# Patient Record
Sex: Female | Born: 1950 | Race: White | Hispanic: No | Smoking: Former smoker
Health system: Southern US, Community
[De-identification: ages and names within clinical notes are randomized; demographics above are authoritative.]

## PROBLEM LIST (undated history)

## (undated) DIAGNOSIS — I2510 Atherosclerotic heart disease of native coronary artery without angina pectoris: Secondary | ICD-10-CM

## (undated) DIAGNOSIS — I219 Acute myocardial infarction, unspecified: Secondary | ICD-10-CM

## (undated) DIAGNOSIS — C801 Malignant (primary) neoplasm, unspecified: Secondary | ICD-10-CM

## (undated) DIAGNOSIS — D689 Coagulation defect, unspecified: Secondary | ICD-10-CM

## (undated) DIAGNOSIS — I255 Ischemic cardiomyopathy: Secondary | ICD-10-CM

## (undated) DIAGNOSIS — Z800 Family history of malignant neoplasm of digestive organs: Secondary | ICD-10-CM

## (undated) DIAGNOSIS — D649 Anemia, unspecified: Secondary | ICD-10-CM

## (undated) DIAGNOSIS — Z965 Presence of tooth-root and mandibular implants: Secondary | ICD-10-CM

## (undated) DIAGNOSIS — K8020 Calculus of gallbladder without cholecystitis without obstruction: Secondary | ICD-10-CM

## (undated) DIAGNOSIS — Z720 Tobacco use: Secondary | ICD-10-CM

## (undated) DIAGNOSIS — M1990 Unspecified osteoarthritis, unspecified site: Secondary | ICD-10-CM

## (undated) DIAGNOSIS — F419 Anxiety disorder, unspecified: Secondary | ICD-10-CM

## (undated) DIAGNOSIS — F32A Depression, unspecified: Secondary | ICD-10-CM

## (undated) DIAGNOSIS — K5790 Diverticulosis of intestine, part unspecified, without perforation or abscess without bleeding: Secondary | ICD-10-CM

## (undated) DIAGNOSIS — Z808 Family history of malignant neoplasm of other organs or systems: Secondary | ICD-10-CM

## (undated) DIAGNOSIS — F329 Major depressive disorder, single episode, unspecified: Secondary | ICD-10-CM

## (undated) DIAGNOSIS — J189 Pneumonia, unspecified organism: Secondary | ICD-10-CM

## (undated) DIAGNOSIS — Z973 Presence of spectacles and contact lenses: Secondary | ICD-10-CM

## (undated) HISTORY — DX: Acute myocardial infarction, unspecified: I21.9

## (undated) HISTORY — DX: Family history of malignant neoplasm of digestive organs: Z80.0

## (undated) HISTORY — DX: Major depressive disorder, single episode, unspecified: F32.9

## (undated) HISTORY — DX: Depression, unspecified: F32.A

## (undated) HISTORY — PX: CHOLECYSTECTOMY: SHX55

## (undated) HISTORY — DX: Anemia, unspecified: D64.9

## (undated) HISTORY — DX: Pneumonia, unspecified organism: J18.9

## (undated) HISTORY — DX: Calculus of gallbladder without cholecystitis without obstruction: K80.20

## (undated) HISTORY — DX: Coagulation defect, unspecified: D68.9

## (undated) HISTORY — DX: Ischemic cardiomyopathy: I25.5

## (undated) HISTORY — DX: Malignant (primary) neoplasm, unspecified: C80.1

## (undated) HISTORY — DX: Diverticulosis of intestine, part unspecified, without perforation or abscess without bleeding: K57.90

## (undated) HISTORY — DX: Atherosclerotic heart disease of native coronary artery without angina pectoris: I25.10

## (undated) HISTORY — DX: Family history of malignant neoplasm of other organs or systems: Z80.8

---

## 1997-07-28 ENCOUNTER — Ambulatory Visit (HOSPITAL_COMMUNITY): Admission: RE | Admit: 1997-07-28 | Discharge: 1997-07-29 | Payer: Self-pay | Admitting: *Deleted

## 2003-03-02 ENCOUNTER — Other Ambulatory Visit: Admission: RE | Admit: 2003-03-02 | Discharge: 2003-03-02 | Payer: Self-pay | Admitting: Family Medicine

## 2003-06-08 ENCOUNTER — Observation Stay (HOSPITAL_COMMUNITY): Admission: EM | Admit: 2003-06-08 | Discharge: 2003-06-09 | Payer: Self-pay | Admitting: *Deleted

## 2006-05-14 ENCOUNTER — Other Ambulatory Visit: Admission: RE | Admit: 2006-05-14 | Discharge: 2006-05-14 | Payer: Self-pay | Admitting: Family Medicine

## 2006-08-12 ENCOUNTER — Emergency Department (HOSPITAL_COMMUNITY): Admission: EM | Admit: 2006-08-12 | Discharge: 2006-08-12 | Payer: Self-pay | Admitting: Emergency Medicine

## 2010-08-06 ENCOUNTER — Emergency Department (HOSPITAL_COMMUNITY): Payer: 59

## 2010-08-06 ENCOUNTER — Emergency Department (HOSPITAL_COMMUNITY)
Admission: EM | Admit: 2010-08-06 | Discharge: 2010-08-06 | Disposition: A | Discharge status: Home / Self Care | Payer: 59 | Attending: Emergency Medicine | Admitting: Emergency Medicine

## 2010-08-06 DIAGNOSIS — R000 Tachycardia, unspecified: Secondary | ICD-10-CM | POA: Insufficient documentation

## 2010-08-06 DIAGNOSIS — R110 Nausea: Secondary | ICD-10-CM | POA: Insufficient documentation

## 2010-08-06 DIAGNOSIS — F17200 Nicotine dependence, unspecified, uncomplicated: Secondary | ICD-10-CM | POA: Insufficient documentation

## 2010-08-06 DIAGNOSIS — M549 Dorsalgia, unspecified: Secondary | ICD-10-CM | POA: Insufficient documentation

## 2010-08-06 LAB — URINE MICROSCOPIC-ADD ON

## 2010-08-06 LAB — URINALYSIS, ROUTINE W REFLEX MICROSCOPIC
Ketones, ur: NEGATIVE mg/dL
Leukocytes, UA: NEGATIVE
Nitrite: NEGATIVE
Specific Gravity, Urine: 1.013 (ref 1.005–1.030)
pH: 5 (ref 5.0–8.0)

## 2010-10-02 ENCOUNTER — Other Ambulatory Visit: Payer: Self-pay | Admitting: Family Medicine

## 2010-10-02 ENCOUNTER — Other Ambulatory Visit (HOSPITAL_COMMUNITY)
Admission: RE | Admit: 2010-10-02 | Discharge: 2010-10-02 | Disposition: A | Discharge status: Home / Self Care | Payer: 59 | Source: Ambulatory Visit | Attending: Family Medicine | Admitting: Family Medicine

## 2010-10-02 DIAGNOSIS — Z124 Encounter for screening for malignant neoplasm of cervix: Secondary | ICD-10-CM | POA: Insufficient documentation

## 2010-10-15 ENCOUNTER — Other Ambulatory Visit: Payer: Self-pay | Admitting: Family Medicine

## 2010-10-15 DIAGNOSIS — Z1231 Encounter for screening mammogram for malignant neoplasm of breast: Secondary | ICD-10-CM

## 2010-10-23 ENCOUNTER — Ambulatory Visit
Admission: RE | Admit: 2010-10-23 | Discharge: 2010-10-23 | Disposition: A | Discharge status: Home / Self Care | Payer: 59 | Source: Ambulatory Visit | Attending: Family Medicine | Admitting: Family Medicine

## 2010-10-23 DIAGNOSIS — Z1231 Encounter for screening mammogram for malignant neoplasm of breast: Secondary | ICD-10-CM

## 2013-08-11 ENCOUNTER — Other Ambulatory Visit: Payer: Self-pay | Admitting: Physician Assistant

## 2014-06-10 ENCOUNTER — Encounter (HOSPITAL_COMMUNITY): Payer: Self-pay | Admitting: Emergency Medicine

## 2014-06-10 ENCOUNTER — Emergency Department (HOSPITAL_COMMUNITY)
Admission: EM | Admit: 2014-06-10 | Discharge: 2014-06-10 | Disposition: A | Discharge status: Home / Self Care | Payer: BLUE CROSS/BLUE SHIELD | Attending: Emergency Medicine | Admitting: Emergency Medicine

## 2014-06-10 DIAGNOSIS — R404 Transient alteration of awareness: Secondary | ICD-10-CM | POA: Diagnosis not present

## 2014-06-10 DIAGNOSIS — Z720 Tobacco use: Secondary | ICD-10-CM | POA: Insufficient documentation

## 2014-06-10 DIAGNOSIS — R55 Syncope and collapse: Secondary | ICD-10-CM | POA: Diagnosis not present

## 2014-06-10 DIAGNOSIS — F419 Anxiety disorder, unspecified: Secondary | ICD-10-CM | POA: Insufficient documentation

## 2014-06-10 DIAGNOSIS — R42 Dizziness and giddiness: Secondary | ICD-10-CM | POA: Diagnosis not present

## 2014-06-10 DIAGNOSIS — Z79899 Other long term (current) drug therapy: Secondary | ICD-10-CM | POA: Insufficient documentation

## 2014-06-10 DIAGNOSIS — R110 Nausea: Secondary | ICD-10-CM | POA: Insufficient documentation

## 2014-06-10 HISTORY — DX: Anxiety disorder, unspecified: F41.9

## 2014-06-10 LAB — BASIC METABOLIC PANEL
Anion gap: 6 (ref 5–15)
BUN: 18 mg/dL (ref 6–23)
CO2: 25 mmol/L (ref 19–32)
Calcium: 9.1 mg/dL (ref 8.4–10.5)
Chloride: 107 mmol/L (ref 96–112)
Creatinine, Ser: 0.8 mg/dL (ref 0.50–1.10)
GFR calc Af Amer: 88 mL/min — ABNORMAL LOW (ref >90)
GFR calc non Af Amer: 76 mL/min — ABNORMAL LOW (ref >90)
Glucose, Bld: 136 mg/dL — ABNORMAL HIGH (ref 70–99)
Potassium: 3.6 mmol/L (ref 3.5–5.1)
Sodium: 138 mmol/L (ref 135–145)

## 2014-06-10 LAB — CBC WITH DIFFERENTIAL/PLATELET
Basophils Absolute: 0.1 10*3/uL (ref 0.0–0.1)
Basophils Relative: 1 % (ref 0–1)
Eosinophils Absolute: 0 10*3/uL (ref 0.0–0.7)
Eosinophils Relative: 0 % (ref 0–5)
HCT: 44.3 % (ref 36.0–46.0)
Hemoglobin: 15.3 g/dL — ABNORMAL HIGH (ref 12.0–15.0)
Lymphocytes Relative: 20 % (ref 12–46)
Lymphs Abs: 1.3 10*3/uL (ref 0.7–4.0)
MCH: 33.1 pg (ref 26.0–34.0)
MCHC: 34.5 g/dL (ref 30.0–36.0)
MCV: 95.9 fL (ref 78.0–100.0)
Monocytes Absolute: 0.7 10*3/uL (ref 0.1–1.0)
Monocytes Relative: 10 % (ref 3–12)
Neutro Abs: 4.8 10*3/uL (ref 1.7–7.7)
Neutrophils Relative %: 69 % (ref 43–77)
Platelets: 233 10*3/uL (ref 150–400)
RBC: 4.62 MIL/uL (ref 3.87–5.11)
RDW: 13 % (ref 11.5–15.5)
WBC: 6.8 10*3/uL (ref 4.0–10.5)

## 2014-06-10 LAB — TROPONIN I: Troponin I: <0.03 ng/mL (ref <0.031)

## 2014-06-10 MED ORDER — SODIUM CHLORIDE 0.9 % IV BOLUS (SEPSIS)
1000.0000 mL | Freq: Once | INTRAVENOUS | Status: AC
  Administered 2014-06-10: 1000 mL via INTRAVENOUS

## 2014-06-10 NOTE — ED Notes (Signed)
Dr. Kohut at bedside 

## 2014-06-10 NOTE — ED Provider Notes (Signed)
CSN: [id number]     Arrival date & time 06/10/14  1044 History   First MD Initiated Contact with Patient 06/10/14 1046     Chief Complaint  Patient presents with  . Near Syncope     (Consider location/radiation/quality/duration/timing/severity/associated sxs/prior Treatment) HPI   63yF with near syncope. Onset just before arrival. Sitting down at desk at work when began to feel nauseated and that she might faint. Laid her head down and then felt flushed and then cold/clammy. Reports that co-worker reports she looked "ashen." Denies any pain during this. Symptoms slowly subsided over a few minutes. Now no complaints. Felt fine earlier in the day. Hx of anxiety but says this episode different. No symptoms with exertion over last several days.   Past Medical History  Diagnosis Date  . Anxiety    Past Surgical History  Procedure Laterality Date  . Cholecystectomy     No family history on file. History  Substance Use Topics  . Smoking status: Current Every Day Smoker -- 1.00 packs/day    Types: Cigarettes  . Smokeless tobacco: Not on file  . Alcohol Use: Yes     Comment: occasional   OB History    No data available     Review of Systems  All systems reviewed and negative, other than as noted in HPI.   Allergies  Review of patient's allergies indicates no known allergies.  Home Medications   Prior to Admission medications   Medication Sig Start Date End Date Taking? Authorizing Provider  FLUoxetine (PROZAC) 10 MG capsule Take 10 mg by mouth daily.   Yes Historical Provider, MD   BP 113/51 mmHg  Pulse 77  Temp(Src) 98.6 F (37 C) (Oral)  Resp 18  SpO2 96% Physical Exam  Constitutional: She is oriented to person, place, and time. She appears well-developed and well-nourished. No distress.  HENT:  Head: Normocephalic and atraumatic.  Eyes: Conjunctivae are normal. Right eye exhibits no discharge. Left eye exhibits no discharge.  Neck: Neck supple.  Cardiovascular:  Normal rate, regular rhythm and normal heart sounds.  Exam reveals no gallop and no friction rub.   No murmur heard. Pulmonary/Chest: Effort normal and breath sounds normal. No respiratory distress.  Abdominal: Soft. She exhibits no distension. There is no tenderness.  Musculoskeletal: She exhibits no edema or tenderness.  Lower extremities symmetric as compared to each other. No calf tenderness. Negative Homan's. No palpable cords.   Neurological: She is alert and oriented to person, place, and time. No cranial nerve deficit. She exhibits normal muscle tone. Coordination normal.  Good finger to nose b/l. Gait steady.   Skin: Skin is warm and dry.  Psychiatric: She has a normal mood and affect. Her behavior is normal. Thought content normal.  Nursing note and vitals reviewed.   ED Course  Procedures (including critical care time) Labs Review Labs Reviewed - No data to display  Imaging Review No results found.   EKG Interpretation   Date/Time:  Friday June 10 2014 10:45:08 EST Ventricular Rate:  75 PR Interval:  123 QRS Duration: 85 QT Interval:  379 QTC Calculation: 423 R Axis:   52 Text Interpretation:  Sinus arrhythmia No significant change since last  tracing Confirmed by Wilson Singer  MD, Keller ([id number]) on 06/10/2014 12:09:59 PM      MDM   Final diagnoses:  Near syncope    63yF with near syncopal symptoms. Now symptom free. Consistent with vasovagal. W/u fairly unrevealing. Low suspicion for emergent process.  Virgel Manifold, MD 06/16/14 [phone]

## 2014-06-10 NOTE — ED Notes (Signed)
Pt is stable upon d/c and ambulates from ED escorted by ED staff.

## 2014-06-10 NOTE — ED Notes (Signed)
Pt states she has been having waves of lightheadedness. Pt states she is currently having some lightheadedness.

## 2014-06-10 NOTE — ED Notes (Signed)
Pt arrived from work by [employer] with c/o near syncopal episode. Pt stated that she started feeling faint while sitting at desk and had some numbness to toes and left shoulder pain. Pt was also nauseated, clammy, and hot feeling. Pt stated that she has been having some stress at work and takes Prozac for anxiety. EMS initial BP 174/99 HR100 Last BP 130/78 HR-84 Currently symptoms have resolved.

## 2014-06-10 NOTE — Discharge Instructions (Signed)
Near-Syncope Near-syncope (commonly known as near fainting) is sudden weakness, dizziness, or feeling like you might pass out. During an episode of near-syncope, you may also develop pale skin, have tunnel vision, or feel sick to your stomach (nauseous). Near-syncope may occur when getting up after sitting or while standing for a long time. It is caused by a sudden decrease in blood flow to the brain. This decrease can result from various causes or triggers, most of which are not serious. However, because near-syncope can sometimes be a sign of something serious, a medical evaluation is required. The specific cause is often not determined. HOME CARE INSTRUCTIONS  Monitor your condition for any changes. The following actions may help to alleviate any discomfort you are experiencing:  Have someone stay with you until you feel stable.  Lie down right away and prop your feet up if you start feeling like you might faint. Breathe deeply and steadily. Wait until all the symptoms have passed. Most of these episodes last only a few minutes. You may feel tired for several hours.   Drink enough fluids to keep your urine clear or pale yellow.   If you are taking blood pressure or heart medicine, get up slowly when seated or lying down. Take several minutes to sit and then stand. This can reduce dizziness.  Follow up with your health care provider as directed. SEEK IMMEDIATE MEDICAL CARE IF:   You have a severe headache.   You have unusual pain in the chest, abdomen, or back.   You are bleeding from the mouth or rectum, or you have black or tarry stool.   You have an irregular or very fast heartbeat.   You have repeated fainting or have seizure-like jerking during an episode.   You faint when sitting or lying down.   You have confusion.   You have difficulty walking.   You have severe weakness.   You have vision problems.  MAKE SURE YOU:   Understand these instructions.  Will  watch your condition.  Will get help right away if you are not doing well or get worse. Document Released: 04/08/2005 Document Revised: 04/13/2013 Document Reviewed: 09/11/2012 ExitCare Patient Information 2015 [employer]. This information is not intended to replace advice given to you by your health care provider. Make sure you discuss any questions you have with your health care provider.  

## 2015-04-28 DIAGNOSIS — J029 Acute pharyngitis, unspecified: Secondary | ICD-10-CM | POA: Diagnosis not present

## 2015-04-28 DIAGNOSIS — H6591 Unspecified nonsuppurative otitis media, right ear: Secondary | ICD-10-CM | POA: Diagnosis not present

## 2015-08-31 DIAGNOSIS — M25551 Pain in right hip: Secondary | ICD-10-CM | POA: Diagnosis not present

## 2015-08-31 DIAGNOSIS — M461 Sacroiliitis, not elsewhere classified: Secondary | ICD-10-CM | POA: Diagnosis not present

## 2015-08-31 DIAGNOSIS — E559 Vitamin D deficiency, unspecified: Secondary | ICD-10-CM | POA: Diagnosis not present

## 2015-08-31 DIAGNOSIS — M5431 Sciatica, right side: Secondary | ICD-10-CM | POA: Diagnosis not present

## 2015-08-31 DIAGNOSIS — E538 Deficiency of other specified B group vitamins: Secondary | ICD-10-CM | POA: Diagnosis not present

## 2015-09-01 DIAGNOSIS — H3540 Unspecified peripheral retinal degeneration: Secondary | ICD-10-CM | POA: Diagnosis not present

## 2015-09-01 DIAGNOSIS — H2513 Age-related nuclear cataract, bilateral: Secondary | ICD-10-CM | POA: Diagnosis not present

## 2016-06-11 ENCOUNTER — Ambulatory Visit
Admission: RE | Admit: 2016-06-11 | Discharge: 2016-06-11 | Disposition: A | Discharge status: Home / Self Care | Payer: Medicare Other | Source: Ambulatory Visit | Attending: Sports Medicine | Admitting: Sports Medicine

## 2016-06-11 ENCOUNTER — Ambulatory Visit (INDEPENDENT_AMBULATORY_CARE_PROVIDER_SITE_OTHER): Payer: Medicare Other | Admitting: Sports Medicine

## 2016-06-11 VITALS — BP 119/64 | Ht 64.5 in | Wt 165.0 lb

## 2016-06-11 DIAGNOSIS — M5442 Lumbago with sciatica, left side: Secondary | ICD-10-CM

## 2016-06-11 DIAGNOSIS — M5136 Other intervertebral disc degeneration, lumbar region: Secondary | ICD-10-CM | POA: Diagnosis not present

## 2016-06-11 DIAGNOSIS — M47816 Spondylosis without myelopathy or radiculopathy, lumbar region: Secondary | ICD-10-CM | POA: Diagnosis not present

## 2016-06-11 MED ORDER — AMITRIPTYLINE HCL 25 MG PO TABS: 25.0000 mg | ORAL_TABLET | Freq: Every day | ORAL | 1 refills | Status: DC

## 2016-06-11 NOTE — Progress Notes (Signed)
  MARDEAN MAYERSON - 66 y.o. female MRN [id number]  Date of birth: 1951-03-24  SUBJECTIVE:  Including CC & ROS.  No chief complaint on file.  Ms. Felipe is a 66 yo F w/ questionable PMH of L. Sided sciatica in for evaluation of R. Hip pain on referral from Dr. Yaakov Guthrie. Describes intermittent 8/10 "dull ache" of lateral hip radiating into groin of 3 months duration. Denies inciting event or trauma. Reports worsening of symptoms when rising from chair or with exercise. Pain improved w/ NSAIDs. Reports associated weakness to hip flexion w/ periodic "tingling/numbness" over R. Knee.   Pt denies h/o arthritis in other joints.    HISTORY: Past Medical, Surgical, Social, and Family History Reviewed & Updated per EMR.   Pertinent Historical Findings include: R. Sided sciatica Cervical Spine DDD Osteopenia  DATA REVIEWED: XR R. Hip (5/17): mild degenerative changes w/o acute pathology  PHYSICAL EXAM:  VS: BP:119/64  HR: bpm  TEMP: ( )  RESP:   HT:5' 4.5" (163.8 cm)   WT:165 lb (74.8 kg)  BMI:27.9 PHYSICAL EXAM:  General: well-appearing F in NAD, A&Ox3  MSK: - Full/non-painful range of motion to hip flexion/extension/internal rotation/external rotation, leg flexion/extension - no pain when loading and internally rotating L. hip - 5/5 strength to hip flexion/extension, leg flexion/extension b/l - 4/5 strength to hip abduction b/l - 2+ patellar reflexes b/l - Negative SLR - nl pelvic tilt, negative FABER  ASSESSMENT & PLAN: See problem based charting & AVS for pt instructions. 66 yo w/ PMH of r. Sided sciatica in with R. Hip/groin pain w/ radicular symptoms and signs of abductor weakness on exam concerning for L. Sided sciatica. Previous h/o sciatica concerning for DDD w/o recent imaging. Will manage radicular symptoms now and get imaging today.  - AP/Lateral XR lumbar spine - Amitriptyline 25mg  once nightly - Home exercises including hip abduction, knee-to-chest, and bridges - F/U  in 1 month

## 2016-06-14 DIAGNOSIS — M5136 Other intervertebral disc degeneration, lumbar region: Secondary | ICD-10-CM | POA: Insufficient documentation

## 2016-07-25 ENCOUNTER — Encounter: Payer: Self-pay | Admitting: Sports Medicine

## 2016-07-25 ENCOUNTER — Ambulatory Visit (INDEPENDENT_AMBULATORY_CARE_PROVIDER_SITE_OTHER): Payer: Medicare Other | Admitting: Sports Medicine

## 2016-07-25 DIAGNOSIS — M5136 Other intervertebral disc degeneration, lumbar region: Secondary | ICD-10-CM | POA: Diagnosis present

## 2016-07-25 NOTE — Progress Notes (Signed)
[name] Progress Note  Subjective:  Carol Ferguson is a 66 y.o. female who presents for follow-up of back pain. She was seen 06/11/16 at [facility] and thought to have R hip and groin neuropathic pain with bilateral hip abductor weakness, concerning for DDD with sciatica. Lumbar spine x-ray showed degenerative changes with disc and facet disease, especially L1-L3, exaggerated lordosis, and spurring. Since that visit, pain has improved a lot. She took amitriptyline 25 mg QHS for 2 weeks which helped "electric shock" feeling into groin dramatically--now only has sensation every once in a while rather than multiple times daily. She stopped taking this, however, because it made her feel a little "off balance" in the morning. She has been doing home hip abduction exercises about 3x per week.  She is taking naproxen only about 2x a week, compared to daily previously. She does complain that back feels like it "catches" when trying to straighten up after exerting herself more, e.g., spring cleaning.   ROS: No bowel or bladder incontinence, no falls  No Known Allergies  Objective: BP (!) 112/44   Ht 5\' 5"  (1.651 m)   Wt 164 lb (74.4 kg)   BMI 27.29 kg/m   Constitutional: Older female in NAD Pulmonary/Chest: No respiratory distress.  Musculoskeletal: FROM with hip inversion and eversion bilaterally. No midline spinal TTP. Negative FABER.  Neurological: Sensation intact. Hip abduction strength 5/5 bilaterally (improved from last visit). 5/5 strength with leg extension.  Psychiatric: Normal mood and affect.  Vitals reviewed  Assessment/Plan: Lumbar degenerative disc disease - Improved pain since last visit, suspect 2/2 HEP. Anticipate further improvement if continues and increases frequency of exercises, at least 3x a week for hip and back exercises.  - Recommended adding pelvic tip, knee-to-chest, knee-to-opposite-shoulder, and sit ups to her regimen - Recommended incorporating daily  exercise like walking. - Consider taichi, yoga, or pilates for low back flexibility - Take amitriptyline and naproxen prn  Follow-up in about 6 weeks to assess for improvement. Could discuss tracking activity with fitbit at that time.   Olene Floss, MD [facility], PGY-2  I observed and examined the patient with the resident and agree with assessment and plan.  Note reviewed and modified by me. Stefanie Libel, MD

## 2016-07-25 NOTE — Assessment & Plan Note (Addendum)
-   Improved pain since last visit, suspect 2/2 HEP. Anticipate further improvement if continues and increases frequency of exercises, at least 3x a week for hip and back exercises.  - Recommended adding pelvic tip, knee-to-chest, knee-to-opposite-shoulder, and sit ups to her regimen - Recommended incorporating daily exercise like walking. - Consider taichi, yoga, or pilates for low back flexibility - Take amitriptyline and naproxen prn

## 2016-08-14 ENCOUNTER — Other Ambulatory Visit: Payer: Self-pay | Admitting: *Deleted

## 2016-08-14 DIAGNOSIS — M5442 Lumbago with sciatica, left side: Secondary | ICD-10-CM

## 2016-08-14 MED ORDER — AMITRIPTYLINE HCL 25 MG PO TABS: 25.0000 mg | ORAL_TABLET | Freq: Every day | ORAL | 1 refills | Status: DC

## 2016-09-26 ENCOUNTER — Inpatient Hospital Stay (HOSPITAL_COMMUNITY)
Admission: EM | Admit: 2016-09-26 | Discharge: 2016-09-28 | DRG: 246 | Disposition: A | Discharge status: Home / Self Care | Payer: Medicare Other | Attending: Interventional Cardiology | Admitting: Interventional Cardiology

## 2016-09-26 ENCOUNTER — Encounter (HOSPITAL_COMMUNITY): Payer: Self-pay | Admitting: Emergency Medicine

## 2016-09-26 ENCOUNTER — Encounter (HOSPITAL_COMMUNITY)
Admission: EM | Disposition: A | Discharge status: Home / Self Care | Payer: Self-pay | Source: Home / Self Care | Attending: Interventional Cardiology

## 2016-09-26 ENCOUNTER — Ambulatory Visit (HOSPITAL_COMMUNITY): Admit: 2016-09-26 | Payer: Medicare Other | Admitting: Interventional Cardiology

## 2016-09-26 DIAGNOSIS — I2510 Atherosclerotic heart disease of native coronary artery without angina pectoris: Secondary | ICD-10-CM | POA: Diagnosis not present

## 2016-09-26 DIAGNOSIS — I2129 ST elevation (STEMI) myocardial infarction involving other sites: Secondary | ICD-10-CM | POA: Diagnosis present

## 2016-09-26 DIAGNOSIS — Z720 Tobacco use: Secondary | ICD-10-CM

## 2016-09-26 DIAGNOSIS — I5031 Acute diastolic (congestive) heart failure: Secondary | ICD-10-CM | POA: Diagnosis present

## 2016-09-26 DIAGNOSIS — I2119 ST elevation (STEMI) myocardial infarction involving other coronary artery of inferior wall: Secondary | ICD-10-CM | POA: Diagnosis not present

## 2016-09-26 DIAGNOSIS — M25519 Pain in unspecified shoulder: Secondary | ICD-10-CM | POA: Diagnosis not present

## 2016-09-26 DIAGNOSIS — F17210 Nicotine dependence, cigarettes, uncomplicated: Secondary | ICD-10-CM | POA: Diagnosis not present

## 2016-09-26 DIAGNOSIS — R079 Chest pain, unspecified: Secondary | ICD-10-CM | POA: Diagnosis not present

## 2016-09-26 DIAGNOSIS — F419 Anxiety disorder, unspecified: Secondary | ICD-10-CM | POA: Diagnosis present

## 2016-09-26 DIAGNOSIS — E785 Hyperlipidemia, unspecified: Secondary | ICD-10-CM

## 2016-09-26 DIAGNOSIS — Z23 Encounter for immunization: Secondary | ICD-10-CM | POA: Diagnosis not present

## 2016-09-26 DIAGNOSIS — Z8249 Family history of ischemic heart disease and other diseases of the circulatory system: Secondary | ICD-10-CM | POA: Diagnosis not present

## 2016-09-26 DIAGNOSIS — Z9049 Acquired absence of other specified parts of digestive tract: Secondary | ICD-10-CM

## 2016-09-26 DIAGNOSIS — I2121 ST elevation (STEMI) myocardial infarction involving left circumflex coronary artery: Secondary | ICD-10-CM | POA: Diagnosis not present

## 2016-09-26 DIAGNOSIS — I5032 Chronic diastolic (congestive) heart failure: Secondary | ICD-10-CM

## 2016-09-26 DIAGNOSIS — I252 Old myocardial infarction: Secondary | ICD-10-CM

## 2016-09-26 DIAGNOSIS — M5136 Other intervertebral disc degeneration, lumbar region: Secondary | ICD-10-CM | POA: Diagnosis present

## 2016-09-26 DIAGNOSIS — I219 Acute myocardial infarction, unspecified: Secondary | ICD-10-CM

## 2016-09-26 DIAGNOSIS — Z955 Presence of coronary angioplasty implant and graft: Secondary | ICD-10-CM

## 2016-09-26 DIAGNOSIS — R52 Pain, unspecified: Secondary | ICD-10-CM | POA: Diagnosis not present

## 2016-09-26 DIAGNOSIS — I213 ST elevation (STEMI) myocardial infarction of unspecified site: Secondary | ICD-10-CM | POA: Diagnosis not present

## 2016-09-26 HISTORY — PX: LEFT HEART CATH AND CORONARY ANGIOGRAPHY: CATH118249

## 2016-09-26 HISTORY — DX: Acute myocardial infarction, unspecified: I21.9

## 2016-09-26 HISTORY — PX: CORONARY STENT INTERVENTION: CATH118234

## 2016-09-26 HISTORY — DX: Tobacco use: Z72.0

## 2016-09-26 LAB — CBC
HCT: 43.9 % (ref 36.0–46.0)
HEMOGLOBIN: 15 g/dL (ref 12.0–15.0)
MCH: 33.4 pg (ref 26.0–34.0)
MCHC: 34.2 g/dL (ref 30.0–36.0)
MCV: 97.8 fL (ref 78.0–100.0)
Platelets: 193 10*3/uL (ref 150–400)
RBC: 4.49 MIL/uL (ref 3.87–5.11)
RDW: 12.8 % (ref 11.5–15.5)
WBC: 10 10*3/uL (ref 4.0–10.5)

## 2016-09-26 LAB — CREATININE, SERUM
CREATININE: 0.83 mg/dL (ref 0.44–1.00)
GFR calc non Af Amer: >60 mL/min (ref >60)

## 2016-09-26 LAB — POCT ACTIVATED CLOTTING TIME: ACTIVATED CLOTTING TIME: 362 s

## 2016-09-26 LAB — TSH: TSH: 0.999 u[IU]/mL (ref 0.350–4.500)

## 2016-09-26 LAB — MRSA PCR SCREENING: MRSA by PCR: NEGATIVE

## 2016-09-26 LAB — TROPONIN I
TROPONIN I: 34.78 ng/mL — AB (ref <0.03)
Troponin I: 2.35 ng/mL (ref <0.03)

## 2016-09-26 SURGERY — LEFT HEART CATH AND CORONARY ANGIOGRAPHY
Anesthesia: LOCAL

## 2016-09-26 MED ORDER — ACETAMINOPHEN 325 MG PO TABS: 650.0000 mg | ORAL_TABLET | ORAL | Status: DC | PRN

## 2016-09-26 MED ORDER — NITROGLYCERIN 1 MG/10 ML FOR IR/CATH LAB
INTRA_ARTERIAL | Status: AC
  Filled 2016-09-26: qty 10

## 2016-09-26 MED ORDER — FENTANYL CITRATE (PF) 100 MCG/2ML IJ SOLN
INTRAMUSCULAR | Status: AC
  Filled 2016-09-26: qty 2

## 2016-09-26 MED ORDER — HEPARIN SODIUM (PORCINE) 1000 UNIT/ML IJ SOLN
INTRAMUSCULAR | Status: AC
  Filled 2016-09-26: qty 1

## 2016-09-26 MED ORDER — ASPIRIN 81 MG PO CHEW
81.0000 mg | CHEWABLE_TABLET | Freq: Every day | ORAL | Status: DC
  Administered 2016-09-26 – 2016-09-28 (×3): 81 mg via ORAL
  Filled 2016-09-26 (×3): qty 1

## 2016-09-26 MED ORDER — ONDANSETRON HCL 4 MG/2ML IJ SOLN: 4.0000 mg | Freq: Four times a day (QID) | INTRAMUSCULAR | Status: DC | PRN

## 2016-09-26 MED ORDER — IOPAMIDOL (ISOVUE-370) INJECTION 76%
INTRAVENOUS | Status: AC
  Filled 2016-09-26: qty 100

## 2016-09-26 MED ORDER — TIROFIBAN HCL IN NACL 5-0.9 MG/100ML-% IV SOLN
INTRAVENOUS | Status: AC
  Filled 2016-09-26: qty 100

## 2016-09-26 MED ORDER — NITROGLYCERIN 0.4 MG SL SUBL: 0.4000 mg | SUBLINGUAL_TABLET | SUBLINGUAL | Status: DC | PRN

## 2016-09-26 MED ORDER — TICAGRELOR 90 MG PO TABS
ORAL_TABLET | ORAL | Status: AC
  Filled 2016-09-26: qty 2

## 2016-09-26 MED ORDER — SODIUM CHLORIDE 0.9 % IV SOLN: 250.0000 mL | INTRAVENOUS | Status: DC | PRN

## 2016-09-26 MED ORDER — PNEUMOCOCCAL VAC POLYVALENT 25 MCG/0.5ML IJ INJ
0.5000 mL | INJECTION | INTRAMUSCULAR | Status: AC
  Administered 2016-09-28: 0.5 mL via INTRAMUSCULAR
  Filled 2016-09-26: qty 0.5

## 2016-09-26 MED ORDER — ATORVASTATIN CALCIUM 80 MG PO TABS
80.0000 mg | ORAL_TABLET | Freq: Every day | ORAL | Status: DC
  Administered 2016-09-26: 80 mg via ORAL
  Filled 2016-09-26: qty 1

## 2016-09-26 MED ORDER — ALPRAZOLAM 0.5 MG PO TABS: 1.0000 mg | ORAL_TABLET | Freq: Two times a day (BID) | ORAL | Status: DC | PRN

## 2016-09-26 MED ORDER — SODIUM CHLORIDE 0.9 % IV SOLN
INTRAVENOUS | Status: AC | PRN
  Administered 2016-09-26: 200 mL/h via INTRAVENOUS
  Administered 2016-09-26: 250 mL

## 2016-09-26 MED ORDER — IOPAMIDOL (ISOVUE-370) INJECTION 76%
INTRAVENOUS | Status: AC
  Filled 2016-09-26: qty 50

## 2016-09-26 MED ORDER — SODIUM CHLORIDE 0.9% FLUSH: 3.0000 mL | INTRAVENOUS | Status: DC | PRN

## 2016-09-26 MED ORDER — HEPARIN SODIUM (PORCINE) 5000 UNIT/ML IJ SOLN
5000.0000 [IU] | Freq: Three times a day (TID) | INTRAMUSCULAR | Status: DC
  Administered 2016-09-26 – 2016-09-28 (×5): 5000 [IU] via SUBCUTANEOUS
  Filled 2016-09-26 (×4): qty 1

## 2016-09-26 MED ORDER — HEPARIN SODIUM (PORCINE) 5000 UNIT/ML IJ SOLN
4000.0000 [IU] | Freq: Once | INTRAMUSCULAR | Status: AC
  Administered 2016-09-26: 4000 [IU] via INTRAVENOUS

## 2016-09-26 MED ORDER — TIROFIBAN (AGGRASTAT) BOLUS VIA INFUSION
INTRAVENOUS | Status: DC | PRN
  Administered 2016-09-26: 1850 ug via INTRAVENOUS

## 2016-09-26 MED ORDER — TICAGRELOR 90 MG PO TABS
90.0000 mg | ORAL_TABLET | Freq: Two times a day (BID) | ORAL | Status: DC
  Administered 2016-09-26 – 2016-09-28 (×4): 90 mg via ORAL
  Filled 2016-09-26 (×4): qty 1

## 2016-09-26 MED ORDER — MIDAZOLAM HCL 2 MG/2ML IJ SOLN
INTRAMUSCULAR | Status: DC | PRN
  Administered 2016-09-26: 1 mg via INTRAVENOUS

## 2016-09-26 MED ORDER — ASPIRIN EC 81 MG PO TBEC: 81.0000 mg | DELAYED_RELEASE_TABLET | Freq: Every day | ORAL | Status: DC

## 2016-09-26 MED ORDER — LIDOCAINE HCL 1 % IJ SOLN
INTRAMUSCULAR | Status: AC
  Filled 2016-09-26: qty 20

## 2016-09-26 MED ORDER — CARVEDILOL 3.125 MG PO TABS
3.1250 mg | ORAL_TABLET | Freq: Two times a day (BID) | ORAL | Status: DC
  Administered 2016-09-26 – 2016-09-27 (×2): 3.125 mg via ORAL
  Filled 2016-09-26 (×3): qty 1

## 2016-09-26 MED ORDER — LIDOCAINE HCL (PF) 1 % IJ SOLN
INTRAMUSCULAR | Status: DC | PRN
  Administered 2016-09-26: 2 mL via INTRADERMAL

## 2016-09-26 MED ORDER — HEPARIN (PORCINE) IN NACL 2-0.9 UNIT/ML-% IJ SOLN
INTRAMUSCULAR | Status: AC
  Filled 2016-09-26: qty 500

## 2016-09-26 MED ORDER — SODIUM CHLORIDE 0.9% FLUSH
3.0000 mL | Freq: Two times a day (BID) | INTRAVENOUS | Status: DC
  Administered 2016-09-26 – 2016-09-27 (×2): 3 mL via INTRAVENOUS

## 2016-09-26 MED ORDER — VERAPAMIL HCL 2.5 MG/ML IV SOLN
INTRAVENOUS | Status: AC
  Filled 2016-09-26: qty 2

## 2016-09-26 MED ORDER — IOPAMIDOL (ISOVUE-370) INJECTION 76%
INTRAVENOUS | Status: DC | PRN
  Administered 2016-09-26: 110 mL via INTRA_ARTERIAL

## 2016-09-26 MED ORDER — SODIUM CHLORIDE 0.9 % WEIGHT BASED INFUSION: 1.0000 mL/kg/h | INTRAVENOUS | Status: AC

## 2016-09-26 MED ORDER — MIDAZOLAM HCL 2 MG/2ML IJ SOLN
INTRAMUSCULAR | Status: AC
  Filled 2016-09-26: qty 2

## 2016-09-26 MED ORDER — HEPARIN (PORCINE) IN NACL 2-0.9 UNIT/ML-% IJ SOLN
INTRAMUSCULAR | Status: AC | PRN
  Administered 2016-09-26: 1000 mL

## 2016-09-26 MED ORDER — HYDRALAZINE HCL 20 MG/ML IJ SOLN: 5.0000 mg | INTRAMUSCULAR | Status: AC | PRN

## 2016-09-26 MED ORDER — NITROGLYCERIN 1 MG/10 ML FOR IR/CATH LAB
INTRA_ARTERIAL | Status: DC | PRN
  Administered 2016-09-26: 200 ug via INTRACORONARY

## 2016-09-26 MED ORDER — VERAPAMIL HCL 2.5 MG/ML IV SOLN
INTRAVENOUS | Status: DC | PRN
  Administered 2016-09-26: 10 mL via INTRA_ARTERIAL

## 2016-09-26 MED ORDER — LABETALOL HCL 5 MG/ML IV SOLN: 10.0000 mg | INTRAVENOUS | Status: AC | PRN

## 2016-09-26 MED ORDER — TIROFIBAN HCL IN NACL 5-0.9 MG/100ML-% IV SOLN
INTRAVENOUS | Status: DC | PRN
  Administered 2016-09-26: 0.15 ug/kg/min via INTRAVENOUS

## 2016-09-26 MED ORDER — HEPARIN SODIUM (PORCINE) 1000 UNIT/ML IJ SOLN
INTRAMUSCULAR | Status: DC | PRN
Start: 2016-09-26 — End: 2016-09-26
  Administered 2016-09-26: 2000 [IU] via INTRAVENOUS

## 2016-09-26 MED ORDER — TICAGRELOR 90 MG PO TABS
ORAL_TABLET | ORAL | Status: DC | PRN
  Administered 2016-09-26: 180 mg via ORAL

## 2016-09-26 MED ORDER — FENTANYL CITRATE (PF) 100 MCG/2ML IJ SOLN
INTRAMUSCULAR | Status: DC | PRN
  Administered 2016-09-26 (×2): 50 ug via INTRAVENOUS

## 2016-09-26 MED ORDER — TIROFIBAN HCL IN NACL 5-0.9 MG/100ML-% IV SOLN: 0.0750 ug/kg/min | INTRAVENOUS | Status: DC

## 2016-09-26 MED ORDER — SODIUM CHLORIDE 0.9 % IV SOLN
INTRAVENOUS | Status: DC | PRN
  Administered 2016-09-26: 20 mL/h via INTRAVENOUS

## 2016-09-26 MED ORDER — OXYCODONE-ACETAMINOPHEN 5-325 MG PO TABS: 1.0000 | ORAL_TABLET | ORAL | Status: DC | PRN

## 2016-09-26 SURGICAL SUPPLY — 19 items
BALLN EMERGE MR 2.25X12 (BALLOONS) ×2
BALLOON EMERGE MR 2.25X12 (BALLOONS) ×1 IMPLANT
CATH INFINITI 5 FR JL3.5 (CATHETERS) ×2 IMPLANT
CATH INFINITI JR4 5F (CATHETERS) ×2 IMPLANT
CATH VISTA GUIDE 6FR JR4 (CATHETERS) IMPLANT
CATH VISTA GUIDE 6FR XB3 (CATHETERS) ×2 IMPLANT
COVER PRB 48X5XTLSCP FOLD TPE (BAG) ×1 IMPLANT
COVER PROBE 5X48 (BAG) ×1
DEVICE RAD COMP TR BAND LRG (VASCULAR PRODUCTS) ×2 IMPLANT
GLIDESHEATH SLEND A-KIT 6F 22G (SHEATH) ×2 IMPLANT
GUIDEWIRE INQWIRE 1.5J.035X260 (WIRE) ×1 IMPLANT
INQWIRE 1.5J .035X260CM (WIRE) ×2
KIT ENCORE 26 ADVANTAGE (KITS) ×2 IMPLANT
KIT HEART LEFT (KITS) ×2 IMPLANT
PACK CARDIAC CATHETERIZATION (CUSTOM PROCEDURE TRAY) ×2 IMPLANT
STENT RESOLUTE ONYX 2.5X15 (Permanent Stent) ×2 IMPLANT
TRANSDUCER W/STOPCOCK (MISCELLANEOUS) ×2 IMPLANT
TUBING CIL FLEX 10 FLL-RA (TUBING) ×2 IMPLANT
WIRE ASAHI PROWATER 180CM (WIRE) ×2 IMPLANT

## 2016-09-26 NOTE — Progress Notes (Signed)
CRITICAL VALUE ALERT  Critical Value:  Trop 34.78   Date & Time Notied: 6/7 2000  Provider Notified:yes  Orders Received/Actions taken: D/C serial Trop

## 2016-09-26 NOTE — ED Notes (Signed)
Pt from home via [facility] with c/o STEMI.  EMS reports a tight burning between shoulder blades starting last night around 2100 which resolved around 0100.  Pt woke this morning with the pain returned 10/10.  Given 324 mg Aspirin, 600 mL NS, and 2 nitro with decrease in pain to 5/10.  Lungs clear.  Pt was in ED room long enough to give heparin.  No EKG, vitals, or blood drawn while in room.

## 2016-09-26 NOTE — H&P (Signed)
The patient has been seen in conjunction with Carol Bhagat, PAC. All aspects of care have been considered and discussed. The patient has been personally interviewed, examined, and all clinical data has been reviewed.   Met patient in the Emergency ambulance bay. Onset of CP 9 PM , waxing and waning for 2 hours before resolving. Recurred this morning at 6:30 AM upon getting out of bed. In ER c/o severe interscapular pain of burning quality. ECG c/w inferolateral injury current.  No major medical problems but does smoke 1 PPD. On no medications, but has chronic recurrent low back pain.  Moderate bradycardia at 50 bpm, BP 118/75 mmHg. Overall appearance is anxiety and severe ongoing discomfort. Normal exam with equal pulses in all positions bilaterally.. Labs drawn and pending.  Impression is of stuttering inferolateral STEMI with bradycardia.  Plan is emergency cath and mechanical reperfusion if indicated. Cath lab not immediately available. Heparin, aspirin, and IV fluid bolus given in ER   Critical care time 45 minutes  Patient ID: Carol Ferguson MRN: [id number], DOB/AGE: 01-20-1951   Admit date: 09/26/2016   Primary Physician: Patient, No Pcp Per Primary Cardiologist: New to Dr. Tamala Julian  Pt. Profile:  Carol Ferguson is a 66 y.o. female with no PMH except ongoing tobacco abuse for >30 years (smokes 1/2 pack/da)   who presented by EMS with code STEMI.   HPI: As above. Not takes any medications. Family hx of CAD with MI to mother and brother. She started to having indigestion like pain last night around 9 pm radiating to her back/shoulder blade. Resolved. Then intermittently lasted all night. She work up this morning with similar chest discomfort but radiation was worse. Found to have inferior STEMI by EMS-> given ASA 358m and SL nitro x 2. In ER she received heparin bolus. She continued to have burning pain with radiating to her shoulder blade in cath lab.   EKG showed sinus rhythm with  inferior ST elevation with anterior depression.   Problem List  Past Medical History:  Diagnosis Date  . Anxiety   . Tobacco abuse     Past Surgical History:  Procedure Laterality Date  . CHOLECYSTECTOMY    . CORONARY STENT INTERVENTION N/A 09/26/2016   Procedure: Coronary Stent Intervention;  Surgeon: SBelva Crome MD;  [facility];  Service: Cardiovascular;  Laterality: N/A;  . LEFT HEART CATH AND CORONARY ANGIOGRAPHY N/A 09/26/2016   Procedure: Left Heart Cath and Coronary Angiography;  Surgeon: SBelva Crome MD;  [facility];  Service: Cardiovascular;  Laterality: N/A;     Allergies  No Known Allergies   Home Medications  Prior to Admission medications   Medication Sig Start Date End Date Taking? Authorizing Provider  amitriptyline (ELAVIL) 25 MG tablet Take 1 tablet (25 mg total) by mouth at bedtime. 08/14/16   FStefanie Libel MD  FLUoxetine (PROZAC) 10 MG capsule Take 10 mg by mouth daily.    [provider]    Family History  Family History  Problem Relation Age of Onset  . Heart attack Mother   . Heart attack Brother    Family Status  Relation Status  . Mother (Not Specified)  . Brother (Not Specified)    Social History  Social History   Social History  . Marital status: Divorced    Spouse name: N/A  . Number of children: N/A  . Years of education: N/A   Occupational History  . Not on file.  Social History Main Topics  . Smoking status: Current Every Day Smoker    Packs/day: 1.00    Types: Cigarettes  . Smokeless tobacco: Never Used  . Alcohol use Yes     Comment: occasional  . Drug use: No  . Sexual activity: Not on file   Other Topics Concern  . Not on file   Social History Narrative  . No narrative on file     All other systems reviewed and are otherwise negative except as noted above.  Physical Exam  Blood pressure (!) 108/53, pulse 67, temperature 98.5 F (36.9 C), temperature source  Oral, resp. rate 20, weight 160 lb 11.5 oz (72.9 kg), SpO2 97 %.  General: Pleasant, NAD Psych: Normal affect. Neuro: Alert and oriented X 3. Moves all extremities spontaneously. HEENT: Normal  Neck: Supple without bruits or JVD. Lungs:  Resp regular and unlabored, CTA. Heart: RRR no s3, s4, or murmurs. Abdomen: Soft, non-tender, non-distended, BS + x 4.  Extremities: No clubbing, cyanosis or edema. DP/PT/Radials 2+ and equal bilaterally.  Labs   Recent Labs  09/26/16 1253  TROPONINI 2.35*   Lab Results  Component Value Date   WBC 10.0 09/26/2016   HGB 15.0 09/26/2016   HCT 43.9 09/26/2016   MCV 97.8 09/26/2016   PLT 193 09/26/2016  *  ASSESSMENT AND PLAN  1. Inferior ST elevation with anterior depression.   - pending emergent cath.   2. Tobacco smoking   Signed, Belva Crome III, PA-C 09/26/2016, 3:38 PM Pager [phone]

## 2016-09-26 NOTE — ED Provider Notes (Signed)
[facility] DEPT Provider Note   CSN: [id number] Arrival date & time: 09/26/16  1041     History   Chief Complaint Chief Complaint  Patient presents with  . Code STEMI    HPI Carol Ferguson is a 66 y.o. female.  HPI  Patient had chest pain onset a proximally 9 AM. As a burning and squeezing feeling. Radiation to the central back. Patient reports some waxing and waning in severity but still has ongoing pain. Patient is brought by EMS with EKG positive for STEMI. Patient had aspirin prior to arrival. Patient denies history of any significant GI bleed or CVA. Past Medical History:  Diagnosis Date  . Anxiety     Patient Active Problem List   Diagnosis Date Noted  . Lumbar degenerative disc disease 06/14/2016    Past Surgical History:  Procedure Laterality Date  . CHOLECYSTECTOMY      OB History    No data available       Home Medications    Prior to Admission medications   Medication Sig Start Date End Date Taking? Authorizing Provider  amitriptyline (ELAVIL) 25 MG tablet Take 1 tablet (25 mg total) by mouth at bedtime. 08/14/16   Stefanie Libel, MD  FLUoxetine (PROZAC) 10 MG capsule Take 10 mg by mouth daily.    [provider]    Family History History reviewed. No pertinent family history.  Social History Social History  Substance Use Topics  . Smoking status: Current Every Day Smoker    Packs/day: 1.00    Types: Cigarettes  . Smokeless tobacco: Never Used  . Alcohol use Yes     Comment: occasional     Allergies   Patient has no known allergies.   Review of Systems Review of Systems  Level V caveat cannot obtain due to patient condition. Physical Exam Updated Vital Signs SpO2 97% Comment: 15L 91% RA  Physical Exam  Constitutional: She is oriented to person, place, and time.  Patient is alert and oriented 3. She does not have respiratory distress at rest. She is anxious and slightly pale. Patient appears to be in pain.  HENT:    Head: Normocephalic and atraumatic.  Eyes: EOM are normal.  Cardiovascular: Normal rate, regular rhythm, normal heart sounds and intact distal pulses.   Pulmonary/Chest: Effort normal and breath sounds normal.  Abdominal: Soft. She exhibits no distension. There is no tenderness.  Musculoskeletal: She exhibits no edema.  Neurological: She is alert and oriented to person, place, and time. No cranial nerve deficit. She exhibits normal muscle tone. Coordination normal.  Skin: Skin is warm and dry. There is pallor.     ED Treatments / Results  Labs (all labs ordered are listed, but only abnormal results are displayed) Labs Reviewed - No data to display  EKG  EKG Interpretation EKG is obtained from EMS. EKG is not from department. Sinus rhythm 70. Inferior ST elevation with reciprocal significant anterior ST depression and inversion. Abnormal EKG       Radiology No results found.  Procedures Procedures (including critical care time) CRITICAL CARE Performed by: Charlesetta Shanks   Total critical care time: 10 minutes  Critical care time was exclusive of separately billable procedures and treating other patients.  Critical care was necessary to treat or prevent imminent or life-threatening deterioration.  Critical care was time spent personally by me on the following activities: development of treatment plan with patient and/or surrogate as well as nursing, discussions with consultants, evaluation of patient's response  to treatment, examination of patient, obtaining history from patient or surrogate, ordering and performing treatments and interventions, ordering and review of laboratory studies, ordering and review of radiographic studies, pulse oximetry and re-evaluation of patient's condition.  Medications Ordered in ED Medications  heparin injection 4,000 Units (4,000 Units Intravenous Given 09/26/16 1046)     Initial Impression / Assessment and Plan / ED Course  I have  reviewed the triage vital signs and the nursing notes.  Pertinent labs & imaging results that were available during my care of the patient were reviewed by me and considered in my medical decision making (see chart for details).     Consult: Patient is immediately seen emergency department by cardiac STEMI team  Final Clinical Impressions(s) / ED Diagnoses   Final diagnoses:  ST elevation myocardial infarction (STEMI), unspecified artery [facility])   Patient was evaluated in the emergency department upon EMS arrival. Cardiology at bedside. Heparin bolus initiated. Aspirin given prior to arrival. Once patient's stability assessed, she was taken directly to catheterization lab. New Prescriptions Current Discharge Medication List       Charlesetta Shanks, MD 09/26/16 1058

## 2016-09-26 NOTE — Progress Notes (Signed)
CRITICAL VALUE ALERT  Critical Value:  Trop  Date & Time Notied:  09/26/2016 1255   Provider Notified: Tamala Julian  Orders Received/Actions taken: none

## 2016-09-26 NOTE — Progress Notes (Signed)
At 1300 I took over care of this patient. Lab had just drawn lab work from left hand and pts niece alerted me the hand was swelling. Hematoma had formed at the site of lab stick. Applied pressure,warm pack and elevated hand. Hematoma had greatly decreased in size 15 minutes later. 30 minutes later, hematoma had gotten bigger, repeated steps above and held pressure for 15 minutes. Hematoma then decreased in size. Alerted Dr. Tamala Julian and was decided to stop aggrastat as the TR band had been oozing blood as well. At 1430 I attempted to take 3cc of air out of TR band and TR band immediately started bleeding. Reinflated with 3 cc of air and will reassess at 1500. Etta Quill

## 2016-09-27 ENCOUNTER — Inpatient Hospital Stay (HOSPITAL_COMMUNITY): Payer: Medicare Other

## 2016-09-27 DIAGNOSIS — I2121 ST elevation (STEMI) myocardial infarction involving left circumflex coronary artery: Secondary | ICD-10-CM

## 2016-09-27 DIAGNOSIS — I2510 Atherosclerotic heart disease of native coronary artery without angina pectoris: Secondary | ICD-10-CM

## 2016-09-27 DIAGNOSIS — I5032 Chronic diastolic (congestive) heart failure: Secondary | ICD-10-CM

## 2016-09-27 DIAGNOSIS — I5031 Acute diastolic (congestive) heart failure: Secondary | ICD-10-CM

## 2016-09-27 DIAGNOSIS — E785 Hyperlipidemia, unspecified: Secondary | ICD-10-CM

## 2016-09-27 DIAGNOSIS — Z720 Tobacco use: Secondary | ICD-10-CM

## 2016-09-27 LAB — LIPID PANEL
CHOLESTEROL: 144 mg/dL (ref 0–200)
HDL: 56 mg/dL (ref >40)
LDL Cholesterol: 68 mg/dL (ref 0–99)
TRIGLYCERIDES: 102 mg/dL (ref <150)
Total CHOL/HDL Ratio: 2.6 RATIO
VLDL: 20 mg/dL (ref 0–40)

## 2016-09-27 LAB — BASIC METABOLIC PANEL
Anion gap: 7 (ref 5–15)
BUN: 13 mg/dL (ref 6–20)
CALCIUM: 8.5 mg/dL — AB (ref 8.9–10.3)
CO2: 25 mmol/L (ref 22–32)
Chloride: 107 mmol/L (ref 101–111)
Creatinine, Ser: 0.8 mg/dL (ref 0.44–1.00)
GFR calc Af Amer: >60 mL/min (ref >60)
GLUCOSE: 112 mg/dL — AB (ref 65–99)
Potassium: 3.6 mmol/L (ref 3.5–5.1)
Sodium: 139 mmol/L (ref 135–145)

## 2016-09-27 LAB — CBC
HCT: 41.1 % (ref 36.0–46.0)
Hemoglobin: 13.8 g/dL (ref 12.0–15.0)
MCH: 32.9 pg (ref 26.0–34.0)
MCHC: 33.6 g/dL (ref 30.0–36.0)
MCV: 98.1 fL (ref 78.0–100.0)
PLATELETS: 181 10*3/uL (ref 150–400)
RBC: 4.19 MIL/uL (ref 3.87–5.11)
RDW: 12.9 % (ref 11.5–15.5)
WBC: 8.1 10*3/uL (ref 4.0–10.5)

## 2016-09-27 LAB — HEMOGLOBIN A1C
HEMOGLOBIN A1C: 5.6 % (ref 4.8–5.6)
MEAN PLASMA GLUCOSE: 114 mg/dL

## 2016-09-27 LAB — ECHOCARDIOGRAM COMPLETE
HEIGHTINCHES: 65 in
WEIGHTICAEL: 2634.94 [oz_av]

## 2016-09-27 MED ORDER — ATORVASTATIN CALCIUM 40 MG PO TABS: 40.0000 mg | ORAL_TABLET | Freq: Every day | ORAL | Status: DC

## 2016-09-27 NOTE — Care Management Note (Addendum)
Case Management Note Marvetta Gibbons RN, BSN Unit 2W-Case Manager-- [place] coverage [phone]  Patient Details  Name: Carol Ferguson MRN: [id number] Date of Birth: March 26, 1951  Subjective/Objective:   Pt admitted with STEM- s/p cath with DES                 Action/Plan: PTA pt lived at home- referral received for PCP needs- also noted pt started on Brilinta- insurance check submitted for copay cost- no info returned- per conversation with pt - she does not have any drug coverage and plans to get this in October in open enrollment- will provide pt with 30 day free card for Brilinta and pt will need to apply for pt assistance- form has been placed on shadow chart for MD to fill out- this will need to be returned to pt prior to discharge- also discussed PCP needs with pt- she plans to call Dr. Pennie Banter office to see if he is taking new pt's- if not have provided pt with [facility] # if any further assistance is needed to find a PCP. - CM will follow for any further d/c needs. May need MATCH for any other medications at discharge. Pt uses [facility] on [address]  Expected Discharge Date:  09/27/16               Expected Discharge Plan:  Home/Self Care  In-House Referral:     Discharge planning Services  CM Consult, Medication Assistance, [facility]  Post Acute Care Choice:  NA Choice offered to:  NA  DME Arranged:    DME Agency:     HH Arranged:    HH Agency:     Status of Service:  In process, will continue to follow  If discussed at Long Length of Stay Meetings, dates discussed:    Additional Comments:  Dawayne Patricia, RN 09/27/2016, 10:24 AM

## 2016-09-27 NOTE — Plan of Care (Signed)
Problem: Activity: Goal: Ability to tolerate increased activity will improve Outcome: Progressing Pt out of bed with bathroom privileges as tolerated.  Problem: Cardiac: Goal: Ability to achieve and maintain adequate cardiopulmonary perfusion will improve Outcome: Not Met (add Reason) Pt denies pain and has adequate urinary out put

## 2016-09-27 NOTE — Progress Notes (Signed)
Report called and patient transferred to [address]. VSS.  Lucius Conn, RN

## 2016-09-27 NOTE — Progress Notes (Signed)
CARDIAC REHAB PHASE I   PRE:  Rate/Rhythm: 70 SR    BP: sitting 112/58    SaO2:   MODE:  Ambulation: 500 ft   POST:  Rate/Rhythm: 90 SR irregular    BP: sitting 105/73     SaO2:   Tolerated well, sts she feels better walking. She had walked about 300 ft earlier. Good, in depth education completed. Pt very receptive, asking many pertinent questions. She is motivated to quit smoking but is nervous. She was very excited about the fake cigarette. Gave her resources. She understands importance of Brilinta/ASA. Will refer to [facility], very interested. She is in irregular SR. She sts she has heard this from doctors before. Encouraged more walking. [place], ACSM 09/27/2016 2:30 PM

## 2016-09-27 NOTE — Progress Notes (Signed)
Progress Note  Patient Name: Carol Ferguson Date of Encounter: 09/27/2016  Primary Cardiologist: [facility]   Subjective   No complications overnight. No chest discomfort.    Inpatient Medications    Scheduled Meds: . aspirin  81 mg Oral Daily  . atorvastatin  80 mg Oral q1800  . carvedilol  3.125 mg Oral BID WC  . heparin  5,000 Units Subcutaneous Q8H  . pneumococcal 23 valent vaccine  0.5 mL Intramuscular Tomorrow-1000  . sodium chloride flush  3 mL Intravenous Q12H  . ticagrelor  90 mg Oral BID   Continuous Infusions: . sodium chloride     PRN Meds: sodium chloride, acetaminophen, ALPRAZolam, nitroGLYCERIN, ondansetron (ZOFRAN) IV, oxyCODONE-acetaminophen, sodium chloride flush   Vital Signs    Vitals:   09/27/16 0400 09/27/16 0500 09/27/16 0607 09/27/16 0608  BP: 111/62  (!) 104/58   Pulse:      Resp: 17   14  Temp: 99.4 F (37.4 C)     TempSrc: Oral     SpO2:      Weight:  164 lb 10.9 oz (74.7 kg)    Height:        Intake/Output Summary (Last 24 hours) at 09/27/16 0743 Last data filed at 09/27/16 0600  Gross per 24 hour  Intake             1758 ml  Output             1300 ml  Net              458 ml   Filed Weights   09/26/16 1207 09/26/16 1900 09/27/16 0500  Weight: 160 lb 11.5 oz (72.9 kg) 160 lb 11.5 oz (72.9 kg) 164 lb 10.9 oz (74.7 kg)    Telemetry    Normal sinus rhythm with occasional PVC - Personally Reviewed  ECG    Normal sinus rhythm with inferior nonspecific T-wave abnormality. No Q waves. - Personally Reviewed  Physical Exam   GEN: No acute distress.   Neck: No JVD Cardiac: RRR, no murmurs, rubs, or gallops. Arterial access site is unremarkable.  Respiratory: Clear to auscultation bilaterally. GI: Soft, nontender, non-distended  MS: No edema; No deformity. Ecchymosis on the dorsum of right arm/hand from IV stick while on antiplatelet fusion.. Neuro:  Nonfocal  Psych: Normal affect   Labs    Chemistry Recent Labs Lab  09/26/16 1253 09/27/16 0407  NA  --  139  K  --  3.6  CL  --  107  CO2  --  25  GLUCOSE  --  112*  BUN  --  13  CREATININE 0.83 0.80  CALCIUM  --  8.5*  GFRNONAA >60 >60  GFRAA >60 >60  ANIONGAP  --  7     Hematology Recent Labs Lab 09/26/16 1253 09/27/16 0407  WBC 10.0 8.1  RBC 4.49 4.19  HGB 15.0 13.8  HCT 43.9 41.1  MCV 97.8 98.1  MCH 33.4 32.9  MCHC 34.2 33.6  RDW 12.8 12.9  PLT 193 181   Lipid Profile: Results for CHERLY, ERNO (MRN [id number]) as of 09/27/2016 07:42  Ref. Range 09/27/2016 04:07  Total CHOL/HDL Ratio Latest Units: RATIO 2.6  Cholesterol Latest Ref Range: 0 - 200 mg/dL 144  HDL Cholesterol Latest Ref Range: >40 mg/dL 56  LDL (calc) Latest Ref Range: 0 - 99 mg/dL 68  Triglycerides Latest Ref Range: <150 mg/dL 102  VLDL Latest Ref Range: 0 - 40 mg/dL 20  Cardiac Enzymes Recent Labs Lab 09/26/16 1253 09/26/16 1822  TROPONINI 2.35* 34.78*   No results for input(s): TROPIPOC in the last 168 hours.   BNPNo results for input(s): BNP, PROBNP in the last 168 hours.   DDimer No results for input(s): DDIMER in the last 168 hours.   Radiology    No results found.  Cardiac Studies   Cardiac cath/PCI report from 09/26/16:  Coronary Diagrams   Diagnostic Diagram       Post-Intervention Diagram         ECHOCARDIOGRAPHY 09/27/16: Being performed at the time of this note. Eyeball assessment of LVEF is that it is normal.  Patient Profile     66 y.o. female smoker with history of depression and positive family history of CAD who presented with an inferolateral ST elevation myocardial infarction stuttering over 12-18 hours prior to admission. Treated with emergent mechanical revascularization and stenting of the distal circumflex.  Assessment & Plan    1. Acute inferolateral ST elevation myocardial infarction treated with mechanical revascularization and stenting of the distal circumflex coronary artery with dramatic improvement in  symptoms, normalization of EKG, and benign course over the past 24 hours. Plan his ambulation, risk factor modification, education by cardiac rehabilitation, and discharge in a.m. 09/28/16.  2. Tobacco abuse, discussed cessation.  3. Hyperlipidemia with current values of LDL below target. Blood work was done after receiving atorvastatin high intensity dose. She likely does not have a significant lipid problem but will continue statin therapy for pleomorphic benefits.  4. Acute diastolic dysfunction/heart failure, awaiting echo EF today.  Plan is to ambulate today and consider discharge tomorrow.  Signed, Sinclair Grooms, MD  09/27/2016, 7:43 AM

## 2016-09-28 DIAGNOSIS — I2119 ST elevation (STEMI) myocardial infarction involving other coronary artery of inferior wall: Principal | ICD-10-CM

## 2016-09-28 MED ORDER — TICAGRELOR 90 MG PO TABS: 90.0000 mg | ORAL_TABLET | Freq: Two times a day (BID) | ORAL | 11 refills | Status: DC

## 2016-09-28 MED ORDER — NITROGLYCERIN 0.4 MG SL SUBL: 0.4000 mg | SUBLINGUAL_TABLET | SUBLINGUAL | 11 refills | Status: DC | PRN

## 2016-09-28 MED ORDER — TICAGRELOR 90 MG PO TABS: 90.0000 mg | ORAL_TABLET | Freq: Two times a day (BID) | ORAL | 0 refills | Status: DC

## 2016-09-28 MED ORDER — METOPROLOL SUCCINATE ER 25 MG PO TB24: 12.5000 mg | ORAL_TABLET | Freq: Every day | ORAL | 11 refills | Status: DC

## 2016-09-28 MED ORDER — ASPIRIN 81 MG PO CHEW: 81.0000 mg | CHEWABLE_TABLET | Freq: Every day | ORAL | 11 refills | Status: DC

## 2016-09-28 MED ORDER — ATORVASTATIN CALCIUM 40 MG PO TABS: 40.0000 mg | ORAL_TABLET | Freq: Every day | ORAL | 11 refills | Status: DC

## 2016-09-28 NOTE — Discharge Summary (Signed)
Discharge Summary    Patient ID: Carol Ferguson  MRN: [id number], DOB/AGE: 28-Jun-1950 66 y.o.  Admit Date: 09/26/2016 Discharge Date: 09/28/2016  Primary Care Provider: Patient, No Pcp Per Primary Cardiologist: Dr. Tamala Julian, MD (new this admission)  Discharge Diagnoses    Principal Problem:   Acute ST elevation myocardial infarction (STEMI) of inferior wall [facility]) Active Problems:   Acute congestive heart failure with left ventricular diastolic dysfunction ([place])   Tobacco abuse   Hyperlipidemia with target LDL less than 70   Allergies No Known Allergies   History of Present Illness     66 year old female who prior to this admission did not have any previously known cardiac history but with history of ongoing tobacco abuse for > 30 years at 1/2-1 pack daily and chronic low back pain who presented to [facility] on 09/26/16 with code STEMI with acute inferior ST elevation MI with stuttering pain since 9 PM the night prior to presentation.   As above, she did not have any previously known cardiac history prior to this admission and was not on any daily medical therapy. She does have family history of CAD with MI in her mother and brother.  Patient started to have indigestion-like pain on the evening of 6/6 around 9 PM that radiated to her back/shoulder blade. This pain resolved without intervention, then returned intermittently overnight. She woke up on the morning of 6/7 with similar chest discomfort with worsening radiation. Pain had been present for approximately 10-12 hours prior to calling EMS. Upon EMS arrival she was noted to have code STEMI with inferior ST elevation. She was given ASA 324 mg and SL NTG x 2 by EMS.    Hospital Course     Consultants: Care management   Upon her arrival to the ED she noted severe burning pain with radiation to her shoulder blade. EKG showed NSR with inferior ST elevation with anterior st/t depression. Vitals showed bradycardia with heart rate in the 50s bpm  and stable vital signs with systolic BP of 834 mmHg. Cath lab was not immediately available; she was given heparin bolus, ASA, and IV fluids/ She was taken emergently to the cardiac catheterization lab that showed an occluded mid LCx s/p PCI/DEs with a 2.5 x 15 mm Resolute Onyx drug-eluting stent with post intervention TIMI 3 flow without residual stenosis. Remaining cath details included a proximal to mid LAD lesion of 20% stenosis, and proximal to mid RCA 25% stenosis. LVEDP was mildly elevated. She was treated with IV Aggrastat x 4 hours post-intervention. DAPT with ASA and Brilinta was advised for 9-12 months along with low-dose beta blocker, high-intensity statin, and cardiac rehab. Post-intervention echo showed an EF of 55-60% with probable severe hypokinesis of the entire inferior myocardium. LV diastolic function parameters were normal. Aortic valve was poorly visualized, and increased thickness of the septum, consistent with lipomatous hypertrophy. Troponin peaked at 34.   Post-procedure she did well and has been without recurrent symptoms concerning for angina. Post-procedure EKG normalized. She has been tolerating all medications. BP has been on the soft side in the mid 90s to low 196 mmHg systolic, thus precluding titration of beta blocker. On the morning of 6/9 her Coreg was changed to Toprol XL 12.5 mg daily given soft BP and she was advised to take this at nighttime prior to going to bed. She has been continued on DAPT with ASA 81 mg daily and Brilinta 90 mg bid (case manager has been consulted for drug  card). The importance of not missing any doses of DAPT has been discussed in detail with the patient. She will also be discharged on Lipitor 40 mg daily. She will need follow up lipid and liver function testing in 6-8 weeks. She has ambulated with cardiac rehab without issues and will be continuing n with phase II cardiac rehab as an outpatient. The importance of smoking cessation has been discussed  with her in detail, though she remains nervous about cessation. Post procedure labs remained stable.   The patient's right radial cath site has been examined is healing well without issues at this time. The patient has been seen by Dr. Johnsie Cancel, MD and felt to be stable for discharge today. All follow up appointments have been made. Discharge medications are listed below. Prescriptions have been reviewed with the patient and sent in to their pharmacy.  _____________  Discharge Vitals Blood pressure 94/70, pulse 89, temperature 98 F (36.7 C), temperature source Oral, resp. rate 14, height 5\' 5"  (1.651 m), weight 161 lb 11.2 oz (73.3 kg), SpO2 95 %.  Filed Weights   09/26/16 1900 09/27/16 0500 09/28/16 0629  Weight: 160 lb 11.5 oz (72.9 kg) 164 lb 10.9 oz (74.7 kg) 161 lb 11.2 oz (73.3 kg)    Labs & Radiologic Studies    CBC  Recent Labs  09/26/16 1253 09/27/16 0407  WBC 10.0 8.1  HGB 15.0 13.8  HCT 43.9 41.1  MCV 97.8 98.1  PLT 193 250   Basic Metabolic Panel  Recent Labs  09/26/16 1253 09/27/16 0407  NA  --  139  K  --  3.6  CL  --  107  CO2  --  25  GLUCOSE  --  112*  BUN  --  13  CREATININE 0.83 0.80  CALCIUM  --  8.5*   Liver Function Tests No results for input(s): AST, ALT, ALKPHOS, BILITOT, PROT, ALBUMIN in the last 72 hours. No results for input(s): LIPASE, AMYLASE in the last 72 hours. Cardiac Enzymes  Recent Labs  09/26/16 1253 09/26/16 1822  TROPONINI 2.35* 34.78*   BNP Invalid input(s): POCBNP D-Dimer No results for input(s): DDIMER in the last 72 hours. Hemoglobin A1C  Recent Labs  09/26/16 1253  HGBA1C 5.6   Fasting Lipid Panel  Recent Labs  09/27/16 0407  CHOL 144  HDL 56  LDLCALC 68  TRIG 102  CHOLHDL 2.6   Thyroid Function Tests  Recent Labs  09/26/16 1253  TSH 0.999   _____________  No results found.  Diagnostic Studies/Procedures   LHC 09/26/2016: Coronary Findings   Dominance: Right  Left Anterior Descending    Prox LAD to Mid LAD lesion, 20% stenosed.  First Diagonal Branch  Vessel is small in size.  Second Diagonal Branch  Vessel is small in size.  Ramus Intermedius  Vessel is small.  Left Circumflex  Mid Cx lesion, 100% stenosed.  Angioplasty: Lesion crossed with guidewire using a WIRE ASAHI PROWATER 180CM. Pre-stent angioplasty was performed using a BALLOON EMERGE MR [id number]. A STENT RESOLUTE ONYX 2.5X15 drug eluting stent was successfully placed, and does not overlap previously placed stent. Stent strut is well apposed. Post-stent angioplasty was not performed. There is no pre-interventional antegrade distal flow (TIMI 0). The post-interventional distal flow is normal (TIMI 3). The intervention was successful . No complications occurred at this lesion.  There is no residual stenosis post intervention.  First Obtuse Marginal Branch  Vessel is small in size.  Second Obtuse Marginal Branch  Vessel  is small in size.  Right Coronary Artery  Prox RCA to Mid RCA lesion, 25% stenosed.  Left Heart   Left Ventricle LV end diastolic pressure is mildly elevated.    Coronary Diagrams   Diagnostic Diagram       Post-Intervention Diagram         TTE 09/27/2016: Study Conclusions  - Left ventricle: The cavity size was normal. Systolic function was   normal. The estimated ejection fraction was in the range of 55%   to 60%. Probable severe hypokinesis of the entireinferior   myocardium. Left ventricular diastolic function parameters were   normal. - Aortic valve: Poorly visualized. - Atrial septum: There was increased thickness of the septum,   consistent with lipomatous hypertrophy. _____________  Disposition   Pt is being discharged home today in good condition.  Follow-up Plans & Appointments    Follow-up Information    [facility] Follow up.   Why:  Message has been sent to our [employer] for patient to be seen in 7 days  post discharge. If she has not heard from the office by 10/02/16 she has been advised to call our office at [phone]. Contact information: [place] [phone]         Discharge Instructions    Amb Referral to Cardiac Rehabilitation    Complete by:  As directed    Diagnosis:   Coronary Stents STEMI PTCA     Call MD for:  difficulty breathing, headache or visual disturbances    Complete by:  As directed    Call MD for:  extreme fatigue    Complete by:  As directed    Call MD for:  hives    Complete by:  As directed    Call MD for:  persistant dizziness or light-headedness    Complete by:  As directed    Call MD for:  persistant nausea and vomiting    Complete by:  As directed    Call MD for:  redness, tenderness, or signs of infection (pain, swelling, redness, odor or green/yellow discharge around incision site)    Complete by:  As directed    Call MD for:  severe uncontrolled pain    Complete by:  As directed    Call MD for:  temperature >100.4    Complete by:  As directed    Diet - low sodium heart healthy    Complete by:  As directed    Increase activity slowly    Complete by:  As directed       Discharge Medications   Current Discharge Medication List    START taking these medications   Details  aspirin 81 MG chewable tablet Chew 1 tablet (81 mg total) by mouth daily. Qty: 30 tablet, Refills: 11    atorvastatin (LIPITOR) 40 MG tablet Take 1 tablet (40 mg total) by mouth daily at 6 PM. Qty: 30 tablet, Refills: 11    metoprolol succinate (TOPROL XL) 25 MG 24 hr tablet Take 0.5 tablets (12.5 mg total) by mouth at bedtime. Qty: 30 tablet, Refills: 11    nitroGLYCERIN (NITROSTAT) 0.4 MG SL tablet Place 1 tablet (0.4 mg total) under the tongue every 5 (five) minutes x 3 doses as needed for chest pain. Qty: 25 tablet, Refills: 11    !! ticagrelor (BRILINTA) 90 MG TABS tablet Take 1 tablet (90 mg total) by  mouth 2 (two) times daily.  Qty: 60 tablet, Refills: 0    !! ticagrelor (BRILINTA) 90 MG TABS tablet Take 1 tablet (90 mg total) by mouth 2 (two) times daily. Qty: 60 tablet, Refills: 11     !! - Potential duplicate medications found. Please discuss with provider.    STOP taking these medications     Pseudoephedrine-APAP-DM (DAYQUIL PO)          Aspirin prescribed at discharge?  Yes High Intensity Statin Prescribed? (Lipitor 40-80mg  or Crestor 20-40mg ): Yes Beta Blocker Prescribed? Yes For EF <40%, was ACEI/ARB Prescribed? No: EF > 40% ADP Receptor Inhibitor Prescribed? (i.e. Plavix etc.-Includes Medically Managed Patients): Yes For EF <40%, Aldosterone Inhibitor Prescribed? No: EF > 40% Was EF assessed during THIS hospitalization? Yes Was Cardiac Rehab II ordered? (Included Medically managed Patients): Yes   Outstanding Labs/Studies   none  Duration of Discharge Encounter   Greater than 30 minutes including physician time.  Signed, Rise Mu, PA-C [facility] Pager: [phone] 09/28/2016, 9:29 AM

## 2016-09-28 NOTE — Discharge Instructions (Signed)
1) Do not miss any doses of you aspirin 81 mg daily or Brilinta 90 mg twice daily (blood thinners). If for any reason you are running out of medication and cannot get your medications at the pharmacy please call our office day or night.   2) Please work on quitting smoking.  3) Our office will contact you this upcoming week to schedule follow up in 7 days.  4) You will be hearing from outpatient cardiac rehab after you get home during this upcoming week.     Acute Coronary Syndrome Acute coronary syndrome (ACS) is a serious problem in which there is suddenly not enough blood and oxygen supplied to the heart. ACS may mean that one or more of the blood vessels in your heart (coronary arteries) may be blocked. ACS can result in chest pain or a heart attack (myocardial infarction or MI). What are the causes? This condition is caused by atherosclerosis, which is the buildup of fat and cholesterol (plaque) on the inside of the arteries. Over time, the plaque may narrow or block the artery, and this will lessen blood flow to the heart. Plaque can also become weak and break off within a coronary artery to form a clot and cause a sudden blockage. What increases the risk? The risk factors of this condition include:  High cholesterol levels.  High blood pressure (hypertension).  Smoking.  Diabetes.  Age.  Family history of chest pain, heart disease, or stroke.  Lack of exercise.  What are the signs or symptoms? The most common signs of this condition include:  Chest pain, which can be: ? A crushing or squeezing in the chest. ? A tightness, pressure, fullness, or heaviness in the chest. ? Present for more than a few minutes, or it can stop and recur.  Pain in the arms, neck, jaw, or back.  Unexplained heartburn or indigestion.  Shortness of breath.  Nausea.  Sudden cold sweats.  Feeling light-headed or dizzy.  Sometimes, this condition has no symptoms. How is this  diagnosed? ACS may be diagnosed through the following tests:  Electrocardiogram (ECG).  Blood tests.  Coronary angiogram. This is a procedure to look at the coronary arteries to see if there is any blockage.  How is this treated? Treatment for ACS may include:  Healthy behavioral changes to reduce or control risk factors.  Medicine.  Coronary stenting.A stent helps to keep an artery open.  Coronary angioplasty. This procedure widens a narrowed or blocked artery.  Coronary artery bypass surgery. This will allow your blood to pass the blockage (bypass) to reach your heart.  Follow these instructions at home: Eating and drinking  Follow a heart-healthy diet. A dietitian can you help to educate you about healthy food options and changes.  Use healthy cooking methods such as roasting, grilling, broiling, baking, poaching, steaming, or stir-frying. Talk to a dietitian to learn more about healthy cooking methods. Medicines  Take medicines only as directed by your health care provider.  Do not take the following medicines unless your health care provider approves: ? Nonsteroidal anti-inflammatory drugs (NSAIDs), such as ibuprofen, naproxen, or celecoxib. ? Vitamin supplements that contain vitamin A, vitamin E, or both. ? Hormone replacement therapy that contains estrogen with or without progestin.  Stop illegal drug use. Activity  Follow an exercise program that is approved by your health care provider.  Plan rest periods when you are fatigued. Lifestyle  Do not use any tobacco products, including cigarettes, chewing tobacco, or electronic cigarettes.  If you need help quitting, ask your health care provider.  If you drink alcohol, and your health care provider approves, limit your alcohol intake to no more than 1 drink per day. One drink equals 12 ounces of beer, 5 ounces of wine, or 1 ounces of hard liquor.  Learn to manage stress.  Maintain a healthy weight. Lose weight  as approved by your health care provider. General instructions  Manage other health conditions, such as hypertension and diabetes, as directed by your health care provider.  Keep all follow-up visits as directed by your health care provider. This is important.  Your health care provider may ask you to monitor your blood pressure. A blood pressure reading consists of a higher number over a lower number, such as 110 over 72, written as 110/72. Ideally, your blood pressure should be: ? Below 140/90 if you have no other medical conditions. ? Below 130/80 if you have diabetes or kidney disease. Get help right away if:  You have pain in your chest, neck, arm, jaw, stomach, or back that lasts more than a few minutes, is recurring, or is not relieved by taking medicine under your tongue (sublingual nitroglycerin).  You have profuse sweating without cause.  You have unexplained: ? Heartburn or indigestion. ? Shortness of breath or difficulty breathing. ? Nausea or vomiting. ? Fatigue. ? Feelings of nervousness or anxiety. ? Weakness. ? Diarrhea.  You have sudden light-headedness or dizziness.  You faint. These symptoms may represent a serious problem that is an emergency. Do not wait to see if the symptoms will go away. Get medical help right away. Call your local emergency services (911 in the [place] Do not drive yourself to the clinic or hospital. This information is not intended to replace advice given to you by your health care provider. Make sure you discuss any questions you have with your health care provider. Document Released: 04/08/2005 Document Revised: 09/20/2015 Document Reviewed: 08/10/2013 Elsevier Interactive Patient Education  2017 [employer].

## 2016-09-28 NOTE — Plan of Care (Signed)
Problem: Health Behavior/Discharge Planning: Goal: Ability to manage health-related needs will improve Outcome: Progressing Answer patient questions regarding follow up appointments upon D/C. Reassured patient that upon D/C paperwork will outline what she will need to do.

## 2016-09-28 NOTE — Progress Notes (Signed)
Progress Note  Patient Name: Carol Ferguson Date of Encounter: 09/28/2016  Primary Cardiologist: [place]   Subjective   Left hand hurts sleepy BP low    Inpatient Medications    Scheduled Meds: . aspirin  81 mg Oral Daily  . atorvastatin  40 mg Oral q1800  . carvedilol  3.125 mg Oral BID WC  . heparin  5,000 Units Subcutaneous Q8H  . pneumococcal 23 valent vaccine  0.5 mL Intramuscular Tomorrow-1000  . ticagrelor  90 mg Oral BID   Continuous Infusions:  PRN Meds: acetaminophen, ALPRAZolam, nitroGLYCERIN, ondansetron (ZOFRAN) IV, oxyCODONE-acetaminophen   Vital Signs    Vitals:   09/28/16 0230 09/28/16 0315 09/28/16 0629 09/28/16 0800  BP: (!) 100/42 105/64  94/70  Pulse:      Resp:      Temp:      TempSrc:      SpO2:    95%  Weight:   161 lb 11.2 oz (73.3 kg)   Height:        Intake/Output Summary (Last 24 hours) at 09/28/16 0838 Last data filed at 09/28/16 0600  Gross per 24 hour  Intake              750 ml  Output              800 ml  Net              -50 ml   Filed Weights   09/26/16 1900 09/27/16 0500 09/28/16 0629  Weight: 160 lb 11.5 oz (72.9 kg) 164 lb 10.9 oz (74.7 kg) 161 lb 11.2 oz (73.3 kg)    Telemetry    Normal sinus rhythm with occasional PVC - Personally Reviewed  ECG    Normal sinus rhythm with inferior nonspecific T-wave abnormality. No Q waves. - Personally Reviewed  Physical Exam   BP 94/70 (BP Location: Left Arm)   Pulse 89   Temp 98 F (36.7 C) (Oral)   Resp 14   Ht 5\' 5"  (1.651 m)   Wt 161 lb 11.2 oz (73.3 kg)   SpO2 95%   BMI 26.91 kg/m  Affect appropriate Healthy:  appears stated age HEENT: normal Neck supple with no adenopathy JVP normal no bruits no thyromegaly Lungs clear with no wheezing and good diaphragmatic motion Heart:  S1/S2 no murmur, no rub, gallop or click PMI normal Abdomen: benighn, BS positve, no tenderness, no AAA no bruit.  No HSM or HJR Distal pulses intact with no bruits No  edema Neuro non-focal Skin warm and dry No muscular weakness Right radial cath sight A swelling over volar aspect left hand from blood draw  Labs    Chemistry  Recent Labs Lab 09/26/16 1253 09/27/16 0407  NA  --  139  K  --  3.6  CL  --  107  CO2  --  25  GLUCOSE  --  112*  BUN  --  13  CREATININE 0.83 0.80  CALCIUM  --  8.5*  GFRNONAA >60 >60  GFRAA >60 >60  ANIONGAP  --  7     Hematology  Recent Labs Lab 09/26/16 1253 09/27/16 0407  WBC 10.0 8.1  RBC 4.49 4.19  HGB 15.0 13.8  HCT 43.9 41.1  MCV 97.8 98.1  MCH 33.4 32.9  MCHC 34.2 33.6  RDW 12.8 12.9  PLT 193 181   Lipid Profile: Results for Carol Ferguson, Carol Ferguson (MRN [id number]) as of 09/27/2016 07:42  Ref. Range 09/27/2016 04:07  Total CHOL/HDL Ratio Latest Units: RATIO 2.6  Cholesterol Latest Ref Range: 0 - 200 mg/dL 144  HDL Cholesterol Latest Ref Range: >40 mg/dL 56  LDL (calc) Latest Ref Range: 0 - 99 mg/dL 68  Triglycerides Latest Ref Range: <150 mg/dL 102  VLDL Latest Ref Range: 0 - 40 mg/dL 20    Cardiac Enzymes  Recent Labs Lab 09/26/16 1253 09/26/16 1822  TROPONINI 2.35* 34.78*   No results for input(s): TROPIPOC in the last 168 hours.   BNPNo results for input(s): BNP, PROBNP in the last 168 hours.   DDimer No results for input(s): DDIMER in the last 168 hours.   Radiology    No results found.  Cardiac Studies   Cardiac cath/PCI report from 09/26/16:  Coronary Diagrams   Diagnostic Diagram       Post-Intervention Diagram         ECHOCARDIOGRAPHY 09/27/16: Being performed at the time of this note. Eyeball assessment of LVEF is that it is normal.  Patient Profile     66 y.o. female smoker with history of depression and positive family history of CAD who presented with an inferolateral ST elevation myocardial infarction stuttering over 12-18 hours prior to admission. Treated with emergent mechanical revascularization and stenting of the distal circumflex.  Assessment & Plan     1. Acute inferolateral ST elevation myocardial infarction DES to circumflex  DAT BP low but try  To take low dose coreg bid as resting HR in mid 80's . Change to Toprol 12.5 daily and take at night for d/c  2. Tobacco abuse, discussed cessation.  3. Hyperlipidemia with current values of LDL below target. Blood work was done after receiving atorvastatin high intensity dose. She likely does not have a significant lipid problem but will continue statin therapy for pleomorphic benefits.  4. Acute diastolic dysfunction/heart failure,  From ischemia echo reviewed EF 59-09% no valve complication   Dc home today outpatient f/u Malena Catholic, [place], MD  09/28/2016, 8:38 AM

## 2016-09-28 NOTE — Care Management Note (Signed)
Case Management Note Original Note Created by Marvetta Gibbons RN, BSN Unit 2W-Case Manager-- [place] coverage [phone]  Patient Details  Name: Carol Ferguson MRN: [id number] Date of Birth: 06/05/1950  Subjective/Objective:   Pt admitted with STEM- s/p cath with DES                 Action/Plan: PTA pt lived at home- referral received for PCP needs- also noted pt started on Brilinta- insurance check submitted for copay cost- no info returned- per conversation with pt - she does not have any drug coverage and plans to get this in October in open enrollment- will provide pt with 30 day free card for Brilinta and pt will need to apply for pt assistance- form has been placed on shadow chart for MD to fill out- this will need to be returned to pt prior to discharge- also discussed PCP needs with pt- she plans to call Dr. Pennie Banter office to see if he is taking new pt's- if not have provided pt with [facility] # if any further assistance is needed to find a PCP. - CM will follow for any further d/c needs. May need MATCH for any other medications at discharge. Pt uses [facility] [address]  Expected Discharge Date:  09/28/16               Expected Discharge Plan:  Home/Self Care  In-House Referral:     Discharge planning Services  CM Consult, Medication Assistance, [facility]  Post Acute Care Choice:  NA Choice offered to:  NA  DME Arranged:    DME Agency:     HH Arranged:    HH Agency:     Status of Service:  In process, will continue to follow  If discussed at Long Length of Stay Meetings, dates discussed:    Additional Comments: 09/28/2016 Pt will discharge home today.  CM confirmed with pt all the above information provided by previous CM.  NP with Cardiology completed Brilinta assistance form and CM provided form directly to pt.  Pt states she can pay for the other medications on discharge list without needing assistance. [name], RN 09/28/2016, 10:03 AM

## 2016-09-30 ENCOUNTER — Telehealth: Payer: Self-pay | Admitting: Physician Assistant

## 2016-09-30 NOTE — Telephone Encounter (Signed)
Patient contacted regarding discharge from [facility] on 09/28/2016 Patient understands to follow up with provider Melina Copa on 10/08/2016 at 0830 at [facility]. office Patient understands discharge instructions? yes Patient understands medications and regiment? Yes- Pt states she has all her medications prescribed from hospitalization Patient understands to bring all medications to this visit? yes  Pt states she is doing well.  Pt states she has not smoked since her hospitalization.  Pt asked about pain to her abdomen where she received her heparin shots during hospitalization.  Notified Pt a little discomfort is normal.  Advised Pt can take acetaminophen for pain, would not recommend her naproxen at this time with her current medication list.  Pt indicates understanding.  Pt asked when she would be able to drive.  Advised Pt should not drive until cleared by APP at next hospital visit.  Pt indicates understanding.  Gave Pt this nurse name and # if any further questions.

## 2016-09-30 NOTE — Telephone Encounter (Signed)
-----   Message from Rise Mu, PA-C sent at 09/28/2016  8:49 AM EDT ----- Can she please get TCM in 7 days with APP or Dr. Tamala Julian?  Thanks!

## 2016-09-30 NOTE — Telephone Encounter (Signed)
New message   Pt had shots of heparan in her lower stomach and she   said its bruised and it hurts and what can she take for pain

## 2016-10-07 ENCOUNTER — Telehealth (HOSPITAL_COMMUNITY): Payer: Self-pay

## 2016-10-07 ENCOUNTER — Encounter: Payer: Self-pay | Admitting: Physician Assistant

## 2016-10-07 NOTE — Telephone Encounter (Signed)
Patient insurances are active and benefits verified. Medicare A/B - no co-payment, deductible $183.00/$183.00 has been met, 20% co-insurance, no out of pocket, no pre-authorization and no limit. Passport/reference [phone]. [employer] medicare supplement - no co-payment, no deductible, no out of pocket and no limit on visit. Follow medicare guidelines and covers Medicare 20% co-insurance. Spoke w/ Donnie Aho B @ [employer] - reference [phone].

## 2016-10-07 NOTE — Progress Notes (Signed)
Cardiology Office Note    Date:  10/08/2016  ID:  Carol Ferguson, DOB 1951-03-26, MRN [id number] PCP:  Deland Pretty, MD  Cardiologist:  Dr. Tamala Julian   Chief Complaint: f/u MI  History of Present Illness:  Carol Ferguson is a 66 y.o. female with history of tobacco abuse, chronic low back pain and recently diagnosed CAD who presents for post-hospital follow-up.  She was recently admitted for severe burning chest pain with radiation to shoulder blade and found to have inferior STEMI. She underwent LHC which showed occluded mid LCx treated with PCI, DES; remaining cath details included a proximal to mid LAD lesion of 20% stenosis, and proximal to mid RCA 25% stenosis. LVEDP was mildly elevated. She was started on standard post MI therapy including ASA, Brilinta, beta blocker (Coreg changed to pToprol given soft BP), atorvastatin. 2D Echo 09/28/16 showed EF 55-60%, probable severe hypokinesis of the entire inferiormyocardium, normal diastolic parameters. Labs during admission showed peak troponin 34, LDL 68, normal CBC, glucose 112, K 3.6, Cr 0.80, A1C 5.6, TSH wnl, no recent LFTs.  She returns for follow-up feeling much better since discharge. She quit smoking on 09/26/16. She did have some substantial bruising to her left arm after an IV infiltrated in her wrist but this is improving and pain is improving. Cath site unremarkable. She has had some chronic left shoulder pain with movement. No exertional angina. Eager to proceed with cardiac rehab as she's never been an exerciser, but this scared her. She has noticed mild dyspnea in getting back to activity but was never regularly physically active before. She has a history of hip pain when walking but prior lumbar eval showed possible fusion with tailbone. No h/o nonhealing ulcers. She states her legs have always had a dusky appearance but is actually better since PCI. No palpitations, syncope, LEE, orthopnea. Tolerating meds without difficulty.     Past  Medical History:  Diagnosis Date  . Anxiety   . CAD (coronary artery disease)    a. Inf STEMI/LHC 09/2016 which showed occluded mid LCx treated with PCI, DES; remaining cath details included a proximal to mid LAD lesion of 20% stenosis, and proximal to mid RCA 25% stenosis. EF 55-60%.  . Ischemic cardiomyopathy    a. 2D Echo 09/28/16 showed EF 55-60%, probable severe hypokinesis of the entire inferiormyocardium, normal diastolic parameters.  . Tobacco abuse     Past Surgical History:  Procedure Laterality Date  . CHOLECYSTECTOMY    . CORONARY STENT INTERVENTION N/A 09/26/2016   Procedure: Coronary Stent Intervention;  Surgeon: Belva Crome, MD;  Location: [facility];  Service: Cardiovascular;  Laterality: N/A;  . LEFT HEART CATH AND CORONARY ANGIOGRAPHY N/A 09/26/2016   Procedure: Left Heart Cath and Coronary Angiography;  Surgeon: Belva Crome, MD;  Location: [facility];  Service: Cardiovascular;  Laterality: N/A;    Current Medications: Current Outpatient Prescriptions  Medication Sig Dispense Refill  . aspirin 81 MG chewable tablet Chew 1 tablet (81 mg total) by mouth daily. 30 tablet 11  . atorvastatin (LIPITOR) 40 MG tablet Take 1 tablet (40 mg total) by mouth daily at 6 PM. 30 tablet 11  . metoprolol succinate (TOPROL XL) 25 MG 24 hr tablet Take 0.5 tablets (12.5 mg total) by mouth at bedtime. 30 tablet 11  . nitroGLYCERIN (NITROSTAT) 0.4 MG SL tablet Place 1 tablet (0.4 mg total) under the tongue every 5 (five) minutes x 3 doses as needed for chest pain.  25 tablet 11  . ticagrelor (BRILINTA) 90 MG TABS tablet Take 1 tablet (90 mg total) by mouth 2 (two) times daily. 60 tablet 0   No current facility-administered medications for this visit.      Allergies:   Patient has no known allergies.   Social History   Social History  . Marital status: Divorced    Spouse name: N/A  . Number of children: N/A  . Years of education: N/A   Social History Main Topics  .  Smoking status: Current Every Day Smoker    Packs/day: 1.00    Types: Cigarettes  . Smokeless tobacco: Never Used  . Alcohol use Yes     Comment: occasional  . Drug use: No  . Sexual activity: Not Asked   Other Topics Concern  . None   Social History Narrative  . None     Family History:  Family History  Problem Relation Age of Onset  . Heart attack Mother   . Heart attack Brother     ROS:   Please see the history of present illness.  All other systems are reviewed and otherwise negative.    PHYSICAL EXAM:   VS:  BP 124/76   Pulse 63   Ht 5' (1.524 m)   Wt 159 lb 4 oz (72.2 kg)   SpO2 98%   BMI 31.10 kg/m   BMI: Body mass index is 31.1 kg/m. GEN: Well nourished, well developed WF, in no acute distress  HEENT: normocephalic, atraumatic Neck: no JVD, carotid bruits, or masses Cardiac: RRR; no murmurs, rubs, or gallops, no edema. Distal pulses are faint 1+ bilaterally equally with dusky LE appearance, no ulcers Respiratory:  clear to auscultation bilaterally, normal work of breathing GI: soft, nontender, nondistended, + BS MS: no deformity or atrophy  Skin: warm and dry, no rash. Right radial cath site without hematoma or ecchymosis; good pulse. Left wrist with bruising of hand and forearm which appears to be in progressive stage of healing (yellowing of bruise), good pulse Neuro:  Alert and Oriented x 3, Strength and sensation are intact, follows commands Psych: euthymic mood, full affect  Wt Readings from Last 3 Encounters:  10/08/16 159 lb 4 oz (72.2 kg)  09/28/16 161 lb 11.2 oz (73.3 kg)  07/25/16 164 lb (74.4 kg)      Studies/Labs Reviewed:   EKG:  EKG was ordered today and personally reviewed by me and demonstrates NSR 63bpm, RSR pattern V1, nonspecific TW changes with TWI III, avF, V4-V6. Suspect evolution of prior MI.  Recent Labs: 09/26/2016: TSH 0.999 09/27/2016: BUN 13; Creatinine, Ser 0.80; Hemoglobin 13.8; Platelets 181; Potassium 3.6; Sodium 139    Lipid Panel    Component Value Date/Time   CHOL 144 09/27/2016 0407   TRIG 102 09/27/2016 0407   HDL 56 09/27/2016 0407   CHOLHDL 2.6 09/27/2016 0407   VLDL 20 09/27/2016 0407   LDLCALC 68 09/27/2016 0407    Additional studies/ records that were reviewed today include: Summarized above    ASSESSMENT & PLAN:   CAD - overall doing well. No recurrent anginal pain. She has experienced anxiety post-MI and has been on the lookout vigilantly for recurrent symptoms. She has noticed mild dyspnea getting back to activity but was not very active before. May be due to deconditioning, longstanding tobacco or Brilinta use. This is improving every day so will continue current regimen. Anticipate good compliance with cardiac rehab. We discussed that she may return to driving in 5 days (  2 weeks post DC). She works at a desk job part time. I feel that if she is feeling well in another 5 days or so, she may return as long as no interim change in her status. I told her to play it by ear and let us know if she needs to be out longer or would like to participate in rehab before proceeding. She states her job has offered to be very accomodating in giving her breaks and allowing her to rest. Check baseline LFTs. It appears Dr. Tamala Julian chose the 40mg  dose given that the patient did not appear to have a significant lipid issue. If the patient is tolerating statin at time of follow-up appointment, would recheck liver/lipids at that time. Discussed gradual reintroduction into activity. She would like to walk her dogs again. She reassures me that they do not regularly pull. I asked her to allow time for her LUE bruising to resolve and take it slow, but encouraged her the end goal of resuming all prior activities. 1. Ischemic cardiomyopathy - discussed finding on echo, anticipate improvement with revascularization. BP would not tolerate ACEI at this time. 2. Tobacco abuse - congratulated her on huge feat of quitting!  Encouraged continued abstinence. 3. Hyperglycemia - recent A1C wnl. 4. Decreased lower extremity pulses - ?claudication with prior hip pain. Will arrange LE PVD screening.  Disposition: F/u with me or Dr. Tamala Julian in 6 weeks.  Medication Adjustments/Labs and Tests Ordered: Current medicines are reviewed at length with the patient today.  Concerns regarding medicines are outlined above. Medication changes, Labs and Tests ordered today are summarized above and listed in the Patient Instructions accessible in Encounters.   Signed, Charlie Pitter, PA-C  10/08/2016 8:38 AM    [facility] [place], [place],   [address] Phone: [phone]; Fax: [phone]

## 2016-10-08 ENCOUNTER — Encounter: Payer: Self-pay | Admitting: Physician Assistant

## 2016-10-08 ENCOUNTER — Ambulatory Visit (INDEPENDENT_AMBULATORY_CARE_PROVIDER_SITE_OTHER): Payer: Medicare Other | Admitting: Physician Assistant

## 2016-10-08 VITALS — BP 124/76 | HR 63 | Ht 60.0 in | Wt 159.2 lb

## 2016-10-08 DIAGNOSIS — Z9861 Coronary angioplasty status: Secondary | ICD-10-CM | POA: Diagnosis not present

## 2016-10-08 DIAGNOSIS — I2510 Atherosclerotic heart disease of native coronary artery without angina pectoris: Secondary | ICD-10-CM

## 2016-10-08 DIAGNOSIS — Z720 Tobacco use: Secondary | ICD-10-CM

## 2016-10-08 DIAGNOSIS — R739 Hyperglycemia, unspecified: Secondary | ICD-10-CM

## 2016-10-08 DIAGNOSIS — I255 Ischemic cardiomyopathy: Secondary | ICD-10-CM

## 2016-10-08 DIAGNOSIS — R0989 Other specified symptoms and signs involving the circulatory and respiratory systems: Secondary | ICD-10-CM

## 2016-10-08 LAB — HEPATIC FUNCTION PANEL
ALT: 18 IU/L (ref 0–32)
AST: 19 IU/L (ref 0–40)
Albumin: 4.3 g/dL (ref 3.6–4.8)
Alkaline Phosphatase: 97 IU/L (ref 39–117)
BILIRUBIN TOTAL: 0.4 mg/dL (ref 0.0–1.2)
Bilirubin, Direct: 0.14 mg/dL (ref 0.00–0.40)
Total Protein: 7 g/dL (ref 6.0–8.5)

## 2016-10-08 NOTE — Patient Instructions (Addendum)
Medication Instructions:  Your physician recommends that you continue on your current medications as directed. Please refer to the Current Medication list given to you today.  Labwork: Your physician recommends that you return for lab work today for LFT  Testing/Procedures: Your physician has requested that you have a lower extremity arterial exercise duplex. During this test, exercise and ultrasound are used to evaluate arterial blood flow in the legs. Allow one hour for this exam. There are no restrictions or special instructions.    Follow-Up: Your physician recommends that you schedule a follow-up appointment in: 6 weeks with Melina Copa, PA or Dr. Tamala Julian  Any Other Special Instructions Will Be Listed Below (If Applicable).     If you need a refill on your cardiac medications before your next appointment, please call your pharmacy.

## 2016-10-10 ENCOUNTER — Other Ambulatory Visit: Payer: Self-pay | Admitting: Physician Assistant

## 2016-10-10 DIAGNOSIS — R0989 Other specified symptoms and signs involving the circulatory and respiratory systems: Secondary | ICD-10-CM

## 2016-10-11 ENCOUNTER — Telehealth: Payer: Self-pay

## 2016-10-11 NOTE — Telephone Encounter (Signed)
Call received from Pt.  Pt expressed concern that she has felt generally weak, light headed, fatigued, no energy.  Unsure if it is because her BP is too low (states systolic is running 32-202 and diastolic has been around 70) or if she is just anxious.  All her medications are new s/p hospitalization with MI.   She is not on anything for anxiety that I can see.   Not sure if she is describing statin intolerance.  Or she may benefit from antidepressant. Could you please give her a call?  She asked for [place] to call her cell, [phone]  Thank you! Cristopher Estimable

## 2016-10-14 NOTE — Telephone Encounter (Signed)
Call placed to Pt.  Advised Pt that Dr. Tamala Julian wants her to stop her metoprolol.  Advised Pt that if she did not start to feel better in a week or so to call me back.  Pt indicates understanding.  Pt grateful for call.  States she returned to work today and has felt better being at work and out of the house.

## 2016-10-14 NOTE — Telephone Encounter (Signed)
Attempted to call pt.  No answer and message states no VM set up.  Will try again later.

## 2016-10-14 NOTE — Telephone Encounter (Signed)
Stop metoprolol succinate.

## 2016-10-16 ENCOUNTER — Telehealth: Payer: Self-pay | Admitting: Physician Assistant

## 2016-10-16 ENCOUNTER — Other Ambulatory Visit: Payer: Self-pay

## 2016-10-16 MED ORDER — TICAGRELOR 90 MG PO TABS: 90.0000 mg | ORAL_TABLET | Freq: Two times a day (BID) | ORAL | 11 refills | Status: DC

## 2016-10-16 NOTE — Telephone Encounter (Signed)
Received notification from [facility] that patient's Brilinta prescription is illegible.  New prescription printed and signed by Christell Faith, PA. Faxed to [facility], [phone]

## 2016-10-17 ENCOUNTER — Telehealth: Payer: Self-pay

## 2016-10-17 DIAGNOSIS — F418 Other specified anxiety disorders: Secondary | ICD-10-CM

## 2016-10-17 MED ORDER — BUPROPION HCL ER (SR) 150 MG PO TB12: 150.0000 mg | ORAL_TABLET | Freq: Every day | ORAL | 0 refills | Status: DC

## 2016-10-17 MED ORDER — BUPROPION HCL ER (SR) 150 MG PO TB12: 150.0000 mg | ORAL_TABLET | Freq: Two times a day (BID) | ORAL | 0 refills | Status: DC

## 2016-10-17 NOTE — Telephone Encounter (Signed)
Received order from attending for Wellbutrin.  Order placed as directed.  Call placed to Pt to notify her of new prescription.  Pt very grateful.

## 2016-10-17 NOTE — Telephone Encounter (Signed)
Start Wellbutrin/Zyban 150 mg daily for 3 days then twice a day for 2 months.

## 2016-10-17 NOTE — Telephone Encounter (Signed)
Received call from patient.  Pt with recent STEMI with stent placement.  Pt calling because she went "cold [place]" quitting smoking after her hospitalization.  Patient states she "just isn't comfortable in her own skin".  She is calling to see if there is something we can give her short term until she sees her new PCP Deland Pretty August 3rd.  Told her I would follow up and get back with her.

## 2016-10-18 ENCOUNTER — Ambulatory Visit (HOSPITAL_COMMUNITY)
Admission: RE | Admit: 2016-10-18 | Discharge: 2016-10-18 | Disposition: A | Discharge status: Home / Self Care | Payer: Medicare Other | Source: Ambulatory Visit | Attending: Cardiology | Admitting: Cardiology

## 2016-10-18 DIAGNOSIS — Z9861 Coronary angioplasty status: Secondary | ICD-10-CM | POA: Insufficient documentation

## 2016-10-18 DIAGNOSIS — R0989 Other specified symptoms and signs involving the circulatory and respiratory systems: Secondary | ICD-10-CM

## 2016-10-18 DIAGNOSIS — I2510 Atherosclerotic heart disease of native coronary artery without angina pectoris: Secondary | ICD-10-CM | POA: Insufficient documentation

## 2016-10-24 ENCOUNTER — Encounter (HOSPITAL_COMMUNITY): Payer: Self-pay

## 2016-10-24 ENCOUNTER — Encounter (HOSPITAL_COMMUNITY)
Admission: RE | Admit: 2016-10-24 | Discharge: 2016-10-24 | Disposition: A | Discharge status: Home / Self Care | Payer: Medicare Other | Source: Ambulatory Visit | Attending: Interventional Cardiology | Admitting: Interventional Cardiology

## 2016-10-24 DIAGNOSIS — I252 Old myocardial infarction: Secondary | ICD-10-CM | POA: Insufficient documentation

## 2016-10-24 DIAGNOSIS — Z79899 Other long term (current) drug therapy: Secondary | ICD-10-CM | POA: Insufficient documentation

## 2016-10-24 DIAGNOSIS — Z955 Presence of coronary angioplasty implant and graft: Secondary | ICD-10-CM | POA: Insufficient documentation

## 2016-10-24 DIAGNOSIS — F419 Anxiety disorder, unspecified: Secondary | ICD-10-CM | POA: Insufficient documentation

## 2016-10-24 DIAGNOSIS — I255 Ischemic cardiomyopathy: Secondary | ICD-10-CM | POA: Diagnosis not present

## 2016-10-24 DIAGNOSIS — F17210 Nicotine dependence, cigarettes, uncomplicated: Secondary | ICD-10-CM | POA: Diagnosis not present

## 2016-10-24 DIAGNOSIS — I2510 Atherosclerotic heart disease of native coronary artery without angina pectoris: Secondary | ICD-10-CM | POA: Insufficient documentation

## 2016-10-24 DIAGNOSIS — Z7982 Long term (current) use of aspirin: Secondary | ICD-10-CM | POA: Diagnosis not present

## 2016-10-24 DIAGNOSIS — I2121 ST elevation (STEMI) myocardial infarction involving left circumflex coronary artery: Secondary | ICD-10-CM

## 2016-10-24 DIAGNOSIS — Z7902 Long term (current) use of antithrombotics/antiplatelets: Secondary | ICD-10-CM | POA: Diagnosis not present

## 2016-10-24 NOTE — Progress Notes (Signed)
Cardiac Rehab Medication Review by a Pharmacist  Does the patient  feel that his/her medications are working for him/her?  yes  Has the patient been experiencing any side effects to the medications prescribed?  Yes - decreased appetite, anxiety  Does the patient measure his/her own blood pressure or blood glucose at home?  yes  Does the patient have any problems obtaining medications due to transportation or finances?   no  Understanding of regimen: good Understanding of indications: good Potential of compliance: excellent    Pharmacist comments: Carol Ferguson is a pleasant 66 year old female presenting in good spirits and ambulating without assistance. Reviewed allergies/medications with patient, appears to be tolerating regimen with exception of metoprolol. Patient experienced dizziness/subjective low blood pressure and patient states she was instructed by prescriber to temporarily discontinue metoprolol and to revisit beta blocker therapy at next visit in August. She also experiences lack of appetite, but does not think it is medication-related. Advised patient that low appetite may result from bupropion and to continue monitoring. Patient uses coupon/patient assistance to obtain Brilinta at no cost.   Carol Ferguson, PharmD PGY1 [facility] Resident Pager: [phone]

## 2016-10-24 NOTE — Progress Notes (Signed)
Cardiac Individual Treatment Plan  Patient Details  Name: Carol Ferguson MRN: [id number] Date of Birth: 1950-07-08 Referring Provider:     [place] from 10/24/2016 in [place]  Referring Provider  Daneen Schick MD      Initial Encounter Date:    CARDIAC REHAB PHASE II ORIENTATION from 10/24/2016 in [place]  Date  10/24/16  Referring Provider  Daneen Schick MD      Visit Diagnosis: ST elevation myocardial infarction involving left circumflex coronary artery [facility])  Status post coronary artery stent placement  Patient's Home Medications on Admission:  Current Outpatient Prescriptions:  .  acetaminophen (TYLENOL) 500 MG tablet, Take 500 mg by mouth every 6 (six) hours as needed., Disp: , Rfl:  .  aspirin 81 MG chewable tablet, Chew 1 tablet (81 mg total) by mouth daily., Disp: 30 tablet, Rfl: 11 .  atorvastatin (LIPITOR) 40 MG tablet, Take 1 tablet (40 mg total) by mouth daily at 6 PM., Disp: 30 tablet, Rfl: 11 .  buPROPion (WELLBUTRIN SR) 150 MG 12 hr tablet, Take 1 tablet (150 mg total) by mouth 2 (two) times daily., Disp: 60 tablet, Rfl: 0 .  nitroGLYCERIN (NITROSTAT) 0.4 MG SL tablet, Place 1 tablet (0.4 mg total) under the tongue every 5 (five) minutes x 3 doses as needed for chest pain., Disp: 25 tablet, Rfl: 11 .  ticagrelor (BRILINTA) 90 MG TABS tablet, Take 1 tablet (90 mg total) by mouth 2 (two) times daily., Disp: 60 tablet, Rfl: 11 .  metoprolol succinate (TOPROL XL) 25 MG 24 hr tablet, Take 0.5 tablets (12.5 mg total) by mouth at bedtime. (Patient not taking: Reported on 10/24/2016), Disp: 30 tablet, Rfl: 11  Past Medical History: Past Medical History:  Diagnosis Date  . Anxiety   . CAD (coronary artery disease)    a. Inf STEMI/LHC 09/2016 which showed occluded mid LCx treated with PCI, DES; remaining cath details included a proximal to mid LAD lesion of 20% stenosis, and proximal to  mid RCA 25% stenosis. EF 55-60%.  . Ischemic cardiomyopathy    a. 2D Echo 09/28/16 showed EF 55-60%, probable severe hypokinesis of the entire inferiormyocardium, normal diastolic parameters.  . Tobacco abuse     Tobacco Use: History  Smoking Status  . Current Every Day Smoker  . Packs/day: 1.00  . Types: Cigarettes  Smokeless Tobacco  . Never Used    Labs: Recent Review Flowsheet Data    Labs for ITP Cardiac and Pulmonary Rehab Latest Ref Rng & Units 09/26/2016 09/27/2016   Cholestrol 0 - 200 mg/dL - 144   LDLCALC 0 - 99 mg/dL - 68   HDL >40 mg/dL - 56   Trlycerides <150 mg/dL - 102   Hemoglobin A1c 4.8 - 5.6 % 5.6 -      Capillary Blood Glucose: No results found for: GLUCAP   Exercise Target Goals: Date: 10/24/16  Exercise Program Goal: Individual exercise prescription set with THRR, safety & activity barriers. Participant demonstrates ability to understand and report RPE using BORG scale, to self-measure pulse accurately, and to acknowledge the importance of the exercise prescription.  Exercise Prescription Goal: Starting with aerobic activity 30 plus minutes a day, 3 days per week for initial exercise prescription. Provide home exercise prescription and guidelines that participant acknowledges understanding prior to discharge.  Activity Barriers & Risk Stratification:     Activity Barriers & Cardiac Risk Stratification - 10/24/16 3299  Activity Barriers & Cardiac Risk Stratification   Activity Barriers Back Problems   Cardiac Risk Stratification High      6 Minute Walk:     6 Minute Walk    Row Name 10/24/16 1041         6 Minute Walk   Phase Initial     Distance 1654 feet     Walk Time 6 minutes     # of Rest Breaks 0     MPH 3.1     METS 3.6     RPE 11     VO2 Peak 12.7     Symptoms No     Resting HR 97 bpm     Resting BP 100/60     Max Ex. HR 112 bpm     Max Ex. BP 99/57     2 Minute Post BP 108/60        Oxygen Initial  Assessment:   Oxygen Re-Evaluation:   Oxygen Discharge (Final Oxygen Re-Evaluation):   Initial Exercise Prescription:     Initial Exercise Prescription - 10/24/16 1000      Date of Initial Exercise RX and Referring Provider   Date 10/24/16   Referring Provider Daneen Schick MD     Bike   Level 0.7   Minutes 10   METs 3     NuStep   Level 2   Minutes 10   METs 3     Track   Laps 12   Minutes 10   METs 3.1     Prescription Details   Frequency (times per week) 3   Duration Progress to 45 minutes of aerobic exercise without signs/symptoms of physical distress     Intensity   THRR 40-80% of Max Heartrate 61-122   Ratings of Perceived Exertion 11-13   Perceived Dyspnea 0-4     Progression   Progression Continue to progress workloads to maintain intensity without signs/symptoms of physical distress.     Resistance Training   Training Prescription Yes   Weight 2   Reps 10-15      Perform Capillary Blood Glucose checks as needed.  Exercise Prescription Changes:   Exercise Comments:   Exercise Goals and Review:     Exercise Goals    Row Name 10/24/16 1045             Exercise Goals   Increase Physical Activity Yes       Intervention Provide advice, education, support and counseling about physical activity/exercise needs.;Develop an individualized exercise prescription for aerobic and resistive training based on initial evaluation findings, risk stratification, comorbidities and participant's personal goals.       Expected Outcomes Achievement of increased cardiorespiratory fitness and enhanced flexibility, muscular endurance and strength shown through measurements of functional capacity and personal statement of participant.       Increase Strength and Stamina Yes       Intervention Provide advice, education, support and counseling about physical activity/exercise needs.;Develop an individualized exercise prescription for aerobic and resistive training  based on initial evaluation findings, risk stratification, comorbidities and participant's personal goals.       Expected Outcomes Achievement of increased cardiorespiratory fitness and enhanced flexibility, muscular endurance and strength shown through measurements of functional capacity and personal statement of participant.          Exercise Goals Re-Evaluation :    Discharge Exercise Prescription (Final Exercise Prescription Changes):   Nutrition:  Target Goals: Understanding of nutrition guidelines, daily intake of  sodium 1500mg , cholesterol 200mg , calories 30% from fat and 7% or less from saturated fats, daily to have 5 or more servings of fruits and vegetables.  Biometrics:     Pre Biometrics - 10/24/16 1057      Pre Biometrics   Waist Circumference 34.5 inches   Hip Circumference 43 inches   Waist to Hip Ratio 0.8 %   Triceps Skinfold 38 mm   % Body Fat 39.4 %   Grip Strength 35 kg   Flexibility 18 in   Single Leg Stand 25 seconds       Nutrition Therapy Plan and Nutrition Goals:     Nutrition Therapy & Goals - 10/24/16 1229      Nutrition Therapy   Diet Therapeutic Lifestyle Changes     Personal Nutrition Goals   Nutrition Goal Wt loss goal of 1-2 lb/week to a wt loss goal of 6-24 lb. Pt desired goal wt is 155 lb.      Intervention Plan   Intervention Prescribe, educate and counsel regarding individualized specific dietary modifications aiming towards targeted core components such as weight, hypertension, lipid management, diabetes, heart failure and other comorbidities.   Expected Outcomes Short Term Goal: Understand basic principles of dietary content, such as calories, fat, sodium, cholesterol and nutrients.;Long Term Goal: Adherence to prescribed nutrition plan.      Nutrition Discharge: Nutrition Scores:     Nutrition Assessments - 10/24/16 1229      MEDFICTS Scores   Pre Score 36      Nutrition Goals Re-Evaluation:   Nutrition Goals  Re-Evaluation:   Nutrition Goals Discharge (Final Nutrition Goals Re-Evaluation):   Psychosocial: Target Goals: Acknowledge presence or absence of significant depression and/or stress, maximize coping skills, provide positive support system. Participant is able to verbalize types and ability to use techniques and skills needed for reducing stress and depression.  Initial Review & Psychosocial Screening:     Initial Psych Review & Screening - 10/24/16 1320      Initial Review   Current issues with Current Stress Concerns;Current Anxiety/Panic     Family Dynamics   Good Support System? Yes   Concerns --  Recent loss of all my immediate family; divorced for 28 years, use to being independent     Barriers   Psychosocial barriers to participate in program The patient should benefit from training in stress management and relaxation.     Screening Interventions   Interventions To provide support and resources with identified psychosocial needs  Appt scheduled for Jeanella Craze next 7/11       Quality of Life Scores:     Quality of Life - 10/24/16 1100      Quality of Life Scores   Health/Function Pre 20 %   Socioeconomic Pre 22.38 %   Psych/Spiritual Pre 17.75 %   Family Pre 25.5 %   GLOBAL Pre 20.71 %      PHQ-9: Recent Review Flowsheet Data    There is no flowsheet data to display.     Interpretation of Total Score  Total Score Depression Severity:  1-4 = Minimal depression, 5-9 = Mild depression, 10-14 = Moderate depression, 15-19 = Moderately severe depression, 20-27 = Severe depression   Psychosocial Evaluation and Intervention:   Psychosocial Re-Evaluation:   Psychosocial Discharge (Final Psychosocial Re-Evaluation):   Vocational Rehabilitation: Provide vocational rehab assistance to qualifying candidates.   Vocational Rehab Evaluation & Intervention:     Vocational Rehab - 10/24/16 1312      Initial  Vocational Rehab Evaluation & Intervention    Assessment shows need for Vocational Rehabilitation Yes      Education: Education Goals: Education classes will be provided on a weekly basis, covering required topics. Participant will state understanding/return demonstration of topics presented.  Learning Barriers/Preferences:     Learning Barriers/Preferences - 10/24/16 9622      Learning Barriers/Preferences   Learning Barriers Sight   Learning Preferences Skilled Demonstration      Education Topics: Count Your Pulse:  -Group instruction provided by verbal instruction, demonstration, patient participation and written materials to support subject.  Instructors address importance of being able to find your pulse and how to count your pulse when at home without a heart monitor.  Patients get hands on experience counting their pulse with staff help and individually.   Heart Attack, Angina, and Risk Factor Modification:  -Group instruction provided by verbal instruction, video, and written materials to support subject.  Instructors address signs and symptoms of angina and heart attacks.    Also discuss risk factors for heart disease and how to make changes to improve heart health risk factors.   Functional Fitness:  -Group instruction provided by verbal instruction, demonstration, patient participation, and written materials to support subject.  Instructors address safety measures for doing things around the house.  Discuss how to get up and down off the floor, how to pick things up properly, how to safely get out of a chair without assistance, and balance training.   Meditation and Mindfulness:  -Group instruction provided by verbal instruction, patient participation, and written materials to support subject.  Instructor addresses importance of mindfulness and meditation practice to help reduce stress and improve awareness.  Instructor also leads participants through a meditation exercise.    Stretching for Flexibility and  Mobility:  -Group instruction provided by verbal instruction, patient participation, and written materials to support subject.  Instructors lead participants through series of stretches that are designed to increase flexibility thus improving mobility.  These stretches are additional exercise for major muscle groups that are typically performed during regular warm up and cool down.   Hands Only CPR:  -Group verbal, video, and participation provides a basic overview of AHA guidelines for community CPR. Role-play of emergencies allow participants the opportunity to practice calling for help and chest compression technique with discussion of AED use.   Hypertension: -Group verbal and written instruction that provides a basic overview of hypertension including the most recent diagnostic guidelines, risk factor reduction with self-care instructions and medication management.    Nutrition I class: Heart Healthy Eating:  -Group instruction provided by PowerPoint slides, verbal discussion, and written materials to support subject matter. The instructor gives an explanation and review of the Therapeutic Lifestyle Changes diet recommendations, which includes a discussion on lipid goals, dietary fat, sodium, fiber, plant stanol/sterol esters, sugar, and the components of a well-balanced, healthy diet.   CARDIAC REHAB PHASE II ORIENTATION from 10/24/2016 in [place]  Date  10/24/16  Educator  RD  Instruction Review Code  Not applicable [class handouts given]      Nutrition II class: Lifestyle Skills:  -Group instruction provided by PowerPoint slides, verbal discussion, and written materials to support subject matter. The instructor gives an explanation and review of label reading, grocery shopping for heart health, heart healthy recipe modifications, and ways to make healthier choices when eating out.   CARDIAC REHAB PHASE II ORIENTATION from 10/24/2016 in [place]  Date  10/24/16  Educator  RD  Instruction Review Code  Not applicable [class handouts given]      Diabetes Question & Answer:  -Group instruction provided by PowerPoint slides, verbal discussion, and written materials to support subject matter. The instructor gives an explanation and review of diabetes co-morbidities, pre- and post-prandial blood glucose goals, pre-exercise blood glucose goals, signs, symptoms, and treatment of hypoglycemia and hyperglycemia, and foot care basics.   Diabetes Blitz:  -Group instruction provided by PowerPoint slides, verbal discussion, and written materials to support subject matter. The instructor gives an explanation and review of the physiology behind type 1 and type 2 diabetes, diabetes medications and rational behind using different medications, pre- and post-prandial blood glucose recommendations and Hemoglobin A1c goals, diabetes diet, and exercise including blood glucose guidelines for exercising safely.    Portion Distortion:  -Group instruction provided by PowerPoint slides, verbal discussion, written materials, and food models to support subject matter. The instructor gives an explanation of serving size versus portion size, changes in portions sizes over the last 20 years, and what consists of a serving from each food group.   Stress Management:  -Group instruction provided by verbal instruction, video, and written materials to support subject matter.  Instructors review role of stress in heart disease and how to cope with stress positively.     Exercising on Your Own:  -Group instruction provided by verbal instruction, power point, and written materials to support subject.  Instructors discuss benefits of exercise, components of exercise, frequency and intensity of exercise, and end points for exercise.  Also discuss use of nitroglycerin and activating EMS.  Review options of places to exercise outside of rehab.   Review guidelines for sex with heart disease.   Cardiac Drugs I:  -Group instruction provided by verbal instruction and written materials to support subject.  Instructor reviews cardiac drug classes: antiplatelets, anticoagulants, beta blockers, and statins.  Instructor discusses reasons, side effects, and lifestyle considerations for each drug class.   Cardiac Drugs II:  -Group instruction provided by verbal instruction and written materials to support subject.  Instructor reviews cardiac drug classes: angiotensin converting enzyme inhibitors (ACE-I), angiotensin II receptor blockers (ARBs), nitrates, and calcium channel blockers.  Instructor discusses reasons, side effects, and lifestyle considerations for each drug class.   Anatomy and Physiology of the Circulatory System:  Group verbal and written instruction and models provide basic cardiac anatomy and physiology, with the coronary electrical and arterial systems. Review of: AMI, Angina, Valve disease, Heart Failure, Peripheral Artery Disease, Cardiac Arrhythmia, Pacemakers, and the ICD.   Other Education:  -Group or individual verbal, written, or video instructions that support the educational goals of the cardiac rehab program.   Knowledge Questionnaire Score:     Knowledge Questionnaire Score - 10/24/16 1045      Knowledge Questionnaire Score   Pre Score 21/24      Core Components/Risk Factors/Patient Goals at Admission:   Core Components/Risk Factors/Patient Goals Review:    Core Components/Risk Factors/Patient Goals at Discharge (Final Review):    ITP Comments:     ITP Comments    Row Name 10/24/16 [phone]           ITP Comments Dr. Fransico Him, Medical Director          Comments:  Patient attended orientation from 0800 to 1000 to review rules and guidelines for program. Completed 6 minute walk test, Intitial ITP, and exercise prescription.  VSS. Telemetry- SR,ST with rare PAC inverted T Wave (  see  previous note), anxiety. Brief Psychosocial Assessment Reveals anxiety and stress regarding unexpected heart event.  Appt for pt to be seen by Jeanella Craze in the spiritual care dept.  Pt is looking forward to participating in CR. In hopes this will help with her anxiety and confidence. Cherre Huger, BSN Cardiac and Training and development officer

## 2016-10-24 NOTE — Progress Notes (Signed)
Carol Ferguson 66 y.o. female       Nutrition Screen & Note  1. ST elevation myocardial infarction involving left circumflex coronary artery (HCC)   2. Status post coronary artery stent placement    Past Medical History:  Diagnosis Date  . Anxiety   . CAD (coronary artery disease)    a. Inf STEMI/LHC 09/2016 which showed occluded mid LCx treated with PCI, DES; remaining cath details included a proximal to mid LAD lesion of 20% stenosis, and proximal to mid RCA 25% stenosis. EF 55-60%.  . Ischemic cardiomyopathy    a. 2D Echo 09/28/16 showed EF 55-60%, probable severe hypokinesis of the entire inferiormyocardium, normal diastolic parameters.  . Tobacco abuse    Meds reviewed.  HT: Ht Readings from Last 1 Encounters:  10/08/16 5' (1.524 m)    WT: Wt Readings from Last 3 Encounters:  10/08/16 159 lb 4 oz (72.2 kg)  09/28/16 161 lb 11.2 oz (73.3 kg)  07/25/16 164 lb (74.4 kg)     BMI 31.2   Current tobacco use? No; recently quit tobacco use according to pt  Labs:  Lipid Panel     Component Value Date/Time   CHOL 144 09/27/2016 0407   TRIG 102 09/27/2016 0407   HDL 56 09/27/2016 0407   CHOLHDL 2.6 09/27/2016 0407   VLDL 20 09/27/2016 0407   LDLCALC 68 09/27/2016 0407    Lab Results  Component Value Date   HGBA1C 5.6 09/26/2016   CBG (last 3)  No results for input(s): GLUCAP in the last 72 hours.  Nutrition Note Spoke with pt. Nutrition plan and goals discussed with pt. Pt reports she has unintentionally lost 12 lb due to decreased appetite/anxiety/depression. Pt states she was recently started on Wellbutrin. Per discussion, pt is struggling with her current medical situation, feeling vulnerable and afraid. Pt educated re: counseling services available through Cardiac Rehab. Pt expressed interest in counseling services; Maurice Small, RN notified of pt's desire. Pt plans on focusing on tobacco cessation before tackling her wt loss goal. Pt expressed understanding of  the information reviewed. Pt aware of nutrition education classes offered and is unable to attend nutrition classes "because I have to work."  Nutrition Diagnosis ? Food-and nutrition-related knowledge deficit related to lack of exposure to information as related to diagnosis of: ? CVD ? Obesity related to excessive energy intake as evidenced by a BMI of 31.2  Nutrition Intervention ? Pt's individual nutrition plan reviewed with pt. ? Pt given handouts for: ? Nutrition I class ? Nutrition II class   Nutrition Goal(s):  ? Pt to identify food quantities necessary to achieve weight loss of 6-24 lb (2.7-10.9 kg) at graduation from cardiac rehab. Goal wt of 155 lb desired.   Plan:  Will provide client-centered nutrition education as part of interdisciplinary care.   Monitor and evaluate progress toward nutrition goal with team.  Derek Mound, M.Ed, RD, LDN, CDE 10/24/2016 9:33 AM

## 2016-10-24 NOTE — Progress Notes (Signed)
Pt in this morning for cardiac rehab orientation.  Pt placed on the monitor which showed SR with t Wave inversion. Compared ekg with Pt previous ekg post follow up from the hospital.  No T wave inversion noted.  Pt asymptomatic. Paged Trish who advised to please call Katie. Katie paged, and advised of pt ekg, asymptomatic, tolerated Walk test with no complaints.  Advised that [address] inversion is expected post stemi, continue to monitor. No further intervention needed. Cherre Huger, BSN Cardiac and Training and development officer

## 2016-10-25 ENCOUNTER — Other Ambulatory Visit: Payer: Self-pay | Admitting: Physician Assistant

## 2016-10-25 NOTE — Telephone Encounter (Signed)
Please review for  Refill. Thanks!

## 2016-10-30 ENCOUNTER — Ambulatory Visit (HOSPITAL_COMMUNITY)
Admission: RE | Admit: 2016-10-30 | Discharge: 2016-10-30 | Disposition: A | Discharge status: Home / Self Care | Payer: Medicare Other | Source: Ambulatory Visit | Attending: Internal Medicine | Admitting: Internal Medicine

## 2016-10-30 ENCOUNTER — Encounter (HOSPITAL_COMMUNITY)
Admission: RE | Admit: 2016-10-30 | Discharge: 2016-10-30 | Disposition: A | Discharge status: Home / Self Care | Payer: Medicare Other | Source: Ambulatory Visit | Attending: Interventional Cardiology | Admitting: Interventional Cardiology

## 2016-10-30 DIAGNOSIS — Z955 Presence of coronary angioplasty implant and graft: Secondary | ICD-10-CM | POA: Diagnosis not present

## 2016-10-30 DIAGNOSIS — I252 Old myocardial infarction: Secondary | ICD-10-CM | POA: Diagnosis not present

## 2016-10-30 DIAGNOSIS — I2510 Atherosclerotic heart disease of native coronary artery without angina pectoris: Secondary | ICD-10-CM | POA: Diagnosis not present

## 2016-10-30 DIAGNOSIS — Z7902 Long term (current) use of antithrombotics/antiplatelets: Secondary | ICD-10-CM | POA: Diagnosis not present

## 2016-10-30 DIAGNOSIS — F17210 Nicotine dependence, cigarettes, uncomplicated: Secondary | ICD-10-CM | POA: Diagnosis not present

## 2016-10-30 DIAGNOSIS — Z79899 Other long term (current) drug therapy: Secondary | ICD-10-CM | POA: Diagnosis not present

## 2016-10-30 DIAGNOSIS — I255 Ischemic cardiomyopathy: Secondary | ICD-10-CM | POA: Diagnosis not present

## 2016-10-30 DIAGNOSIS — F419 Anxiety disorder, unspecified: Secondary | ICD-10-CM | POA: Diagnosis not present

## 2016-10-30 DIAGNOSIS — I2121 ST elevation (STEMI) myocardial infarction involving left circumflex coronary artery: Secondary | ICD-10-CM

## 2016-10-30 DIAGNOSIS — Z7982 Long term (current) use of aspirin: Secondary | ICD-10-CM | POA: Diagnosis not present

## 2016-10-30 LAB — GLUCOSE, CAPILLARY: Glucose-Capillary: 113 mg/dL — ABNORMAL HIGH (ref 65–99)

## 2016-10-30 NOTE — Progress Notes (Signed)
Daily Session Note  Patient Details  Name: Carol Ferguson MRN: [id number] Date of Birth: 12/23/1950 Referring Provider:     CARDIAC REHAB PHASE II ORIENTATION from 10/24/2016 in [place]  Referring Provider  Daneen Schick MD      Encounter Date: 10/30/2016  Check In:     Session Check In - 10/30/16 1547      Check-In   Location [facility]   Staff Present Cleda Mccreedy, MS, Exercise Physiologist;Elainah Rhyne, RN, BSN;Joann Rion, RN, BSN   Supervising physician immediately available to respond to emergencies [facility] immediately available   Physician(s) Dr. Fredrich Birks    Medication changes reported     No   Fall or balance concerns reported    No   Tobacco Cessation No Change   Warm-up and Cool-down Performed as group-led instruction   Resistance Training Performed No   VAD Patient? No     Pain Assessment   Currently in Pain? No/denies      Capillary Blood Glucose: Results for orders placed or performed during the hospital encounter of 10/30/16 (from the past 24 hour(s))  Glucose, capillary     Status: Abnormal   Collection Time: 10/30/16  3:54 PM  Result Value Ref Range   Glucose-Capillary 113 (H) 65 - 99 mg/dL      History  Smoking Status  . Current Every Day Smoker  . Packs/day: 1.00  . Types: Cigarettes  Smokeless Tobacco  . Never Used    Goals Met:  Patient complained of feeling weak pain left shoulder blade  Goals Unmet:  Not Applicable  Comments: Carol Ferguson started cardiac rehab today. VSS, telemetry-Sinus Rhythm with an inverted t wave, asymptomatic.  Medication list reconciled. Pt denies barriers to medicaiton compliance.  PSYCHOSOCIAL ASSESSMENT:  PHQ-1.  Pt oriented to exercise equipment and routine.    Understanding verbalized. Jury quit smoking when she had a heart attack and has not smoked since. Carol Ferguson said she only ate a breakfast bar today for lunch. Patient was given peanut butter and  graham crackers and a banana prior to beginning exercise. Carol Ferguson reported feeling weak and said she had a pain in her left shoulder blade. Exercise stopped. 12 lead ECG obtained.  Blood pressure 108/70 heart rate 125.  Feet elevated. Valoria reported feeling better after resting. Carol Ferguson reviewed Madolin's 12 lead and said she can go home if her pain has resolved. Carol Ferguson also said  Carol Ferguson can return to exercise on Friday. Natesha reported feeling better upon exit from cardiac rehab and said she will be sure to eat lunch before coming to exercise on Friday afternoon. Will fax today's ECG tracings to Dr Thompson Caul office for review.Carol Pall, RN,BSN 10/30/2016 4:54 PM   Dr. Fransico Him is Medical Director for Cardiac Rehab at [facility].

## 2016-11-01 ENCOUNTER — Encounter (HOSPITAL_COMMUNITY)
Admission: RE | Admit: 2016-11-01 | Discharge: 2016-11-01 | Disposition: A | Discharge status: Home / Self Care | Payer: Medicare Other | Source: Ambulatory Visit | Attending: Interventional Cardiology | Admitting: Interventional Cardiology

## 2016-11-01 DIAGNOSIS — I2121 ST elevation (STEMI) myocardial infarction involving left circumflex coronary artery: Secondary | ICD-10-CM

## 2016-11-01 DIAGNOSIS — Z955 Presence of coronary angioplasty implant and graft: Secondary | ICD-10-CM

## 2016-11-01 DIAGNOSIS — I252 Old myocardial infarction: Secondary | ICD-10-CM | POA: Diagnosis not present

## 2016-11-01 DIAGNOSIS — F17210 Nicotine dependence, cigarettes, uncomplicated: Secondary | ICD-10-CM | POA: Diagnosis not present

## 2016-11-01 DIAGNOSIS — Z7982 Long term (current) use of aspirin: Secondary | ICD-10-CM | POA: Diagnosis not present

## 2016-11-01 DIAGNOSIS — Z79899 Other long term (current) drug therapy: Secondary | ICD-10-CM | POA: Diagnosis not present

## 2016-11-01 DIAGNOSIS — I2510 Atherosclerotic heart disease of native coronary artery without angina pectoris: Secondary | ICD-10-CM | POA: Diagnosis not present

## 2016-11-01 NOTE — Progress Notes (Signed)
Daily Session Note  Patient Details  Name: Carol Ferguson MRN: [id number] Date of Birth: Jan 26, 1951 Referring Provider:     CARDIAC REHAB PHASE II ORIENTATION from 10/24/2016 in [place]  Referring Provider  Daneen Schick MD      Encounter Date: 11/01/2016  Check In:     Session Check In - 11/01/16 1544      Check-In   Location [facility]   Staff Present Seward Carol, MS, ACSM CEP, Exercise Physiologist;Maria Whitaker, RN, BSN;Amber Fair, MS, ACSM RCEP, Exercise Physiologist   Supervising physician immediately available to respond to emergencies [facility] immediately available   Physician(s) Dr. [place] Pink    Medication changes reported     No   Fall or balance concerns reported    No   Tobacco Cessation No Change   Warm-up and Cool-down Performed as group-led instruction   Resistance Training Performed Yes   VAD Patient? No     Pain Assessment   Currently in Pain? No/denies   Multiple Pain Sites No      Capillary Blood Glucose: No results found for this or any previous visit (from the past 24 hour(s)).    History  Smoking Status  . Current Every Day Smoker  . Packs/day: 1.00  . Types: Cigarettes  Smokeless Tobacco  . Never Used    Goals Met:  Exercise tolerated well  Goals Unmet:  Not Applicable  Comments: [name] tolerated her first day of exercise without difficulty.Vital signs were stable.   Dr. Fransico Him is Medical Director for Cardiac Rehab at [facility].

## 2016-11-04 ENCOUNTER — Telehealth: Payer: Self-pay

## 2016-11-04 NOTE — Telephone Encounter (Signed)
Call received from Pt.  Per Pt, the Welbutrin has helped a little but now she is having panic attacks that are lasting for hours.  States Saturday she had a panic attack that lasted from noon to 4 pm.  Pt tearful during call.  I asked her if she had a PCP.  Per Pt, she was made an appt to see a PCP but they couldn't get her in until August.   Notified Pt I would contact her cardiologist here, but requested she also call her new PCP.  Pt asking for anything that can relieve her panic attacks.

## 2016-11-05 NOTE — Progress Notes (Signed)
Cardiac Individual Treatment Plan  Patient Details  Name: Carol Ferguson MRN: [id number] Date of Birth: 12-27-1950 Referring Provider:     [place] from 10/24/2016 in [place]  Referring Provider  Daneen Schick MD      Initial Encounter Date:    CARDIAC REHAB PHASE II ORIENTATION from 10/24/2016 in [place]  Date  10/24/16  Referring Provider  Daneen Schick MD      Visit Diagnosis: ST elevation myocardial infarction involving left circumflex coronary artery [facility])  Status post coronary artery stent placement  Patient's Home Medications on Admission:  Current Outpatient Prescriptions:  .  acetaminophen (TYLENOL) 500 MG tablet, Take 500 mg by mouth every 6 (six) hours as needed., Disp: , Rfl:  .  aspirin 81 MG chewable tablet, Chew 1 tablet (81 mg total) by mouth daily., Disp: 30 tablet, Rfl: 11 .  atorvastatin (LIPITOR) 40 MG tablet, Take 1 tablet (40 mg total) by mouth daily at 6 PM., Disp: 30 tablet, Rfl: 11 .  BRILINTA 90 MG TABS tablet, TAKE 1 TABLET BY MOUTH TWICE A DAY, Disp: 60 tablet, Rfl: 10 .  buPROPion (WELLBUTRIN SR) 150 MG 12 hr tablet, Take 1 tablet (150 mg total) by mouth 2 (two) times daily., Disp: 60 tablet, Rfl: 0 .  metoprolol succinate (TOPROL XL) 25 MG 24 hr tablet, Take 0.5 tablets (12.5 mg total) by mouth at bedtime., Disp: 30 tablet, Rfl: 11 .  nitroGLYCERIN (NITROSTAT) 0.4 MG SL tablet, Place 1 tablet (0.4 mg total) under the tongue every 5 (five) minutes x 3 doses as needed for chest pain., Disp: 25 tablet, Rfl: 11 .  ticagrelor (BRILINTA) 90 MG TABS tablet, Take 1 tablet (90 mg total) by mouth 2 (two) times daily., Disp: 60 tablet, Rfl: 11  Past Medical History: Past Medical History:  Diagnosis Date  . Anxiety   . CAD (coronary artery disease)    a. Inf STEMI/LHC 09/2016 which showed occluded mid LCx treated with PCI, DES; remaining cath details included a proximal to  mid LAD lesion of 20% stenosis, and proximal to mid RCA 25% stenosis. EF 55-60%.  . Ischemic cardiomyopathy    a. 2D Echo 09/28/16 showed EF 55-60%, probable severe hypokinesis of the entire inferiormyocardium, normal diastolic parameters.  . Tobacco abuse     Tobacco Use: History  Smoking Status  . Current Every Day Smoker  . Packs/day: 1.00  . Types: Cigarettes  Smokeless Tobacco  . Never Used    Labs: Recent Review Flowsheet Data    Labs for ITP Cardiac and Pulmonary Rehab Latest Ref Rng & Units 09/26/2016 09/27/2016   Cholestrol 0 - 200 mg/dL - 144   LDLCALC 0 - 99 mg/dL - 68   HDL >40 mg/dL - 56   Trlycerides <150 mg/dL - 102   Hemoglobin A1c 4.8 - 5.6 % 5.6 -      Capillary Blood Glucose: Lab Results  Component Value Date   GLUCAP 113 (H) 10/30/2016     Exercise Target Goals:    Exercise Program Goal: Individual exercise prescription set with THRR, safety & activity barriers. Participant demonstrates ability to understand and report RPE using BORG scale, to self-measure pulse accurately, and to acknowledge the importance of the exercise prescription.  Exercise Prescription Goal: Starting with aerobic activity 30 plus minutes a day, 3 days per week for initial exercise prescription. Provide home exercise prescription and guidelines that participant acknowledges understanding prior to  discharge.  Activity Barriers & Risk Stratification:     Activity Barriers & Cardiac Risk Stratification - 10/24/16 0834      Activity Barriers & Cardiac Risk Stratification   Activity Barriers Back Problems   Cardiac Risk Stratification High      6 Minute Walk:     6 Minute Walk    Row Name 10/24/16 1041         6 Minute Walk   Phase Initial     Distance 1654 feet     Walk Time 6 minutes     # of Rest Breaks 0     MPH 3.1     METS 3.6     RPE 11     VO2 Peak 12.7     Symptoms No     Resting HR 97 bpm     Resting BP 100/60     Max Ex. HR 112 bpm     Max Ex. BP  99/57     2 Minute Post BP 108/60        Oxygen Initial Assessment:   Oxygen Re-Evaluation:   Oxygen Discharge (Final Oxygen Re-Evaluation):   Initial Exercise Prescription:     Initial Exercise Prescription - 10/24/16 1000      Date of Initial Exercise RX and Referring Provider   Date 10/24/16   Referring Provider Daneen Schick MD     Bike   Level 0.7   Minutes 10   METs 3     NuStep   Level 2   Minutes 10   METs 3     Track   Laps 12   Minutes 10   METs 3.1     Prescription Details   Frequency (times per week) 3   Duration Progress to 45 minutes of aerobic exercise without signs/symptoms of physical distress     Intensity   THRR 40-80% of Max Heartrate 61-122   Ratings of Perceived Exertion 11-13   Perceived Dyspnea 0-4     Progression   Progression Continue to progress workloads to maintain intensity without signs/symptoms of physical distress.     Resistance Training   Training Prescription Yes   Weight 2   Reps 10-15      Perform Capillary Blood Glucose checks as needed.  Exercise Prescription Changes:     Exercise Prescription Changes    Row Name 11/04/16 1200             Response to Exercise   Blood Pressure (Admit) 98/72       Blood Pressure (Exercise) 142/82       Blood Pressure (Exit) 102/62       Heart Rate (Admit) 97 bpm       Heart Rate (Exercise) 127 bpm       Heart Rate (Exit) 92 bpm       Rating of Perceived Exertion (Exercise) 13       Duration Progress to 45 minutes of aerobic exercise without signs/symptoms of physical distress       Intensity THRR unchanged         Progression   Progression Continue to progress workloads to maintain intensity without signs/symptoms of physical distress.       Average METs 2.7         Resistance Training   Training Prescription Yes       Weight 2       Reps 10-15         Bike   Level  0.7       Minutes 10       METs 3         NuStep   Level 2       Minutes 10       METs  3         Track   Laps 13       Minutes 10       METs 3.26          Exercise Comments:     Exercise Comments    Row Name 11/04/16 1205           Exercise Comments pt is off to a good start with exercise. She c/o of some dizziness towards the end of her first day of exercise, but she said that she hadn't eaten much that day.  Discussed the importance of eating at least 30 min prior to exercise and pt verbalized understanding          Exercise Goals and Review:     Exercise Goals    Row Name 10/24/16 1045             Exercise Goals   Increase Physical Activity Yes       Intervention Provide advice, education, support and counseling about physical activity/exercise needs.;Develop an individualized exercise prescription for aerobic and resistive training based on initial evaluation findings, risk stratification, comorbidities and participant's personal goals.       Expected Outcomes Achievement of increased cardiorespiratory fitness and enhanced flexibility, muscular endurance and strength shown through measurements of functional capacity and personal statement of participant.       Increase Strength and Stamina Yes       Intervention Provide advice, education, support and counseling about physical activity/exercise needs.;Develop an individualized exercise prescription for aerobic and resistive training based on initial evaluation findings, risk stratification, comorbidities and participant's personal goals.       Expected Outcomes Achievement of increased cardiorespiratory fitness and enhanced flexibility, muscular endurance and strength shown through measurements of functional capacity and personal statement of participant.          Exercise Goals Re-Evaluation :    Discharge Exercise Prescription (Final Exercise Prescription Changes):     Exercise Prescription Changes - 11/04/16 1200      Response to Exercise   Blood Pressure (Admit) 98/72   Blood Pressure  (Exercise) 142/82   Blood Pressure (Exit) 102/62   Heart Rate (Admit) 97 bpm   Heart Rate (Exercise) 127 bpm   Heart Rate (Exit) 92 bpm   Rating of Perceived Exertion (Exercise) 13   Duration Progress to 45 minutes of aerobic exercise without signs/symptoms of physical distress   Intensity THRR unchanged     Progression   Progression Continue to progress workloads to maintain intensity without signs/symptoms of physical distress.   Average METs 2.7     Resistance Training   Training Prescription Yes   Weight 2   Reps 10-15     Bike   Level 0.7   Minutes 10   METs 3     NuStep   Level 2   Minutes 10   METs 3     Track   Laps 13   Minutes 10   METs 3.26      Nutrition:  Target Goals: Understanding of nutrition guidelines, daily intake of sodium <1586m, cholesterol <2054m calories 30% from fat and 7% or less from saturated fats, daily to have 5 or more servings of  fruits and vegetables.  Biometrics:     Pre Biometrics - 10/24/16 1057      Pre Biometrics   Waist Circumference 34.5 inches   Hip Circumference 43 inches   Waist to Hip Ratio 0.8 %   Triceps Skinfold 38 mm   % Body Fat 39.4 %   Grip Strength 35 kg   Flexibility 18 in   Single Leg Stand 25 seconds       Nutrition Therapy Plan and Nutrition Goals:     Nutrition Therapy & Goals - 10/24/16 1229      Nutrition Therapy   Diet Therapeutic Lifestyle Changes     Personal Nutrition Goals   Nutrition Goal Wt loss goal of 1-2 lb/week to a wt loss goal of 6-24 lb. Pt desired goal wt is 155 lb.      Intervention Plan   Intervention Prescribe, educate and counsel regarding individualized specific dietary modifications aiming towards targeted core components such as weight, hypertension, lipid management, diabetes, heart failure and other comorbidities.   Expected Outcomes Short Term Goal: Understand basic principles of dietary content, such as calories, fat, sodium, cholesterol and nutrients.;Long Term  Goal: Adherence to prescribed nutrition plan.      Nutrition Discharge: Nutrition Scores:     Nutrition Assessments - 10/24/16 1229      MEDFICTS Scores   Pre Score 36      Nutrition Goals Re-Evaluation:   Nutrition Goals Re-Evaluation:   Nutrition Goals Discharge (Final Nutrition Goals Re-Evaluation):   Psychosocial: Target Goals: Acknowledge presence or absence of significant depression and/or stress, maximize coping skills, provide positive support system. Participant is able to verbalize types and ability to use techniques and skills needed for reducing stress and depression.  Initial Review & Psychosocial Screening:     Initial Psych Review & Screening - 10/24/16 1320      Initial Review   Current issues with Current Stress Concerns;Current Anxiety/Panic     Family Dynamics   Good Support System? Yes   Concerns --  Recent loss of all my immediate family; divorced for 28 years, use to being independent     Barriers   Psychosocial barriers to participate in program The patient should benefit from training in stress management and relaxation.     Screening Interventions   Interventions To provide support and resources with identified psychosocial needs  Appt scheduled for Jeanella Craze next 7/11       Quality of Life Scores:     Quality of Life - 10/24/16 1100      Quality of Life Scores   Health/Function Pre 20 %   Socioeconomic Pre 22.38 %   Psych/Spiritual Pre 17.75 %   Family Pre 25.5 %   GLOBAL Pre 20.71 %      PHQ-9: Recent Review Flowsheet Data    Depression screen [facility] 2/9 10/30/2016 10/30/2016   Decreased Interest 0 0   Down, Depressed, Hopeless 1  0   PHQ - 2 Score 1 0     Interpretation of Total Score  Total Score Depression Severity:  1-4 = Minimal depression, 5-9 = Mild depression, 10-14 = Moderate depression, 15-19 = Moderately severe depression, 20-27 = Severe depression   Psychosocial Evaluation and  Intervention:   Psychosocial Re-Evaluation:     Psychosocial Re-Evaluation    [place] Name 11/05/16 1619             Psychosocial Re-Evaluation   Current issues with Current Anxiety/Panic       Comments -  Carol Ferguson has a lot of anxiety and met with the hospital chaplain last week and found this to be helpful.       Interventions Stress management education;Encouraged to attend Cardiac Rehabilitation for the exercise       Continue Psychosocial Services  Follow up required by staff          Psychosocial Discharge (Final Psychosocial Re-Evaluation):     Psychosocial Re-Evaluation - 11/05/16 1619      Psychosocial Re-Evaluation   Current issues with Current Anxiety/Panic   Comments --  Carol Ferguson has a lot of anxiety and met with the hospital chaplain last week and found this to be helpful.   Interventions Stress management education;Encouraged to attend Cardiac Rehabilitation for the exercise   Continue Psychosocial Services  Follow up required by staff      Vocational Rehabilitation: Provide vocational rehab assistance to qualifying candidates.   Vocational Rehab Evaluation & Intervention:     Vocational Rehab - 10/30/16 1700      Initial Vocational Rehab Evaluation & Intervention   Assessment shows need for Vocational Rehabilitation No     Vocational Rehab Re-Evaulation   Comments Carol Ferguson works part time and has returned to her job without difficulty. Carol Ferguson does not vocational reahb at this time.      Education: Education Goals: Education classes will be provided on a weekly basis, covering required topics. Participant will state understanding/return demonstration of topics presented.  Learning Barriers/Preferences:     Learning Barriers/Preferences - 10/24/16 1601      Learning Barriers/Preferences   Learning Barriers Sight   Learning Preferences Skilled Demonstration      Education Topics: Count Your Pulse:  -Group instruction provided by verbal  instruction, demonstration, patient participation and written materials to support subject.  Instructors address importance of being able to find your pulse and how to count your pulse when at home without a heart monitor.  Patients get hands on experience counting their pulse with staff help and individually.   Heart Attack, Angina, and Risk Factor Modification:  -Group instruction provided by verbal instruction, video, and written materials to support subject.  Instructors address signs and symptoms of angina and heart attacks.    Also discuss risk factors for heart disease and how to make changes to improve heart health risk factors.   Functional Fitness:  -Group instruction provided by verbal instruction, demonstration, patient participation, and written materials to support subject.  Instructors address safety measures for doing things around the house.  Discuss how to get up and down off the floor, how to pick things up properly, how to safely get out of a chair without assistance, and balance training.   Meditation and Mindfulness:  -Group instruction provided by verbal instruction, patient participation, and written materials to support subject.  Instructor addresses importance of mindfulness and meditation practice to help reduce stress and improve awareness.  Instructor also leads participants through a meditation exercise.    Stretching for Flexibility and Mobility:  -Group instruction provided by verbal instruction, patient participation, and written materials to support subject.  Instructors lead participants through series of stretches that are designed to increase flexibility thus improving mobility.  These stretches are additional exercise for major muscle groups that are typically performed during regular warm up and cool down.   Hands Only CPR:  -Group verbal, video, and participation provides a basic overview of AHA guidelines for community CPR. Role-play of emergencies allow  participants the opportunity to practice calling for help and chest compression  technique with discussion of AED use.   Hypertension: -Group verbal and written instruction that provides a basic overview of hypertension including the most recent diagnostic guidelines, risk factor reduction with self-care instructions and medication management.    Nutrition I class: Heart Healthy Eating:  -Group instruction provided by PowerPoint slides, verbal discussion, and written materials to support subject matter. The instructor gives an explanation and review of the Therapeutic Lifestyle Changes diet recommendations, which includes a discussion on lipid goals, dietary fat, sodium, fiber, plant stanol/sterol esters, sugar, and the components of a well-balanced, healthy diet.   CARDIAC REHAB PHASE II EXERCISE from 10/30/2016 in [place]  Date  10/24/16  Educator  RD  Instruction Review Code  Not applicable [class handouts given]      Nutrition II class: Lifestyle Skills:  -Group instruction provided by PowerPoint slides, verbal discussion, and written materials to support subject matter. The instructor gives an explanation and review of label reading, grocery shopping for heart health, heart healthy recipe modifications, and ways to make healthier choices when eating out.   CARDIAC REHAB PHASE II EXERCISE from 10/30/2016 in [place]  Date  10/24/16  Educator  RD  Instruction Review Code  Not applicable [class handouts given]      Diabetes Question & Answer:  -Group instruction provided by PowerPoint slides, verbal discussion, and written materials to support subject matter. The instructor gives an explanation and review of diabetes co-morbidities, pre- and post-prandial blood glucose goals, pre-exercise blood glucose goals, signs, symptoms, and treatment of hypoglycemia and hyperglycemia, and foot care basics.   Diabetes Blitz:   -Group instruction provided by PowerPoint slides, verbal discussion, and written materials to support subject matter. The instructor gives an explanation and review of the physiology behind type 1 and type 2 diabetes, diabetes medications and rational behind using different medications, pre- and post-prandial blood glucose recommendations and Hemoglobin A1c goals, diabetes diet, and exercise including blood glucose guidelines for exercising safely.    Portion Distortion:  -Group instruction provided by PowerPoint slides, verbal discussion, written materials, and food models to support subject matter. The instructor gives an explanation of serving size versus portion size, changes in portions sizes over the last 20 years, and what consists of a serving from each food group.   Stress Management:  -Group instruction provided by verbal instruction, video, and written materials to support subject matter.  Instructors review role of stress in heart disease and how to cope with stress positively.     Exercising on Your Own:  -Group instruction provided by verbal instruction, power point, and written materials to support subject.  Instructors discuss benefits of exercise, components of exercise, frequency and intensity of exercise, and end points for exercise.  Also discuss use of nitroglycerin and activating EMS.  Review options of places to exercise outside of rehab.  Review guidelines for sex with heart disease.   Cardiac Drugs I:  -Group instruction provided by verbal instruction and written materials to support subject.  Instructor reviews cardiac drug classes: antiplatelets, anticoagulants, beta blockers, and statins.  Instructor discusses reasons, side effects, and lifestyle considerations for each drug class.   Cardiac Drugs II:  -Group instruction provided by verbal instruction and written materials to support subject.  Instructor reviews cardiac drug classes: angiotensin converting enzyme  inhibitors (ACE-I), angiotensin II receptor blockers (ARBs), nitrates, and calcium channel blockers.  Instructor discusses reasons, side effects, and lifestyle considerations for each drug class.   CARDIAC  REHAB PHASE II EXERCISE from 10/30/2016 in [place]  Date  10/30/16  Instruction Review Code  2- meets goals/outcomes      Anatomy and Physiology of the Circulatory System:  Group verbal and written instruction and models provide basic cardiac anatomy and physiology, with the coronary electrical and arterial systems. Review of: AMI, Angina, Valve disease, Heart Failure, Peripheral Artery Disease, Cardiac Arrhythmia, Pacemakers, and the ICD.   Other Education:  -Group or individual verbal, written, or video instructions that support the educational goals of the cardiac rehab program.   Knowledge Questionnaire Score:     Knowledge Questionnaire Score - 10/24/16 1045      Knowledge Questionnaire Score   Pre Score 21/24      Core Components/Risk Factors/Patient Goals at Admission:   Core Components/Risk Factors/Patient Goals Review:    Core Components/Risk Factors/Patient Goals at Discharge (Final Review):    ITP Comments:     ITP Comments    Row Name 10/24/16 [phone]           ITP Comments Dr. Fransico Him, Medical Director          Comments: Carol Ferguson completed 2 exercise sessions last week and is off to a good start to exercise..Will continue to provide support and help Anette work through her anxiety. Carol Ferguson has an appointment to meet with the hospital chaplain on this Thursday.Tameyah says this has been helpful to her.Barnet Pall, RN,BSN 11/05/2016 4:28 PM

## 2016-11-05 NOTE — Telephone Encounter (Signed)
Spoke with pt and made her aware that Dr. Tamala Julian said to stop the Welbutrin.  Pt wanted to know what she needed to take instead.  She states she is still on edge and nervous.  She still has not contacted PCP again.  Advised her to contact PCP about getting a sooner appt.  Pt verbalized understanding and was in agreement with this plan.

## 2016-11-05 NOTE — Telephone Encounter (Signed)
Stop taking Wellbutrin.

## 2016-11-06 ENCOUNTER — Encounter (HOSPITAL_COMMUNITY)
Admission: RE | Admit: 2016-11-06 | Discharge: 2016-11-06 | Disposition: A | Discharge status: Home / Self Care | Payer: Medicare Other | Source: Ambulatory Visit

## 2016-11-06 ENCOUNTER — Ambulatory Visit (INDEPENDENT_AMBULATORY_CARE_PROVIDER_SITE_OTHER): Payer: Medicare Other | Admitting: Physician Assistant

## 2016-11-06 ENCOUNTER — Encounter: Payer: Self-pay | Admitting: Physician Assistant

## 2016-11-06 ENCOUNTER — Telehealth (HOSPITAL_COMMUNITY): Payer: Self-pay | Admitting: Internal Medicine

## 2016-11-06 VITALS — BP 113/75 | HR 67 | Temp 97.8°F | Resp 18 | Ht 64.45 in | Wt 152.4 lb

## 2016-11-06 DIAGNOSIS — I255 Ischemic cardiomyopathy: Secondary | ICD-10-CM

## 2016-11-06 DIAGNOSIS — F410 Panic disorder [episodic paroxysmal anxiety] without agoraphobia: Secondary | ICD-10-CM | POA: Diagnosis not present

## 2016-11-06 DIAGNOSIS — F329 Major depressive disorder, single episode, unspecified: Secondary | ICD-10-CM

## 2016-11-06 DIAGNOSIS — Z955 Presence of coronary angioplasty implant and graft: Secondary | ICD-10-CM

## 2016-11-06 DIAGNOSIS — I2121 ST elevation (STEMI) myocardial infarction involving left circumflex coronary artery: Secondary | ICD-10-CM

## 2016-11-06 DIAGNOSIS — F419 Anxiety disorder, unspecified: Secondary | ICD-10-CM | POA: Diagnosis not present

## 2016-11-06 DIAGNOSIS — F32A Depression, unspecified: Secondary | ICD-10-CM

## 2016-11-06 MED ORDER — CLONAZEPAM 0.5 MG PO TABS: 0.2500 mg | ORAL_TABLET | Freq: Two times a day (BID) | ORAL | 0 refills | Status: DC | PRN

## 2016-11-06 MED ORDER — FLUOXETINE HCL 10 MG PO CAPS: 10.0000 mg | ORAL_CAPSULE | Freq: Every day | ORAL | 0 refills | Status: DC

## 2016-11-06 NOTE — Progress Notes (Signed)
TAREA SKILLMAN  MRN: [id number] DOB: [birth year]  Subjective:  Carol Ferguson is a 66 y.o. female seen in office today for a chief complaint of depression and anxiety. Pt had an MI in 09/2016 and notes she has been very anxious since this, in part due to health concerns and in part due to stopping smoking after 35 years. She smoked 1ppd x 35 years prior to 09/2016. She recently started experiencing bouts of panic attack where she was would shaky, dizzy and bouts of dysphoric mood and episodes of crying so she contacted her cardiologist, Dr. Tamala Julian, a couple of weeks ago and initiated wellbutrin and told her to follow up with a  PCP. Pt does not have a PCP so she made an appointment to see one but the earliest she could get in was 11/22/2016 with Dr. Delmar Landau and she could not wait that long so she came in today.Ever since starting the wellbutrin, she has been having worsening anxiety,racing heart,insomnia, and decreased appetite so she contacted the cardiologist and he informed her to stop taking it. Her last dose was wellbutrin yesterday. Pt has dealt with both depression and anxiety in the past. Was started on prozac near age 66, stopped taking it about one year ago because things were going well. She is semi retired, only works 3 days a week. Denies suicidal or homicdal thoughts. She is seeing a Social worker, named Engineer, agricultural. He is associated with [facility]. She saw him for the first time last week and is seeing him again tomorrow. She notes it has really helped calm her. No hx of alcohol abuse or illicit drug use.   Review of Systems  Constitutional: Positive for appetite change (decreased appetite) and unexpected weight change (weight loss of 13 lbs since 09/2016). Negative for chills, diaphoresis and fever.  Eyes: Negative for visual disturbance.  Respiratory: Negative for cough and shortness of breath.   Cardiovascular: Negative for chest pain.  Gastrointestinal: Negative for abdominal pain,  diarrhea, nausea and vomiting.  Endocrine: Negative for polydipsia, polyphagia and polyuria.  Skin: Negative for color change and rash.    Patient Active Problem List   Diagnosis Date Noted  . Acute congestive heart failure with left ventricular diastolic dysfunction ([place]) 09/27/2016  . Tobacco abuse 09/27/2016  . Hyperlipidemia with target LDL less than 70 09/27/2016  . Acute ST elevation myocardial infarction (STEMI) of inferior wall ([place]) 09/26/2016  . Lumbar degenerative disc disease 06/14/2016    Current Outpatient Prescriptions on File Prior to Visit  Medication Sig Dispense Refill  . acetaminophen (TYLENOL) 500 MG tablet Take 500 mg by mouth every 6 (six) hours as needed.    Marland Kitchen aspirin 81 MG chewable tablet Chew 1 tablet (81 mg total) by mouth daily. 30 tablet 11  . atorvastatin (LIPITOR) 40 MG tablet Take 1 tablet (40 mg total) by mouth daily at 6 PM. 30 tablet 11  . BRILINTA 90 MG TABS tablet TAKE 1 TABLET BY MOUTH TWICE A DAY 60 tablet 10  . metoprolol succinate (TOPROL XL) 25 MG 24 hr tablet Take 0.5 tablets (12.5 mg total) by mouth at bedtime. 30 tablet 11  . nitroGLYCERIN (NITROSTAT) 0.4 MG SL tablet Place 1 tablet (0.4 mg total) under the tongue every 5 (five) minutes x 3 doses as needed for chest pain. 25 tablet 11  . ticagrelor (BRILINTA) 90 MG TABS tablet Take 1 tablet (90 mg total) by mouth 2 (two) times daily. 60 tablet 11   No current facility-administered medications  on file prior to visit.     No Known Allergies    Social History   Social History  . Marital status: Divorced    Spouse name: N/A  . Number of children: N/A  . Years of education: N/A   Occupational History  . Not on file.   Social History Main Topics  . Smoking status: Former Smoker    Packs/day: 1.00    Years: 35.00    Types: Cigarettes    Quit date: 09/26/2016  . Smokeless tobacco: Never Used  . Alcohol use Yes     Comment: occasional  . Drug use: No  . Sexual activity: Not on file     Other Topics Concern  . Not on file   Social History Narrative  . No narrative on file    Objective:  BP 113/75 (BP Location: Right Arm, Patient Position: Sitting, Cuff Size: Normal)   Pulse 67   Temp 97.8 F (36.6 C) (Oral)   Resp 18   Ht 5' 4.45" (1.637 m)   Wt 152 lb 6.4 oz (69.1 kg)   SpO2 96%   BMI 25.80 kg/m   Physical Exam  Constitutional: She is oriented to person, place, and time and well-developed, well-nourished, and in no distress.  HENT:  Head: Normocephalic and atraumatic.  Eyes: Conjunctivae are normal.  Neck: Normal range of motion.  Cardiovascular: Normal rate, regular rhythm and normal heart sounds.   Pulmonary/Chest: Breath sounds normal.  Neurological: She is alert and oriented to person, place, and time. She has normal reflexes. Gait normal.  Reflex Scores:      Tricep reflexes are 2+ on the right side and 2+ on the left side.      Bicep reflexes are 2+ on the right side and 2+ on the left side.      Brachioradialis reflexes are 2+ on the right side and 2+ on the left side.      Patellar reflexes are 2+ on the right side and 2+ on the left side.      Achilles reflexes are 2+ on the right side and 2+ on the left side. Skin: Skin is warm and dry.  Psychiatric: Her mood appears anxious.  Vitals reviewed.    Depression screen [facility] 2/9 11/06/2016 10/30/2016 10/30/2016  Decreased Interest 0 0 0  Down, Depressed, Hopeless 2 1 0  PHQ - 2 Score 2 1 0  Altered sleeping 3 - -  Tired, decreased energy 3 - -  Change in appetite 3 - -  Feeling bad or failure about yourself  2 - -  Trouble concentrating 2 - -  Moving slowly or fidgety/restless 2 - -  Suicidal thoughts 0 - -  PHQ-9 Score 17 - -  Difficult doing work/chores Very difficult - -   GAD 7 : Generalized Anxiety Score 11/06/2016  Nervous, Anxious, on Edge 3  Control/stop worrying 1  Worry too much - different things 0  Trouble relaxing 3  Restless 3  Easily annoyed or irritable 1  Afraid -  awful might happen 3  Total GAD 7 Score 14    Assessment and Plan :   1. Anxiety and depression Anxiety and depression uncontrolled. Will restart prozac at low dose of 10mg  at this time. Educated on risks and benefits of taking prozac recently after MI. Pt agrees to start medication. Educated on side effects. Encouraged that if she develops any worsening signs of anxiety/depression, suicidal thoughts, or heart palpitations to contact our office or  seek care immediately. Continue attending therapy with Mikki Santee. Plan to follow up in 4 weeks for reevaluation or with new PCP.  - FLUoxetine (PROZAC) 10 MG capsule; Take 1 capsule (10 mg total) by mouth daily.  Dispense: 90 capsule; Refill: 0  2. Panic attacks Pt educated on proper use of klonopin for moments of true panic attacks. Instructed that this is not meant to be a long term medication and will not be continued in the future for long term management but solely as prn management. Pt understands and agrees to treatment plan. Plan to follow up in 4 weeks for reevaluation or with new PCP. - clonazePAM (KLONOPIN) 0.5 MG tablet; Take 0.5 tablets (0.25 mg total) by mouth 2 (two) times daily as needed for anxiety.  Dispense: 30 tablet; Refill: 0  A total of 45 minutes was spent in the room with the patient, greater than 50% of which was in counseling/coordination of care regarding anxiety, depression, and panic attacks.  Tenna Delaine, PA-C  [facility] at [facility] 11/07/2016 1:18 PM

## 2016-11-06 NOTE — Patient Instructions (Addendum)
For anxiety, we are going to start low dose prozac for long term management and also klonopin for short term management of panic attacks. SSRIs, like prozac, can initially cause your anxiety and depression symptoms to worsen, please contact our office if this happens. Other side effects can include GI upset, nausea, and vomiting. These symptoms typically resolve after 2 weeks. Klonopin is to be used only in times of true panic attacks. It is not meant to be used daily or long term. Please follow up with your PCP as planned in August as he may need to adjust these medication doses. If you have any other questions, please let us know. Thank you for letting me participate in your health and well being.  Panic Attacks Panic attacks are sudden, short feelings of great fear or discomfort. You may have them for no reason when you are relaxed, when you are uneasy (anxious), or when you are sleeping. Follow these instructions at home:  Take all your medicines as told.  Check with your doctor before starting new medicines.  Keep all doctor visits. Contact a doctor if:  You are not able to take your medicines as told.  Your symptoms do not get better.  Your symptoms get worse. Get help right away if:  Your attacks seem different than your normal attacks.  You have thoughts about hurting yourself or others.  You take panic attack medicine and you have a side effect. This information is not intended to replace advice given to you by your health care provider. Make sure you discuss any questions you have with your health care provider. Document Released: 05/11/2010 Document Revised: 09/14/2015 Document Reviewed: 11/20/2012 Elsevier Interactive Patient Education  2017 [place].  Clonazepam tablets What is this medicine? CLONAZEPAM (kloe NA ze pam) is a benzodiazepine. It is used to treat certain types of seizures. It is also used to treat panic disorder. This medicine may be used for other  purposes; ask your health care provider or pharmacist if you have questions. COMMON BRAND NAME(S): Ceberclon, Klonopin What should I tell my health care provider before I take this medicine? They need to know if you have any of these conditions: -an alcohol or drug abuse problem -bipolar disorder, depression, psychosis or other mental health condition -glaucoma -kidney or liver disease -lung or breathing disease -myasthenia gravis -Parkinson's disease -porphyria -seizures or a history of seizures -suicidal thoughts -an unusual or allergic reaction to clonazepam, other benzodiazepines, foods, dyes, or preservatives -pregnant or trying to get pregnant -breast-feeding How should I use this medicine? Take this medicine by mouth with a glass of water. Follow the directions on the prescription label. If it upsets your stomach, take it with food or milk. Take your medicine at regular intervals. Do not take it more often than directed. Do not stop taking or change the dose except on the advice of your doctor or health care professional. A special MedGuide will be given to you by the pharmacist with each prescription and refill. Be sure to read this information carefully each time. Talk to your pediatrician regarding the use of this medicine in children. Special care may be needed. Overdosage: If you think you have taken too much of this medicine contact a poison control center or emergency room at once. NOTE: This medicine is only for you. Do not share this medicine with others. What if I miss a dose? If you miss a dose, take it as soon as you can. If it is almost time  for your next dose, take only that dose. Do not take double or extra doses. What may interact with this medicine? Do not take this medication with any of the following medicines: -narcotic medicines for cough -sodium oxybate This medicine may also interact with the following medications: -alcohol -antihistamines for allergy,  cough and cold -antiviral medicines for HIV or AIDS -certain medicines for anxiety or sleep -certain medicines for depression, like amitriptyline, fluoxetine, sertraline -certain medicines for fungal infections like ketoconazole and itraconazole -certain medicines for seizures like carbamazepine, phenobarbital, phenytoin, primidone -general anesthetics like halothane, isoflurane, methoxyflurane, propofol -local anesthetics like lidocaine, pramoxine, tetracaine -medicines that relax muscles for surgery -narcotic medicines for pain -phenothiazines like chlorpromazine, mesoridazine, prochlorperazine, thioridazine This list may not describe all possible interactions. Give your health care provider a list of all the medicines, herbs, non-prescription drugs, or dietary supplements you use. Also tell them if you smoke, drink alcohol, or use illegal drugs. Some items may interact with your medicine. What should I watch for while using this medicine? Tell your doctor or health care professional if your symptoms do not start to get better or if they get worse. Do not stop taking except on your doctor's advice. You may develop a severe reaction. Your doctor will tell you how much medicine to take. You may get drowsy or dizzy. Do not drive, use machinery, or do anything that needs mental alertness until you know how this medicine affects you. To reduce the risk of dizzy and fainting spells, do not stand or sit up quickly, especially if you are an older patient. Alcohol may increase dizziness and drowsiness. Avoid alcoholic drinks. If you are taking another medicine that also causes drowsiness, you may have more side effects. Give your health care provider a list of all medicines you use. Your doctor will tell you how much medicine to take. Do not take more medicine than directed. Call emergency for help if you have problems breathing or unusual sleepiness. The use of this medicine may increase the chance of  suicidal thoughts or actions. Pay special attention to how you are responding while on this medicine. Any worsening of mood, or thoughts of suicide or dying should be reported to your health care professional right away. What side effects may I notice from receiving this medicine? Side effects that you should report to your doctor or health care professional as soon as possible: -allergic reactions like skin rash, itching or hives, swelling of the face, lips, or tongue -breathing problems -confusion -loss of balance or coordination -signs and symptoms of low blood pressure like dizziness; feeling faint or lightheaded, falls; unusually weak or tired -suicidal thoughts or mood changes Side effects that usually do not require medical attention (report to your doctor or health care professional if they continue or are bothersome): -dizziness -headache -tiredness -upset stomach This list may not describe all possible side effects. Call your doctor for medical advice about side effects. You may report side effects to FDA at 1-800-FDA-1088. Where should I keep my medicine? Keep out of the reach of children. This medicine can be abused. Keep your medicine in a safe place to protect it from theft. Do not share this medicine with anyone. Selling or giving away this medicine is dangerous and against the law. This medicine may cause accidental overdose and death if taken by other adults, children, or pets. Mix any unused medicine with a substance like cat litter or coffee grounds. Then throw the medicine away in a sealed container  like a sealed bag or a coffee can with a lid. Do not use the medicine after the expiration date. Store at room temperature between 15 and 30 degrees C (59 and 86 degrees F). Protect from light. Keep container tightly closed. NOTE: This sheet is a summary. It may not cover all possible information. If you have questions about this medicine, talk to your doctor, pharmacist, or health  care provider.  2018 Elsevier/Gold Standard (2015-09-15 18:46:32) Fluoxetine capsules or tablets (Depression/Mood Disorders) What is this medicine? FLUOXETINE (floo OX e teen) belongs to a class of drugs known as selective serotonin reuptake inhibitors (SSRIs). It helps to treat mood problems such as depression, obsessive compulsive disorder, and panic attacks. It can also treat certain eating disorders. This medicine may be used for other purposes; ask your health care provider or pharmacist if you have questions. COMMON BRAND NAME(S): Prozac What should I tell my health care provider before I take this medicine? They need to know if you have any of these conditions: -bipolar disorder or a family history of bipolar disorder -bleeding disorders -glaucoma -heart disease -liver disease -low levels of sodium in the blood -seizures -suicidal thoughts, plans, or attempt; a previous suicide attempt by you or a family member -take MAOIs like Carbex, Eldepryl, Marplan, Nardil, and Parnate -take medicines that treat or prevent blood clots -thyroid disease -an unusual or allergic reaction to fluoxetine, other medicines, foods, dyes, or preservatives -pregnant or trying to get pregnant -breast-feeding How should I use this medicine? Take this medicine by mouth with a glass of water. Follow the directions on the prescription label. You can take this medicine with or without food. Take your medicine at regular intervals. Do not take it more often than directed. Do not stop taking this medicine suddenly except upon the advice of your doctor. Stopping this medicine too quickly may cause serious side effects or your condition may worsen. A special MedGuide will be given to you by the pharmacist with each prescription and refill. Be sure to read this information carefully each time. Talk to your pediatrician regarding the use of this medicine in children. While this drug may be prescribed for children as  young as 7 years for selected conditions, precautions do apply. Overdosage: If you think you have taken too much of this medicine contact a poison control center or emergency room at once. NOTE: This medicine is only for you. Do not share this medicine with others. What if I miss a dose? If you miss a dose, skip the missed dose and go back to your regular dosing schedule. Do not take double or extra doses. What may interact with this medicine? Do not take this medicine with any of the following medications: -other medicines containing fluoxetine, like Sarafem or Symbyax -cisapride -linezolid -MAOIs like Carbex, Eldepryl, Marplan, Nardil, and Parnate -methylene blue (injected into a vein) -pimozide -thioridazine This medicine may also interact with the following medications: -alcohol -amphetamines -aspirin and aspirin-like medicines -carbamazepine -certain medicines for depression, anxiety, or psychotic disturbances -certain medicines for migraine headaches like almotriptan, eletriptan, frovatriptan, naratriptan, rizatriptan, sumatriptan, zolmitriptan -digoxin -diuretics -fentanyl -flecainide -furazolidone -isoniazid -lithium -medicines for sleep -medicines that treat or prevent blood clots like warfarin, enoxaparin, and dalteparin -NSAIDs, medicines for pain and inflammation, like ibuprofen or naproxen -phenytoin -procarbazine -propafenone -rasagiline -ritonavir -supplements like St. John's wort, kava kava, valerian -tramadol -tryptophan -vinblastine This list may not describe all possible interactions. Give your health care provider a list of all the medicines, herbs, non-prescription  drugs, or dietary supplements you use. Also tell them if you smoke, drink alcohol, or use illegal drugs. Some items may interact with your medicine. What should I watch for while using this medicine? Tell your doctor if your symptoms do not get better or if they get worse. Visit your doctor or  health care professional for regular checks on your progress. Because it may take several weeks to see the full effects of this medicine, it is important to continue your treatment as prescribed by your doctor. Patients and their families should watch out for new or worsening thoughts of suicide or depression. Also watch out for sudden changes in feelings such as feeling anxious, agitated, panicky, irritable, hostile, aggressive, impulsive, severely restless, overly excited and hyperactive, or not being able to sleep. If this happens, especially at the beginning of treatment or after a change in dose, call your health care professional. Dennis Bast may get drowsy or dizzy. Do not drive, use machinery, or do anything that needs mental alertness until you know how this medicine affects you. Do not stand or sit up quickly, especially if you are an older patient. This reduces the risk of dizzy or fainting spells. Alcohol may interfere with the effect of this medicine. Avoid alcoholic drinks. Your mouth may get dry. Chewing sugarless gum or sucking hard candy, and drinking plenty of water may help. Contact your doctor if the problem does not go away or is severe. This medicine may affect blood sugar levels. If you have diabetes, check with your doctor or health care professional before you change your diet or the dose of your diabetic medicine. What side effects may I notice from receiving this medicine? Side effects that you should report to your doctor or health care professional as soon as possible: -allergic reactions like skin rash, itching or hives, swelling of the face, lips, or tongue -anxious -black, tarry stools -breathing problems -changes in vision -confusion -elevated mood, decreased need for sleep, racing thoughts, impulsive behavior -eye pain -fast, irregular heartbeat -feeling faint or lightheaded, falls -feeling agitated, angry, or irritable -hallucination, loss of contact with reality -loss  of balance or coordination -loss of memory -painful or prolonged erections -restlessness, pacing, inability to keep still -seizures -stiff muscles -suicidal thoughts or other mood changes -trouble sleeping -unusual bleeding or bruising -unusually weak or tired -vomiting Side effects that usually do not require medical attention (report to your doctor or health care professional if they continue or are bothersome): -change in appetite or weight -change in sex drive or performance -diarrhea -dry mouth -headache -increased sweating -nausea -tremors This list may not describe all possible side effects. Call your doctor for medical advice about side effects. You may report side effects to FDA at 1-800-FDA-1088. Where should I keep my medicine? Keep out of the reach of children. Store at room temperature between 15 and 30 degrees C (59 and 86 degrees F). Throw away any unused medicine after the expiration date. NOTE: This sheet is a summary. It may not cover all possible information. If you have questions about this medicine, talk to your doctor, pharmacist, or health care provider.  2018 Elsevier/Gold Standard (2015-09-09 15:55:27)    IF you received an x-ray today, you will receive an invoice from [facility]. Please contact [facility] at [phone] with questions or concerns regarding your invoice.   IF you received labwork today, you will receive an invoice from [place]. Please contact [facility] at [phone] with questions or concerns regarding your invoice.  Our billing staff will not be able to assist you with questions regarding bills from these companies.  You will be contacted with the lab results as soon as they are available. The fastest way to get your results is to activate your My Chart account. Instructions are located on the last page of this paperwork. If you have not heard from [place] regarding the results in 2 weeks, please contact this office.

## 2016-11-07 ENCOUNTER — Encounter: Payer: Self-pay | Admitting: Physician Assistant

## 2016-11-08 ENCOUNTER — Encounter (HOSPITAL_COMMUNITY)
Admission: RE | Admit: 2016-11-08 | Discharge: 2016-11-08 | Disposition: A | Discharge status: Home / Self Care | Payer: Medicare Other | Source: Ambulatory Visit | Attending: Interventional Cardiology | Admitting: Interventional Cardiology

## 2016-11-08 DIAGNOSIS — I2510 Atherosclerotic heart disease of native coronary artery without angina pectoris: Secondary | ICD-10-CM | POA: Diagnosis not present

## 2016-11-08 DIAGNOSIS — Z955 Presence of coronary angioplasty implant and graft: Secondary | ICD-10-CM | POA: Diagnosis not present

## 2016-11-08 DIAGNOSIS — Z7982 Long term (current) use of aspirin: Secondary | ICD-10-CM | POA: Diagnosis not present

## 2016-11-08 DIAGNOSIS — F17210 Nicotine dependence, cigarettes, uncomplicated: Secondary | ICD-10-CM | POA: Diagnosis not present

## 2016-11-08 DIAGNOSIS — Z79899 Other long term (current) drug therapy: Secondary | ICD-10-CM | POA: Diagnosis not present

## 2016-11-08 DIAGNOSIS — I252 Old myocardial infarction: Secondary | ICD-10-CM | POA: Diagnosis not present

## 2016-11-08 DIAGNOSIS — I2121 ST elevation (STEMI) myocardial infarction involving left circumflex coronary artery: Secondary | ICD-10-CM

## 2016-11-08 NOTE — Progress Notes (Signed)
Pt arrived at cardiac rehab reporting starting klonopin and prozac as prescribed by PCP at [facility].  Med list reconciled.

## 2016-11-13 ENCOUNTER — Encounter (HOSPITAL_COMMUNITY)
Admission: RE | Admit: 2016-11-13 | Discharge: 2016-11-13 | Disposition: A | Discharge status: Home / Self Care | Payer: Medicare Other | Source: Ambulatory Visit | Attending: Interventional Cardiology | Admitting: Interventional Cardiology

## 2016-11-13 DIAGNOSIS — Z79899 Other long term (current) drug therapy: Secondary | ICD-10-CM | POA: Diagnosis not present

## 2016-11-13 DIAGNOSIS — I2510 Atherosclerotic heart disease of native coronary artery without angina pectoris: Secondary | ICD-10-CM | POA: Diagnosis not present

## 2016-11-13 DIAGNOSIS — Z955 Presence of coronary angioplasty implant and graft: Secondary | ICD-10-CM | POA: Diagnosis not present

## 2016-11-13 DIAGNOSIS — I2121 ST elevation (STEMI) myocardial infarction involving left circumflex coronary artery: Secondary | ICD-10-CM

## 2016-11-13 DIAGNOSIS — I252 Old myocardial infarction: Secondary | ICD-10-CM | POA: Diagnosis not present

## 2016-11-13 DIAGNOSIS — F17210 Nicotine dependence, cigarettes, uncomplicated: Secondary | ICD-10-CM | POA: Diagnosis not present

## 2016-11-13 DIAGNOSIS — Z7982 Long term (current) use of aspirin: Secondary | ICD-10-CM | POA: Diagnosis not present

## 2016-11-15 ENCOUNTER — Encounter (HOSPITAL_COMMUNITY): Payer: Medicare Other

## 2016-11-20 ENCOUNTER — Encounter (HOSPITAL_COMMUNITY)
Admission: RE | Admit: 2016-11-20 | Discharge: 2016-11-20 | Disposition: A | Discharge status: Home / Self Care | Payer: Medicare Other | Source: Ambulatory Visit | Attending: Interventional Cardiology | Admitting: Interventional Cardiology

## 2016-11-20 DIAGNOSIS — Z7982 Long term (current) use of aspirin: Secondary | ICD-10-CM | POA: Insufficient documentation

## 2016-11-20 DIAGNOSIS — F17210 Nicotine dependence, cigarettes, uncomplicated: Secondary | ICD-10-CM | POA: Diagnosis not present

## 2016-11-20 DIAGNOSIS — I2121 ST elevation (STEMI) myocardial infarction involving left circumflex coronary artery: Secondary | ICD-10-CM

## 2016-11-20 DIAGNOSIS — I252 Old myocardial infarction: Secondary | ICD-10-CM | POA: Diagnosis not present

## 2016-11-20 DIAGNOSIS — I2510 Atherosclerotic heart disease of native coronary artery without angina pectoris: Secondary | ICD-10-CM | POA: Insufficient documentation

## 2016-11-20 DIAGNOSIS — Z955 Presence of coronary angioplasty implant and graft: Secondary | ICD-10-CM | POA: Insufficient documentation

## 2016-11-20 DIAGNOSIS — I255 Ischemic cardiomyopathy: Secondary | ICD-10-CM | POA: Diagnosis not present

## 2016-11-20 DIAGNOSIS — F419 Anxiety disorder, unspecified: Secondary | ICD-10-CM | POA: Diagnosis not present

## 2016-11-20 DIAGNOSIS — Z79899 Other long term (current) drug therapy: Secondary | ICD-10-CM | POA: Diagnosis not present

## 2016-11-20 DIAGNOSIS — Z7902 Long term (current) use of antithrombotics/antiplatelets: Secondary | ICD-10-CM | POA: Insufficient documentation

## 2016-11-22 ENCOUNTER — Encounter (HOSPITAL_COMMUNITY)
Admission: RE | Admit: 2016-11-22 | Discharge: 2016-11-22 | Disposition: A | Discharge status: Home / Self Care | Payer: Medicare Other | Source: Ambulatory Visit | Attending: Interventional Cardiology | Admitting: Interventional Cardiology

## 2016-11-22 DIAGNOSIS — F17210 Nicotine dependence, cigarettes, uncomplicated: Secondary | ICD-10-CM | POA: Diagnosis not present

## 2016-11-22 DIAGNOSIS — Z7982 Long term (current) use of aspirin: Secondary | ICD-10-CM | POA: Diagnosis not present

## 2016-11-22 DIAGNOSIS — I2121 ST elevation (STEMI) myocardial infarction involving left circumflex coronary artery: Secondary | ICD-10-CM

## 2016-11-22 DIAGNOSIS — I2510 Atherosclerotic heart disease of native coronary artery without angina pectoris: Secondary | ICD-10-CM | POA: Diagnosis not present

## 2016-11-22 DIAGNOSIS — Z955 Presence of coronary angioplasty implant and graft: Secondary | ICD-10-CM

## 2016-11-22 DIAGNOSIS — Z79899 Other long term (current) drug therapy: Secondary | ICD-10-CM | POA: Diagnosis not present

## 2016-11-22 DIAGNOSIS — I252 Old myocardial infarction: Secondary | ICD-10-CM | POA: Diagnosis not present

## 2016-11-27 ENCOUNTER — Encounter (HOSPITAL_COMMUNITY)
Admission: RE | Admit: 2016-11-27 | Discharge: 2016-11-27 | Disposition: A | Discharge status: Home / Self Care | Payer: Medicare Other | Source: Ambulatory Visit

## 2016-11-27 DIAGNOSIS — I2510 Atherosclerotic heart disease of native coronary artery without angina pectoris: Secondary | ICD-10-CM | POA: Insufficient documentation

## 2016-11-27 DIAGNOSIS — F17210 Nicotine dependence, cigarettes, uncomplicated: Secondary | ICD-10-CM | POA: Diagnosis not present

## 2016-11-27 DIAGNOSIS — Z955 Presence of coronary angioplasty implant and graft: Secondary | ICD-10-CM

## 2016-11-27 DIAGNOSIS — Z7982 Long term (current) use of aspirin: Secondary | ICD-10-CM | POA: Diagnosis not present

## 2016-11-27 DIAGNOSIS — I2121 ST elevation (STEMI) myocardial infarction involving left circumflex coronary artery: Secondary | ICD-10-CM

## 2016-11-27 DIAGNOSIS — Z79899 Other long term (current) drug therapy: Secondary | ICD-10-CM | POA: Diagnosis not present

## 2016-11-27 DIAGNOSIS — I252 Old myocardial infarction: Secondary | ICD-10-CM | POA: Diagnosis not present

## 2016-11-28 ENCOUNTER — Ambulatory Visit (INDEPENDENT_AMBULATORY_CARE_PROVIDER_SITE_OTHER): Payer: Medicare Other | Admitting: Interventional Cardiology

## 2016-11-28 VITALS — BP 118/76 | HR 69 | Ht 65.0 in | Wt 151.0 lb

## 2016-11-28 DIAGNOSIS — I5032 Chronic diastolic (congestive) heart failure: Secondary | ICD-10-CM | POA: Diagnosis not present

## 2016-11-28 DIAGNOSIS — I2510 Atherosclerotic heart disease of native coronary artery without angina pectoris: Secondary | ICD-10-CM

## 2016-11-28 DIAGNOSIS — F4321 Adjustment disorder with depressed mood: Secondary | ICD-10-CM | POA: Diagnosis not present

## 2016-11-28 DIAGNOSIS — Z720 Tobacco use: Secondary | ICD-10-CM | POA: Diagnosis not present

## 2016-11-28 DIAGNOSIS — E785 Hyperlipidemia, unspecified: Secondary | ICD-10-CM | POA: Diagnosis not present

## 2016-11-28 DIAGNOSIS — I252 Old myocardial infarction: Secondary | ICD-10-CM | POA: Diagnosis not present

## 2016-11-28 DIAGNOSIS — I255 Ischemic cardiomyopathy: Secondary | ICD-10-CM | POA: Diagnosis not present

## 2016-11-28 LAB — LIPID PANEL
CHOLESTEROL TOTAL: 124 mg/dL (ref 100–199)
Chol/HDL Ratio: 1.6 ratio (ref 0.0–4.4)
HDL: 80 mg/dL (ref >39)
LDL Calculated: 26 mg/dL (ref 0–99)
TRIGLYCERIDES: 89 mg/dL (ref 0–149)
VLDL Cholesterol Cal: 18 mg/dL (ref 5–40)

## 2016-11-28 LAB — HEPATIC FUNCTION PANEL
ALBUMIN: 4.3 g/dL (ref 3.6–4.8)
ALK PHOS: 99 IU/L (ref 39–117)
ALT: 22 IU/L (ref 0–32)
AST: 20 IU/L (ref 0–40)
Bilirubin Total: 0.5 mg/dL (ref 0.0–1.2)
Bilirubin, Direct: 0.16 mg/dL (ref 0.00–0.40)
TOTAL PROTEIN: 6.7 g/dL (ref 6.0–8.5)

## 2016-11-28 NOTE — Patient Instructions (Signed)
Medication Instructions:  Your physician recommends that you continue on your current medications as directed. Please refer to the Current Medication list given to you today.   Labwork: Your physician recommends that you to get  lab work today: Hepatic (Liver) and Lipid   Testing/Procedures: None ordered  Follow-Up: Your physician wants you to follow-up in 6 months with Dr. Tamala Julian. You will receive a reminder letter in the mail two months in advance. If you don't receive a letter, please call our office to schedule the follow-up appointment.   Any Other Special Instructions Will Be Listed Below (If Applicable).     If you need a refill on your cardiac medications before your next appointment, please call your pharmacy.

## 2016-11-28 NOTE — Progress Notes (Signed)
Cardiology Office Note    Date:  11/28/2016   ID:  Carol Ferguson, DOB [birth year], MRN [id number]  PCP:  [name], PA-C  Cardiologist: Sinclair Grooms, MD   Chief Complaint  Patient presents with  . Coronary Artery Disease    History of Present Illness:  Carol Ferguson is a 66 y.o. female with history of tobacco abuse, chronic low back pain and recently diagnosed CAD during acute coronary syndrome 09/2016 treated with DES to circumflex.    She is doing well. She is but this pain in cardiac rehabilitation. She is accompanied by a niece. The patient lives alone. She is awakening frequently at night, then unable to return to sleep. She is not having exertional intolerance. She discontinued cigarette smoking. There are no medication side effects. She denies chest pain, orthopnea, and PND. She has not noted lower extremity swelling.   Past Medical History:  Diagnosis Date  . Anxiety   . CAD (coronary artery disease)    a. Inf STEMI/LHC 09/2016 which showed occluded mid LCx treated with PCI, DES; remaining cath details included a proximal to mid LAD lesion of 20% stenosis, and proximal to mid RCA 25% stenosis. EF 55-60%.  . Depression   . Ischemic cardiomyopathy    a. 2D Echo 09/28/16 showed EF 55-60%, probable severe hypokinesis of the entire inferiormyocardium, normal diastolic parameters.  . Myocardial infarction ([place]) 09/26/2016  . Tobacco abuse     Past Surgical History:  Procedure Laterality Date  . CHOLECYSTECTOMY    . CORONARY STENT INTERVENTION N/A 09/26/2016   Procedure: Coronary Stent Intervention;  Surgeon: Belva Crome, MD;  Location: [facility];  Service: Cardiovascular;  Laterality: N/A;  . LEFT HEART CATH AND CORONARY ANGIOGRAPHY N/A 09/26/2016   Procedure: Left Heart Cath and Coronary Angiography;  Surgeon: Belva Crome, MD;  Location: [facility];  Service: Cardiovascular;  Laterality: N/A;    Current Medications: Outpatient  Medications Prior to Visit  Medication Sig Dispense Refill  . acetaminophen (TYLENOL) 500 MG tablet Take 500 mg by mouth every 6 (six) hours as needed.    Marland Kitchen aspirin 81 MG chewable tablet Chew 1 tablet (81 mg total) by mouth daily. 30 tablet 11  . atorvastatin (LIPITOR) 40 MG tablet Take 1 tablet (40 mg total) by mouth daily at 6 PM. 30 tablet 11  . BRILINTA 90 MG TABS tablet TAKE 1 TABLET BY MOUTH TWICE A DAY 60 tablet 10  . clonazePAM (KLONOPIN) 0.5 MG tablet Take 0.5 tablets (0.25 mg total) by mouth 2 (two) times daily as needed for anxiety. 30 tablet 0  . FLUoxetine (PROZAC) 10 MG capsule Take 1 capsule (10 mg total) by mouth daily. 90 capsule 0  . nitroGLYCERIN (NITROSTAT) 0.4 MG SL tablet Place 1 tablet (0.4 mg total) under the tongue every 5 (five) minutes x 3 doses as needed for chest pain. 25 tablet 11  . ticagrelor (BRILINTA) 90 MG TABS tablet Take 1 tablet (90 mg total) by mouth 2 (two) times daily. 60 tablet 11  . metoprolol succinate (TOPROL XL) 25 MG 24 hr tablet Take 0.5 tablets (12.5 mg total) by mouth at bedtime. (Patient not taking: Reported on 11/28/2016) 30 tablet 11   No facility-administered medications prior to visit.      Allergies:   Patient has no known allergies.   Social History   Social History  . Marital status: Divorced    Spouse name: N/A  . Number  of children: N/A  . Years of education: N/A   Social History Main Topics  . Smoking status: Former Smoker    Packs/day: 1.00    Years: 35.00    Types: Cigarettes    Quit date: 09/26/2016  . Smokeless tobacco: Never Used  . Alcohol use Yes     Comment: occasional  . Drug use: No  . Sexual activity: Not on file   Other Topics Concern  . Not on file   Social History Narrative  . No narrative on file     Family History:  The patient's family history includes Cancer in her brother; Heart attack in her brother; Heart disease in her brother and father; Stroke in her father.   ROS:   Please see the history  of present illness.    Some bloating abdominal discomfort. Anxiety, depression, and tearfulness. She is frightened and having some insomnia. She is but this pain in cardiac rehabilitation without limitations.  All other systems reviewed and are negative.   PHYSICAL EXAM:   VS:  BP 118/76   Pulse 69   Ht 5\' 5"  (1.651 m)   Wt 151 lb (68.5 kg)   BMI 25.13 kg/m    GEN: Well nourished, well developed, in no acute distress  HEENT: normal  Neck: no JVD, carotid bruits, or masses Cardiac: RRR; no murmurs, rubs, or gallops,no edema  Respiratory:  clear to auscultation bilaterally, normal work of breathing GI: soft, nontender, nondistended, + BS MS: no deformity or atrophy  Skin: warm and dry, no rash Neuro:  Alert and Oriented x 3, Strength and sensation are intact Psych: euthymic mood, full affect  Wt Readings from Last 3 Encounters:  11/28/16 151 lb (68.5 kg)  11/06/16 152 lb 6.4 oz (69.1 kg)  10/08/16 159 lb 4 oz (72.2 kg)      Studies/Labs Reviewed:   EKG:  EKG  Not repeated  Recent Labs: 09/26/2016: TSH 0.999 09/27/2016: BUN 13; Creatinine, Ser 0.80; Hemoglobin 13.8; Platelets 181; Potassium 3.6; Sodium 139 10/08/2016: ALT 18   Lipid Panel    Component Value Date/Time   CHOL 144 09/27/2016 0407   TRIG 102 09/27/2016 0407   HDL 56 09/27/2016 0407   CHOLHDL 2.6 09/27/2016 0407   VLDL 20 09/27/2016 0407   LDLCALC 68 09/27/2016 0407    Additional studies/ records that were reviewed today include:  Coronary Diagrams   Diagnostic Diagram       Post-Intervention Diagram         Echocardiogram 09/27/2016: Study Conclusions  - Left ventricle: The cavity size was normal. Systolic function was   normal. The estimated ejection fraction was in the range of 55%   to 60%. Probable severe hypokinesis of the entireinferior   myocardium. Left ventricular diastolic function parameters were   normal. - Aortic valve: Poorly visualized. - Atrial septum: There was increased  thickness of the septum,   consistent with lipomatous hypertrophy.  ASSESSMENT:    1. Coronary artery disease involving native coronary artery of native heart without angina pectoris   2. Hyperlipidemia with target LDL less than 70   3. Tobacco abuse   4. Chronic diastolic heart failure ([place])   5. Situational depression   6. 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 Patient has single-vessel coronary disease treated with drug-eluting stent to the distal circumflex in the setting of acute infarction in June 2018. She had a  very nice angiographic result. She has had no recurrent symptoms. 2. LDL target is less than 70. LDL in the hospital was 68 for lipid therapy was started. We will recheck a liver and lipid today. Medication adjustment may be required. She will need to remain on a statin however to gain full benefit from the protective effects of this medication. 3. She has not smoked cigarettes. She is encouraged not to resume. 4. No evidence of volume overload. No symptoms to suggest heart failure. 5. Encouraged the patient to resume her typical lifestyle. Discussed the high prevalence of depression associated with cardiac events. I do not yet feel medication therapy as needed. I believe she will get better as time goes along.  Plan clinical follow-up in 6 months.  Greater than 50% of this prolonged office visit was spent in counseling, especially around depression related to her recent myocardial infarction.    Medication Adjustments/Labs and Tests Ordered: Current medicines are reviewed at length with the patient today.  Concerns regarding medicines are outlined above.  Medication changes, Labs and Tests ordered today are listed in the Patient Instructions below. Patient Instructions  Medication Instructions:  Your physician recommends that you continue on your current medications as directed. Please refer to the Current  Medication list given to you today.   Labwork: Your physician recommends that you to get  lab work today: Hepatic (Liver) and Lipid   Testing/Procedures: None ordered  Follow-Up: Your physician wants you to follow-up in 6 months with Dr. Tamala Julian. You will receive a reminder letter in the mail two months in advance. If you don't receive a letter, please call our office to schedule the follow-up appointment.   Any Other Special Instructions Will Be Listed Below (If Applicable).     If you need a refill on your cardiac medications before your next appointment, please call your pharmacy.      Signed, Sinclair Grooms, MD  11/28/2016 8:58 AM    [facility] [place], [place], [place]  [address] Phone: [phone]; Fax: [phone]

## 2016-11-29 ENCOUNTER — Encounter (HOSPITAL_COMMUNITY)
Admission: RE | Admit: 2016-11-29 | Discharge: 2016-11-29 | Disposition: A | Discharge status: Home / Self Care | Payer: Medicare Other | Source: Ambulatory Visit | Attending: Interventional Cardiology | Admitting: Interventional Cardiology

## 2016-11-29 DIAGNOSIS — Z955 Presence of coronary angioplasty implant and graft: Secondary | ICD-10-CM | POA: Diagnosis not present

## 2016-11-29 DIAGNOSIS — I252 Old myocardial infarction: Secondary | ICD-10-CM | POA: Diagnosis not present

## 2016-11-29 DIAGNOSIS — I2121 ST elevation (STEMI) myocardial infarction involving left circumflex coronary artery: Secondary | ICD-10-CM

## 2016-11-29 DIAGNOSIS — Z7982 Long term (current) use of aspirin: Secondary | ICD-10-CM | POA: Diagnosis not present

## 2016-11-29 DIAGNOSIS — I2510 Atherosclerotic heart disease of native coronary artery without angina pectoris: Secondary | ICD-10-CM | POA: Diagnosis not present

## 2016-11-29 DIAGNOSIS — F17210 Nicotine dependence, cigarettes, uncomplicated: Secondary | ICD-10-CM | POA: Diagnosis not present

## 2016-11-29 DIAGNOSIS — Z79899 Other long term (current) drug therapy: Secondary | ICD-10-CM | POA: Diagnosis not present

## 2016-12-02 ENCOUNTER — Telehealth: Payer: Self-pay | Admitting: *Deleted

## 2016-12-02 MED ORDER — ATORVASTATIN CALCIUM 10 MG PO TABS: 10.0000 mg | ORAL_TABLET | Freq: Every day | ORAL | 3 refills | Status: DC

## 2016-12-02 NOTE — Telephone Encounter (Signed)
-----   Message from Belva Crome, MD sent at 11/30/2016  1:14 PM EDT ----- Let the patient know labs are better than target. Decrease the atorvastatin to 10 mg daily. A copy will be sent to [name], PA-C

## 2016-12-02 NOTE — Telephone Encounter (Signed)
Spoke with pt and went over labs and recommendations per Dr. Tamala Julian. Pt verbalized understanding and was in agreement with this plan.

## 2016-12-03 ENCOUNTER — Ambulatory Visit (INDEPENDENT_AMBULATORY_CARE_PROVIDER_SITE_OTHER): Payer: Medicare Other | Admitting: Physician Assistant

## 2016-12-03 ENCOUNTER — Encounter: Payer: Self-pay | Admitting: Physician Assistant

## 2016-12-03 VITALS — BP 104/68 | HR 77 | Temp 98.2°F | Resp 17 | Ht 65.0 in | Wt 150.0 lb

## 2016-12-03 DIAGNOSIS — N898 Other specified noninflammatory disorders of vagina: Secondary | ICD-10-CM | POA: Diagnosis not present

## 2016-12-03 DIAGNOSIS — I255 Ischemic cardiomyopathy: Secondary | ICD-10-CM | POA: Diagnosis not present

## 2016-12-03 DIAGNOSIS — F419 Anxiety disorder, unspecified: Secondary | ICD-10-CM

## 2016-12-03 DIAGNOSIS — B9689 Other specified bacterial agents as the cause of diseases classified elsewhere: Secondary | ICD-10-CM | POA: Diagnosis not present

## 2016-12-03 DIAGNOSIS — R109 Unspecified abdominal pain: Secondary | ICD-10-CM | POA: Diagnosis not present

## 2016-12-03 DIAGNOSIS — F32A Depression, unspecified: Secondary | ICD-10-CM

## 2016-12-03 DIAGNOSIS — N760 Acute vaginitis: Secondary | ICD-10-CM | POA: Diagnosis not present

## 2016-12-03 DIAGNOSIS — F329 Major depressive disorder, single episode, unspecified: Secondary | ICD-10-CM | POA: Diagnosis not present

## 2016-12-03 LAB — POC MICROSCOPIC URINALYSIS (UMFC)

## 2016-12-03 LAB — POCT CBC
GRANULOCYTE PERCENT: 68.4 % (ref 37–80)
HEMATOCRIT: 45.3 % (ref 37.7–47.9)
Hemoglobin: 15.4 g/dL (ref 12.2–16.2)
Lymph, poc: 1.8 (ref 0.6–3.4)
MCH: 33.7 pg — AB (ref 27–31.2)
MCHC: 33.9 g/dL (ref 31.8–35.4)
MCV: 99.2 fL — AB (ref 80–97)
MID (CBC): 0.7 (ref 0–0.9)
MPV: 8.4 fL (ref 0–99.8)
PLATELET COUNT, POC: 245 10*3/uL (ref 142–424)
POC GRANULOCYTE: 5.4 (ref 2–6.9)
POC LYMPH %: 23 % (ref 10–50)
POC MID %: 8.6 %M (ref 0–12)
RBC: 4.57 M/uL (ref 4.04–5.48)
RDW, POC: 13.3 %
WBC: 7.9 10*3/uL (ref 4.6–10.2)

## 2016-12-03 LAB — POCT URINALYSIS DIP (MANUAL ENTRY)
BILIRUBIN UA: NEGATIVE mg/dL
GLUCOSE UA: NEGATIVE mg/dL
Leukocytes, UA: NEGATIVE
Nitrite, UA: NEGATIVE
Protein Ur, POC: 30 mg/dL — AB
UROBILINOGEN UA: 0.2 U/dL
pH, UA: 5 (ref 5.0–8.0)

## 2016-12-03 LAB — POCT WET + KOH PREP
TRICH BY WET PREP: ABSENT
YEAST BY KOH: ABSENT
Yeast by wet prep: ABSENT

## 2016-12-03 MED ORDER — METRONIDAZOLE 500 MG PO TABS: 500.0000 mg | ORAL_TABLET | Freq: Two times a day (BID) | ORAL | 0 refills | Status: DC

## 2016-12-03 MED ORDER — FLUOXETINE HCL 10 MG PO CAPS
10.0000 mg | ORAL_CAPSULE | Freq: Every day | ORAL | 0 refills | Status: DC
Start: 2016-12-03 — End: 2017-04-27

## 2016-12-03 NOTE — Patient Instructions (Addendum)
For anxiety, continue taking prozac as prescribed. Use klonopin as needed. I have sent in a refill for prozac. Plan to follow up in office in 3 months for reevaluation. Call sooner if you need a refill for klonopin.  For bacterial vaginosis, please take flagyl as prescribed. Do not consume alcohol while taking this medication.   For lower abdomen pain, I have placed an order for an ultrasound and they should contact you with this appointment in a few days. I have also placed a referral to gynecology and they should contact you within 1-2 weeks with an appointment. If any of your symptoms worsen or you develop new concerning symptoms please seek care immediately.  Thank you for letting me participate in your health and well being.    Bacterial Vaginosis Bacterial vaginosis is an infection of the vagina. It happens when too many germs (bacteria) grow in the vagina. This infection puts you at risk for infections from sex (STIs). Treating this infection can lower your risk for some STIs. You should also treat this if you are pregnant. It can cause your baby to be born early. Follow these instructions at home: Medicines  Take over-the-counter and prescription medicines only as told by your doctor.  Take or use your antibiotic medicine as told by your doctor. Do not stop taking or using it even if you start to feel better. General instructions  If you your sexual partner is a woman, tell her that you have this infection. She needs to get treatment if she has symptoms. If you have a female partner, he does not need to be treated.  During treatment: ? Avoid sex. ? Do not douche. ? Avoid alcohol as told. ? Avoid breastfeeding as told.  Drink enough fluid to keep your pee (urine) clear or pale yellow.  Keep your vagina and butt (rectum) clean. ? Wash the area with warm water every day. ? Wipe from front to back after you use the toilet.  Keep all follow-up visits as told by your doctor. This is  important. Preventing this condition  Do not douche.  Use only warm water to wash around your vagina.  Use protection when you have sex. This includes: ? Latex condoms. ? Dental dams.  Limit how many people you have sex with. It is best to only have sex with the same person (be monogamous).  Get tested for STIs. Have your partner get tested.  Wear underwear that is cotton or lined with cotton.  Avoid tight pants and pantyhose. This is most important in summer.  Do not use any products that have nicotine or tobacco in them. These include cigarettes and e-cigarettes. If you need help quitting, ask your doctor.  Do not use illegal drugs.  Limit how much alcohol you drink. Contact a doctor if:  Your symptoms do not get better, even after you are treated.  You have more discharge or pain when you pee (urinate).  You have a fever.  You have pain in your belly (abdomen).  You have pain with sex.  Your bleed from your vagina between periods. Summary  This infection happens when too many germs (bacteria) grow in the vagina.  Treating this condition can lower your risk for some infections from sex (STIs).  You should also treat this if you are pregnant. It can cause early (premature) birth.  Do not stop taking or using your antibiotic medicine even if you start to feel better. This information is not intended to replace advice  given to you by your health care provider. Make sure you discuss any questions you have with your health care provider. Document Released: 01/16/2008 Document Revised: 12/23/2015 Document Reviewed: 12/23/2015 Elsevier Interactive Patient Education  2017 [employer].   IF you received an x-ray today, you will receive an invoice from [facility]. Please contact [facility] at [phone] with questions or concerns regarding your invoice.   IF you received labwork today, you will receive an invoice from [facility]. Please contact  [facility] at [phone] with questions or concerns regarding your invoice.   Our billing staff will not be able to assist you with questions regarding bills from these companies.  You will be contacted with the lab results as soon as they are available. The fastest way to get your results is to activate your My Chart account. Instructions are located on the last page of this paperwork. If you have not heard from [place] regarding the results in 2 weeks, please contact this office.

## 2016-12-03 NOTE — Progress Notes (Signed)
MRN: [id number] DOB: 09-14-1950  Subjective:   Carol Ferguson is a 66 y.o. female presenting for follow up on depression and anxiety. Initially evaluated by me in 10/2016 for worsening anxiety and depression since MI in 09/2016. She was having panic attacks at this time. Had tried wellbutrin which made them significantly worse. She stopped medication, came to see me. Had tried prozac in the past and had done very well with it. She was restarted on prozac 10mg  dialy and given Rx for klonopin to use during bouts of panic attacks. Since last visit, she notes she is doing much better. Her panic attacks have decreased in frequency. She is still experiencing some bouts of panic attacks during the night but notes she will take half of a klonopin and her symptoms quickly resolve. Notes she still has about 12 tablets of klonopin left. She denies nausea, vomiting, chest pain, heart palpitations, dizziness, suicidal, and homicidal thoughts.    She would also like to discuss her abdominal discomfort x months. Noticed since 09/2016 after she had multiple heparin shots during hospitalizaton for MI. However, she does endorse abdominal bloating x 5-6 months prior to her MI. Describes it as an aching in the lower abdomen. Has associated decreased appetite and weight loss of 18 lbs. Has tried tylenol which will help.  Diet consists of peanut butter sandwich, crackers, boiled chicken, green beans, salad, is typical diet. Drinks more water now. Denies heartburn, hematachezia,melana, belching, nausea, vomiting, fever, and chills, Does have looser stools now. Has one bowel movement daily.  She also has had some vaginal discharge x 3 weeks, yellow discoloration. No odor or itching.She is not sexually active. Nulliparous. She has never had a colonoscopy. Last pap in 2012-negative for intraepithelial lesions. PSH of cholesystecomy. No hx fibroids or IBS. 38 pack year history, quit smoking a few months ago. No excessive NSAID or  alcohol use.No FH of uterine cancer, ovarian cancer, colon cancer, or IBS.   Review of Systems  Constitutional: Negative for malaise/fatigue.  Genitourinary: Negative for dysuria, frequency, hematuria and urgency.  Neurological: Negative for weakness.      Carol Ferguson has a current medication list which includes the following prescription(s): acetaminophen, aspirin, atorvastatin, brilinta, clonazepam, fluoxetine, nitroglycerin, and metronidazole. Also has No Known Allergies.  Carol Ferguson  has a past medical history of Anxiety; CAD (coronary artery disease); Depression; Ischemic cardiomyopathy; Myocardial infarction ([place]) (09/26/2016); and Tobacco abuse. Also  has a past surgical history that includes Cholecystectomy; LEFT HEART CATH AND CORONARY ANGIOGRAPHY (N/A, 09/26/2016); and CORONARY STENT INTERVENTION (N/A, 09/26/2016).   Objective:   Vitals: BP 104/68   Pulse 77   Temp 98.2 F (36.8 C) (Oral)   Resp 17   Ht 5\' 5"  (1.651 m)   Wt 150 lb (68 kg)   SpO2 98%   BMI 24.96 kg/m   Physical Exam  Constitutional: She is oriented to person, place, and time. She appears well-developed and well-nourished. No distress.  HENT:  Head: Normocephalic and atraumatic.  Eyes: Conjunctivae are normal.  Neck: Normal range of motion.  Pulmonary/Chest: Effort normal.  Abdominal: Soft. Normal appearance and bowel sounds are normal. She exhibits no distension. There is tenderness (mild with deep palpation) in the right lower quadrant and left lower quadrant. There is no rigidity, no rebound, no guarding and no tenderness at McBurney's point.  Genitourinary: Uterus is enlarged. Uterus is not tender. Right adnexum displays no tenderness. Left adnexum displays no tenderness. Vaginal discharge (whitish yellowish discharge noted in vaginal canal)  found.  Genitourinary Comments: Mild bluish/reddish discoloration of cervix. Firm cervix to palpation.   Neurological: She is alert and oriented to person, place, and time.    Skin: Skin is warm and dry.  Psychiatric: She has a normal mood and affect.  Vitals reviewed.  Results for orders placed or performed in visit on 12/03/16 (from the past 24 hour(s))  POCT CBC     Status: Abnormal   Collection Time: 12/03/16  9:00 AM  Result Value Ref Range   WBC 7.9 4.6 - 10.2 K/uL   Lymph, poc 1.8 0.6 - 3.4   POC LYMPH PERCENT 23.0 10 - 50 %L   MID (cbc) 0.7 0 - 0.9   POC MID % 8.6 0 - 12 %M   POC Granulocyte 5.4 2 - 6.9   Granulocyte percent 68.4 37 - 80 %G   RBC 4.57 4.04 - 5.48 M/uL   Hemoglobin 15.4 12.2 - 16.2 g/dL   HCT, POC 45.3 37.7 - 47.9 %   MCV 99.2 (A) 80 - 97 fL   MCH, POC 33.7 (A) 27 - 31.2 pg   MCHC 33.9 31.8 - 35.4 g/dL   RDW, POC 13.3 %   Platelet Count, POC 245 142 - 424 K/uL   MPV 8.4 0 - 99.8 fL  POCT Wet + KOH Prep     Status: Abnormal   Collection Time: 12/03/16  9:01 AM  Result Value Ref Range   Yeast by KOH Absent Absent   Yeast by wet prep Absent Absent   WBC by wet prep None (A) Few   Clue Cells Wet Prep HPF POC Few (A) None   Trich by wet prep Absent Absent   Bacteria Wet Prep HPF POC Few Few   Epithelial Cells By [employer] ([facility]) Many (A) None, Few, Too numerous to count   RBC,UR,HPF,POC Many (A) None RBC/hpf  POCT urinalysis dipstick     Status: Abnormal   Collection Time: 12/03/16  9:17 AM  Result Value Ref Range   Color, UA yellow yellow   Clarity, UA cloudy (A) clear   Glucose, UA negative negative mg/dL   Bilirubin, UA small (A) negative   Ketones, POC UA negative negative mg/dL   Spec Grav, UA >=1.030 (A) 1.010 - 1.025   Blood, UA moderate (A) negative   pH, UA 5.0 5.0 - 8.0   Protein Ur, POC =30 (A) negative mg/dL   Urobilinogen, UA 0.2 0.2 or 1.0 E.U./dL   Nitrite, UA Negative Negative   Leukocytes, UA Negative Negative  POCT Microscopic Urinalysis ([facility])     Status: Abnormal   Collection Time: 12/03/16  2:12 PM  Result Value Ref Range   WBC,UR,HPF,POC None None WBC/hpf   RBC,UR,HPF,POC None None RBC/hpf    Bacteria Few (A) None, Too numerous to count   Mucus Present (A) Absent   Epithelial Cells, UR Per Microscopy Few (A) None, Too numerous to count cells/hpf    Assessment and Plan :  1. Anxiety and depression Well controlled at this time. Continue prozac 10mg  daily. Klonopin prn. Follow up in 3 months for reevaluation.  - FLUoxetine (PROZAC) 10 MG capsule; Take 1 capsule (10 mg total) by mouth daily.  Dispense: 90 capsule; Refill: 0 - Care order/instruction:  2. Vaginal discharge - POCT Wet + KOH Prep  3. Abdominal discomfort Unclear etiology at this time. Pt appears stable. Vitals are normal. WBC and Hgb wnl. PE findings of enlarged uterus, abnormal appearance of cervix, and hx concerning for  gyn etiology. Will schedule transvaginal and pelvis US. Referral to gyn also placed for further evaluation. Pt given strict ED precautions while awaiting [place]. If no abnormal findings on transvaginal and pelvis US, will place referral for colonoscopy.  - POCT CBC - POCT urinalysis dipstick - US Transvaginal Non-OB; Future - US Pelvis Complete; Future - Ambulatory referral to Gynecology  4. Bacterial vaginosis POCT shows clue cells, will treat for BV at this time as patient has been experiencing vaginal discharge for 3 weeks.  - metroNIDAZOLE (FLAGYL) 500 MG tablet; Take 1 tablet (500 mg total) by mouth 2 (two) times daily with a meal. DO NOT CONSUME ALCOHOL WHILE TAKING THIS MEDICATION.  Dispense: 14 tablet; Refill: 0  Tenna Delaine, PA-C  [facility] [place] 12/03/2016 7:20 PM

## 2016-12-04 ENCOUNTER — Encounter (HOSPITAL_COMMUNITY)
Admission: RE | Admit: 2016-12-04 | Discharge: 2016-12-04 | Disposition: A | Discharge status: Home / Self Care | Payer: Medicare Other | Source: Ambulatory Visit | Attending: Interventional Cardiology | Admitting: Interventional Cardiology

## 2016-12-04 DIAGNOSIS — F17210 Nicotine dependence, cigarettes, uncomplicated: Secondary | ICD-10-CM | POA: Diagnosis not present

## 2016-12-04 DIAGNOSIS — Z955 Presence of coronary angioplasty implant and graft: Secondary | ICD-10-CM

## 2016-12-04 DIAGNOSIS — Z79899 Other long term (current) drug therapy: Secondary | ICD-10-CM | POA: Diagnosis not present

## 2016-12-04 DIAGNOSIS — I252 Old myocardial infarction: Secondary | ICD-10-CM | POA: Diagnosis not present

## 2016-12-04 DIAGNOSIS — I2510 Atherosclerotic heart disease of native coronary artery without angina pectoris: Secondary | ICD-10-CM | POA: Diagnosis not present

## 2016-12-04 DIAGNOSIS — Z7982 Long term (current) use of aspirin: Secondary | ICD-10-CM | POA: Diagnosis not present

## 2016-12-04 DIAGNOSIS — I2121 ST elevation (STEMI) myocardial infarction involving left circumflex coronary artery: Secondary | ICD-10-CM

## 2016-12-05 NOTE — Progress Notes (Signed)
Cardiac Individual Treatment Plan  Patient Details  Name: Carol Ferguson MRN: [id number] Date of Birth: Aug 22, 1950 Referring Provider:     [place] from 10/24/2016 in [place]  Referring Provider  Daneen Schick MD      Initial Encounter Date:    CARDIAC REHAB PHASE II ORIENTATION from 10/24/2016 in [place]  Date  10/24/16  Referring Provider  Daneen Schick MD      Visit Diagnosis: ST elevation myocardial infarction involving left circumflex coronary artery [facility])  Status post coronary artery stent placement  Patient's Home Medications on Admission:  Current Outpatient Prescriptions:  .  acetaminophen (TYLENOL) 500 MG tablet, Take 500 mg by mouth every 6 (six) hours as needed., Disp: , Rfl:  .  aspirin 81 MG chewable tablet, Chew 1 tablet (81 mg total) by mouth daily., Disp: 30 tablet, Rfl: 11 .  atorvastatin (LIPITOR) 10 MG tablet, Take 1 tablet (10 mg total) by mouth daily., Disp: 90 tablet, Rfl: 3 .  BRILINTA 90 MG TABS tablet, TAKE 1 TABLET BY MOUTH TWICE A DAY, Disp: 60 tablet, Rfl: 10 .  clonazePAM (KLONOPIN) 0.5 MG tablet, Take 0.5 tablets (0.25 mg total) by mouth 2 (two) times daily as needed for anxiety., Disp: 30 tablet, Rfl: 0 .  FLUoxetine (PROZAC) 10 MG capsule, Take 1 capsule (10 mg total) by mouth daily., Disp: 90 capsule, Rfl: 0 .  metroNIDAZOLE (FLAGYL) 500 MG tablet, Take 1 tablet (500 mg total) by mouth 2 (two) times daily with a meal. DO NOT CONSUME ALCOHOL WHILE TAKING THIS MEDICATION., Disp: 14 tablet, Rfl: 0 .  nitroGLYCERIN (NITROSTAT) 0.4 MG SL tablet, Place 1 tablet (0.4 mg total) under the tongue every 5 (five) minutes x 3 doses as needed for chest pain., Disp: 25 tablet, Rfl: 11  Past Medical History: Past Medical History:  Diagnosis Date  . Anxiety   . CAD (coronary artery disease)    a. Inf STEMI/LHC 09/2016 which showed occluded mid LCx treated with PCI, DES;  remaining cath details included a proximal to mid LAD lesion of 20% stenosis, and proximal to mid RCA 25% stenosis. EF 55-60%.  . Depression   . Ischemic cardiomyopathy    a. 2D Echo 09/28/16 showed EF 55-60%, probable severe hypokinesis of the entire inferiormyocardium, normal diastolic parameters.  . Myocardial infarction ([place]) 09/26/2016  . Tobacco abuse     Tobacco Use: History  Smoking Status  . Former Smoker  . Packs/day: 1.00  . Years: 35.00  . Types: Cigarettes  . Quit date: 09/26/2016  Smokeless Tobacco  . Never Used    Labs: Recent Review Flowsheet Data    Labs for ITP Cardiac and Pulmonary Rehab Latest Ref Rng & Units 09/26/2016 09/27/2016 11/28/2016   Cholestrol 100 - 199 mg/dL - 144 124   LDLCALC 0 - 99 mg/dL - 68 26   HDL >39 mg/dL - 56 80   Trlycerides 0 - 149 mg/dL - 102 89   Hemoglobin A1c 4.8 - 5.6 % 5.6 - -      Capillary Blood Glucose: Lab Results  Component Value Date   GLUCAP 113 (H) 10/30/2016     Exercise Target Goals:    Exercise Program Goal: Individual exercise prescription set with THRR, safety & activity barriers. Participant demonstrates ability to understand and report RPE using BORG scale, to self-measure pulse accurately, and to acknowledge the importance of the exercise prescription.  Exercise Prescription Goal: Starting  with aerobic activity 30 plus minutes a day, 3 days per week for initial exercise prescription. Provide home exercise prescription and guidelines that participant acknowledges understanding prior to discharge.  Activity Barriers & Risk Stratification:     Activity Barriers & Cardiac Risk Stratification - 10/24/16 0834      Activity Barriers & Cardiac Risk Stratification   Activity Barriers Back Problems   Cardiac Risk Stratification High      6 Minute Walk:     6 Minute Walk    Row Name 10/24/16 1041         6 Minute Walk   Phase Initial     Distance 1654 feet     Walk Time 6 minutes     # of Rest Breaks 0      MPH 3.1     METS 3.6     RPE 11     VO2 Peak 12.7     Symptoms No     Resting HR 97 bpm     Resting BP 100/60     Max Ex. HR 112 bpm     Max Ex. BP 99/57     2 Minute Post BP 108/60        Oxygen Initial Assessment:   Oxygen Re-Evaluation:   Oxygen Discharge (Final Oxygen Re-Evaluation):   Initial Exercise Prescription:     Initial Exercise Prescription - 10/24/16 1000      Date of Initial Exercise RX and Referring Provider   Date 10/24/16   Referring Provider Daneen Schick MD     Bike   Level 0.7   Minutes 10   METs 3     NuStep   Level 2   Minutes 10   METs 3     Track   Laps 12   Minutes 10   METs 3.1     Prescription Details   Frequency (times per week) 3   Duration Progress to 45 minutes of aerobic exercise without signs/symptoms of physical distress     Intensity   THRR 40-80% of Max Heartrate 61-122   Ratings of Perceived Exertion 11-13   Perceived Dyspnea 0-4     Progression   Progression Continue to progress workloads to maintain intensity without signs/symptoms of physical distress.     Resistance Training   Training Prescription Yes   Weight 2   Reps 10-15      Perform Capillary Blood Glucose checks as needed.  Exercise Prescription Changes:     Exercise Prescription Changes    Row Name 11/04/16 1200 11/29/16 1400           Response to Exercise   Blood Pressure (Admit) 98/72 104/62      Blood Pressure (Exercise) 142/82 94/60      Blood Pressure (Exit) 102/62 98/72      Heart Rate (Admit) 97 bpm 92 bpm      Heart Rate (Exercise) 127 bpm 130 bpm      Heart Rate (Exit) 92 bpm 92 bpm      Rating of Perceived Exertion (Exercise) 13 12      Duration Progress to 45 minutes of aerobic exercise without signs/symptoms of physical distress Progress to 45 minutes of aerobic exercise without signs/symptoms of physical distress      Intensity THRR unchanged THRR unchanged        Progression   Progression Continue to progress  workloads to maintain intensity without signs/symptoms of physical distress. Continue to progress workloads to maintain intensity without  signs/symptoms of physical distress.      Average METs 2.7 2.7        Resistance Training   Training Prescription Yes Yes      Weight 2 2      Reps 10-15 10-15        Bike   Level 0.7 0.7      Minutes 10 10      METs 3 3        NuStep   Level 2 2      Minutes 10 10      METs 3 3        Track   Laps 13 13      Minutes 10 10      METs 3.26 3.26         Exercise Comments:     Exercise Comments    Row Name 11/04/16 1205 11/29/16 1424         Exercise Comments pt is off to a good start with exercise. She c/o of some dizziness towards the end of her first day of exercise, but she said that she hadn't eaten much that day.  Discussed the importance of eating at least 30 min prior to exercise and pt verbalized understanding Reviewed goals and home exercise program         Exercise Goals and Review:     Exercise Goals    Row Name 10/24/16 1045             Exercise Goals   Increase Physical Activity Yes       Intervention Provide advice, education, support and counseling about physical activity/exercise needs.;Develop an individualized exercise prescription for aerobic and resistive training based on initial evaluation findings, risk stratification, comorbidities and participant's personal goals.       Expected Outcomes Achievement of increased cardiorespiratory fitness and enhanced flexibility, muscular endurance and strength shown through measurements of functional capacity and personal statement of participant.       Increase Strength and Stamina Yes       Intervention Provide advice, education, support and counseling about physical activity/exercise needs.;Develop an individualized exercise prescription for aerobic and resistive training based on initial evaluation findings, risk stratification, comorbidities and participant's personal  goals.       Expected Outcomes Achievement of increased cardiorespiratory fitness and enhanced flexibility, muscular endurance and strength shown through measurements of functional capacity and personal statement of participant.          Exercise Goals Re-Evaluation :     Exercise Goals Re-Evaluation    Row Name 11/29/16 1423             Exercise Goal Re-Evaluation   Exercise Goals Review Increase Physical Activity;Increase Strenth and Stamina       Comments Pt states she is walking her dogs 25 minutes/day and she does stretches at home everyday       Expected Outcomes Continue with exericse Rx and home exercise program in order to increase strength and stamina           Discharge Exercise Prescription (Final Exercise Prescription Changes):     Exercise Prescription Changes - 11/29/16 1400      Response to Exercise   Blood Pressure (Admit) 104/62   Blood Pressure (Exercise) 94/60   Blood Pressure (Exit) 98/72   Heart Rate (Admit) 92 bpm   Heart Rate (Exercise) 130 bpm   Heart Rate (Exit) 92 bpm   Rating of Perceived Exertion (Exercise) 12  Duration Progress to 45 minutes of aerobic exercise without signs/symptoms of physical distress   Intensity THRR unchanged     Progression   Progression Continue to progress workloads to maintain intensity without signs/symptoms of physical distress.   Average METs 2.7     Resistance Training   Training Prescription Yes   Weight 2   Reps 10-15     Bike   Level 0.7   Minutes 10   METs 3     NuStep   Level 2   Minutes 10   METs 3     Track   Laps 13   Minutes 10   METs 3.26      Nutrition:  Target Goals: Understanding of nutrition guidelines, daily intake of sodium <1563m, cholesterol <2061m calories 30% from fat and 7% or less from saturated fats, daily to have 5 or more servings of fruits and vegetables.  Biometrics:     Pre Biometrics - 10/24/16 1057      Pre Biometrics   Waist Circumference 34.5 inches    Hip Circumference 43 inches   Waist to Hip Ratio 0.8 %   Triceps Skinfold 38 mm   % Body Fat 39.4 %   Grip Strength 35 kg   Flexibility 18 in   Single Leg Stand 25 seconds       Nutrition Therapy Plan and Nutrition Goals:     Nutrition Therapy & Goals - 10/24/16 1229      Nutrition Therapy   Diet Therapeutic Lifestyle Changes     Personal Nutrition Goals   Nutrition Goal Wt loss goal of 1-2 lb/week to a wt loss goal of 6-24 lb. Pt desired goal wt is 155 lb.      Intervention Plan   Intervention Prescribe, educate and counsel regarding individualized specific dietary modifications aiming towards targeted core components such as weight, hypertension, lipid management, diabetes, heart failure and other comorbidities.   Expected Outcomes Short Term Goal: Understand basic principles of dietary content, such as calories, fat, sodium, cholesterol and nutrients.;Long Term Goal: Adherence to prescribed nutrition plan.      Nutrition Discharge: Nutrition Scores:     Nutrition Assessments - 10/24/16 1229      MEDFICTS Scores   Pre Score 36      Nutrition Goals Re-Evaluation:   Nutrition Goals Re-Evaluation:   Nutrition Goals Discharge (Final Nutrition Goals Re-Evaluation):   Psychosocial: Target Goals: Acknowledge presence or absence of significant depression and/or stress, maximize coping skills, provide positive support system. Participant is able to verbalize types and ability to use techniques and skills needed for reducing stress and depression.  Initial Review & Psychosocial Screening:     Initial Psych Review & Screening - 10/24/16 1320      Initial Review   Current issues with Current Stress Concerns;Current Anxiety/Panic     Family Dynamics   Good Support System? Yes   Concerns --  Recent loss of all my immediate family; divorced for 28 years, use to being independent     Barriers   Psychosocial barriers to participate in program The patient should  benefit from training in stress management and relaxation.     Screening Interventions   Interventions To provide support and resources with identified psychosocial needs  Appt scheduled for BoJeanella Crazeext 7/11       Quality of Life Scores:     Quality of Life - 10/24/16 1100      Quality of Life Scores   Health/Function Pre 20 %  Socioeconomic Pre 22.38 %   Psych/Spiritual Pre 17.75 %   Family Pre 25.5 %   GLOBAL Pre 20.71 %      PHQ-9: Recent Review Flowsheet Data    Depression screen [facility] 2/9 12/03/2016 11/06/2016 10/30/2016 10/30/2016   Decreased Interest 0 0 0 0   Down, Depressed, Hopeless 0 2 1  0   PHQ - 2 Score 0 2 1 0   Altered sleeping 0 3 - -   Tired, decreased energy 0 3 - -   Change in appetite 0 3 - -   Feeling bad or failure about yourself  0 2 - -   Trouble concentrating 0 2 - -   Moving slowly or fidgety/restless 0 2 - -   Suicidal thoughts 0 0 - -   PHQ-9 Score 0 17 - -   Difficult doing work/chores Not difficult at all Very difficult - -     Interpretation of Total Score  Total Score Depression Severity:  1-4 = Minimal depression, 5-9 = Mild depression, 10-14 = Moderate depression, 15-19 = Moderately severe depression, 20-27 = Severe depression   Psychosocial Evaluation and Intervention:   Psychosocial Re-Evaluation:     Psychosocial Re-Evaluation    [place] Name 11/05/16 1619 12/05/16 1206           Psychosocial Re-Evaluation   Current issues with Current Anxiety/Panic None Identified      Comments -  Krishika has a lot of anxiety and met with the hospital chaplain last week and found this to be helpful.  -      Interventions Stress management education;Encouraged to attend Cardiac Rehabilitation for the exercise Stress management education;Encouraged to attend Cardiac Rehabilitation for the exercise      Continue Psychosocial Services  Follow up required by staff Follow up required by staff  Ivin Booty feels a lot better and has less anxiety.          Psychosocial Discharge (Final Psychosocial Re-Evaluation):     Psychosocial Re-Evaluation - 12/05/16 1206      Psychosocial Re-Evaluation   Current issues with None Identified   Interventions Stress management education;Encouraged to attend Cardiac Rehabilitation for the exercise   Continue Psychosocial Services  Follow up required by staff  Ivin Booty feels a lot better and has less anxiety.      Vocational Rehabilitation: Provide vocational rehab assistance to qualifying candidates.   Vocational Rehab Evaluation & Intervention:     Vocational Rehab - 10/30/16 1700      Initial Vocational Rehab Evaluation & Intervention   Assessment shows need for Vocational Rehabilitation No     Vocational Rehab Re-Evaulation   Comments Nastassja works part time and has returned to her job without difficulty. Tacie does not vocational reahb at this time.      Education: Education Goals: Education classes will be provided on a weekly basis, covering required topics. Participant will state understanding/return demonstration of topics presented.  Learning Barriers/Preferences:     Learning Barriers/Preferences - 10/24/16 4825      Learning Barriers/Preferences   Learning Barriers Sight   Learning Preferences Skilled Demonstration      Education Topics: Count Your Pulse:  -Group instruction provided by verbal instruction, demonstration, patient participation and written materials to support subject.  Instructors address importance of being able to find your pulse and how to count your pulse when at home without a heart monitor.  Patients get hands on experience counting their pulse with staff help and individually.   Heart Attack,  Angina, and Risk Factor Modification:  -Group instruction provided by verbal instruction, video, and written materials to support subject.  Instructors address signs and symptoms of angina and heart attacks.    Also discuss risk factors for heart disease  and how to make changes to improve heart health risk factors.   CARDIAC REHAB PHASE II EXERCISE from 12/04/2016 in [place]  Date  11/13/16  Instruction Review Code  2- meets goals/outcomes      Functional Fitness:  -Group instruction provided by verbal instruction, demonstration, patient participation, and written materials to support subject.  Instructors address safety measures for doing things around the house.  Discuss how to get up and down off the floor, how to pick things up properly, how to safely get out of a chair without assistance, and balance training.   CARDIAC REHAB PHASE II EXERCISE from 12/04/2016 in [place]  Date  11/08/16  Educator  EP  Instruction Review Code  2- meets goals/outcomes      Meditation and Mindfulness:  -Group instruction provided by verbal instruction, patient participation, and written materials to support subject.  Instructor addresses importance of mindfulness and meditation practice to help reduce stress and improve awareness.  Instructor also leads participants through a meditation exercise.    CARDIAC REHAB PHASE II EXERCISE from 12/04/2016 in [place]  Date  12/04/16  Instruction Review Code  2- meets goals/outcomes      Stretching for Flexibility and Mobility:  -Group instruction provided by verbal instruction, patient participation, and written materials to support subject.  Instructors lead participants through series of stretches that are designed to increase flexibility thus improving mobility.  These stretches are additional exercise for major muscle groups that are typically performed during regular warm up and cool down.   Hands Only CPR:  -Group verbal, video, and participation provides a basic overview of AHA guidelines for community CPR. Role-play of emergencies allow participants the opportunity to practice calling for help and chest  compression technique with discussion of AED use.   Hypertension: -Group verbal and written instruction that provides a basic overview of hypertension including the most recent diagnostic guidelines, risk factor reduction with self-care instructions and medication management.   CARDIAC REHAB PHASE II EXERCISE from 12/04/2016 in [place]  Date  11/29/16  Instruction Review Code  2- meets goals/outcomes       Nutrition I class: Heart Healthy Eating:  -Group instruction provided by PowerPoint slides, verbal discussion, and written materials to support subject matter. The instructor gives an explanation and review of the Therapeutic Lifestyle Changes diet recommendations, which includes a discussion on lipid goals, dietary fat, sodium, fiber, plant stanol/sterol esters, sugar, and the components of a well-balanced, healthy diet.   CARDIAC REHAB PHASE II EXERCISE from 12/04/2016 in [place]  Date  10/24/16  Educator  RD  Instruction Review Code  Not applicable [class handouts given]      Nutrition II class: Lifestyle Skills:  -Group instruction provided by PowerPoint slides, verbal discussion, and written materials to support subject matter. The instructor gives an explanation and review of label reading, grocery shopping for heart health, heart healthy recipe modifications, and ways to make healthier choices when eating out.   CARDIAC REHAB PHASE II EXERCISE from 12/04/2016 in [place]  Date  10/24/16  Educator  RD  Instruction Review Code  Not  applicable [class handouts given]      Diabetes Question & Answer:  -Group instruction provided by PowerPoint slides, verbal discussion, and written materials to support subject matter. The instructor gives an explanation and review of diabetes co-morbidities, pre- and post-prandial blood glucose goals, pre-exercise blood glucose goals, signs,  symptoms, and treatment of hypoglycemia and hyperglycemia, and foot care basics.   Diabetes Blitz:  -Group instruction provided by PowerPoint slides, verbal discussion, and written materials to support subject matter. The instructor gives an explanation and review of the physiology behind type 1 and type 2 diabetes, diabetes medications and rational behind using different medications, pre- and post-prandial blood glucose recommendations and Hemoglobin A1c goals, diabetes diet, and exercise including blood glucose guidelines for exercising safely.    Portion Distortion:  -Group instruction provided by PowerPoint slides, verbal discussion, written materials, and food models to support subject matter. The instructor gives an explanation of serving size versus portion size, changes in portions sizes over the last 20 years, and what consists of a serving from each food group.   Stress Management:  -Group instruction provided by verbal instruction, video, and written materials to support subject matter.  Instructors review role of stress in heart disease and how to cope with stress positively.     Exercising on Your Own:  -Group instruction provided by verbal instruction, power point, and written materials to support subject.  Instructors discuss benefits of exercise, components of exercise, frequency and intensity of exercise, and end points for exercise.  Also discuss use of nitroglycerin and activating EMS.  Review options of places to exercise outside of rehab.  Review guidelines for sex with heart disease.   Cardiac Drugs I:  -Group instruction provided by verbal instruction and written materials to support subject.  Instructor reviews cardiac drug classes: antiplatelets, anticoagulants, beta blockers, and statins.  Instructor discusses reasons, side effects, and lifestyle considerations for each drug class.   CARDIAC REHAB PHASE II EXERCISE from 11/27/2016 in [place]  Date  11/27/16  Instruction Review Code  2- meets goals/outcomes      Cardiac Drugs II:  -Group instruction provided by verbal instruction and written materials to support subject.  Instructor reviews cardiac drug classes: angiotensin converting enzyme inhibitors (ACE-I), angiotensin II receptor blockers (ARBs), nitrates, and calcium channel blockers.  Instructor discusses reasons, side effects, and lifestyle considerations for each drug class.   CARDIAC REHAB PHASE II EXERCISE from 12/04/2016 in [place]  Date  10/30/16  Instruction Review Code  2- meets goals/outcomes      Anatomy and Physiology of the Circulatory System:  Group verbal and written instruction and models provide basic cardiac anatomy and physiology, with the coronary electrical and arterial systems. Review of: AMI, Angina, Valve disease, Heart Failure, Peripheral Artery Disease, Cardiac Arrhythmia, Pacemakers, and the ICD.   CARDIAC REHAB PHASE II EXERCISE from 12/04/2016 in [place]  Date  11/20/16  Instruction Review Code  2- meets goals/outcomes      Other Education:  -Group or individual verbal, written, or video instructions that support the educational goals of the cardiac rehab program.   Knowledge Questionnaire Score:     Knowledge Questionnaire Score - 10/24/16 1045      Knowledge Questionnaire Score   Pre Score 21/24      Core Components/Risk Factors/Patient Goals at Admission:   Core Components/Risk Factors/Patient Goals Review:    Core Components/Risk Factors/Patient Goals at Discharge (Final  Review):    ITP Comments:     ITP Comments    Row Name 10/24/16 [phone]           ITP Comments Dr. Fransico Him, Medical Director          Comments: Veria is making expected progress toward personal goals after completing 10 sessions. Recommend continued exercise and life style modification education including  stress  management and relaxation techniques to decrease cardiac risk profile. Loraine reports having less anxiety and has seen the hospital chaplain twice and has found this to be helpful.Barnet Pall, RN,BSN 12/05/2016 12:13 PM

## 2016-12-06 ENCOUNTER — Encounter (HOSPITAL_COMMUNITY)
Admission: RE | Admit: 2016-12-06 | Discharge: 2016-12-06 | Disposition: A | Discharge status: Home / Self Care | Payer: Medicare Other | Source: Ambulatory Visit

## 2016-12-06 DIAGNOSIS — I2121 ST elevation (STEMI) myocardial infarction involving left circumflex coronary artery: Secondary | ICD-10-CM

## 2016-12-06 DIAGNOSIS — Z955 Presence of coronary angioplasty implant and graft: Secondary | ICD-10-CM

## 2016-12-07 ENCOUNTER — Other Ambulatory Visit: Payer: Self-pay | Admitting: Physician Assistant

## 2016-12-07 ENCOUNTER — Other Ambulatory Visit: Payer: Self-pay | Admitting: Interventional Cardiology

## 2016-12-07 DIAGNOSIS — F418 Other specified anxiety disorders: Secondary | ICD-10-CM

## 2016-12-09 NOTE — Telephone Encounter (Signed)
Refill Request.  

## 2016-12-11 ENCOUNTER — Telehealth: Payer: Self-pay | Admitting: Physician Assistant

## 2016-12-11 ENCOUNTER — Encounter (HOSPITAL_COMMUNITY)
Admission: RE | Admit: 2016-12-11 | Discharge: 2016-12-11 | Disposition: A | Discharge status: Home / Self Care | Payer: Medicare Other | Source: Ambulatory Visit | Attending: Interventional Cardiology | Admitting: Interventional Cardiology

## 2016-12-11 VITALS — BP 104/64 | HR 82 | Wt 150.4 lb

## 2016-12-11 DIAGNOSIS — I2510 Atherosclerotic heart disease of native coronary artery without angina pectoris: Secondary | ICD-10-CM | POA: Diagnosis not present

## 2016-12-11 DIAGNOSIS — I2121 ST elevation (STEMI) myocardial infarction involving left circumflex coronary artery: Secondary | ICD-10-CM

## 2016-12-11 DIAGNOSIS — Z955 Presence of coronary angioplasty implant and graft: Secondary | ICD-10-CM | POA: Diagnosis not present

## 2016-12-11 DIAGNOSIS — I252 Old myocardial infarction: Secondary | ICD-10-CM | POA: Diagnosis not present

## 2016-12-11 DIAGNOSIS — F17210 Nicotine dependence, cigarettes, uncomplicated: Secondary | ICD-10-CM | POA: Diagnosis not present

## 2016-12-11 DIAGNOSIS — Z79899 Other long term (current) drug therapy: Secondary | ICD-10-CM | POA: Diagnosis not present

## 2016-12-11 DIAGNOSIS — F410 Panic disorder [episodic paroxysmal anxiety] without agoraphobia: Secondary | ICD-10-CM

## 2016-12-11 DIAGNOSIS — Z7982 Long term (current) use of aspirin: Secondary | ICD-10-CM | POA: Diagnosis not present

## 2016-12-11 MED ORDER — CLONAZEPAM 0.5 MG PO TABS: 0.2500 mg | ORAL_TABLET | Freq: Two times a day (BID) | ORAL | 0 refills | Status: DC | PRN

## 2016-12-11 NOTE — Telephone Encounter (Signed)
Please call pt and let her know I will refill this medication but I am only going to give an Rx for 20 tablets which should suffice her for the next month. As discussed in our initial visit this medication was not started to be used long term and therefore she needs to be using the lowest effective dose and should not be using daily. Once again, this medication does have addictive properties and we do not want to use it long term. Also, because it is a controlled substance, she will need to come to the office to pick up the printed Rx. Please have her also complete the controlled substance form, which I have also printed (it was probably sent to the back computer in the hallway, as I am off work campus doing work) and please attach it to the Rx, so she can complete that while she is in office picking up her Rx. Please place it in my box after she signs it so I can sign it and then scan it into her chart. Thanks!

## 2016-12-11 NOTE — Telephone Encounter (Signed)
Name confusion. See above.

## 2016-12-11 NOTE — Telephone Encounter (Signed)
Please advise 

## 2016-12-11 NOTE — Telephone Encounter (Signed)
Patient called in needing a refill for her clonazepam, [facility] uses the [facility] on [address]. [place] told her to call in when she needed more refills.  Her call back number is [phone]

## 2016-12-12 ENCOUNTER — Ambulatory Visit
Admission: RE | Admit: 2016-12-12 | Discharge: 2016-12-12 | Disposition: A | Discharge status: Home / Self Care | Payer: Medicare Other | Source: Ambulatory Visit | Attending: Physician Assistant | Admitting: Physician Assistant

## 2016-12-12 DIAGNOSIS — R109 Unspecified abdominal pain: Secondary | ICD-10-CM

## 2016-12-12 DIAGNOSIS — N852 Hypertrophy of uterus: Secondary | ICD-10-CM | POA: Diagnosis not present

## 2016-12-13 ENCOUNTER — Telehealth (HOSPITAL_COMMUNITY): Payer: Self-pay | Admitting: *Deleted

## 2016-12-13 ENCOUNTER — Telehealth: Payer: Self-pay | Admitting: *Deleted

## 2016-12-13 ENCOUNTER — Encounter (HOSPITAL_COMMUNITY): Admission: RE | Admit: 2016-12-13 | Payer: Medicare Other | Source: Ambulatory Visit

## 2016-12-13 ENCOUNTER — Encounter: Payer: Self-pay | Admitting: Obstetrics & Gynecology

## 2016-12-13 ENCOUNTER — Ambulatory Visit (INDEPENDENT_AMBULATORY_CARE_PROVIDER_SITE_OTHER): Payer: Medicare Other | Admitting: Obstetrics & Gynecology

## 2016-12-13 VITALS — BP 128/82 | Ht 65.0 in | Wt 149.0 lb

## 2016-12-13 DIAGNOSIS — Z124 Encounter for screening for malignant neoplasm of cervix: Secondary | ICD-10-CM

## 2016-12-13 DIAGNOSIS — N888 Other specified noninflammatory disorders of cervix uteri: Secondary | ICD-10-CM

## 2016-12-13 DIAGNOSIS — R938 Abnormal findings on diagnostic imaging of other specified body structures: Secondary | ICD-10-CM

## 2016-12-13 DIAGNOSIS — N882 Stricture and stenosis of cervix uteri: Secondary | ICD-10-CM

## 2016-12-13 DIAGNOSIS — Z1151 Encounter for screening for human papillomavirus (HPV): Secondary | ICD-10-CM | POA: Diagnosis not present

## 2016-12-13 DIAGNOSIS — R9389 Abnormal findings on diagnostic imaging of other specified body structures: Secondary | ICD-10-CM

## 2016-12-13 NOTE — Telephone Encounter (Signed)
Pt scheduled on 12/25/16 @ 9:45am with Dr.Rossi pt aware of time and date

## 2016-12-13 NOTE — Progress Notes (Signed)
Carol Ferguson 04-11-1951 [id number]   History:    66 y.o. G0  RP:  Referred for Cervical mass  HPI:  H/O MI of inferior wall in 09/2016. Stent put in place in Circumflex Coronary Artery.  On Brilinta, Atorvastatin, BB ASA.  Cigarette smoker until MI, quit at that time.  Decreased appetite and weight loss of 18 Lbs in last few months.  Complains of pelvic discomfort and pain x about 6 months.  Last pap test normal in 2012, never had an abnormal.  No PMB.  Mild yellowish vaginal d/c x 4 wks, no odor, no itching.  Abstinent. No UTI Sx.  Normal BMs.  No Fam H/O Uterine, Ovarian, Colon Ca or Breast Ca.  Under treatment for Depression and anxiety.  Past medical history,surgical history, family history and social history were all reviewed and documented in the EPIC chart.  Gynecologic History No LMP recorded. Patient is postmenopausal. Contraception: abstinence and post menopausal status Last Pap: 09/2010. Results were: Negative Last mammogram: 2012. Results were: Normal.  Recommended to schedule screening asap  Obstetric History OB History  Gravida Para Term Preterm AB Living  0 0 0 0 0 0  SAB TAB Ectopic Multiple Live Births  0 0 0 0 0         ROS: A ROS was performed and pertinent positives and negatives are included in the history.  GENERAL: No fevers or chills. HEENT: No change in vision, no earache, sore throat or sinus congestion. NECK: No pain or stiffness. CARDIOVASCULAR: No chest pain or pressure. No palpitations. PULMONARY: No shortness of breath, cough or wheeze. GASTROINTESTINAL: No abdominal pain, nausea, vomiting or diarrhea, melena or bright red blood per rectum. GENITOURINARY: No urinary frequency, urgency, hesitancy or dysuria. MUSCULOSKELETAL: No joint or muscle pain, no back pain, no recent trauma. DERMATOLOGIC: No rash, no itching, no lesions. ENDOCRINE: No polyuria, polydipsia, no heat or cold intolerance. No recent change in weight. HEMATOLOGICAL: No anemia or easy  bruising or bleeding. NEUROLOGIC: No headache, seizures, numbness, tingling or weakness. PSYCHIATRIC: No depression, no loss of interest in normal activity or change in sleep pattern.     Exam:   BP 128/82   Ht 5\' 5"  (1.651 m)   Wt 149 lb (67.6 kg)   BMI 24.79 kg/m   Body mass index is 24.79 kg/m.  General appearance : Well developed well nourished female. No acute distress  Abdomen: no palpable masses or tenderness, no rebound or guarding No inguinal LN palpable.  Extremities: no edema or skin discoloration or tenderness  Pelvic: Vulva normal  Bartholin, Urethra, Skene Glands: Within normal limits             Vagina: No gross lesions.  Yellowish discharge               Cervix: Enlarged/hard/irregular with bluish/reddish color.  Cervix stenosed.  Gritty, irregular feeling during dilation with small dilator set.  Pap test/HPV done with brush/spatula.  Not able to dilate for EBx to be performed.  Pain ++.               Uterus AV mobile, normal volume.  Tender to mobilisation  Adnexa  Without masses.  Tenderness bilaterally.  Anus and perineum  normal   Pelvic US 12/12/2016:  Uterus:  Measurements: 7.2 x 3.5 x 4.1 cm. There is a small mass in the lower uterine segment measuring 11 x 12 x 12 mm, likely a small fibroid.  There also appears to be a  mass centered in the cervix, extending into the lower uterine body measuring 2.2 x 1.9 x 2.0 cm.  Endometrium:  Thickness: 12 mm.  No focal abnormality visualized.  Right ovary:  Measurements: 2.2 x 1.4 x 1.6 cm. Normal appearance/no adnexal mass.  Left ovary:  Measurements: 2.2 x 1.6 x 1.7 cm. Normal appearance/no adnexal mass.  No abnormal free fluid. IMPRESSION: 1. There appears to be a 2.2 x 1.9 x 2.0 cm mass centered in the cervix, likely extending into the lower uterus. Recommend correlation with physical exam. An MRI could better evaluate. 2. Thickened endometrium measuring 12 mm. Recommend gynecologic consultation and endometrial  sampling given patient age. 3. Probable fibroid in the uterus.  Assessment/Plan:  66 y.o. female G0  1. Mass of uterine cervix Very hard, irregular and increased size cervix on bimanual exam.  No mass visible at Exocervix.  Per [place], 2.2 cm central cervical mass with extension towards the LUS.  Endocervix stenosed/irregular/gritty.  Pap/HPV done after dilation of cervix.  High suspicion for Cervical Cancer.  Referred to Gyneco-Oncology to complete investigation and management.  2. Thickened endometrium Endometrial line 12 mm.  Menopause x many years, no HRT.  EBx not possible given Cervical stenosis and pain.  Might need Gyn exam/EBx/Cervical Bx under [facility].  3. Cervical stenosis (uterine cervix) As described above.  4. Screening for malignant neoplasm of cervix  - PAP,TP IMGw/HPV RNA,rflx HPVTYPE16,18/45  5. Special screening examination for human papillomavirus (HPV)  - PAP,TP IMGw/HPV RNA,rflx [id number],88/32  Counseling on above issues >50% x 45 minutes  Princess Bruins MD, 9:01 AM 12/13/2016

## 2016-12-13 NOTE — Telephone Encounter (Signed)
-----   Message from Carol Bruins, MD sent at 12/13/2016  9:51 AM EDT ----- Regarding: Urgent referral to Gyneco-Onco 66 yo No HRT.  Pelvic pain radiating to legs, decreased appetite with 20 Lbs weight loss x a few months.  MI 09/2016/Stent in Circomflex Coronary artery.  Central cervical mass extending towards LUS of Uterus 2.2 cm and Thickened Endometrial line 12 mm on [place] 12/12/16.  On exam:  Hard irregular increased size cervix with gritty stenosed endocervical canal.  Able to do a good Pap test after dilation.  EBx not possible.  High suspicion of Cervical Cancer/Endometrial Cancer?  Refer urgently to Gyneco-Onco.

## 2016-12-15 NOTE — Patient Instructions (Signed)
1. Mass of uterine cervix Very hard, irregular and increased size cervix on bimanual exam.  No mass visible at Exocervix.  Per [place], 2.2 cm central cervical mass with extension towards the LUS.  Endocervix stenosed/irregular/gritty.  Pap/HPV done after dilation of cervix.  High suspicion for Cervical Cancer.  Referred to Gyneco-Oncology to complete investigation and management.  2. Thickened endometrium Endometrial line 12 mm.  Menopause x many years, no HRT.  EBx not possible given Cervical stenosis and pain.  Might need Gyn exam/EBx/Cervical Bx under [facility].  3. Cervical stenosis (uterine cervix) As described above.  4. Screening for malignant neoplasm of cervix  - PAP,TP IMGw/HPV RNA,rflx HPVTYPE16,18/45  5. Special screening examination for human papillomavirus (HPV)  - PAP,TP IMGw/HPV RNA,rflx [id number],85/02  Layne, it was a pleasure to meet you today!  I sent the referral request for Gyneco-Oncology.  You will receive a phone call promptly to schedule that appointment.  Please call me if you have any questions or would like to discuss any problem.

## 2016-12-16 LAB — PAP, TP IMAGING W/ HPV RNA, RFLX HPV TYPE 16,18/45: HPV MRNA, HIGH RISK: NOT DETECTED

## 2016-12-16 NOTE — Telephone Encounter (Signed)
Spoke with pt this Morning about Rx and she informed me she has picked up Rx and signed the contract for this Medication.

## 2016-12-18 ENCOUNTER — Encounter (HOSPITAL_COMMUNITY): Payer: Medicare Other

## 2016-12-18 ENCOUNTER — Telehealth: Payer: Self-pay | Admitting: *Deleted

## 2016-12-18 NOTE — Telephone Encounter (Signed)
Pt was informed negative pap and negative HPV has appointment scheduled on next week with GYN oncology. Pt said she thought if Pap/HPV negative no need to appointment with [facility] oncology. I did explained to patient about the cervical mass. Pt wants confirmation from you that she still needs this appointment. Please advise

## 2016-12-19 NOTE — Telephone Encounter (Signed)
Pt informed new appointment with Dr.Rossi on 12/27/16 @ 10:15am per Lenna Sciara Dr.Rossi has to reschedule appointments for this time and date.

## 2016-12-19 NOTE — Telephone Encounter (Signed)
Yes, absolutely needs evaluation with Gyn-Onco for the Cervical mass and also the Thickened Endometrium that I was not able to assess with an EBx.

## 2016-12-20 ENCOUNTER — Encounter (HOSPITAL_COMMUNITY): Payer: Medicare Other

## 2016-12-25 ENCOUNTER — Encounter (HOSPITAL_COMMUNITY): Payer: Medicare Other

## 2016-12-25 ENCOUNTER — Ambulatory Visit: Payer: Medicare Other | Admitting: Gynecologic Oncology

## 2016-12-26 ENCOUNTER — Ambulatory Visit: Payer: Medicare Other | Admitting: Obstetrics & Gynecology

## 2016-12-27 ENCOUNTER — Ambulatory Visit: Payer: Medicare Other | Attending: Gynecologic Oncology | Admitting: Gynecologic Oncology

## 2016-12-27 ENCOUNTER — Encounter (HOSPITAL_COMMUNITY): Payer: Medicare Other

## 2016-12-27 ENCOUNTER — Encounter: Payer: Self-pay | Admitting: Gynecologic Oncology

## 2016-12-27 VITALS — BP 108/44 | HR 66 | Temp 98.5°F | Resp 18 | Ht 65.0 in | Wt 146.0 lb

## 2016-12-27 DIAGNOSIS — Z955 Presence of coronary angioplasty implant and graft: Secondary | ICD-10-CM | POA: Insufficient documentation

## 2016-12-27 DIAGNOSIS — I252 Old myocardial infarction: Secondary | ICD-10-CM | POA: Diagnosis not present

## 2016-12-27 DIAGNOSIS — N888 Other specified noninflammatory disorders of cervix uteri: Secondary | ICD-10-CM | POA: Diagnosis not present

## 2016-12-27 DIAGNOSIS — Z87891 Personal history of nicotine dependence: Secondary | ICD-10-CM | POA: Diagnosis not present

## 2016-12-27 DIAGNOSIS — F329 Major depressive disorder, single episode, unspecified: Secondary | ICD-10-CM | POA: Insufficient documentation

## 2016-12-27 DIAGNOSIS — Z9049 Acquired absence of other specified parts of digestive tract: Secondary | ICD-10-CM | POA: Insufficient documentation

## 2016-12-27 DIAGNOSIS — I2510 Atherosclerotic heart disease of native coronary artery without angina pectoris: Secondary | ICD-10-CM | POA: Diagnosis not present

## 2016-12-27 DIAGNOSIS — I255 Ischemic cardiomyopathy: Secondary | ICD-10-CM | POA: Diagnosis not present

## 2016-12-27 DIAGNOSIS — Z79899 Other long term (current) drug therapy: Secondary | ICD-10-CM | POA: Insufficient documentation

## 2016-12-27 DIAGNOSIS — Z7982 Long term (current) use of aspirin: Secondary | ICD-10-CM | POA: Insufficient documentation

## 2016-12-27 DIAGNOSIS — F419 Anxiety disorder, unspecified: Secondary | ICD-10-CM | POA: Insufficient documentation

## 2016-12-27 DIAGNOSIS — N882 Stricture and stenosis of cervix uteri: Secondary | ICD-10-CM

## 2016-12-27 DIAGNOSIS — N889 Noninflammatory disorder of cervix uteri, unspecified: Secondary | ICD-10-CM | POA: Diagnosis not present

## 2016-12-27 DIAGNOSIS — N949 Unspecified condition associated with female genital organs and menstrual cycle: Secondary | ICD-10-CM

## 2016-12-27 MED ORDER — LORAZEPAM 0.5 MG PO TABS: 0.5000 mg | ORAL_TABLET | Freq: Once | ORAL | 0 refills | Status: AC

## 2016-12-27 NOTE — Progress Notes (Signed)
Consult Note: Gyn-Onc  Consult was requested by Dr. Dellis Filbert for the evaluation of Carol Ferguson 66 y.o. female  CC:  Chief Complaint  Patient presents with  . Cervical mass    Assessment/Plan:  Carol Ferguson  is a 66 y.o.  year old with a 2cm cervical mass, in the setting of a normal pap and HPV negative result. However, her cervix is hard and abnormal in consistency (though normal in appearance). She cannot tolerate an office exam due to cervical stenosis and discomfort. She is recovering from a circumflex artery MI in June, 2018 (Dr Daneen Schick is her cardiologist) with a coronary stent placed. She is on anti-platelet therapy.  I am recommending better evaluation with MRI of the pelvis and, unless there is definitive reassurance from imaging, exam under anesthesia with endocervical sampling, and D&C under ultrasound guidance. She can remain on her anti-platelet therapy for this procedure, though, I explained it will increase her risk for bleeding somewhat.  We will discuss with Dr Tamala Julian the safety of such a procedure.   HPI: Carol Ferguson is a very pleasant 66 year old P0 who is seen in consultation at the request of Dr Dellis Filbert for a cervical mass.  The patient has a long history of smoking. Her last pap was in 2012 and was normal.  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 This was treated with a stent in the circumflex artery ([employer], Resolute onyx zotarolimus-eluting stent).  She has excellent cardiac function postprocedure and has resumed normal activities, though had quit smoking June, 2018.  The patient began developing right lower quadrant discomfort in July, 2018 with intermittent sharp shooting pains. She reported this to Carol Ferguson, her PCP who performed a TVUS which showed a uterus measuring 7.2x3.5x4.1cm with a small mass in the lower uterine segment measuring 57mm (likely small fibroid). There appears to be a mass centered in the cervix extending  into the lower uterine body measuring 2.2x2x1.9cm. The endometrium measures 44mm. The ovaries were normal.  She denies bleeding.   Biopsy was attempted on 12/18/16 which was unsuccessful due to patient discomfort. Pap was normal with no HR HPV detected.   Current Meds:  Outpatient Encounter Prescriptions as of 12/27/2016  Medication Sig  . acetaminophen (TYLENOL) 500 MG tablet Take 500 mg by mouth every 6 (six) hours as needed.  Marland Kitchen aspirin 81 MG chewable tablet Chew 1 tablet (81 mg total) by mouth daily.  Marland Kitchen atorvastatin (LIPITOR) 10 MG tablet Take 1 tablet (10 mg total) by mouth daily.  Marland Kitchen BRILINTA 90 MG TABS tablet TAKE 1 TABLET BY MOUTH TWICE A DAY  . clonazePAM (KLONOPIN) 0.5 MG tablet Take 0.5 tablets (0.25 mg total) by mouth 2 (two) times daily as needed for anxiety.  Marland Kitchen FLUoxetine (PROZAC) 10 MG capsule Take 1 capsule (10 mg total) by mouth daily.  . nitroGLYCERIN (NITROSTAT) 0.4 MG SL tablet Place 1 tablet (0.4 mg total) under the tongue every 5 (five) minutes x 3 doses as needed for chest pain.  . [DISCONTINUED] BRILINTA 90 MG TABS tablet TAKE 1 TABLET BY MOUTH TWICE A DAY   No facility-administered encounter medications on file as of 12/27/2016.     Allergy:  Allergies  Allergen Reactions  . Flagyl [Metronidazole] Nausea Only    DIARRHEA    Social Hx:   Social History   Social History  . Marital status: Divorced    Spouse name: N/A  . Number of children: N/A  . Years  of education: N/A   Occupational History  . Not on file.   Social History Main Topics  . Smoking status: Former Smoker    Packs/day: 1.00    Years: 35.00    Types: Cigarettes    Quit date: 09/26/2016  . Smokeless tobacco: Never Used  . Alcohol use Yes     Comment: occasional  . Drug use: No  . Sexual activity: No   Other Topics Concern  . Not on file   Social History Narrative  . No narrative on file    Past Surgical Hx:  Past Surgical History:  Procedure Laterality Date  . CHOLECYSTECTOMY     . CORONARY STENT INTERVENTION N/A 09/26/2016   Procedure: Coronary Stent Intervention;  Surgeon: Belva Crome, MD;  Location: [facility];  Service: Cardiovascular;  Laterality: N/A;  . LEFT HEART CATH AND CORONARY ANGIOGRAPHY N/A 09/26/2016   Procedure: Left Heart Cath and Coronary Angiography;  Surgeon: Belva Crome, MD;  Location: [place];  Service: Cardiovascular;  Laterality: N/A;    Past Medical Hx:  Past Medical History:  Diagnosis Date  . Anxiety   . CAD (coronary artery disease)    a. Inf STEMI/LHC 09/2016 which showed occluded mid LCx treated with PCI, DES; remaining cath details included a proximal to mid LAD lesion of 20% stenosis, and proximal to mid RCA 25% stenosis. EF 55-60%.  . Depression   . Ischemic cardiomyopathy    a. 2D Echo 09/28/16 showed EF 55-60%, probable severe hypokinesis of the entire inferiormyocardium, normal diastolic parameters.  . Myocardial infarction ([place]) 09/26/2016  . Tobacco abuse     Past Gynecological History:  Nulliparous, menopause age 66 No LMP recorded. Patient is postmenopausal.  Family Hx:  Family History  Problem Relation Age of Onset  . Heart attack Brother   . Cancer Brother        MELANOMA  . Heart disease Father   . Stroke Father   . Heart disease Brother   . Diabetes Brother     Review of Systems:  Constitutional  Feels well,    ENT Normal appearing ears and nares bilaterally Skin/Breast  No rash, sores, jaundice, itching, dryness Cardiovascular  No chest pain, shortness of breath, or edema  Pulmonary  No cough or wheeze.  Gastro Intestinal  No nausea, vomitting, or diarrhoea. No bright red blood per rectum, no abdominal pain, change in bowel movement, or constipation.  Genito Urinary  No frequency, urgency, dysuria, no bleeding Musculo Skeletal  No myalgia, arthralgia, joint swelling or pain  Neurologic  No weakness, numbness, change in gait,  Psychology  No depression, anxiety, insomnia.    Vitals:  Blood pressure (!) 108/44, pulse 66, temperature 98.5 F (36.9 C), temperature source Oral, resp. rate 18, height 5\' 5"  (1.651 m), weight 66.2 kg (146 lb), SpO2 98 %.  Physical Exam: WD in NAD Neck  Supple NROM, without any enlargements.  Lymph Node Survey No cervical supraclavicular or inguinal adenopathy Cardiovascular  Pulse normal rate, regularity and rhythm. S1 and S2 normal.  Lungs  Clear to auscultation bilateraly, without wheezes/crackles/rhonchi. Good air movement.  Skin  No rash/lesions/breakdown  Psychiatry  Alert and oriented to person, place, and time  Abdomen  Normoactive bowel sounds, abdomen soft, non-tender and thin without evidence of hernia.  Ferguson No CVA tenderness Genito Urinary  Vulva/vagina: Normal external female genitalia.  No lesions. No discharge or bleeding.  Bladder/urethra:  No lesions or masses, well supported bladder  Vagina: grossly normal  Cervix: Normal appearing, however very firm to palpate. Overlying mucosa erythematous but without gross lesions. Pinpoint os. Unable to dilate- very rigid. ECC obtained. Minimal bleeding encountered.  Uterus:  Small, mobile, no parametrial involvement or nodularity.  Adnexa: no palpable masses. Rectal  Good tone, no masses no cul de sac nodularity. No parametrial disease. Extremities  No bilateral cyanosis, clubbing or edema.   Donaciano Eva, MD  12/27/2016, 11:28 AM

## 2016-12-27 NOTE — Patient Instructions (Signed)
We will call you with the results of your biopsy from today.  Plan on having a MRI and we will also notify you of those results.  Plan to have an Examination under Anesthesia with Biopsies at the [facility] on January 28, 2017.  You will receive a phone call from the pre-surgical RN to discuss instructions.  Please call for any questions or concerns.

## 2016-12-30 ENCOUNTER — Other Ambulatory Visit: Payer: Self-pay | Admitting: Gynecologic Oncology

## 2016-12-30 DIAGNOSIS — N888 Other specified noninflammatory disorders of cervix uteri: Secondary | ICD-10-CM

## 2016-12-31 ENCOUNTER — Telehealth: Payer: Self-pay | Admitting: Gynecologic Oncology

## 2016-12-31 NOTE — Telephone Encounter (Signed)
Patient informed of biopsy results. No concerns voiced. Advised to call for any needs or concerns. 

## 2017-01-01 ENCOUNTER — Encounter (HOSPITAL_COMMUNITY): Payer: Medicare Other

## 2017-01-01 ENCOUNTER — Ambulatory Visit (HOSPITAL_COMMUNITY)
Admission: RE | Admit: 2017-01-01 | Discharge: 2017-01-01 | Disposition: A | Discharge status: Home / Self Care | Payer: Medicare Other | Source: Ambulatory Visit | Attending: Gynecologic Oncology | Admitting: Gynecologic Oncology

## 2017-01-01 DIAGNOSIS — N888 Other specified noninflammatory disorders of cervix uteri: Secondary | ICD-10-CM | POA: Diagnosis not present

## 2017-01-01 DIAGNOSIS — K6389 Other specified diseases of intestine: Secondary | ICD-10-CM | POA: Diagnosis not present

## 2017-01-01 DIAGNOSIS — N949 Unspecified condition associated with female genital organs and menstrual cycle: Secondary | ICD-10-CM | POA: Diagnosis present

## 2017-01-01 LAB — POCT I-STAT CREATININE: Creatinine, Ser: 0.8 mg/dL (ref 0.44–1.00)

## 2017-01-01 MED ORDER — GADOBENATE DIMEGLUMINE 529 MG/ML IV SOLN
15.0000 mL | Freq: Once | INTRAVENOUS | Status: AC | PRN
  Administered 2017-01-01: 14 mL via INTRAVENOUS

## 2017-01-02 ENCOUNTER — Telehealth (HOSPITAL_COMMUNITY): Payer: Self-pay | Admitting: *Deleted

## 2017-01-02 ENCOUNTER — Encounter (HOSPITAL_COMMUNITY): Payer: Self-pay | Admitting: *Deleted

## 2017-01-02 DIAGNOSIS — I2121 ST elevation (STEMI) myocardial infarction involving left circumflex coronary artery: Secondary | ICD-10-CM

## 2017-01-02 DIAGNOSIS — Z955 Presence of coronary angioplasty implant and graft: Secondary | ICD-10-CM

## 2017-01-02 NOTE — Progress Notes (Signed)
Discharge Progress Report  Patient Details  Name: Carol Ferguson MRN: [id number] Date of Birth: 07-Jan-1951 Referring Provider:     CARDIAC REHAB PHASE II ORIENTATION from 10/24/2016 in [facility]  Referring Provider  Daneen Schick MD       Number of Visits: 11  Reason for Discharge:  Early Exit:  Personal  Smoking History:  History  Smoking Status  . Former Smoker  . Packs/day: 1.00  . Years: 35.00  . Types: Cigarettes  . Quit date: 09/26/2016  Smokeless Tobacco  . Never Used    Diagnosis:  ST elevation myocardial infarction involving left circumflex coronary artery (HCC)  Status post coronary artery stent placement  ADL UCSD:   Initial Exercise Prescription:   Discharge Exercise Prescription (Final Exercise Prescription Changes):     Exercise Prescription Changes - 12/11/16 1511      Response to Exercise   Blood Pressure (Admit) 104/64   Blood Pressure (Exercise) 110/60   Blood Pressure (Exit) 95/62   Heart Rate (Admit) 82 bpm   Heart Rate (Exercise) 128 bpm   Heart Rate (Exit) 77 bpm   Rating of Perceived Exertion (Exercise) 10   Duration Progress to 45 minutes of aerobic exercise without signs/symptoms of physical distress   Intensity THRR unchanged     Progression   Progression Continue to progress workloads to maintain intensity without signs/symptoms of physical distress.   Average METs 3.1     Resistance Training   Training Prescription Yes   Weight 3   Reps 10-15   Time 10 Minutes     Interval Training   Interval Training No     NuStep   Level 4   METs 2.8     Track   Laps 27   Minutes 20   METs 3.35     Home Exercise Plan   Plans to continue exercise at Home (comment)   Initial Home Exercises Provided 11/29/16      Functional Capacity:   Psychological, QOL, Others - Outcomes: PHQ 2/9: Depression screen [facility] 2/9 01/10/2017 12/03/2016 11/06/2016 10/30/2016 10/30/2016  Decreased Interest 2 0 0 0 0   Down, Depressed, Hopeless 0 0 2 1 0  PHQ - 2 Score 2 0 2 1 0  Altered sleeping 0 0 3 - -  Tired, decreased energy 0 0 3 - -  Change in appetite 3 0 3 - -  Feeling bad or failure about yourself  - 0 2 - -  Trouble concentrating 0 0 2 - -  Moving slowly or fidgety/restless 0 0 2 - -  Suicidal thoughts 0 0 0 - -  PHQ-9 Score 5 0 17 - -  Difficult doing work/chores Not difficult at all Not difficult at all Very difficult - -    Quality of Life:   Personal Goals: Goals established at orientation with interventions provided to work toward goal.    Personal Goals Discharge:   Exercise Goals and Review:   Nutrition & Weight - Outcomes:      Post Biometrics - 12/11/16 1511       Post  Biometrics   Weight 150 lb 5.7 oz (68.2 kg)   BMI (Calculated) 25.02      Nutrition:   Nutrition Discharge:   Education Questionnaire Score:   Carol Ferguson attended 11 exercise sessions and did well with exercise. Carol Ferguson recently found out she has a cervical mass and will not be able to participate in phase 2 cardiac rehab at  this time due to her upcoming appointments and treatment.Barnet Pall, RN,BSN 01/13/2017 5:12 PM

## 2017-01-03 ENCOUNTER — Telehealth: Payer: Self-pay | Admitting: Gynecologic Oncology

## 2017-01-03 ENCOUNTER — Encounter (HOSPITAL_COMMUNITY): Payer: Medicare Other

## 2017-01-03 NOTE — Telephone Encounter (Signed)
Discussed MRI results with patient.  All questions answered.  Advised to call for any needs.

## 2017-01-06 ENCOUNTER — Telehealth: Payer: Self-pay | Admitting: Physician Assistant

## 2017-01-06 NOTE — Telephone Encounter (Signed)
Carol Ferguson - Pt wants to know if you can do a refill on her clonazepam.  She says they found a mass and she has a biopsy coming up.  She has a follow up with you in November. [phone]

## 2017-01-08 ENCOUNTER — Encounter (HOSPITAL_COMMUNITY): Payer: Medicare Other

## 2017-01-08 NOTE — Addendum Note (Signed)
Encounter addended by: Sol Passer on: 01/08/2017  5:28 PM<BR>    Actions taken: Flowsheet accepted, Flowsheet data copied forward, Visit Navigator Flowsheet section accepted, Vitals modified

## 2017-01-09 NOTE — Telephone Encounter (Signed)
Please call patient and let her know that she will need to come in for an appointment to discuss this medication refill. Thank you.

## 2017-01-09 NOTE — Addendum Note (Signed)
Encounter addended by: Jewel Baize, RD on: 01/09/2017  1:38 PM<BR>    Actions taken: Flowsheet data copied forward, Visit Navigator Flowsheet section accepted

## 2017-01-10 ENCOUNTER — Encounter: Payer: Self-pay | Admitting: Physician Assistant

## 2017-01-10 ENCOUNTER — Encounter (HOSPITAL_COMMUNITY): Payer: Medicare Other

## 2017-01-10 ENCOUNTER — Ambulatory Visit (INDEPENDENT_AMBULATORY_CARE_PROVIDER_SITE_OTHER): Payer: Medicare Other | Admitting: Physician Assistant

## 2017-01-10 VITALS — BP 114/79 | HR 68 | Temp 97.7°F | Resp 18 | Ht 65.0 in | Wt 144.6 lb

## 2017-01-10 DIAGNOSIS — G479 Sleep disorder, unspecified: Secondary | ICD-10-CM | POA: Diagnosis not present

## 2017-01-10 DIAGNOSIS — I255 Ischemic cardiomyopathy: Secondary | ICD-10-CM | POA: Diagnosis not present

## 2017-01-10 DIAGNOSIS — K5790 Diverticulosis of intestine, part unspecified, without perforation or abscess without bleeding: Secondary | ICD-10-CM | POA: Insufficient documentation

## 2017-01-10 DIAGNOSIS — F419 Anxiety disorder, unspecified: Secondary | ICD-10-CM

## 2017-01-10 DIAGNOSIS — F329 Major depressive disorder, single episode, unspecified: Secondary | ICD-10-CM | POA: Diagnosis not present

## 2017-01-10 DIAGNOSIS — F410 Panic disorder [episodic paroxysmal anxiety] without agoraphobia: Secondary | ICD-10-CM | POA: Diagnosis not present

## 2017-01-10 MED ORDER — CLONAZEPAM 0.5 MG PO TABS: 0.2500 mg | ORAL_TABLET | Freq: Two times a day (BID) | ORAL | 0 refills | Status: DC | PRN

## 2017-01-10 MED ORDER — TRAZODONE HCL 50 MG PO TABS: 25.0000 mg | ORAL_TABLET | Freq: Every evening | ORAL | 1 refills | Status: DC | PRN

## 2017-01-10 NOTE — Patient Instructions (Addendum)
For anxiety, I want you to continue prozac dose at 10mg  daily. For help with sleep, start Trazodone - start with 1/2 pill at night for the 1st 4 nights - may increase to one full tablet after 4 days if no improvement in sleep with half tablet.   Use klonopin only in times of true panic attack. Try not to use daily. Follow up in 6-8 weeks for reevaluation.   Thank you for letting me participate in your health and well being.     IF you received an x-ray today, you will receive an invoice from [facility]. Please contact [facility] at [phone] with questions or concerns regarding your invoice.   IF you received labwork today, you will receive an invoice from [facility]. Please contact [facility] at [phone] with questions or concerns regarding your invoice.   Our billing staff will not be able to assist you with questions regarding bills from these companies.  You will be contacted with the lab results as soon as they are available. The fastest way to get your results is to activate your My Chart account. Instructions are located on the last page of this paperwork. If you have not heard from [place] regarding the results in 2 weeks, please contact this office.

## 2017-01-10 NOTE — Telephone Encounter (Signed)
PT SET AN OV TO SEE [facility]

## 2017-01-10 NOTE — Progress Notes (Signed)
Carol Ferguson  MRN: [id number] DOB: October 09, 1950  Subjective:  Carol Ferguson is a 66 y.o. female seen in office today for a chief complaint of Follow-up on anxiety and depression. Initially evaluated by me in 10/2016 for worsening anxiety and depression since MI in 09/2016. She was having panic attacks daily at this time. Had tried wellbutrin which made them significantly worse. She stopped medication, came to see me. Had tried prozac in the past and had done very well with it. She was restarted on prozac 10mg  dialy and given Rx for klonopin to use during bouts of panic attacks. She didn't follow up on 12/03/2016 and was doing much better. Her panic attacks have decreased in frequency. Was using Klonopin as needed for those attacks. During her last visit she had also mentioned that she was having abdominal discomfort for the past few months. On physical exam her cervix had a mild bluish discoloration and was firm to palpation. She was sent to have transvaginal and pelvis ultrasound, which showed a 2 x 0.2 x 1.9 x 2.0 cm mass in the cervix likely extending into the lower uterus and a thickened endometrium. She was sent for further evaluation to gynecology. Per patient, she has had multiple visits with gynecology. They have attempted to get a biopsy of the mass without success. She has a follow-up with them next week. Patient states that the gynecologist doesn't seem very concerned for cancer but that is obviously still in the differential. Since last visit, patient has been using Klonopin daily. . Notes she wakes up in the middle the night around 3 AM and is worrying about what this mass could be, which causes her to stay awake. She then takes a Klonopin to help sleep. She has been out of Klonopin for the past week. She is still taking Prozac daily, which she notes has helped out extremely. States that she initially saw me she felt lik she was on the ceiling and now she feels like she is much more grounded.  She does not want to be taking an addictive medication daily and is concerned about her Klonopin use. She is wondering if there is anything she could take to help her with sleep. She has not followed up with a therapy session with her prior counselor, Mikki Santee, but thinks that she would benefit from this and is willing to reach out to him this week. Denies nausea, vomiting, chest pain, heart palpitations, dizziness, suicidal, and homicidal thoughts.   Review of Systems  Per HPI  Patient Active Problem List   Diagnosis Date Noted  . Cervical mass 12/27/2016  . CAD (coronary artery disease), native coronary artery 11/27/2016  . Chronic diastolic heart failure ([place]) 09/27/2016  . Tobacco abuse 09/27/2016  . Hyperlipidemia with target LDL less than 70 09/27/2016  . 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 Lumbar degenerative disc disease 06/14/2016    Current Outpatient Prescriptions on File Prior to Visit  Medication Sig Dispense Refill  . acetaminophen (TYLENOL) 500 MG tablet Take 500 mg by mouth every 6 (six) hours as needed.    Marland Kitchen aspirin 81 MG chewable tablet Chew 1 tablet (81 mg total) by mouth daily. 30 tablet 11  . atorvastatin (LIPITOR) 10 MG tablet Take 1 tablet (10 mg total) by mouth daily. 90 tablet 3  . BRILINTA 90 MG TABS tablet TAKE 1 TABLET BY MOUTH TWICE A DAY 60 tablet 10  . clonazePAM (KLONOPIN) 0.5 MG tablet Take 0.5 tablets (  0.25 mg total) by mouth 2 (two) times daily as needed for anxiety. 20 tablet 0  . FLUoxetine (PROZAC) 10 MG capsule Take 1 capsule (10 mg total) by mouth daily. 90 capsule 0  . nitroGLYCERIN (NITROSTAT) 0.4 MG SL tablet Place 1 tablet (0.4 mg total) under the tongue every 5 (five) minutes x 3 doses as needed for chest pain. 25 tablet 11   No current facility-administered medications on file prior to visit.     Allergies  Allergen Reactions  . Flagyl [Metronidazole] Nausea Only    DIARRHEA     Objective:  BP 114/79 (BP Location:  Right Arm, Patient Position: Sitting, Cuff Size: Normal)   Pulse 68   Temp 97.7 F (36.5 C) (Oral)   Resp 18   Ht 5\' 5"  (1.651 m)   Wt 144 lb 9.6 oz (65.6 kg)   BMI 24.06 kg/m   Physical Exam  Constitutional: She is oriented to person, place, and time and well-developed, well-nourished, and in no distress.  HENT:  Head: Normocephalic and atraumatic.  Eyes: Conjunctivae are normal.  Neck: Normal range of motion.  Pulmonary/Chest: Effort normal.  Neurological: She is alert and oriented to person, place, and time. Gait normal.  Skin: Skin is warm and dry.  Psychiatric: Affect normal.  Vitals reviewed.    Assessment and Plan :  1. Anxiety and depression Anxiety appears to be more controlled than initial 2 visits. Patient is obviously going through a stressful time at this moment. Continue Prozac 10 mg daily. Patient does not need refills at this time. I too am concerned about patient's daily Klonopin use for the past month. She has been out of Klonopin for the past week and has done fine without it. We discussed the potential addictive properties of daily benzodiazepine use. Educated patient on the difference of a panic attack and a feeling of not being able to sleep. Discussed that her panic attacks, Klonopin is indicated. However, it should not be used daily to help with sleep. Will attempt a trial of trazodone for sleep at this time. Patient educated on dosing of trazodone. Do not exceed 50 mg per night. Encouraged to reach out to her therapist and schedule an appointment for counseling. Encouraged to follow-up with me in 6-8 weeks for reevaluation. Return sooner if symptoms worsen.  2. Panic attacks - clonazePAM (KLONOPIN) 0.5 MG tablet; Take 0.5 tablets (0.25 mg total) by mouth 2 (two) times daily as needed for anxiety.  Dispense: 15 tablet; Refill: 0  3. Sleep disturbance - traZODone (DESYREL) 50 MG tablet; Take 0.5-1 tablets (25-50 mg total) by mouth at bedtime as needed for sleep.   Dispense: 30 tablet; Refill: 1  A total of 25 minutes was spent in the room with the patient, greater than 50% of which was in counseling/coordination of care regarding anxiety and sleep disturbance.  Tenna Delaine PA-C  [facility] 01/10/2017 12:00 PM

## 2017-01-15 ENCOUNTER — Encounter (HOSPITAL_COMMUNITY): Payer: Medicare Other

## 2017-01-17 ENCOUNTER — Encounter (HOSPITAL_COMMUNITY): Payer: Medicare Other

## 2017-01-22 ENCOUNTER — Telehealth: Payer: Self-pay | Admitting: *Deleted

## 2017-01-22 ENCOUNTER — Encounter (HOSPITAL_COMMUNITY): Payer: Medicare Other

## 2017-01-22 NOTE — Telephone Encounter (Signed)
Returned patient's call regarding her per-op appt. Patietn left message stating "I haven't heard anything yet for the instructions on my procedure." Per Melissa APP, explained to the patient that the per testing area will call her this Thursday or Friday. Patient verbalized understanding.

## 2017-01-23 ENCOUNTER — Encounter (HOSPITAL_BASED_OUTPATIENT_CLINIC_OR_DEPARTMENT_OTHER): Payer: Self-pay | Admitting: *Deleted

## 2017-01-23 NOTE — Progress Notes (Signed)
Chart reviewed by Dr. Royce Macadamia. Dr. Royce Macadamia aware of recommendations from Dr. Tamala Julian to continue both ASA and brilinta.  Ok to proceed w surgery per Dr. Royce Macadamia.pt called and informed to continue meds. Pt verbalized her understanding.

## 2017-01-23 NOTE — Progress Notes (Signed)
Pt instructed npo pmn x prozac w sip of water.  Will check w Dr. Denman George regarding brilinta and ASA. Pt to arrive @ 0615.  Needs istat on arrival.  ekg in epic.

## 2017-01-23 NOTE — Progress Notes (Signed)
Spoke w Joylene John, NP for Dr. Denman George.  Pt to continue both ASA and brilinta per Dr. Tamala Julian. Melissa will fax recommendation.

## 2017-01-24 ENCOUNTER — Encounter (HOSPITAL_COMMUNITY): Payer: Medicare Other

## 2017-01-28 ENCOUNTER — Ambulatory Visit (HOSPITAL_BASED_OUTPATIENT_CLINIC_OR_DEPARTMENT_OTHER)
Admission: RE | Admit: 2017-01-28 | Discharge: 2017-01-28 | Disposition: A | Discharge status: Home / Self Care | Payer: Medicare Other | Source: Ambulatory Visit | Attending: Gynecologic Oncology | Admitting: Gynecologic Oncology

## 2017-01-28 ENCOUNTER — Ambulatory Visit (HOSPITAL_BASED_OUTPATIENT_CLINIC_OR_DEPARTMENT_OTHER): Payer: Medicare Other | Admitting: Anesthesiology

## 2017-01-28 ENCOUNTER — Other Ambulatory Visit: Payer: Self-pay

## 2017-01-28 ENCOUNTER — Encounter (HOSPITAL_BASED_OUTPATIENT_CLINIC_OR_DEPARTMENT_OTHER)
Admission: RE | Disposition: A | Discharge status: Home / Self Care | Payer: Self-pay | Source: Ambulatory Visit | Attending: Gynecologic Oncology

## 2017-01-28 ENCOUNTER — Encounter (HOSPITAL_BASED_OUTPATIENT_CLINIC_OR_DEPARTMENT_OTHER): Payer: Self-pay | Admitting: *Deleted

## 2017-01-28 ENCOUNTER — Ambulatory Visit (HOSPITAL_COMMUNITY)
Admission: RE | Admit: 2017-01-28 | Discharge: 2017-01-28 | Disposition: A | Discharge status: Home / Self Care | Payer: Medicare Other | Source: Ambulatory Visit | Attending: Gynecologic Oncology | Admitting: Gynecologic Oncology

## 2017-01-28 DIAGNOSIS — I252 Old myocardial infarction: Secondary | ICD-10-CM | POA: Diagnosis not present

## 2017-01-28 DIAGNOSIS — N889 Noninflammatory disorder of cervix uteri, unspecified: Secondary | ICD-10-CM

## 2017-01-28 DIAGNOSIS — Z87891 Personal history of nicotine dependence: Secondary | ICD-10-CM | POA: Insufficient documentation

## 2017-01-28 DIAGNOSIS — N888 Other specified noninflammatory disorders of cervix uteri: Secondary | ICD-10-CM | POA: Diagnosis present

## 2017-01-28 DIAGNOSIS — Y838 Other surgical procedures as the cause of abnormal reaction of the patient, or of later complication, without mention of misadventure at the time of the procedure: Secondary | ICD-10-CM | POA: Insufficient documentation

## 2017-01-28 DIAGNOSIS — Z7982 Long term (current) use of aspirin: Secondary | ICD-10-CM | POA: Insufficient documentation

## 2017-01-28 DIAGNOSIS — E785 Hyperlipidemia, unspecified: Secondary | ICD-10-CM | POA: Diagnosis not present

## 2017-01-28 DIAGNOSIS — I255 Ischemic cardiomyopathy: Secondary | ICD-10-CM | POA: Insufficient documentation

## 2017-01-28 DIAGNOSIS — S3141XA Laceration without foreign body of vagina and vulva, initial encounter: Secondary | ICD-10-CM | POA: Diagnosis not present

## 2017-01-28 DIAGNOSIS — F329 Major depressive disorder, single episode, unspecified: Secondary | ICD-10-CM | POA: Insufficient documentation

## 2017-01-28 DIAGNOSIS — Z955 Presence of coronary angioplasty implant and graft: Secondary | ICD-10-CM | POA: Insufficient documentation

## 2017-01-28 DIAGNOSIS — Z79899 Other long term (current) drug therapy: Secondary | ICD-10-CM | POA: Diagnosis not present

## 2017-01-28 DIAGNOSIS — I2510 Atherosclerotic heart disease of native coronary artery without angina pectoris: Secondary | ICD-10-CM | POA: Insufficient documentation

## 2017-01-28 DIAGNOSIS — F419 Anxiety disorder, unspecified: Secondary | ICD-10-CM | POA: Insufficient documentation

## 2017-01-28 DIAGNOSIS — N882 Stricture and stenosis of cervix uteri: Secondary | ICD-10-CM | POA: Insufficient documentation

## 2017-01-28 DIAGNOSIS — R9389 Abnormal findings on diagnostic imaging of other specified body structures: Secondary | ICD-10-CM

## 2017-01-28 HISTORY — DX: Presence of spectacles and contact lenses: Z97.3

## 2017-01-28 HISTORY — PX: DILATION AND CURETTAGE OF UTERUS: SHX78

## 2017-01-28 HISTORY — DX: Unspecified osteoarthritis, unspecified site: M19.90

## 2017-01-28 HISTORY — DX: Presence of tooth-root and mandibular implants: Z96.5

## 2017-01-28 HISTORY — PX: CERVICAL CONIZATION W/BX: SHX1330

## 2017-01-28 LAB — POCT I-STAT 4, (NA,K, GLUC, HGB,HCT)
GLUCOSE: 98 mg/dL (ref 65–99)
HEMATOCRIT: 42 % (ref 36.0–46.0)
Hemoglobin: 14.3 g/dL (ref 12.0–15.0)
Potassium: 3.8 mmol/L (ref 3.5–5.1)
SODIUM: 141 mmol/L (ref 135–145)

## 2017-01-28 SURGERY — DILATION AND CURETTAGE
Anesthesia: General | Site: Uterus

## 2017-01-28 MED ORDER — SCOPOLAMINE 1 MG/3DAYS TD PT72
1.0000 | MEDICATED_PATCH | TRANSDERMAL | Status: DC
  Administered 2017-01-28: 1.5 mg via TRANSDERMAL
  Administered 2017-01-28: 1 via TRANSDERMAL
  Filled 2017-01-28: qty 1

## 2017-01-28 MED ORDER — SCOPOLAMINE 1 MG/3DAYS TD PT72
MEDICATED_PATCH | TRANSDERMAL | Status: AC
  Filled 2017-01-28: qty 1

## 2017-01-28 MED ORDER — LIDOCAINE HCL (CARDIAC) 20 MG/ML IV SOLN
INTRAVENOUS | Status: DC | PRN
  Administered 2017-01-28: 80 mg via INTRAVENOUS

## 2017-01-28 MED ORDER — HYDROCODONE-ACETAMINOPHEN 5-325 MG PO TABS
1.0000 | ORAL_TABLET | ORAL | Status: DC | PRN
  Filled 2017-01-28: qty 1

## 2017-01-28 MED ORDER — DEXAMETHASONE SODIUM PHOSPHATE 4 MG/ML IJ SOLN
INTRAMUSCULAR | Status: DC | PRN
  Administered 2017-01-28: 10 mg via INTRAVENOUS

## 2017-01-28 MED ORDER — KETOROLAC TROMETHAMINE 30 MG/ML IJ SOLN
INTRAMUSCULAR | Status: AC
  Filled 2017-01-28: qty 1

## 2017-01-28 MED ORDER — EPHEDRINE 5 MG/ML INJ
INTRAVENOUS | Status: AC
  Filled 2017-01-28: qty 10

## 2017-01-28 MED ORDER — HYDROCODONE-ACETAMINOPHEN 5-325 MG PO TABS: 1.0000 | ORAL_TABLET | ORAL | 0 refills | Status: DC | PRN

## 2017-01-28 MED ORDER — PROMETHAZINE HCL 25 MG/ML IJ SOLN
6.2500 mg | INTRAMUSCULAR | Status: DC | PRN
  Filled 2017-01-28: qty 1

## 2017-01-28 MED ORDER — FENTANYL CITRATE (PF) 100 MCG/2ML IJ SOLN
INTRAMUSCULAR | Status: DC | PRN
  Administered 2017-01-28 (×4): 25 ug via INTRAVENOUS

## 2017-01-28 MED ORDER — LIDOCAINE HCL 1 % IJ SOLN
INTRAMUSCULAR | Status: DC | PRN
  Administered 2017-01-28: 8 mL

## 2017-01-28 MED ORDER — LACTATED RINGERS IV SOLN
INTRAVENOUS | Status: DC
  Administered 2017-01-28 (×2): via INTRAVENOUS
  Filled 2017-01-28: qty 1000

## 2017-01-28 MED ORDER — LIDOCAINE 2% (20 MG/ML) 5 ML SYRINGE
INTRAMUSCULAR | Status: AC
  Filled 2017-01-28: qty 5

## 2017-01-28 MED ORDER — HYDROCODONE-ACETAMINOPHEN 5-325 MG PO TABS
1.0000 | ORAL_TABLET | Freq: Once | ORAL | Status: AC
  Administered 2017-01-28: 1 via ORAL
  Filled 2017-01-28: qty 1

## 2017-01-28 MED ORDER — KETOROLAC TROMETHAMINE 30 MG/ML IJ SOLN
INTRAMUSCULAR | Status: DC | PRN
  Administered 2017-01-28: 30 mg via INTRAVENOUS

## 2017-01-28 MED ORDER — ONDANSETRON HCL 4 MG/2ML IJ SOLN
INTRAMUSCULAR | Status: AC
  Filled 2017-01-28: qty 2

## 2017-01-28 MED ORDER — PROPOFOL 10 MG/ML IV BOLUS
INTRAVENOUS | Status: DC | PRN
  Administered 2017-01-28: 140 mg via INTRAVENOUS

## 2017-01-28 MED ORDER — 0.9 % SODIUM CHLORIDE (POUR BTL) OPTIME
TOPICAL | Status: DC | PRN
  Administered 2017-01-28: 1000 mL

## 2017-01-28 MED ORDER — MIDAZOLAM HCL 2 MG/2ML IJ SOLN
INTRAMUSCULAR | Status: AC
  Filled 2017-01-28: qty 2

## 2017-01-28 MED ORDER — FENTANYL CITRATE (PF) 100 MCG/2ML IJ SOLN
INTRAMUSCULAR | Status: AC
  Filled 2017-01-28: qty 2

## 2017-01-28 MED ORDER — HYDROMORPHONE HCL 1 MG/ML IJ SOLN
0.2500 mg | INTRAMUSCULAR | Status: DC | PRN
  Filled 2017-01-28: qty 0.5

## 2017-01-28 MED ORDER — ONDANSETRON HCL 4 MG/2ML IJ SOLN
INTRAMUSCULAR | Status: DC | PRN
  Administered 2017-01-28: 4 mg via INTRAVENOUS

## 2017-01-28 MED ORDER — IBUPROFEN 600 MG PO TABS: 600.0000 mg | ORAL_TABLET | Freq: Four times a day (QID) | ORAL | 0 refills | Status: DC | PRN

## 2017-01-28 MED ORDER — SODIUM CHLORIDE 0.9 % IR SOLN
Status: DC | PRN
  Administered 2017-01-28: 200 mL via INTRAVESICAL

## 2017-01-28 MED ORDER — MIDAZOLAM HCL 5 MG/5ML IJ SOLN
INTRAMUSCULAR | Status: DC | PRN
  Administered 2017-01-28: 2 mg via INTRAVENOUS

## 2017-01-28 MED ORDER — EPHEDRINE SULFATE 50 MG/ML IJ SOLN
INTRAMUSCULAR | Status: DC | PRN
  Administered 2017-01-28 (×2): 10 mg via INTRAVENOUS

## 2017-01-28 MED ORDER — PROPOFOL 10 MG/ML IV BOLUS
INTRAVENOUS | Status: AC
  Filled 2017-01-28: qty 20

## 2017-01-28 MED ORDER — HYDROCODONE-ACETAMINOPHEN 5-325 MG PO TABS
ORAL_TABLET | ORAL | Status: AC
  Filled 2017-01-28: qty 1

## 2017-01-28 MED ORDER — DEXAMETHASONE SODIUM PHOSPHATE 10 MG/ML IJ SOLN
INTRAMUSCULAR | Status: AC
  Filled 2017-01-28: qty 1

## 2017-01-28 SURGICAL SUPPLY — 48 items
APPLICATOR COTTON TIP 6IN STRL (MISCELLANEOUS) ×3 IMPLANT
BLADE EXTENDED COATED 6.5IN (ELECTRODE) ×3 IMPLANT
BLADE SURG 11 STRL SS (BLADE) ×3 IMPLANT
CANISTER SUCT 3000ML PPV (MISCELLANEOUS) IMPLANT
CATH ROBINSON RED A/P 16FR (CATHETERS) ×3 IMPLANT
CLEANER CAUTERY TIP 5X5 PAD (MISCELLANEOUS) ×2 IMPLANT
COVER BACK TABLE 60X90IN (DRAPES) ×3 IMPLANT
DRAPE LG THREE QUARTER DISP (DRAPES) ×3 IMPLANT
DRAPE UNDERBUTTOCKS STRL (DRAPE) ×3 IMPLANT
DRSG TELFA 3X8 NADH (GAUZE/BANDAGES/DRESSINGS) ×6 IMPLANT
ELECT BALL LEEP 5MM RED (ELECTRODE) IMPLANT
ELECT BLADE 6.5 .24CM SHAFT (ELECTRODE) ×3 IMPLANT
ELECT REM PT RETURN 9FT ADLT (ELECTROSURGICAL) ×3
ELECTRODE REM PT RTRN 9FT ADLT (ELECTROSURGICAL) ×2 IMPLANT
GLOVE BIO SURGEON STRL SZ 6 (GLOVE) ×6 IMPLANT
GOWN STRL REUS W/ TWL LRG LVL3 (GOWN DISPOSABLE) ×2 IMPLANT
GOWN STRL REUS W/TWL LRG LVL3 (GOWN DISPOSABLE) ×1
GOWN STRL REUS W/TWL LRG LVL4 (GOWN DISPOSABLE) ×3 IMPLANT
IV SOD CHL 0.9% 1000ML (IV SOLUTION) ×3 IMPLANT
KIT RM TURNOVER CYSTO AR (KITS) ×3 IMPLANT
LEGGING LITHOTOMY PAIR STRL (DRAPES) ×3 IMPLANT
MANIFOLD NEPTUNE II (INSTRUMENTS) IMPLANT
NEEDLE SPNL 22GX3.5 QUINCKE BK (NEEDLE) ×3 IMPLANT
NS IRRIG 500ML POUR BTL (IV SOLUTION) ×3 IMPLANT
PACK VAGINAL MINOR WOMEN LF (CUSTOM PROCEDURE TRAY) ×3 IMPLANT
PAD CLEANER CAUTERY TIP 5X5 (MISCELLANEOUS) ×1
PAD OB MATERNITY 4.3X12.25 (PERSONAL CARE ITEMS) ×3 IMPLANT
PAD PREP 24X48 CUFFED NSTRL (MISCELLANEOUS) ×3 IMPLANT
PENCIL BUTTON HOLSTER BLD 10FT (ELECTRODE) ×3 IMPLANT
PLUG CATH AND CAP STER (CATHETERS) ×3 IMPLANT
SCOPETTES 8  STERILE (MISCELLANEOUS) ×1
SCOPETTES 8 STERILE (MISCELLANEOUS) ×2 IMPLANT
SET IRRIG Y TYPE TUR BLADDER L (SET/KITS/TRAYS/PACK) ×3 IMPLANT
SPONGE SURGIFOAM ABS GEL 12-7 (HEMOSTASIS) IMPLANT
SUT VIC AB 0 CT1 36 (SUTURE) IMPLANT
SUT VIC AB 2-0 CT2 27 (SUTURE) IMPLANT
SUT VIC AB 2-0 UR6 27 (SUTURE) IMPLANT
SUT VIC AB 3-0 PS2 18 (SUTURE) ×4
SUT VIC AB 3-0 PS2 18XBRD (SUTURE) ×8 IMPLANT
SUT VICRYL 0 UR6 27IN ABS (SUTURE) IMPLANT
SYR BULB IRRIGATION 50ML (SYRINGE) ×3 IMPLANT
SYR CONTROL 10ML LL (SYRINGE) ×3 IMPLANT
TOWEL OR 17X24 6PK STRL BLUE (TOWEL DISPOSABLE) ×3 IMPLANT
TRAY FOLEY CATH SILVER 14FR (SET/KITS/TRAYS/PACK) ×3 IMPLANT
TUBE CONNECTING 12X1/4 (SUCTIONS) ×3 IMPLANT
VACUUM HOSE/TUBING 7/8INX6FT (MISCELLANEOUS) IMPLANT
WATER STERILE IRR 500ML POUR (IV SOLUTION) ×3 IMPLANT
YANKAUER SUCT BULB TIP NO VENT (SUCTIONS) ×3 IMPLANT

## 2017-01-28 NOTE — Anesthesia Postprocedure Evaluation (Signed)
Anesthesia Post Note  Patient: Carol Ferguson  Procedure(s) Performed: DILATATION AND CURETTAGE WITH ULTRASOUND GUIDENCE (N/A Uterus) CONIZATION CERVIX WITH BIOPSY (N/A Cervix)     Patient location during evaluation: PACU Anesthesia Type: General Level of consciousness: awake Pain management: pain level controlled Vital Signs Assessment: post-procedure vital signs reviewed and stable Respiratory status: spontaneous breathing Cardiovascular status: stable Postop Assessment: no apparent nausea or vomiting Anesthetic complications: no    Last Vitals:  Vitals:   01/28/17 1000 01/28/17 1015  BP: 117/62 129/66  Pulse: 75 69  Resp: 15 13  Temp:    SpO2: 100% 100%    Last Pain:  Vitals:   01/28/17 1045  TempSrc:   PainSc: 7    Pain Goal: Patients Stated Pain Goal: 8 (01/28/17 0737)               Otha Rickles JR,JOHN Mateo Flow

## 2017-01-28 NOTE — Transfer of Care (Signed)
Immediate Anesthesia Transfer of Care Note  Patient: Carol Ferguson  Procedure(s) Performed: Procedure(s) (LRB): DILATATION AND CURETTAGE WITH ULTRASOUND GUIDENCE (N/A) CONIZATION CERVIX WITH BIOPSY (N/A)  Patient Location: PACU  Anesthesia Type: General  Level of Consciousness: awake, sedated, patient cooperative and responds to stimulation  Airway & Oxygen Therapy: Patient Spontanous Breathing and Patient connected to  NCO2  Post-op Assessment: Report given to PACU RN, Post -op Vital signs reviewed and stable and Patient moving all extremities  Post vital signs: Reviewed and stable  Complications: No apparent anesthesia complications

## 2017-01-28 NOTE — Anesthesia Procedure Notes (Signed)
Procedure Name: LMA Insertion Date/Time: 01/28/2017 8:42 AM Performed by: Justice Rocher Pre-anesthesia Checklist: Patient identified, Emergency Drugs available, Suction available and Patient being monitored Patient Re-evaluated:Patient Re-evaluated prior to induction Oxygen Delivery Method: Circle system utilized Preoxygenation: Pre-oxygenation with 100% oxygen Induction Type: IV induction Ventilation: Mask ventilation without difficulty LMA: LMA inserted LMA Size: 4.0 Number of attempts: 1 Airway Equipment and Method: Bite block Placement Confirmation: positive ETCO2 and breath sounds checked- equal and bilateral Tube secured with: Tape Dental Injury: Teeth and Oropharynx as per pre-operative assessment

## 2017-01-28 NOTE — Anesthesia Preprocedure Evaluation (Addendum)
Anesthesia Evaluation  Patient identified by MRN, date of birth, ID band Patient awake    Reviewed: Allergy & Precautions, NPO status , Patient's Chart, lab work & pertinent test results  History of Anesthesia Complications Negative for: history of anesthetic complications  Airway Mallampati: II  TM Distance: >3 FB Neck ROM: Full    Dental no notable dental hx. (+) Dental Advisory Given   Pulmonary former smoker,    Pulmonary exam normal        Cardiovascular + CAD, + Past MI and + Cardiac Stents  Normal cardiovascular exam  Study Conclusions  - Left ventricle: The cavity size was normal. Systolic function was   normal. The estimated ejection fraction was in the range of 55%   to 60%. Probable severe hypokinesis of the entireinferior   myocardium.    Neuro/Psych PSYCHIATRIC DISORDERS Anxiety Depression negative neurological ROS     GI/Hepatic negative GI ROS, Neg liver ROS,   Endo/Other  negative endocrine ROS  Renal/GU negative Renal ROS  negative genitourinary   Musculoskeletal negative musculoskeletal ROS (+)   Abdominal   Peds negative pediatric ROS (+)  Hematology negative hematology ROS (+)   Anesthesia Other Findings   Reproductive/Obstetrics negative OB ROS                            Anesthesia Physical Anesthesia Plan  ASA: III  Anesthesia Plan: General   Post-op Pain Management:    Induction: Intravenous  PONV Risk Score and Plan: 4 or greater and Ondansetron, Dexamethasone, Scopolamine patch - Pre-op and Treatment may vary due to age or medical condition  Airway Management Planned: LMA  Additional Equipment:   Intra-op Plan:   Post-operative Plan: Extubation in OR  Informed Consent: I have reviewed the patients History and Physical, chart, labs and discussed the procedure including the risks, benefits and alternatives for the proposed anesthesia with the  patient or authorized representative who has indicated his/her understanding and acceptance.   Dental advisory given  Plan Discussed with: CRNA and Anesthesiologist  Anesthesia Plan Comments:         Anesthesia Quick Evaluation

## 2017-01-28 NOTE — Discharge Instructions (Signed)
Report to the [facility] for heavy vaginal bleeding.   For questions call [phone]. Pelvic rest (nothing in the vagina) for 6 weeks.  Take ibuprofen or tylenol every 6 hours for pain. It is normal to have cramping pain after a D&C. If the pain is more severe than can be relieved with tylenol or ibuprofen, please call the number above.  Call your surgeon if you experience:   1.  Fever over 101.0. 2.  Inability to urinate. 3.  Nausea and/or vomiting. 4.  Extreme swelling or bruising at the surgical site. 5.  Continued bleeding from the incision. 6.  Increased pain, redness or drainage from the incision. 7.  Problems related to your pain medication. 8.  Any problems and/or concerns  Post Anesthesia Home Care Instructions  Activity: Get plenty of rest for the remainder of the day. A responsible individual must stay with you for 24 hours following the procedure.  For the next 24 hours, DO NOT: -Drive a car -Paediatric nurse -Drink alcoholic beverages -Take any medication unless instructed by your physician -Make any legal decisions or sign important papers.  Meals: Start with liquid foods such as gelatin or soup. Progress to regular foods as tolerated. Avoid greasy, spicy, heavy foods. If nausea and/or vomiting occur, drink only clear liquids until the nausea and/or vomiting subsides. Call your physician if vomiting continues.  Special Instructions/Symptoms: Your throat may feel dry or sore from the anesthesia or the breathing tube placed in your throat during surgery. If this causes discomfort, gargle with warm salt water. The discomfort should disappear within 24 hours.  If you had a scopolamine patch placed behind your ear for the management of post- operative nausea and/or vomiting:  1. The medication in the patch is effective for 72 hours, after which it should be removed.  Wrap patch in a tissue and discard in the trash. Wash hands thoroughly with soap and water. 2.  You may remove the patch earlier than 72 hours if you experience unpleasant side effects which may include dry mouth, dizziness or visual disturbances. 3. Avoid touching the patch. Wash your hands with soap and water after contact with the patch.

## 2017-01-28 NOTE — Op Note (Signed)
OPERATIVE NOTE  PATIENT: Carol Ferguson DATE: 01/28/17   Preop Diagnosis: cervical mass, thickened endometrium on [place]  Postoperative Diagnosis: same  Surgery: ultrasound guided dilation of cervix with D&C and cervical biopsy  Surgeons:  Donaciano Eva, MD  Anesthesia: General   Estimated blood loss: 50 ml  IVF:  238ml   Urine output: retrograde filled, not recorded  Complications: None   Pathology: none  Operative findings: 6cm uterus, dilated endometrial cavity, very stenotic cervical os. Thickened, fibrotic cervix without discrete mass, grossly normal cervical mucosa.  Procedure: The patient was identified in the preoperative holding area. Informed consent was signed on the chart. Patient was seen history was reviewed and exam was performed.   The patient was then taken to the operating room and placed in the supine position with SCD hose on. General anesthesia was then induced without difficulty. She was then placed in the dorsolithotomy position. The perineum was prepped with Betadine. The vagina was prepped with Betadine.   Timeout was performed the patient, procedure, antibiotic, allergy, and length of procedure.   The patient was then draped after the prep was dried. A foley catheter was placed sterile conditions and was backfilled with 300cc of sterile saline.  The speculum was placed in the vagina.   The paracervical tissues were infiltrated with 8cc of 1% lidocaine.  Os finders were performed using ultrasound guidance to ensure intracavitary (uterine) location. The cervical os was progressively dilated under direct visualization.  A uterine sound was introduced to 6cm. Progressive cervical dilation took placed under constant direct visualization to ensure intracavitary placement of dilators. The cervix was very fibrotic and difficult to dilate offering significant resistance.  Once adequately dilated the endometrial cavity was curetted. A gritty  feel was noted. The curette took place under constant US guidance. Small volume of tissue was retrieved.  The 11 blade scalpel was then used to take a small piece of tissue from the posterior cervical stroma. Hemostasis was achieved with the bovie.  Bleeding was noted at vaginal tears at the posterior and right introitus where the speculum had stretched the mucosa. These were reapproximated with 3-0 vicryl suture for hemostasis.  All instrument, suture, laparotomy, Ray-Tec, and needle counts were correct.  This is [name] dictating an operative note on Carol Ferguson, Carol Battles, MD

## 2017-01-28 NOTE — H&P (Signed)
CC:     Chief Complaint  Patient presents with  . Cervical mass    Assessment/Plan:  Carol Ferguson  is a 66 y.o.  year old with a 2cm cervical mass, in the setting of a normal pap and HPV negative result. However, her cervix is hard and abnormal in consistency (though normal in appearance). She cannot tolerate an office exam due to cervical stenosis and discomfort. ECC and MRI were both negative for findings suggestive of cancer, though the endometrium was thickened. She is recovering from a circumflex artery MI in June, 2018 (Dr Daneen Schick is her cardiologist) with a coronary stent placed. She is on anti-platelet therapy.  I am recommending exam under anesthesia with endocervical sampling, and D&C under ultrasound guidance. She can remain on her anti-platelet therapy for this procedure, though, I explained it will increase her risk for bleeding somewhat.  We will discuss with Dr Tamala Julian the safety of such a procedure.   HPI: Carol Ferguson is a very pleasant 66 year old P0 who is seen in consultation at the request of Dr Dellis Filbert for a cervical mass.  The patient has a long history of smoking. Her last pap was in 2012 and was normal.  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 This was treated with a stent in the circumflex artery ([employer]tr[employer], Resolute onyx zotarolimus-eluting stent).  She has excellent cardiac function postprocedure and has resumed normal activities, though had quit smoking June, 2018.  The patient began developing right lower quadrant discomfort in July, 2018 with intermittent sharp shooting pains. She reported this to [name], her PCP who performed a TVUS which showed a uterus measuring 7.2x3.5x4.1cm with a small mass in the lower uterine segment measuring 78mm (likely small fibroid). There appears to be a mass centered in the cervix extending into the lower uterine body measuring 2.2x2x1.9cm. The endometrium measures 4mm. The ovaries were  normal.  She denies bleeding.   Biopsy was attempted on 12/18/16 which was unsuccessful due to patient discomfort. Pap was normal with no HR HPV detected.   ECC on 12/27/16 was benign. MRI on 01/02/17 showed no hyperintense signal in the cervical stroma suggestive of cancer, the endometrium was dilated and irregular. The cervical canal was displaced anterioraly.  Current Meds:      Outpatient Encounter Prescriptions as of 12/27/2016  Medication Sig  . acetaminophen (TYLENOL) 500 MG tablet Take 500 mg by mouth every 6 (six) hours as needed.  Marland Kitchen aspirin 81 MG chewable tablet Chew 1 tablet (81 mg total) by mouth daily.  Marland Kitchen atorvastatin (LIPITOR) 10 MG tablet Take 1 tablet (10 mg total) by mouth daily.  Marland Kitchen BRILINTA 90 MG TABS tablet TAKE 1 TABLET BY MOUTH TWICE A DAY  . clonazePAM (KLONOPIN) 0.5 MG tablet Take 0.5 tablets (0.25 mg total) by mouth 2 (two) times daily as needed for anxiety.  Marland Kitchen FLUoxetine (PROZAC) 10 MG capsule Take 1 capsule (10 mg total) by mouth daily.  . nitroGLYCERIN (NITROSTAT) 0.4 MG SL tablet Place 1 tablet (0.4 mg total) under the tongue every 5 (five) minutes x 3 doses as needed for chest pain.  . [DISCONTINUED] BRILINTA 90 MG TABS tablet TAKE 1 TABLET BY MOUTH TWICE A DAY   No facility-administered encounter medications on file as of 12/27/2016.     Allergy:       Allergies  Allergen Reactions  . Flagyl [Metronidazole] Nausea Only    DIARRHEA    Social Hx:   Social History  Social History  . Marital status: Divorced    Spouse name: N/A  . Number of children: N/A  . Years of education: N/A      Occupational History  . Not on file.         Social History Main Topics  . Smoking status: Former Smoker    Packs/day: 1.00    Years: 35.00    Types: Cigarettes    Quit date: 09/26/2016  . Smokeless tobacco: Never Used  . Alcohol use Yes     Comment: occasional  . Drug use: No  . Sexual activity: No       Other Topics Concern   . Not on file      Social History Narrative  . No narrative on file    Past Surgical Hx:       Past Surgical History:  Procedure Laterality Date  . CHOLECYSTECTOMY    . CORONARY STENT INTERVENTION N/A 09/26/2016   Procedure: Coronary Stent Intervention;  Surgeon: Belva Crome, MD;  Location: [facility];  Service: Cardiovascular;  Laterality: N/A;  . LEFT HEART CATH AND CORONARY ANGIOGRAPHY N/A 09/26/2016   Procedure: Left Heart Cath and Coronary Angiography;  Surgeon: Belva Crome, MD;  Location: [facility];  Service: Cardiovascular;  Laterality: N/A;    Past Medical Hx:      Past Medical History:  Diagnosis Date  . Anxiety   . CAD (coronary artery disease)    a. Inf STEMI/LHC 09/2016 which showed occluded mid LCx treated with PCI, DES; remaining cath details included a proximal to mid LAD lesion of 20% stenosis, and proximal to mid RCA 25% stenosis. EF 55-60%.  . Depression   . Ischemic cardiomyopathy    a. 2D Echo 09/28/16 showed EF 55-60%, probable severe hypokinesis of the entire inferiormyocardium, normal diastolic parameters.  . Myocardial infarction ([place]) 09/26/2016  . Tobacco abuse     Past Gynecological History:  Nulliparous, menopause age 38 No LMP recorded. Patient is postmenopausal.  Family Hx:       Family History  Problem Relation Age of Onset  . Heart attack Brother   . Cancer Brother        MELANOMA  . Heart disease Father   . Stroke Father   . Heart disease Brother   . Diabetes Brother     Review of Systems:  Constitutional  Feels well,    ENT Normal appearing ears and nares bilaterally Skin/Breast  No rash, sores, jaundice, itching, dryness Cardiovascular  No chest pain, shortness of breath, or edema  Pulmonary  No cough or wheeze.  Gastro Intestinal  No nausea, vomitting, or diarrhoea. No bright red blood per rectum, no abdominal pain, change in bowel movement, or constipation.  Genito Urinary   No frequency, urgency, dysuria, no bleeding Musculo Skeletal  No myalgia, arthralgia, joint swelling or pain  Neurologic  No weakness, numbness, change in gait,  Psychology  No depression, anxiety, insomnia.   Vitals:  Blood pressure (!) 108/44, pulse 66, temperature 98.5 F (36.9 C), temperature source Oral, resp. rate 18, height 5\' 5"  (1.651 m), weight 66.2 kg (146 lb), SpO2 98 %.  Physical Exam: WD in NAD Neck  Supple NROM, without any enlargements.  Lymph Node Survey No cervical supraclavicular or inguinal adenopathy Cardiovascular  Pulse normal rate, regularity and rhythm. S1 and S2 normal.  Lungs  Clear to auscultation bilateraly, without wheezes/crackles/rhonchi. Good air movement.  Skin  No rash/lesions/breakdown  Psychiatry  Alert and  oriented to person, place, and time  Abdomen  Normoactive bowel sounds, abdomen soft, non-tender and thin without evidence of hernia.  Back No CVA tenderness Genito Urinary  Vulva/vagina: Normal external female genitalia.  No lesions. No discharge or bleeding.             Bladder/urethra:  No lesions or masses, well supported bladder             Vagina: grossly normal             Cervix: Normal appearing, however very firm to palpate. Overlying mucosa erythematous but without gross lesions. Pinpoint os. Unable to dilate- very rigid.             Uterus:  Small, mobile, no parametrial involvement or nodularity.             Adnexa: no palpable masses. Rectal  Good tone, no masses no cul de sac nodularity. No parametrial disease. Extremities  No bilateral cyanosis, clubbing or edema.   Donaciano Eva, MD

## 2017-01-29 ENCOUNTER — Encounter (HOSPITAL_BASED_OUTPATIENT_CLINIC_OR_DEPARTMENT_OTHER): Payer: Self-pay | Admitting: Gynecologic Oncology

## 2017-01-29 ENCOUNTER — Encounter (HOSPITAL_COMMUNITY): Payer: Medicare Other

## 2017-01-29 ENCOUNTER — Telehealth: Payer: Self-pay | Admitting: Gynecologic Oncology

## 2017-01-29 NOTE — Telephone Encounter (Signed)
Returned call to patient.  She called this am and stated she gained 5 lbs from yesterday.  Patient stating she feels great, better than she has.  No difficulty breathing or cough.  Tolerating diet, sharp pain in her abd has resolved.  Only having light spotting.  Dr. Denman George aware and advised patient to monitor and call if her weight continues to increase.  Patient advised to call for any needs.

## 2017-01-31 ENCOUNTER — Encounter (HOSPITAL_COMMUNITY): Payer: Medicare Other

## 2017-02-01 ENCOUNTER — Encounter (HOSPITAL_COMMUNITY): Payer: Self-pay | Admitting: Emergency Medicine

## 2017-02-01 ENCOUNTER — Emergency Department (HOSPITAL_COMMUNITY)
Admission: EM | Admit: 2017-02-01 | Discharge: 2017-02-01 | Disposition: A | Discharge status: Home / Self Care | Payer: Medicare Other | Attending: Emergency Medicine | Admitting: Emergency Medicine

## 2017-02-01 ENCOUNTER — Other Ambulatory Visit: Payer: Self-pay

## 2017-02-01 DIAGNOSIS — I252 Old myocardial infarction: Secondary | ICD-10-CM | POA: Diagnosis not present

## 2017-02-01 DIAGNOSIS — R42 Dizziness and giddiness: Secondary | ICD-10-CM | POA: Diagnosis not present

## 2017-02-01 DIAGNOSIS — R531 Weakness: Secondary | ICD-10-CM | POA: Diagnosis not present

## 2017-02-01 DIAGNOSIS — I2510 Atherosclerotic heart disease of native coronary artery without angina pectoris: Secondary | ICD-10-CM | POA: Insufficient documentation

## 2017-02-01 DIAGNOSIS — Z87891 Personal history of nicotine dependence: Secondary | ICD-10-CM | POA: Insufficient documentation

## 2017-02-01 DIAGNOSIS — R9431 Abnormal electrocardiogram [ECG] [EKG]: Secondary | ICD-10-CM | POA: Diagnosis not present

## 2017-02-01 DIAGNOSIS — Z79899 Other long term (current) drug therapy: Secondary | ICD-10-CM | POA: Insufficient documentation

## 2017-02-01 DIAGNOSIS — I255 Ischemic cardiomyopathy: Secondary | ICD-10-CM | POA: Insufficient documentation

## 2017-02-01 DIAGNOSIS — E42 Marasmic kwashiorkor: Secondary | ICD-10-CM | POA: Diagnosis not present

## 2017-02-01 DIAGNOSIS — E860 Dehydration: Secondary | ICD-10-CM

## 2017-02-01 LAB — URINALYSIS, ROUTINE W REFLEX MICROSCOPIC
Bacteria, UA: NONE SEEN
Bilirubin Urine: NEGATIVE
GLUCOSE, UA: NEGATIVE mg/dL
Ketones, ur: NEGATIVE mg/dL
Nitrite: NEGATIVE
PH: 6 (ref 5.0–8.0)
Protein, ur: NEGATIVE mg/dL
SPECIFIC GRAVITY, URINE: 1.017 (ref 1.005–1.030)

## 2017-02-01 LAB — COMPREHENSIVE METABOLIC PANEL
ALBUMIN: 3.8 g/dL (ref 3.5–5.0)
ALK PHOS: 96 U/L (ref 38–126)
ALT: 15 U/L (ref 14–54)
AST: 18 U/L (ref 15–41)
Anion gap: 12 (ref 5–15)
BUN: 16 mg/dL (ref 6–20)
CALCIUM: 9.1 mg/dL (ref 8.9–10.3)
CO2: 22 mmol/L (ref 22–32)
CREATININE: 0.8 mg/dL (ref 0.44–1.00)
Chloride: 104 mmol/L (ref 101–111)
GFR calc Af Amer: >60 mL/min (ref >60)
GFR calc non Af Amer: >60 mL/min (ref >60)
GLUCOSE: 100 mg/dL — AB (ref 65–99)
Potassium: 3.7 mmol/L (ref 3.5–5.1)
SODIUM: 138 mmol/L (ref 135–145)
Total Bilirubin: 0.7 mg/dL (ref 0.3–1.2)
Total Protein: 7.2 g/dL (ref 6.5–8.1)

## 2017-02-01 LAB — CBC
HCT: 42.1 % (ref 36.0–46.0)
HEMOGLOBIN: 14.6 g/dL (ref 12.0–15.0)
MCH: 34.1 pg — AB (ref 26.0–34.0)
MCHC: 34.7 g/dL (ref 30.0–36.0)
MCV: 98.4 fL (ref 78.0–100.0)
PLATELETS: 245 10*3/uL (ref 150–400)
RBC: 4.28 MIL/uL (ref 3.87–5.11)
RDW: 12.7 % (ref 11.5–15.5)
WBC: 9.6 10*3/uL (ref 4.0–10.5)

## 2017-02-01 LAB — I-STAT TROPONIN, ED: TROPONIN I, POC: 0 ng/mL (ref 0.00–0.08)

## 2017-02-01 LAB — CBG MONITORING, ED: Glucose-Capillary: 102 mg/dL — ABNORMAL HIGH (ref 65–99)

## 2017-02-01 LAB — LIPASE, BLOOD: Lipase: 25 U/L (ref 11–51)

## 2017-02-01 MED ORDER — SODIUM CHLORIDE 0.9 % IV BOLUS (SEPSIS)
1000.0000 mL | Freq: Once | INTRAVENOUS | Status: AC
  Administered 2017-02-01: 1000 mL via INTRAVENOUS

## 2017-02-01 MED ORDER — ONDANSETRON 4 MG PO TBDP
4.0000 mg | ORAL_TABLET | Freq: Once | ORAL | Status: AC | PRN
  Administered 2017-02-01: 4 mg via ORAL
  Filled 2017-02-01: qty 1

## 2017-02-01 NOTE — ED Provider Notes (Signed)
[facility] DEPT Provider Note   CSN: [id number] Arrival date & time: 02/01/17  1036     History   Chief Complaint Chief Complaint  Patient presents with  . Fatigue  . Post-op Problem  . Dizziness    HPI Carol Ferguson is a 66 y.o. female.  HPI 66 year old female who presents with dizziness and fatigue. She has a history of CAD. She is status post D&C with cervical biopsy on October 9 for possible cervical mass. States that initially she was feeling fine, but over the past 2 days has had increased generalized weakness and fatigue. Endorses decreased appetite and decreased fluid intake during this period of time. Has not had fever, cough, shortness of breath, chest pain, lower extremity edema or calf tenderness, nausea or vomiting, diarrhea, melena or hematochezia, or significant vaginal bleeding. No dysuria but does endorse urinary frequency at nighttime. No syncope or near syncope, but states when she stand up from lying down or sitting, she would briefly feel lightheaded. No focal numbness/weakness, vision or speech changes, headaches.   Past Medical History:  Diagnosis Date  . Anxiety   . 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 CAD (coronary artery disease)    a. Inf STEMI/LHC 09/2016 which showed occluded mid LCx treated with PCI, DES; remaining cath details included a proximal to mid LAD lesion of 20% stenosis, and proximal to mid RCA 25% stenosis. EF 55-60%.  . Depression   . Ischemic cardiomyopathy    a. 2D Echo 09/28/16 showed EF 55-60%, probable severe hypokinesis of the entire inferiormyocardium, normal diastolic parameters.  . Myocardial infarction ([place]) 09/26/2016  . Presence of tooth-root and mandibular implants   . Tobacco abuse   . Wears glasses     Patient Active Problem List   Diagnosis Date Noted  . Diverticulosis 01/10/2017  . Cervical mass 12/27/2016  . CAD (coronary artery disease), native coronary artery 11/27/2016  . Chronic diastolic heart  failure ([place]) 09/27/2016  . Tobacco abuse 09/27/2016  . Hyperlipidemia with target LDL less than 70 09/27/2016  . 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 Lumbar degenerative disc disease 06/14/2016    Past Surgical History:  Procedure Laterality Date  . CERVICAL CONIZATION W/BX N/A 01/28/2017   Procedure: CONIZATION CERVIX WITH BIOPSY;  Surgeon: [name], MD;  [facility];  Service: Gynecology;  Laterality: N/A;  . CHOLECYSTECTOMY    . CORONARY STENT INTERVENTION N/A 09/26/2016   Procedure: Coronary Stent Intervention;  Surgeon: Belva Crome, MD;  Location: [facility];  Service: Cardiovascular;  Laterality: N/A;  . DILATION AND CURETTAGE OF UTERUS N/A 01/28/2017   Procedure: DILATATION AND CURETTAGE WITH ULTRASOUND GUIDENCE;  Surgeon: [name], MD;  Location: [facility];  Service: Gynecology;  Laterality: N/A;  . LEFT HEART CATH AND CORONARY ANGIOGRAPHY N/A 09/26/2016   Procedure: Left Heart Cath and Coronary Angiography;  Surgeon: Belva Crome, MD;  Location: [facility];  Service: Cardiovascular;  Laterality: N/A;    OB History    Gravida Para Term Preterm AB Living   0 0 0 0 0 0   SAB TAB Ectopic Multiple Live Births   0 0 0 0 0       Home Medications    Prior to Admission medications   Medication Sig Start Date End Date Taking? Authorizing Provider  acetaminophen (TYLENOL) 500 MG tablet Take 500 mg by mouth every 6 (six) hours  as needed.   Yes [provider]  aspirin 81 MG chewable tablet Chew 1 tablet (81 mg total) by mouth daily. 09/28/16  Yes Dunn, Areta Haber, PA-C  atorvastatin (LIPITOR) 10 MG tablet Take 1 tablet (10 mg total) by mouth daily. 12/02/16 03/02/17 Yes Belva Crome, MD  BRILINTA 90 MG TABS tablet TAKE 1 TABLET BY MOUTH TWICE A DAY 10/25/16  Yes Dunn, Dayna N, PA-C  clonazePAM (KLONOPIN) 0.5 MG tablet Take 0.5 tablets (0.25 mg total) by mouth 2 (two) times daily as needed for  anxiety. 01/10/17  Yes Timmothy Euler, [place] D, PA-C  FLUoxetine (PROZAC) 10 MG capsule Take 1 capsule (10 mg total) by mouth daily. 12/03/16  Yes Tenna Delaine D, PA-C  HYDROcodone-acetaminophen (NORCO/VICODIN) 5-325 MG tablet Take 1 tablet by mouth every 4 (four) hours as needed for moderate pain. 01/28/17  Yes [name], MD  nitroGLYCERIN (NITROSTAT) 0.4 MG SL tablet Place 1 tablet (0.4 mg total) under the tongue every 5 (five) minutes x 3 doses as needed for chest pain. 09/28/16  Yes Dunn, Areta Haber, PA-C  traZODone (DESYREL) 50 MG tablet Take 0.5-1 tablets (25-50 mg total) by mouth at bedtime as needed for sleep. 01/10/17  Yes Timmothy Euler, [place] D, PA-C  ibuprofen (ADVIL,MOTRIN) 600 MG tablet Take 1 tablet (600 mg total) by mouth every 6 (six) hours as needed. Patient not taking: Reported on 02/01/2017 01/28/17   [name], MD    Family History Family History  Problem Relation Age of Onset  . Heart attack Brother   . Cancer Brother        MELANOMA  . Heart disease Father   . Stroke Father   . Heart disease Brother   . Diabetes Brother     Social History Social History  Substance Use Topics  . Smoking status: Former Smoker    Packs/day: 1.00    Years: 35.00    Types: Cigarettes    Quit date: 09/26/2016  . Smokeless tobacco: Never Used  . Alcohol use Yes     Comment: occasional     Allergies   Flagyl [metronidazole]   Review of Systems Review of Systems  Constitutional: Negative for fever.  Respiratory: Negative for shortness of breath.   Cardiovascular: Negative for chest pain.  Gastrointestinal: Negative for blood in stool, constipation, diarrhea, nausea and vomiting.  Genitourinary: Negative for difficulty urinating and dysuria.  All other systems reviewed and are negative.    Physical Exam Updated Vital Signs BP 97/67   Pulse 60   Temp 97.9 F (36.6 C) (Oral)   Resp 17   Ht 5\' 5"  (1.651 m)   Wt 64 kg (141 lb)   SpO2 100%   BMI 23.46 kg/m   Physical  Exam Physical Exam  Nursing note and vitals reviewed. Constitutional: Well developed, well nourished, non-toxic, and in no acute distress Head: Normocephalic and atraumatic.  Mouth/Throat: Oropharynx is clear and mildly dry.  Neck: Normal range of motion. Neck supple.  Cardiovascular: Normal rate and regular rhythm.   Pulmonary/Chest: Effort normal and breath sounds normal.  Abdominal: Soft. There is no tenderness. There is no rebound and no guarding.  Musculoskeletal: Normal range of motion.  Neurological: Alert, no facial droop, fluent speech, moves all extremities symmetrically Skin: Skin is warm and dry.  Psychiatric: Cooperative   ED Treatments / Results  Labs (all labs ordered are listed, but only abnormal results are displayed) Labs Reviewed  COMPREHENSIVE METABOLIC PANEL - Abnormal; Notable for the following:  Result Value   Glucose, Bld 100 (*)    All other components within normal limits  CBC - Abnormal; Notable for the following:    MCH 34.1 (*)    All other components within normal limits  URINALYSIS, ROUTINE W REFLEX MICROSCOPIC - Abnormal; Notable for the following:    APPearance HAZY (*)    Hgb urine dipstick LARGE (*)    Leukocytes, UA TRACE (*)    Squamous Epithelial / LPF 0-5 (*)    All other components within normal limits  LIPASE, BLOOD  CBG MONITORING, ED  I-STAT TROPONIN, ED    EKG  EKG Interpretation  Date/Time:  Saturday February 01 2017 12:07:38 EDT Ventricular Rate:  63 PR Interval:    QRS Duration: 96 QT Interval:  384 QTC Calculation: 393 R Axis:   28 Text Interpretation:  Sinus arrhythmia RSR' in V1 or V2, right VCD or RVH Nonspecific T abnormalities, lateral leads Baseline wander in lead(s) V2 V4 Confirmed by Brantley Stage [phone]) on 02/01/2017 12:25:04 PM       Radiology No results found.  Procedures Procedures (including critical care time)  Medications Ordered in ED Medications  ondansetron (ZOFRAN-ODT) disintegrating  tablet 4 mg (4 mg Oral Given 02/01/17 1220)  sodium chloride 0.9 % bolus 1,000 mL (1,000 mLs Intravenous New Bag/Given 02/01/17 1220)     Initial Impression / Assessment and Plan / ED Course  I have reviewed the triage vital signs and the nursing notes.  Pertinent labs & imaging results that were available during my care of the patient were reviewed by me and considered in my medical decision making (see chart for details).     66 year old female who presents with generalized weakness and fatigue. She is well appearing in no acute distress. Afebrile and HD stable. Exam non-focal. Presentation seems c/w likely dehydration given decreased PO intake and fluids since surgery.  Blood work reassuring. No major electrolyte, metabolic derangements, anemia, kidney dysfunction. UA w/o infection. No other sources of infection by history or exam.   Given IVF, and she does feel better. Ambulates in ED. No longer dizzy and only mildly weak. Discussed close PCP follow-up. Strict return and follow-up instructions reviewed. She expressed understanding of all discharge instructions and felt comfortable with the plan of care.   Final Clinical Impressions(s) / ED Diagnoses   Final diagnoses:  Dehydration  Dizziness  Weakness    New Prescriptions New Prescriptions   No medications on file     Forde Dandy, MD 02/01/17 1356

## 2017-02-01 NOTE — ED Notes (Signed)
Pt tolerated ambulation well in the hall. Pt states "her legs still feel a little weak but she is more stable."

## 2017-02-01 NOTE — ED Triage Notes (Addendum)
Patient c/o fatigue, dizzy, and nasueated since having procedure on Tuesday.  Patient also reports having some pain in shoulder but none at this time.

## 2017-02-01 NOTE — Discharge Instructions (Signed)
Please get rest, drink plenty of fluids and eat well.  Please follow-up closely with your primary care doctor for close follow-up. If your oncologist feel that your cervical biopsy is reassuring, please talk to your PCP regarding colonoscopy for further work-up of your decreased appetite and weight loss  Return for worsening symptoms, including fever, confusion, passing out, difficulty walking, or any other symptoms concerning to you

## 2017-02-04 ENCOUNTER — Telehealth: Payer: Self-pay | Admitting: Gynecologic Oncology

## 2017-02-04 NOTE — Telephone Encounter (Signed)
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  My recommendation is for repeat US at 3 month intervals x 2. If it is growing, we would recommend hysterectomy after she has recovered from her MI (>73months) and is able to come off of anti-platelet therapy for a surgery. If it remains asymptomatic and stable in appearance, it can be expectantly managed.  Donaciano Eva, MD

## 2017-02-05 ENCOUNTER — Encounter (HOSPITAL_COMMUNITY): Payer: Medicare Other

## 2017-02-07 ENCOUNTER — Encounter (HOSPITAL_COMMUNITY): Payer: Medicare Other

## 2017-02-10 ENCOUNTER — Encounter: Payer: Self-pay | Admitting: Gynecologic Oncology

## 2017-02-10 ENCOUNTER — Ambulatory Visit: Payer: Medicare Other | Attending: Gynecologic Oncology | Admitting: Gynecologic Oncology

## 2017-02-10 VITALS — BP 98/47 | HR 62 | Temp 98.3°F | Resp 20 | Wt 140.4 lb

## 2017-02-10 DIAGNOSIS — N888 Other specified noninflammatory disorders of cervix uteri: Secondary | ICD-10-CM | POA: Diagnosis not present

## 2017-02-10 DIAGNOSIS — F329 Major depressive disorder, single episode, unspecified: Secondary | ICD-10-CM | POA: Insufficient documentation

## 2017-02-10 DIAGNOSIS — Z87891 Personal history of nicotine dependence: Secondary | ICD-10-CM | POA: Diagnosis not present

## 2017-02-10 DIAGNOSIS — F419 Anxiety disorder, unspecified: Secondary | ICD-10-CM | POA: Insufficient documentation

## 2017-02-10 DIAGNOSIS — Z9049 Acquired absence of other specified parts of digestive tract: Secondary | ICD-10-CM | POA: Insufficient documentation

## 2017-02-10 DIAGNOSIS — Z833 Family history of diabetes mellitus: Secondary | ICD-10-CM | POA: Diagnosis not present

## 2017-02-10 DIAGNOSIS — Z823 Family history of stroke: Secondary | ICD-10-CM | POA: Insufficient documentation

## 2017-02-10 DIAGNOSIS — I2510 Atherosclerotic heart disease of native coronary artery without angina pectoris: Secondary | ICD-10-CM | POA: Diagnosis not present

## 2017-02-10 DIAGNOSIS — Z7982 Long term (current) use of aspirin: Secondary | ICD-10-CM | POA: Insufficient documentation

## 2017-02-10 DIAGNOSIS — F17211 Nicotine dependence, cigarettes, in remission: Secondary | ICD-10-CM

## 2017-02-10 DIAGNOSIS — Z955 Presence of coronary angioplasty implant and graft: Secondary | ICD-10-CM | POA: Diagnosis not present

## 2017-02-10 DIAGNOSIS — I252 Old myocardial infarction: Secondary | ICD-10-CM | POA: Diagnosis not present

## 2017-02-10 DIAGNOSIS — Z79899 Other long term (current) drug therapy: Secondary | ICD-10-CM | POA: Insufficient documentation

## 2017-02-10 DIAGNOSIS — Z8249 Family history of ischemic heart disease and other diseases of the circulatory system: Secondary | ICD-10-CM | POA: Insufficient documentation

## 2017-02-10 DIAGNOSIS — Z9889 Other specified postprocedural states: Secondary | ICD-10-CM | POA: Insufficient documentation

## 2017-02-10 DIAGNOSIS — Z809 Family history of malignant neoplasm, unspecified: Secondary | ICD-10-CM | POA: Diagnosis not present

## 2017-02-10 DIAGNOSIS — D260 Other benign neoplasm of cervix uteri: Secondary | ICD-10-CM

## 2017-02-10 NOTE — Progress Notes (Signed)
Consult Note: Gyn-Onc  Consult was requested by Dr. Dellis Filbert for the evaluation of Carol Ferguson 66 y.o. female  CC:  Chief Complaint  Patient presents with  . cervical mass    Assessment/Plan:  [name]  is a 66 y.o.  year old with a 2cm cervical mass.  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  My recommendation is for repeat US at 3 month intervals x 2. If it is growing, we would recommend hysterectomy after she has recovered from her MI (>61months) and is able to come off of anti-platelet therapy for a surgery. If it remains asymptomatic and stable in appearance, it can be expectantly managed.  I will return her to Dr Dellis Filbert to monitor the lesion with serial US's.  HPI: Carol Ferguson is a very pleasant 66 year old P0 who is seen in consultation at the request of Dr Dellis Filbert for a cervical mass.  The patient has a long history of smoking. Her last pap was in 2012 and was normal.  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 This was treated with a stent in the circumflex artery ([employer], Resolute onyx zotarolimus-eluting stent).  She has excellent cardiac function postprocedure and has resumed normal activities, though had quit smoking June, 2018.  The patient began developing right lower quadrant discomfort in July, 2018 with intermittent sharp shooting pains. She reported this to [name], her PCP who performed a TVUS which showed a uterus measuring 7.2x3.5x4.1cm with a small mass in the lower uterine segment measuring 63mm (likely small fibroid). There appears to be a mass centered in the cervix extending into the lower uterine body measuring 2.2x2x1.9cm. The endometrium measures 64mm. The ovaries were normal.  She denies bleeding.   Biopsy was attempted on 12/18/16 which was unsuccessful due to patient discomfort. Pap was normal with no HR HPV detected.   Interval Hx:  On 01/28/17 she went to the  operating room for cervical dilation under US guidance, cervical biopsy and D&C.  Pathology showed a cellular fibroid (spindle cell lesion consistent with smooth muscle neoplasm, with focal atypia but no necrosis or increased mitotic figures). The D&C showed benign squamous mucosa and benign smooth muscle.  Since surgery she has been doing well.  Current Meds:  Outpatient Encounter Prescriptions as of 02/10/2017  Medication Sig  . acetaminophen (TYLENOL) 500 MG tablet Take 500 mg by mouth every 6 (six) hours as needed.  Marland Kitchen aspirin 81 MG chewable tablet Chew 1 tablet (81 mg total) by mouth daily.  Marland Kitchen atorvastatin (LIPITOR) 10 MG tablet Take 1 tablet (10 mg total) by mouth daily.  Marland Kitchen BRILINTA 90 MG TABS tablet TAKE 1 TABLET BY MOUTH TWICE A DAY  . clonazePAM (KLONOPIN) 0.5 MG tablet Take 0.5 tablets (0.25 mg total) by mouth 2 (two) times daily as needed for anxiety.  Marland Kitchen FLUoxetine (PROZAC) 10 MG capsule Take 1 capsule (10 mg total) by mouth daily.  . nitroGLYCERIN (NITROSTAT) 0.4 MG SL tablet Place 1 tablet (0.4 mg total) under the tongue every 5 (five) minutes x 3 doses as needed for chest pain.  . traZODone (DESYREL) 50 MG tablet Take 0.5-1 tablets (25-50 mg total) by mouth at bedtime as needed for sleep.  . [DISCONTINUED] HYDROcodone-acetaminophen (NORCO/VICODIN) 5-325 MG tablet Take 1 tablet by mouth every 4 (four) hours as needed for moderate pain.  . [DISCONTINUED] ibuprofen (ADVIL,MOTRIN) 600 MG tablet Take 1 tablet (600 mg total) by mouth every 6 (  six) hours as needed. (Patient not taking: Reported on 02/01/2017)   No facility-administered encounter medications on file as of 02/10/2017.     Allergy:  Allergies  Allergen Reactions  . Flagyl [Metronidazole] Nausea Only    DIARRHEA    Social Hx:   Social History   Social History  . Marital status: Divorced    Spouse name: N/A  . Number of children: N/A  . Years of education: N/A   Occupational History  . Not on file.   Social  History Main Topics  . Smoking status: Former Smoker    Packs/day: 1.00    Years: 35.00    Types: Cigarettes    Quit date: 09/26/2016  . Smokeless tobacco: Never Used  . Alcohol use Yes     Comment: occasional  . Drug use: No  . Sexual activity: No   Other Topics Concern  . Not on file   Social History Narrative  . No narrative on file    Past Surgical Hx:  Past Surgical History:  Procedure Laterality Date  . CERVICAL CONIZATION W/BX N/A 01/28/2017   Procedure: CONIZATION CERVIX WITH BIOPSY;  Surgeon: [name], MD;  Location: [facility];  Service: Gynecology;  Laterality: N/A;  . CHOLECYSTECTOMY    . CORONARY STENT INTERVENTION N/A 09/26/2016   Procedure: Coronary Stent Intervention;  Surgeon: Belva Crome, MD;  Location: [facility];  Service: Cardiovascular;  Laterality: N/A;  . DILATION AND CURETTAGE OF UTERUS N/A 01/28/2017   Procedure: DILATATION AND CURETTAGE WITH ULTRASOUND GUIDENCE;  Surgeon: [name], MD;  Location: [facility];  Service: Gynecology;  Laterality: N/A;  . LEFT HEART CATH AND CORONARY ANGIOGRAPHY N/A 09/26/2016   Procedure: Left Heart Cath and Coronary Angiography;  Surgeon: Belva Crome, MD;  Location: [facility];  Service: Cardiovascular;  Laterality: N/A;    Past Medical Hx:  Past Medical History:  Diagnosis Date  . Anxiety   . 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 CAD (coronary artery disease)    a. Inf STEMI/LHC 09/2016 which showed occluded mid LCx treated with PCI, DES; remaining cath details included a proximal to mid LAD lesion of 20% stenosis, and proximal to mid RCA 25% stenosis. EF 55-60%.  . Depression   . Ischemic cardiomyopathy    a. 2D Echo 09/28/16 showed EF 55-60%, probable severe hypokinesis of the entire inferiormyocardium, normal diastolic parameters.  . Myocardial infarction ([place]) 09/26/2016  . Presence of tooth-root and mandibular implants   . Tobacco abuse   . Wears  glasses     Past Gynecological History:  Nulliparous, menopause age 66 No LMP recorded. Patient is postmenopausal.  Family Hx:  Family History  Problem Relation Age of Onset  . Heart attack Brother   . Cancer Brother        MELANOMA  . Heart disease Father   . Stroke Father   . Heart disease Brother   . Diabetes Brother     Review of Systems:  Constitutional  Feels well,    ENT Normal appearing ears and nares bilaterally Skin/Breast  No rash, sores, jaundice, itching, dryness Cardiovascular  No chest pain, shortness of breath, or edema  Pulmonary  No cough or wheeze.  Gastro Intestinal  No nausea, vomitting, or diarrhoea. No bright red blood per rectum, no abdominal pain, change in bowel movement, or constipation.  Genito Urinary  No frequency, urgency, dysuria, no bleeding Musculo Skeletal  No myalgia, arthralgia, joint swelling or pain  Neurologic  No weakness, numbness, change in gait,  Psychology  No depression, anxiety, insomnia.   Vitals:  Blood pressure (!) 98/47, pulse 62, temperature 98.3 F (36.8 C), temperature source Oral, resp. rate 20, weight 140 lb 6.4 oz (63.7 kg), SpO2 100 %.  Physical Exam: WD in NAD Neck  Supple NROM, without any enlargements.  Lymph Node Survey No cervical supraclavicular or inguinal adenopathy Cardiovascular  Pulse normal rate, regularity and rhythm. S1 and S2 normal.  Lungs  Clear to auscultation bilateraly, without wheezes/crackles/rhonchi. Good air movement.  Skin  No rash/lesions/breakdown  Psychiatry  Alert and oriented to person, place, and time  Abdomen  Normoactive bowel sounds, abdomen soft, non-tender and thin without evidence of hernia.  Back No CVA tenderness Genito Urinary  Vulva/vagina: Normal external female genitalia.  No lesions. No discharge or bleeding.  Bladder/urethra:  No lesions or masses, well supported bladder  Vagina: grossly normal  Cervix: Normal appearing, however very firm to palpate.  Overlying mucosa erythematous but without gross lesions.Biopsy sites well healed.  Uterus:  Small, mobile, no parametrial involvement or nodularity.  Adnexa: no palpable masses. Rectal  Good tone, no masses no cul de sac nodularity. No parametrial disease. Extremities  No bilateral cyanosis, clubbing or edema.   Donaciano Eva, MD  02/10/2017, 3:26 PM

## 2017-02-10 NOTE — Patient Instructions (Signed)
Please return to Dr Dellis Filbert in 3 months for a repeat US, and again in 6 months.  Call Dr Serita Grit office with any questions or concerns at [phone].

## 2017-02-12 ENCOUNTER — Telehealth: Payer: Self-pay

## 2017-02-12 ENCOUNTER — Encounter (HOSPITAL_COMMUNITY): Payer: Medicare Other

## 2017-02-12 NOTE — Telephone Encounter (Signed)
Called pt to schedule Medicare Annual Wellness Visit. Patient also needs to reschedule office visit that was scheduled in November.    Josepha Pigg, B.A.  Care Guide [phone]

## 2017-02-14 ENCOUNTER — Encounter (HOSPITAL_COMMUNITY): Payer: Medicare Other

## 2017-02-19 ENCOUNTER — Encounter (HOSPITAL_COMMUNITY): Payer: Medicare Other

## 2017-02-19 NOTE — Telephone Encounter (Signed)
Called pt to schedule Medicare Annual Wellness Visit. Patient would like to discuss with PCP at next visit and then schedule her AWV.    Josepha Pigg, B.A.  Care Guide - [facility]

## 2017-02-21 ENCOUNTER — Encounter (HOSPITAL_COMMUNITY): Payer: Medicare Other

## 2017-02-26 ENCOUNTER — Encounter (HOSPITAL_COMMUNITY): Payer: Medicare Other

## 2017-02-28 ENCOUNTER — Encounter (HOSPITAL_COMMUNITY): Payer: Medicare Other

## 2017-03-05 ENCOUNTER — Encounter (HOSPITAL_COMMUNITY): Payer: Medicare Other

## 2017-03-05 ENCOUNTER — Ambulatory Visit: Payer: Medicare Other | Admitting: Physician Assistant

## 2017-03-06 ENCOUNTER — Encounter: Payer: Self-pay | Admitting: Physician Assistant

## 2017-03-06 ENCOUNTER — Other Ambulatory Visit: Payer: Self-pay

## 2017-03-06 ENCOUNTER — Ambulatory Visit (INDEPENDENT_AMBULATORY_CARE_PROVIDER_SITE_OTHER): Payer: Medicare Other | Admitting: Physician Assistant

## 2017-03-06 DIAGNOSIS — G479 Sleep disorder, unspecified: Secondary | ICD-10-CM | POA: Diagnosis not present

## 2017-03-06 DIAGNOSIS — F410 Panic disorder [episodic paroxysmal anxiety] without agoraphobia: Secondary | ICD-10-CM | POA: Diagnosis not present

## 2017-03-06 DIAGNOSIS — I255 Ischemic cardiomyopathy: Secondary | ICD-10-CM | POA: Diagnosis not present

## 2017-03-06 MED ORDER — TRAZODONE HCL 50 MG PO TABS: 25.0000 mg | ORAL_TABLET | Freq: Every evening | ORAL | 2 refills | Status: DC | PRN

## 2017-03-06 MED ORDER — CLONAZEPAM 0.5 MG PO TABS: 0.2500 mg | ORAL_TABLET | Freq: Two times a day (BID) | ORAL | 0 refills | Status: DC | PRN

## 2017-03-06 NOTE — Progress Notes (Deleted)
   Carol Ferguson  MRN: [id number] DOB: [birth year]  Subjective:  Carol Ferguson is a 66 y.o. female seen in office today for a chief complaint of ***  Review of Systems  Patient Active Problem List   Diagnosis Date Noted  . Diverticulosis 01/10/2017  . Cervical mass 12/27/2016  . CAD (coronary artery disease), native coronary artery 11/27/2016  . Chronic diastolic heart failure ([place]) 09/27/2016  . Tobacco abuse 09/27/2016  . Hyperlipidemia with target LDL less than 70 09/27/2016  . 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 Lumbar degenerative disc disease 06/14/2016    Current Outpatient Medications on File Prior to Visit  Medication Sig Dispense Refill  . acetaminophen (TYLENOL) 500 MG tablet Take 500 mg by mouth every 6 (six) hours as needed.    Marland Kitchen aspirin 81 MG chewable tablet Chew 1 tablet (81 mg total) by mouth daily. 30 tablet 11  . BRILINTA 90 MG TABS tablet TAKE 1 TABLET BY MOUTH TWICE A DAY 60 tablet 10  . clonazePAM (KLONOPIN) 0.5 MG tablet Take 0.5 tablets (0.25 mg total) by mouth 2 (two) times daily as needed for anxiety. 15 tablet 0  . FLUoxetine (PROZAC) 10 MG capsule Take 1 capsule (10 mg total) by mouth daily. 90 capsule 0  . nitroGLYCERIN (NITROSTAT) 0.4 MG SL tablet Place 1 tablet (0.4 mg total) under the tongue every 5 (five) minutes x 3 doses as needed for chest pain. 25 tablet 11  . traZODone (DESYREL) 50 MG tablet Take 0.5-1 tablets (25-50 mg total) by mouth at bedtime as needed for sleep. 30 tablet 1  . atorvastatin (LIPITOR) 10 MG tablet Take 1 tablet (10 mg total) by mouth daily. 90 tablet 3   No current facility-administered medications on file prior to visit.     Allergies  Allergen Reactions  . Flagyl [Metronidazole] Nausea Only    DIARRHEA     Objective:  BP 116/64 (BP Location: Left Arm, Patient Position: Sitting, Cuff Size: Normal)   Pulse 79   Temp 97.8 F (36.6 C) (Oral)   Resp 18   Ht 5' 5.75" (1.67 m)   Wt 136 lb  3.2 oz (61.8 kg)   SpO2 99%   BMI 22.15 kg/m   Physical Exam  Assessment and Plan :  *** There are no diagnoses linked to this encounter.   Tenna Delaine PA-C  [facility] 03/06/2017 8:18 AM

## 2017-03-06 NOTE — Patient Instructions (Addendum)
  I am so happy to see you doing so well!  I want you to continue using trazodone as needed for sleep.  I have also given you a refill for Klonopin to use moments of true panic attack.  In terms of her overall health, let us plan to do a complete physical exam in 3 months after you have your follow-up ultrasound.  In the meantime, please return to see me if any of your concerns or symptoms worsen. Thank you for letting me participate in your health and well being.    IF you received an x-ray today, you will receive an invoice from [facility]. Please contact [facility] at [phone] with questions or concerns regarding your invoice.   IF you received labwork today, you will receive an invoice from [facility]. Please contact [facility] at [phone] with questions or concerns regarding your invoice.   Our billing staff will not be able to assist you with questions regarding bills from these companies.  You will be contacted with the lab results as soon as they are available. The fastest way to get your results is to activate your My Chart account. Instructions are located on the last page of this paperwork. If you have not heard from [place] regarding the results in 2 weeks, please contact this office.

## 2017-03-06 NOTE — Progress Notes (Signed)
Carol Ferguson  MRN: [id number] DOB: [birth year]  Subjective:  Carol Ferguson is a 66 y.o. female seen in office today for a chief complaint of medication refill and to inform me of her biopsy results.  In terms of medication refill, she has been using trazodone to help with sleep and Klonopin as needed for panic attacks.  She is on Prozac for long-term treatment of her anxiety.  At last visit in 01/10/17 she had been taking Klonopin nightly to help with sleep.  However, we discussed that it is not meant to be used as a daily medication.  We therefore added trazodone to help with sleep.  Since her last visit, she has only used 12 of the 15 tablets of Klonopin.  She has been using trazodone most every night but has cut back to doing it as needed and notes that it is working really well.  When she uses it every night she feels a little  "druggy" throughout the day and she does not like that.  She is continued to take her Prozac daily.  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  Per Dr. Serita Grit recommendations, patient needs repeat ultrasound at 86-month intervals x2.  If it is growing they recommend hysterectomy after she has recovered from her MI and is able to come off antiplatelet therapy for surgery.  If it remains asymptomatic and stable, it will be managed expectantly.  She will follow-up with Dr. Dellis Filbert to monitor the lesion with serial ultrasounds.  In terms of overall health, patient notes that this is the first time she has felt back to herself and had some normal days since her MI.  She is optimistic about her health.   Review of Systems  Constitutional: Negative for chills and fever.  Cardiovascular: Negative for chest pain and palpitations.  Psychiatric/Behavioral: Negative for dysphoric mood and suicidal ideas.      Patient Active Problem List   Diagnosis Date Noted  . Diverticulosis 01/10/2017  . Cervical mass 12/27/2016  . CAD  (coronary artery disease), native coronary artery 11/27/2016  . Chronic diastolic heart failure ([facility]) 09/27/2016  . Tobacco abuse 09/27/2016  . Hyperlipidemia with target LDL less than 70 09/27/2016  . 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 Lumbar degenerative disc disease 06/14/2016    Current Outpatient Medications on File Prior to Visit  Medication Sig Dispense Refill  . acetaminophen (TYLENOL) 500 MG tablet Take 500 mg by mouth every 6 (six) hours as needed.    Marland Kitchen aspirin 81 MG chewable tablet Chew 1 tablet (81 mg total) by mouth daily. 30 tablet 11  . BRILINTA 90 MG TABS tablet TAKE 1 TABLET BY MOUTH TWICE A DAY 60 tablet 10  . clonazePAM (KLONOPIN) 0.5 MG tablet Take 0.5 tablets (0.25 mg total) by mouth 2 (two) times daily as needed for anxiety. 15 tablet 0  . FLUoxetine (PROZAC) 10 MG capsule Take 1 capsule (10 mg total) by mouth daily. 90 capsule 0  . nitroGLYCERIN (NITROSTAT) 0.4 MG SL tablet Place 1 tablet (0.4 mg total) under the tongue every 5 (five) minutes x 3 doses as needed for chest pain. 25 tablet 11  . traZODone (DESYREL) 50 MG tablet Take 0.5-1 tablets (25-50 mg total) by mouth at bedtime as needed for sleep. 30 tablet 1  . atorvastatin (LIPITOR) 10 MG tablet Take 1 tablet (10 mg total) by mouth daily. 90 tablet 3   No current  facility-administered medications on file prior to visit.     Allergies  Allergen Reactions  . Flagyl [Metronidazole] Nausea Only    DIARRHEA     Objective:  There were no vitals taken for this visit.  Physical Exam  Constitutional: She is oriented to person, place, and time and well-developed, well-nourished, and in no distress.  HENT:  Head: Normocephalic and atraumatic.  Eyes: Conjunctivae are normal.  Neck: Normal range of motion.  Pulmonary/Chest: Effort normal.  Neurological: She is alert and oriented to person, place, and time. Gait normal.  Skin: Skin is warm and dry.  Psychiatric: Affect normal.  Vitals  reviewed.   Assessment and Plan :  1. Panic attacks Patient has done a very good job of identifying true panic attacks and using Klonopin only for those moments.  Given refill for 15 tablets.  Encouraged to continue using for moments of true panic attacks.   - clonazePAM (KLONOPIN) 0.5 MG tablet; Take 0.5 tablets (0.25 mg total) 2 (two) times daily as needed by mouth for anxiety.  Dispense: 15 tablet; Refill: 0  2. Sleep disturbance Controlled on trazodone as needed. Encouraged to continue using as she is. Plan for follow up in 3 months after she has her repeat ultrasound for a complete physical exam. - traZODone (DESYREL) 50 MG tablet; Take 0.5-1 tablets (25-50 mg total) at bedtime as needed by mouth for sleep.  Dispense: 30 tablet; Refill: 2  A total of 15 was spent in the room with the patient, greater than 50% of which was in counseling/coordination of care regarding anxiety, sleep disturbance, and cervical biopsy results.  Tenna Delaine PA-C  [facility] 03/06/2017 8:15 AM

## 2017-03-07 ENCOUNTER — Encounter (HOSPITAL_COMMUNITY): Payer: Medicare Other

## 2017-03-12 ENCOUNTER — Encounter (HOSPITAL_COMMUNITY): Payer: Medicare Other

## 2017-03-14 ENCOUNTER — Encounter (HOSPITAL_COMMUNITY): Payer: Medicare Other

## 2017-03-19 ENCOUNTER — Encounter (HOSPITAL_COMMUNITY): Payer: Medicare Other

## 2017-03-21 ENCOUNTER — Emergency Department (HOSPITAL_COMMUNITY)
Admission: EM | Admit: 2017-03-21 | Discharge: 2017-03-22 | Disposition: A | Discharge status: Home / Self Care | Payer: Medicare Other | Attending: Emergency Medicine | Admitting: Emergency Medicine

## 2017-03-21 ENCOUNTER — Encounter (HOSPITAL_COMMUNITY): Payer: Self-pay | Admitting: Emergency Medicine

## 2017-03-21 ENCOUNTER — Other Ambulatory Visit: Payer: Self-pay

## 2017-03-21 DIAGNOSIS — I2510 Atherosclerotic heart disease of native coronary artery without angina pectoris: Secondary | ICD-10-CM | POA: Insufficient documentation

## 2017-03-21 DIAGNOSIS — R112 Nausea with vomiting, unspecified: Secondary | ICD-10-CM | POA: Diagnosis not present

## 2017-03-21 DIAGNOSIS — R109 Unspecified abdominal pain: Secondary | ICD-10-CM | POA: Diagnosis not present

## 2017-03-21 DIAGNOSIS — Z87891 Personal history of nicotine dependence: Secondary | ICD-10-CM | POA: Insufficient documentation

## 2017-03-21 DIAGNOSIS — Z79899 Other long term (current) drug therapy: Secondary | ICD-10-CM | POA: Diagnosis not present

## 2017-03-21 DIAGNOSIS — R1013 Epigastric pain: Secondary | ICD-10-CM | POA: Diagnosis not present

## 2017-03-21 DIAGNOSIS — Z7982 Long term (current) use of aspirin: Secondary | ICD-10-CM | POA: Insufficient documentation

## 2017-03-21 DIAGNOSIS — K5730 Diverticulosis of large intestine without perforation or abscess without bleeding: Secondary | ICD-10-CM | POA: Diagnosis not present

## 2017-03-21 DIAGNOSIS — K529 Noninfective gastroenteritis and colitis, unspecified: Secondary | ICD-10-CM | POA: Diagnosis not present

## 2017-03-21 DIAGNOSIS — I5032 Chronic diastolic (congestive) heart failure: Secondary | ICD-10-CM | POA: Diagnosis not present

## 2017-03-21 MED ORDER — FENTANYL CITRATE (PF) 100 MCG/2ML IJ SOLN
50.0000 ug | Freq: Once | INTRAMUSCULAR | Status: AC
  Administered 2017-03-22 (×2): 50 ug via INTRAVENOUS
  Filled 2017-03-21: qty 2

## 2017-03-21 MED ORDER — SODIUM CHLORIDE 0.9 % IV BOLUS (SEPSIS)
1000.0000 mL | Freq: Once | INTRAVENOUS | Status: AC
  Administered 2017-03-22: 1000 mL via INTRAVENOUS

## 2017-03-21 MED ORDER — SODIUM CHLORIDE 0.9 % IV BOLUS (SEPSIS)
500.0000 mL | Freq: Once | INTRAVENOUS | Status: AC
  Administered 2017-03-22: 500 mL via INTRAVENOUS

## 2017-03-21 MED ORDER — ONDANSETRON HCL 4 MG/2ML IJ SOLN
4.0000 mg | Freq: Once | INTRAMUSCULAR | Status: AC
  Administered 2017-03-22: 4 mg via INTRAVENOUS
  Filled 2017-03-21: qty 2

## 2017-03-21 NOTE — ED Triage Notes (Addendum)
Pt c/o abd pain onset 5 pm today also reports same  Yesterday as well and it feels like gas pain. Took priloisec this am and pain was  resolved until 5 pm today. 12 ekg done  afib rate 85 bpm  and pt report hx of left circumflex occuliosion VS BP: 117/71` HR: 80 Resp: 22 O2 Sat: 100

## 2017-03-22 ENCOUNTER — Encounter (HOSPITAL_COMMUNITY): Payer: Self-pay | Admitting: Radiology

## 2017-03-22 ENCOUNTER — Emergency Department (HOSPITAL_COMMUNITY): Payer: Medicare Other

## 2017-03-22 DIAGNOSIS — K5730 Diverticulosis of large intestine without perforation or abscess without bleeding: Secondary | ICD-10-CM | POA: Diagnosis not present

## 2017-03-22 DIAGNOSIS — K529 Noninfective gastroenteritis and colitis, unspecified: Secondary | ICD-10-CM | POA: Diagnosis not present

## 2017-03-22 LAB — CBC WITH DIFFERENTIAL/PLATELET
BASOS ABS: 0 10*3/uL (ref 0.0–0.1)
BASOS PCT: 0 %
EOS ABS: 0 10*3/uL (ref 0.0–0.7)
EOS PCT: 0 %
HCT: 43.1 % (ref 36.0–46.0)
Hemoglobin: 15.2 g/dL — ABNORMAL HIGH (ref 12.0–15.0)
LYMPHS PCT: 7 %
Lymphs Abs: 1 10*3/uL (ref 0.7–4.0)
MCH: 34.5 pg — ABNORMAL HIGH (ref 26.0–34.0)
MCHC: 35.3 g/dL (ref 30.0–36.0)
MCV: 98 fL (ref 78.0–100.0)
MONO ABS: 0.8 10*3/uL (ref 0.1–1.0)
MONOS PCT: 6 %
NEUTROS ABS: 11.9 10*3/uL — AB (ref 1.7–7.7)
NEUTROS PCT: 87 %
PLATELETS: 232 10*3/uL (ref 150–400)
RBC: 4.4 MIL/uL (ref 3.87–5.11)
RDW: 12.6 % (ref 11.5–15.5)
WBC: 13.7 10*3/uL — AB (ref 4.0–10.5)

## 2017-03-22 LAB — COMPREHENSIVE METABOLIC PANEL
ALK PHOS: 100 U/L (ref 38–126)
ALT: 16 U/L (ref 14–54)
ANION GAP: 10 (ref 5–15)
AST: 18 U/L (ref 15–41)
Albumin: 4 g/dL (ref 3.5–5.0)
BILIRUBIN TOTAL: 1 mg/dL (ref 0.3–1.2)
BUN: 20 mg/dL (ref 6–20)
CHLORIDE: 106 mmol/L (ref 101–111)
CO2: 25 mmol/L (ref 22–32)
Calcium: 9.9 mg/dL (ref 8.9–10.3)
Creatinine, Ser: 0.81 mg/dL (ref 0.44–1.00)
Glucose, Bld: 129 mg/dL — ABNORMAL HIGH (ref 65–99)
POTASSIUM: 3.2 mmol/L — AB (ref 3.5–5.1)
Sodium: 141 mmol/L (ref 135–145)
Total Protein: 7.3 g/dL (ref 6.5–8.1)

## 2017-03-22 LAB — LIPASE, BLOOD: Lipase: 26 U/L (ref 11–51)

## 2017-03-22 LAB — I-STAT TROPONIN, ED: TROPONIN I, POC: 0 ng/mL (ref 0.00–0.08)

## 2017-03-22 MED ORDER — FENTANYL CITRATE (PF) 100 MCG/2ML IJ SOLN
INTRAMUSCULAR | Status: AC
  Administered 2017-03-22: 50 ug via INTRAVENOUS
  Filled 2017-03-22: qty 2

## 2017-03-22 MED ORDER — HYDROCODONE-ACETAMINOPHEN 5-325 MG PO TABS: 1.0000 | ORAL_TABLET | Freq: Four times a day (QID) | ORAL | 0 refills | Status: DC | PRN

## 2017-03-22 MED ORDER — IOPAMIDOL (ISOVUE-300) INJECTION 61%
100.0000 mL | Freq: Once | INTRAVENOUS | Status: AC | PRN
  Administered 2017-03-22: 100 mL via INTRAVENOUS

## 2017-03-22 MED ORDER — ONDANSETRON HCL 4 MG PO TABS: 4.0000 mg | ORAL_TABLET | Freq: Three times a day (TID) | ORAL | 0 refills | Status: DC | PRN

## 2017-03-22 MED ORDER — ONDANSETRON HCL 4 MG/2ML IJ SOLN
INTRAMUSCULAR | Status: AC
  Filled 2017-03-22: qty 2

## 2017-03-22 MED ORDER — SODIUM CHLORIDE 0.9 % IV SOLN
3.0000 g | Freq: Once | INTRAVENOUS | Status: AC
  Administered 2017-03-22: 3 g via INTRAVENOUS
  Filled 2017-03-22: qty 3

## 2017-03-22 MED ORDER — AMOXICILLIN-POT CLAVULANATE 875-125 MG PO TABS: 1.0000 | ORAL_TABLET | Freq: Two times a day (BID) | ORAL | 0 refills | Status: DC

## 2017-03-22 MED ORDER — IOPAMIDOL (ISOVUE-300) INJECTION 61%
INTRAVENOUS | Status: AC
  Filled 2017-03-22: qty 100

## 2017-03-22 MED ORDER — DICYCLOMINE HCL 20 MG PO TABS: 20.0000 mg | ORAL_TABLET | Freq: Three times a day (TID) | ORAL | 0 refills | Status: DC

## 2017-03-22 NOTE — ED Provider Notes (Signed)
[facility] DEPT Provider Note   CSN: [id number] Arrival date & time: 03/21/17  2222  Time seen 23:20 PM   History   Chief Complaint Chief Complaint  Patient presents with  . Abdominal Pain    HPI Carol Ferguson is a 66 y.o. female.  HPI   patient reports yesterday afternoon for about 3 hours she had a sharp pressure-like pain that shoots up and down her abdomen and seems to rest in the epigastric area.  She also states her ribs feel sore.  She states it restarted about 4 PM today and states it gets better and worse, she states right now it is better but it is aching.  She has had nausea and vomited once in the ED if very small amount of liquid.  She denies diarrhea or constipation.  She states she had a MI in June and had a stent placed in her circumflex.  Shortly after that she was diagnosed with a spindle cell fibroid and had a D&C done on October 9.  She states concern was that it was cancer and they recommend she gets ultrasound every 3 months.  At that time she had had sharp lower abdominal pains.  She states she has had a decreased appetite since her MI and has lost 30 pounds.  She states sometimes the pain does radiate into her back.  She is status post cholecystectomy.  She states laying on her side makes the pain worse, pushing on her abdomen makes it feel better.  She denies any abdominal bloating.  She states she has been having abdominal problems for the past several months and she started taking Prilosec 3 weeks ago which she feels has helped.  She has not seen a gastroenterologist yet and is never had endoscopy or colonoscopy.  PCP [name], PA-C   Past Medical History:  Diagnosis Date  . Anxiety   . 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 CAD (coronary artery disease)    a. Inf STEMI/LHC 09/2016 which showed occluded mid LCx treated with PCI, DES; remaining cath details included a proximal to mid LAD lesion of 20% stenosis,  and proximal to mid RCA 25% stenosis. EF 55-60%.  . Depression   . Ischemic cardiomyopathy    a. 2D Echo 09/28/16 showed EF 55-60%, probable severe hypokinesis of the entire inferiormyocardium, normal diastolic parameters.  . Myocardial infarction ([place]) 09/26/2016  . Presence of tooth-root and mandibular implants   . Tobacco abuse   . Wears glasses     Patient Active Problem List   Diagnosis Date Noted  . Diverticulosis 01/10/2017  . Cervical mass 12/27/2016  . CAD (coronary artery disease), native coronary artery 11/27/2016  . Chronic diastolic heart failure ([place]) 09/27/2016  . Tobacco abuse 09/27/2016  . Hyperlipidemia with target LDL less than 70 09/27/2016  . 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 Lumbar degenerative disc disease 06/14/2016    Past Surgical History:  Procedure Laterality Date  . CERVICAL CONIZATION W/BX N/A 01/28/2017   Procedure: CONIZATION CERVIX WITH BIOPSY;  Surgeon: [name], MD;  Location: [facility];  Service: Gynecology;  Laterality: N/A;  . CHOLECYSTECTOMY    . CORONARY STENT INTERVENTION N/A 09/26/2016   Procedure: Coronary Stent Intervention;  Surgeon: Belva Crome, MD;  Location: [facility];  Service: Cardiovascular;  Laterality: N/A;  . DILATION AND CURETTAGE OF UTERUS N/A 01/28/2017   Procedure: DILATATION AND  CURETTAGE WITH ULTRASOUND GUIDENCE;  Surgeon: [name], MD;  Location: [facility];  Service: Gynecology;  Laterality: N/A;  . LEFT HEART CATH AND CORONARY ANGIOGRAPHY N/A 09/26/2016   Procedure: Left Heart Cath and Coronary Angiography;  Surgeon: Belva Crome, MD;  Location: [facility];  Service: Cardiovascular;  Laterality: N/A;    OB History    Gravida Para Term Preterm AB Living   0 0 0 0 0 0   SAB TAB Ectopic Multiple Live Births   0 0 0 0 0       Home Medications    Prior to Admission medications   Medication Sig Start Date End Date Taking? Authorizing  Provider  acetaminophen (TYLENOL) 500 MG tablet Take 500 mg by mouth every 6 (six) hours as needed for mild pain, moderate pain or headache.    Yes [provider]  aspirin 81 MG chewable tablet Chew 1 tablet (81 mg total) by mouth daily. 09/28/16  Yes Dunn, Areta Haber, PA-C  atorvastatin (LIPITOR) 10 MG tablet Take 1 tablet (10 mg total) by mouth daily. 12/02/16 03/22/17 Yes Belva Crome, MD  BRILINTA 90 MG TABS tablet TAKE 1 TABLET BY MOUTH TWICE A DAY 10/25/16  Yes Dunn, Dayna N, PA-C  clonazePAM (KLONOPIN) 0.5 MG tablet Take 0.5 tablets (0.25 mg total) 2 (two) times daily as needed by mouth for anxiety. 03/06/17  Yes Tenna Delaine D, PA-C  FLUoxetine (PROZAC) 10 MG capsule Take 1 capsule (10 mg total) by mouth daily. 12/03/16  Yes Timmothy Euler, [place] D, PA-C  nitroGLYCERIN (NITROSTAT) 0.4 MG SL tablet Place 1 tablet (0.4 mg total) under the tongue every 5 (five) minutes x 3 doses as needed for chest pain. 09/28/16  Yes Dunn, Areta Haber, PA-C  omeprazole (PRILOSEC) 20 MG capsule Take 20 mg by mouth daily.   Yes [provider]  traZODone (DESYREL) 50 MG tablet Take 0.5-1 tablets (25-50 mg total) at bedtime as needed by mouth for sleep. 03/06/17  Yes Tenna Delaine D, PA-C  amoxicillin-clavulanate (AUGMENTIN) 875-125 MG tablet Take 1 tablet by mouth every 12 (twelve) hours. 03/22/17   Rolland Porter, MD  dicyclomine (BENTYL) 20 MG tablet Take 1 tablet (20 mg total) by mouth 4 (four) times daily -  before meals and at bedtime. 03/22/17   Rolland Porter, MD  HYDROcodone-acetaminophen (NORCO/VICODIN) 5-325 MG tablet Take 1 tablet by mouth every 6 (six) hours as needed for moderate pain or severe pain. 03/22/17   Rolland Porter, MD  ondansetron (ZOFRAN) 4 MG tablet Take 1 tablet (4 mg total) by mouth every 8 (eight) hours as needed for nausea or vomiting. 03/22/17   Rolland Porter, MD    Family History Family History  Problem Relation Age of Onset  . Heart attack Brother   . Cancer Brother        MELANOMA  .  Heart disease Father   . Stroke Father   . Heart disease Brother   . Diabetes Brother     Social History Social History   Tobacco Use  . Smoking status: Former Smoker    Packs/day: 1.00    Years: 35.00    Pack years: 35.00    Types: Cigarettes    Last attempt to quit: 09/26/2016    Years since quitting: 0.4  . Smokeless tobacco: Never Used  Substance Use Topics  . Alcohol use: Yes    Comment: occasional  . Drug use: No  retired, works Warehouse manager Quit smoking in June  after her MI   Allergies   Flagyl [metronidazole]   Review of Systems Review of Systems  All other systems reviewed and are negative.    Physical Exam Updated Vital Signs BP 112/72 (BP Location: Left Arm)   Pulse 65   Temp 97.7 F (36.5 C) (Oral)   Resp 19   SpO2 98%   Vital signs normal    Physical Exam  Constitutional: She is oriented to person, place, and time. She appears well-developed and well-nourished.  Non-toxic appearance. She does not appear ill. No distress.  HENT:  Head: Normocephalic and atraumatic.  Right Ear: External ear normal.  Left Ear: External ear normal.  Nose: Nose normal. No mucosal edema or rhinorrhea.  Mouth/Throat: Oropharynx is clear and moist and mucous membranes are normal. No dental abscesses or uvula swelling.  Eyes: Conjunctivae and EOM are normal. Pupils are equal, round, and reactive to light.  Neck: Normal range of motion and full passive range of motion without pain. Neck supple.  Cardiovascular: Normal rate, regular rhythm and normal heart sounds. Exam reveals no gallop and no friction rub.  No murmur heard. Pulmonary/Chest: Effort normal and breath sounds normal. No respiratory distress. She has no wheezes. She has no rhonchi. She has no rales. She exhibits no tenderness and no crepitus.  Abdominal: Soft. Normal appearance and bowel sounds are normal. She exhibits no distension. There is generalized tenderness. There is no rebound and no guarding.    Musculoskeletal: Normal range of motion. She exhibits no edema or tenderness.  Moves all extremities well.   Neurological: She is alert and oriented to person, place, and time. She has normal strength. No cranial nerve deficit.  Skin: Skin is warm, dry and intact. No rash noted. No erythema. There is pallor.  Psychiatric: She has a normal mood and affect. Her speech is normal and behavior is normal. Her mood appears not anxious.  Nursing note and vitals reviewed.    ED Treatments / Results  Labs (all labs ordered are listed, but only abnormal results are displayed) Results for orders placed or performed during the hospital encounter of 03/21/17  Lipase, blood  Result Value Ref Range   Lipase 26 11 - 51 U/L  Comprehensive metabolic panel  Result Value Ref Range   Sodium 141 135 - 145 mmol/L   Potassium 3.2 (L) 3.5 - 5.1 mmol/L   Chloride 106 101 - 111 mmol/L   CO2 25 22 - 32 mmol/L   Glucose, Bld 129 (H) 65 - 99 mg/dL   BUN 20 6 - 20 mg/dL   Creatinine, Ser 0.81 0.44 - 1.00 mg/dL   Calcium 9.9 8.9 - 10.3 mg/dL   Total Protein 7.3 6.5 - 8.1 g/dL   Albumin 4.0 3.5 - 5.0 g/dL   AST 18 15 - 41 U/L   ALT 16 14 - 54 U/L   Alkaline Phosphatase 100 38 - 126 U/L   Total Bilirubin 1.0 0.3 - 1.2 mg/dL   GFR calc non Af Amer >60 >60 mL/min   GFR calc Af Amer >60 >60 mL/min   Anion gap 10 5 - 15  CBC with Differential  Result Value Ref Range   WBC 13.7 (H) 4.0 - 10.5 K/uL   RBC 4.40 3.87 - 5.11 MIL/uL   Hemoglobin 15.2 (H) 12.0 - 15.0 g/dL   HCT 43.1 36.0 - 46.0 %   MCV 98.0 78.0 - 100.0 fL   MCH 34.5 (H) 26.0 - 34.0 pg   MCHC 35.3 30.0 -  36.0 g/dL   RDW 12.6 11.5 - 15.5 %   Platelets 232 150 - 400 K/uL   Neutrophils Relative % 87 %   Neutro Abs 11.9 (H) 1.7 - 7.7 K/uL   Lymphocytes Relative 7 %   Lymphs Abs 1.0 0.7 - 4.0 K/uL   Monocytes Relative 6 %   Monocytes Absolute 0.8 0.1 - 1.0 K/uL   Eosinophils Relative 0 %   Eosinophils Absolute 0.0 0.0 - 0.7 K/uL   Basophils  Relative 0 %   Basophils Absolute 0.0 0.0 - 0.1 K/uL  I-stat troponin, ED  Result Value Ref Range   Troponin i, poc 0.00 0.00 - 0.08 ng/mL   Comment 3           Laboratory interpretation all normal except mild hypokalemia, leukocytosis, elevation of hemoglobin consistent with dehydration    EKG  EKG Interpretation  Date/Time:  Friday March 21 2017 22:51:54 EST Ventricular Rate:  74 PR Interval:    QRS Duration: 121 QT Interval:  398 QTC Calculation: 442 R Axis:   45 Text Interpretation:  Sinus rhythm Atrial premature complex Short PR interval Nonspecific intraventricular conduction delay Borderline T abnormalities, lateral leads No significant change since last tracing 01 Feb 2017 Confirmed by Rolland Porter [id number]) on 03/22/2017 12:10:36 AM       Radiology Ct Abdomen Pelvis W Contrast  Result Date: 03/22/2017 CLINICAL DATA:  Abdominal pain. EXAM: CT ABDOMEN AND PELVIS WITH CONTRAST TECHNIQUE: Multidetector CT imaging of the abdomen and pelvis was performed using the standard protocol following bolus administration of intravenous contrast. CONTRAST:  168mL ISOVUE-300 IOPAMIDOL (ISOVUE-300) INJECTION 61% COMPARISON:  Pelvic MRI 01/01/2017, no prior CT FINDINGS: Lower chest: Minimal hypoventilatory atelectasis. No consolidation or pleural fluid. Hepatobiliary: No focal hepatic lesion. Postcholecystectomy with mild intrahepatic biliary ductal dilatation. Common bile duct is normal in caliber measuring 4 mm. Pancreas: No ductal dilatation or inflammation. Spleen: Normal in size without focal abnormality. Small cleft posteriorly. Adrenals/Urinary Tract: No adrenal nodule. No hydronephrosis or perinephric edema. Homogeneous renal enhancement with symmetric excretion on delayed phase imaging. Subcentimeter cyst in the upper left kidney. Urinary bladder is partially distended without wall thickening. Stomach/Bowel: Small hiatal hernia. Air-fluid level in the stomach. Diffusely prominent  fluid-filled small bowel measuring up to 2.2 cm. This involves nearly all small bowel with some sparing of the proximal jejunum and distal most terminal ileum. No significant bowel wall thickening. Mild associated mesenteric edema and free fluid. No transition point Ascending colon is nondistended with mild submucosal fatty infiltration. Small volume of stool throughout the remainder the colon. Colonic diverticulosis of the descending and sigmoid colon without diverticulitis. The appendix is not confidently visualized, however arm no findings localizing right lower quadrant inflammation. Vascular/Lymphatic: Aortic atherosclerosis without aneurysm. No acute vascular abnormality. No enlarged abdominal or pelvic lymph nodes. Reproductive: Heterogeneous endometrium, patient is post recent D and C. No adnexal mass. Other: No free air or intra-abdominal abscess. Mild diffuse mesenteric edema and small amount of free fluid in the pelvis and right upper quadrant. Mild omental engorgement in the left abdomen. Musculoskeletal: There are no acute or suspicious osseous abnormalities. Hemi transitional lumbosacral anatomy. Vertebral body hemangioma within L1. IMPRESSION: 1. Diffuse prominent fluid-filled small bowel with mesenteric edema, enteritis pattern. This may be infectious or inflammatory. There is no evidence of obstruction. 2. Mild omental stranding in the left abdomen likely reactive secondary to primary bowel process, omental caking is not entirely excluded. 3. Sigmoid colonic diverticulosis without diverticulitis. 4. Prominent heterogeneous endometrium, recommend  correlation with recent D and C. 5.  Aortic Atherosclerosis (ICD10-I70.0). Electronically Signed   By: Jeb Levering M.D.   On: 03/22/2017 02:49    Procedures Procedures (including critical care time)  Medications Ordered in ED Medications  iopamidol (ISOVUE-300) 61 % injection (not administered)  ondansetron (ZOFRAN) 4 MG/2ML injection (not  administered)  Ampicillin-Sulbactam (UNASYN) 3 g in sodium chloride 0.9 % 100 mL IVPB (not administered)  sodium chloride 0.9 % bolus 1,000 mL (1,000 mLs Intravenous New Bag/Given 03/22/17 0024)  sodium chloride 0.9 % bolus 500 mL (500 mLs Intravenous New Bag/Given 03/22/17 0034)  ondansetron (ZOFRAN) injection 4 mg (4 mg Intravenous Given 03/22/17 0024)  fentaNYL (SUBLIMAZE) injection 50 mcg (50 mcg Intravenous Given 03/22/17 0317)  iopamidol (ISOVUE-300) 61 % injection 100 mL (100 mLs Intravenous Contrast Given 03/22/17 0143)     Initial Impression / Assessment and Plan / ED Course  I have reviewed the triage vital signs and the nursing notes.  Pertinent labs & imaging results that were available during my care of the patient were reviewed by me and considered in my medical decision making (see chart for details).     She was given IV fluids and IV pain and nausea medication.  CT scan of the abdomen was ordered to help clarify the etiology of her pain.  Patient's pain and nausea medicine had to be repeated.  3 AM after reviewing her CT scan she was given Unasyn IV, she was not given Cipro and Flagyl because she has a Flagyl allergy.  We discussed her CT results.  Patient is relieved but also bewildered about what is causing her discomfort.  We discussed whether she thought she need to be admitted or she can go home, she wants to see how she feels after she gets her IV antibiotics.  Recheck at 5:30 AM patient has had her antibiotics.  She is sitting up in bed and she looks much improved.  She states she feels much better.  She feels like she can go home.  Review of the [place] shows patient has mainly gotten clonazepam or lorazepam 0.5 mg tablets in doses under 30 from July to November and only 1 prescription for hydrocodone 5 tablets on October 9.  Final Clinical Impressions(s) / ED Diagnoses   Final diagnoses:  Enteritis  Nausea and vomiting, intractability of vomiting  not specified, unspecified vomiting type  Epigastric pain    ED Discharge Orders        Ordered    amoxicillin-clavulanate (AUGMENTIN) 875-125 MG tablet  Every 12 hours     03/22/17 0547    ondansetron (ZOFRAN) 4 MG tablet  Every 8 hours PRN     03/22/17 0547    dicyclomine (BENTYL) 20 MG tablet  3 times daily before meals & bedtime     03/22/17 0547    HYDROcodone-acetaminophen (NORCO/VICODIN) 5-325 MG tablet  Every 6 hours PRN     03/22/17 0547      Plan discharge  Rolland Porter, MD, Barbette Or, MD 03/22/17 [phone]

## 2017-03-22 NOTE — Discharge Instructions (Signed)
Clear liquid diet today and tomorrow. If doing well, Sunday evening start a bland diet. Avoid fried, spicy or greasy foods for the next couple of weeks. Take the medications as prescribed. Return to the ED if you get worse again. Otherwise consider seeing a gastroenterologist for further evaluation of the abdominal problems you have been having the past few months.

## 2017-04-08 ENCOUNTER — Encounter: Payer: Self-pay | Admitting: Gastroenterology

## 2017-04-18 ENCOUNTER — Encounter: Payer: Self-pay | Admitting: Gastroenterology

## 2017-04-18 ENCOUNTER — Telehealth: Payer: Self-pay

## 2017-04-18 ENCOUNTER — Ambulatory Visit (INDEPENDENT_AMBULATORY_CARE_PROVIDER_SITE_OTHER): Payer: Medicare Other | Admitting: Gastroenterology

## 2017-04-18 VITALS — BP 106/64 | HR 78 | Ht 65.0 in | Wt 135.0 lb

## 2017-04-18 DIAGNOSIS — K5900 Constipation, unspecified: Secondary | ICD-10-CM | POA: Diagnosis not present

## 2017-04-18 DIAGNOSIS — R1031 Right lower quadrant pain: Secondary | ICD-10-CM | POA: Diagnosis not present

## 2017-04-18 DIAGNOSIS — G8929 Other chronic pain: Secondary | ICD-10-CM

## 2017-04-18 DIAGNOSIS — K219 Gastro-esophageal reflux disease without esophagitis: Secondary | ICD-10-CM

## 2017-04-18 DIAGNOSIS — R142 Eructation: Secondary | ICD-10-CM | POA: Diagnosis not present

## 2017-04-18 DIAGNOSIS — R1013 Epigastric pain: Secondary | ICD-10-CM | POA: Diagnosis not present

## 2017-04-18 DIAGNOSIS — I255 Ischemic cardiomyopathy: Secondary | ICD-10-CM | POA: Diagnosis not present

## 2017-04-18 MED ORDER — DICYCLOMINE HCL 10 MG PO CAPS: 10.0000 mg | ORAL_CAPSULE | Freq: Three times a day (TID) | ORAL | 0 refills | Status: DC | PRN

## 2017-04-18 MED ORDER — ONDANSETRON HCL 4 MG PO TABS: 4.0000 mg | ORAL_TABLET | Freq: Three times a day (TID) | ORAL | 1 refills | Status: DC | PRN

## 2017-04-18 NOTE — Telephone Encounter (Signed)
   Primary Cardiologist: Dr. Tamala Julian  Chart reviewed as part of pre-operative protocol coverage. Request to hold Brilinta for colonoscopy, however pt with recent inferior STEMI 6 months ago, 09/2016, with DES placement. Ideally, would prefer 12 months of uninterrupted DAPT, if colonoscopy could be delayed.   This will need to be reviewed by her primary cardiologist Dr. Tamala Julian. I will route to him. Clearance pending.    Lyda Jester, PA-C 04/18/2017, 3:48 PM

## 2017-04-18 NOTE — Telephone Encounter (Signed)
Pharmacy protocol does not include antiplatelet medications.  Forwarding to preop pool for clearance

## 2017-04-18 NOTE — Telephone Encounter (Signed)
The patient calls back. She is in agreement and accepts the appointment as scheduled.

## 2017-04-18 NOTE — Telephone Encounter (Signed)
We can hold off the procedure until she is past 12 months from NSTEMI if her symptoms improve and we can do screening colonoscopy later. I will see her back in office in 2 months to reassess her symptoms. Beth, can you please schedule follow up visit in 2 months. Thanks

## 2017-04-18 NOTE — Telephone Encounter (Signed)
RE: Carol Ferguson DOB: 1950-09-16 MRN: [id number]   Dear Tamala Julian,    We have scheduled the above patient for an endoscopic procedure. Our records show that she is on anticoagulation therapy.   Please advise as to how long the patient may come off her therapy of Brillinat prior to the procedure, which is scheduled for 05/01/17.  Please fax back/ or route the completed form to [facility] at [phone].   Sincerely,   Dr. Silverio Decamp

## 2017-04-18 NOTE — Progress Notes (Signed)
Carol Ferguson    [id number]    Nov 22, 1950  Primary Care Physician:Wiseman, Reather Laurence, PA-C  Referring Physician: [name], PA-C [place], [place] [address]  Chief complaint: Abdominal pain  HPI: 66 year old female here for new patient evaluation with complaints of right lower quadrant abdominal pain, bloating, epigastric discomfort, excessive belching and heartburn. She was recently in the ER a month ago with severe abdominal pain, had CT abdomen and pelvis showed normal CBD 4 mm in size, small hiatal hernia, diffuse prominent fluid-filled small bowel with some sparing of proximal jejunum and distal terminal ileum.  No significant bowel wall thickening.  Mild mesenteric edema and free fluid.  Small amount of stool is noted throughout the colon.  Descending and sigmoid diverticulosis without any features of diverticulitis. She is currently taking omeprazole with improvement of heartburn but continues to have retrosternal, epigastric discomfort which is relieved after belching.  Patient says she had severe anxiety post N STEMI and cardiac catheterization with intervention, was having severe panic attacks, decreased appetite, thinks it is related to smoking cessation and also?  Wellbutrin.  Her symptoms are improving in terms of anxiety in the past 4-6 weeks and also has better appetite.  No longer losing weight. Patient has been getting serial pelvic ultrasound and MRI for follow-up of cervical mass, MRI findings not consistent with cervical carcinoma but did notice asymmetrical thickening of posterior cervix with anterior displacement of cervical canal and associated widening and heterogeneity of the endometrial cavity. She has chronic back pain, has sharp pain in the lower abdomen feels like twitching or  Stabbing. Denies any nausea, vomiting, melena or blood per rectum She has daily bowel movements but does not feel she has evacuated completely, feels full all  the time. She never had colonoscopy or any screening for colorectal cancer. No family history of IBD or colon cancer   Outpatient Encounter Medications as of 04/18/2017  Medication Sig  . acetaminophen (TYLENOL) 500 MG tablet Take 500 mg by mouth every 6 (six) hours as needed for mild pain, moderate pain or headache.   Marland Kitchen aspirin 81 MG chewable tablet Chew 1 tablet (81 mg total) by mouth daily.  Marland Kitchen BRILINTA 90 MG TABS tablet TAKE 1 TABLET BY MOUTH TWICE A DAY  . clonazePAM (KLONOPIN) 0.5 MG tablet Take 0.5 tablets (0.25 mg total) 2 (two) times daily as needed by mouth for anxiety.  Marland Kitchen FLUoxetine (PROZAC) 10 MG capsule Take 1 capsule (10 mg total) by mouth daily.  . nitroGLYCERIN (NITROSTAT) 0.4 MG SL tablet Place 1 tablet (0.4 mg total) under the tongue every 5 (five) minutes x 3 doses as needed for chest pain.  Marland Kitchen omeprazole (PRILOSEC) 20 MG capsule Take 20 mg by mouth daily.  . traZODone (DESYREL) 50 MG tablet Take 0.5-1 tablets (25-50 mg total) at bedtime as needed by mouth for sleep.  Marland Kitchen atorvastatin (LIPITOR) 10 MG tablet Take 1 tablet (10 mg total) by mouth daily.  . [DISCONTINUED] amoxicillin-clavulanate (AUGMENTIN) 875-125 MG tablet Take 1 tablet by mouth every 12 (twelve) hours.  . [DISCONTINUED] dicyclomine (BENTYL) 20 MG tablet Take 1 tablet (20 mg total) by mouth 4 (four) times daily -  before meals and at bedtime.  . [DISCONTINUED] HYDROcodone-acetaminophen (NORCO/VICODIN) 5-325 MG tablet Take 1 tablet by mouth every 6 (six) hours as needed for moderate pain or severe pain.  . [DISCONTINUED] ondansetron (ZOFRAN) 4 MG tablet Take 1 tablet (4 mg total)  by mouth every 8 (eight) hours as needed for nausea or vomiting.   No facility-administered encounter medications on file as of 04/18/2017.     Allergies as of 04/18/2017 - Review Complete 04/18/2017  Allergen Reaction Noted  . Flagyl [metronidazole] Nausea Only 12/13/2016    Past Medical History:  Diagnosis Date  . Anxiety   .  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 CAD (coronary artery disease)    a. Inf STEMI/LHC 09/2016 which showed occluded mid LCx treated with PCI, DES; remaining cath details included a proximal to mid LAD lesion of 20% stenosis, and proximal to mid RCA 25% stenosis. EF 55-60%.  . Depression   . Diverticulosis   . Gallstones   . Ischemic cardiomyopathy    a. 2D Echo 09/28/16 showed EF 55-60%, probable severe hypokinesis of the entire inferiormyocardium, normal diastolic parameters.  . Myocardial infarction ([place]) 09/26/2016  . Pneumonia   . Presence of tooth-root and mandibular implants   . Tobacco abuse   . Wears glasses     Past Surgical History:  Procedure Laterality Date  . CERVICAL CONIZATION W/BX N/A 01/28/2017   Procedure: CONIZATION CERVIX WITH BIOPSY;  Surgeon: [name], MD;  Location: [facility];  Service: Gynecology;  Laterality: N/A;  . CHOLECYSTECTOMY    . CORONARY STENT INTERVENTION N/A 09/26/2016   Procedure: Coronary Stent Intervention;  Surgeon: Belva Crome, MD;  Location: [facility];  Service: Cardiovascular;  Laterality: N/A;  . DILATION AND CURETTAGE OF UTERUS N/A 01/28/2017   Procedure: DILATATION AND CURETTAGE WITH ULTRASOUND GUIDENCE;  Surgeon: [name], MD;  Location: [facility];  Service: Gynecology;  Laterality: N/A;  . LEFT HEART CATH AND CORONARY ANGIOGRAPHY N/A 09/26/2016   Procedure: Left Heart Cath and Coronary Angiography;  Surgeon: Belva Crome, MD;  Location: [facility];  Service: Cardiovascular;  Laterality: N/A;    Family History  Problem Relation Age of Onset  . Cancer Brother        MELANOMA  . Heart disease Father   . Stroke Father   . Heart disease Brother   . Diabetes Brother   . Heart attack Brother   . Heart failure Brother     Social History   Socioeconomic History  . Marital status: Divorced    Spouse name: Not on file  . Number of children: 0  . Years of education: Not on  file  . Highest education level: Not on file  Social Needs  . Financial resource strain: Not on file  . Food insecurity - worry: Not on file  . Food insecurity - inability: Not on file  . Transportation needs - medical: Not on file  . Transportation needs - non-medical: Not on file  Occupational History  . Occupation: retired  Tobacco Use  . Smoking status: Former Smoker    Packs/day: 1.00    Years: 35.00    Pack years: 35.00    Types: Cigarettes    Last attempt to quit: 09/26/2016    Years since quitting: 0.5  . Smokeless tobacco: Never Used  Substance and Sexual Activity  . Alcohol use: Yes    Comment: occasional  . Drug use: No  . Sexual activity: No  Other Topics Concern  . Not on file  Social History Narrative  . Not on file      Review of systems: Review of Systems  Constitutional: Negative for fever and chills. Positive for  fatigue  HENT: Negative.   Eyes: Negative for blurred vision.  Respiratory: Negative for cough, shortness of breath and wheezing.   Cardiovascular: Negative for chest pain and palpitations.  Gastrointestinal: as per HPI Genitourinary: Negative for dysuria, urgency, frequency and hematuria.  Musculoskeletal: Negative for myalgias and joint pain. Positive for back pain Skin: Negative for itching and rash.  Neurological: Negative for dizziness, tremors, focal weakness, seizures and loss of consciousness.  Endo/Heme/Allergies: Positive for seasonal allergies.  Psychiatric/Behavioral: Negative for depression, suicidal ideas and hallucinations. Positive for anxiety All other systems reviewed and are negative.   Physical Exam: Vitals:   04/18/17 0930  BP: 106/64  Pulse: 78   Body mass index is 22.47 kg/m. Gen:      No acute distress HEENT:  EOMI, sclera anicteric Neck:     No masses; no thyromegaly Lungs:    Clear to auscultation bilaterally; normal respiratory effort CV:         Regular rate and rhythm; no murmurs Abd:      + bowel  sounds; soft, non-tender; no palpable masses, no distension Ext:    No edema; adequate peripheral perfusion Skin:      Warm and dry; no rash Neuro: alert and oriented x 3 Psych: normal mood and affect  Data Reviewed:  Reviewed labs, radiology imaging, old records and pertinent past GI work up   Assessment and Plan/Recommendations:  66 year old female here with complaints of heartburn, acid reflux, epigastric discomfort, belching, right lower quadrant abdominal pain CT abdomen pelvis March 21, 2017 showed diffuse prominent small bowel with air-fluid level, no obstruction or transition point Heartburn: Continue omeprazole 20 mg daily Discussed antireflux measures and lifestyle modification  Epigastric discomfort and excessive belching likely secondary to aerophagia Advised patient to avoid mouth breathing Okay to use simethicone 1-2 capsules up to 3 times daily as needed  Constipation: She did have stool throughout the colon on CT abdomen and pelvis Will do bowel purge with MiraLAX and Gatorade followed by daily half capful MiraLAX at bedtime  Lower abdominal cramps and bloating: Bentyl 10 mg every 8 hours as needed  Scheduled for colonoscopy to evaluate right lower quadrant abdominal pain and also obtain biopsies from terminal ileum She is also past due for colorectal cancer screening We will discuss with cardiologist Dr. Tamala Julian if okay to hold Brilinta prior to the procedure.  NSTEMI 6 months ago The risks and benefits as well as alternatives of endoscopic procedure(s) have been discussed and reviewed. All questions answered. The patient agrees to proceed.    Damaris Hippo , MD [phone] Mon-Fri 8a-5p [phone] after 5p, weekends, holidays  CC: Tenna Delaine D, PA*

## 2017-04-18 NOTE — Patient Instructions (Addendum)
If you are age 68 or older, your body mass index should be between 23-30. Your Body mass index is 22.47 kg/m. If this is out of the aforementioned range listed, please consider follow up with your Primary Care Provider.  If you are age 65 or younger, your body mass index should be between 19-25. Your Body mass index is 22.47 kg/m. If this is out of the aformentioned range listed, please consider follow up with your Primary Care Provider.   We have sent the following medications to your pharmacy for you to pick up at your convenience:  Zofran  Bentyl  Miralax - for bowel purge. Instructions printed.  You have been given a prep sample of Plenvu for your Colonoscopy.  Gas X - Take 1 capsule three times daily as needed for belching.  You have been given a handout on anti-reflux measures.  We will contact you in 1 week in regards to holding your Brillinta prior to your procedure.  You have been scheduled for a colonoscopy. Please follow written instructions given to you at your visit today.  Please pick up your prep supplies at the pharmacy within the next 1-3 days. If you use inhalers (even only as needed), please bring them with you on the day of your procedure. Your physician has requested that you go to www.startemmi.com and enter the access code given to you at your visit today. This web site gives a general overview about your procedure. However, you should still follow specific instructions given to you by our office regarding your preparation for the procedure.

## 2017-04-18 NOTE — Telephone Encounter (Signed)
DPR on file. Left a detailed message for the patient. Requested she call back. Scheduled a follow up appointment on 06/18/16 at 3 pm. Can move if necessary.

## 2017-04-24 ENCOUNTER — Encounter: Payer: Self-pay | Admitting: *Deleted

## 2017-04-27 ENCOUNTER — Other Ambulatory Visit: Payer: Self-pay | Admitting: Physician Assistant

## 2017-04-27 DIAGNOSIS — F419 Anxiety disorder, unspecified: Principal | ICD-10-CM

## 2017-04-27 DIAGNOSIS — F329 Major depressive disorder, single episode, unspecified: Secondary | ICD-10-CM

## 2017-04-28 NOTE — Telephone Encounter (Signed)
I agree with not interrupting DAPT until October 20, 2017. Will be 12 months post DES in ACS setting (based on current Guidelines)

## 2017-05-01 ENCOUNTER — Encounter: Payer: Medicare Other | Admitting: Gastroenterology

## 2017-05-16 ENCOUNTER — Telehealth: Payer: Self-pay | Admitting: *Deleted

## 2017-05-16 MED ORDER — DICYCLOMINE HCL 10 MG PO CAPS: 10.0000 mg | ORAL_CAPSULE | Freq: Three times a day (TID) | ORAL | 3 refills | Status: DC | PRN

## 2017-05-16 NOTE — Telephone Encounter (Signed)
Med sent to pharmacy per fax pharmacy request

## 2017-05-23 ENCOUNTER — Telehealth: Payer: Self-pay | Admitting: *Deleted

## 2017-05-23 NOTE — Telephone Encounter (Signed)
Per patient request, faxed the last office note to Dr. Assunta Curtis office regarding the follow up with her and the [place] scan.  Also called the office and left a message for [place] with the information regarding the above.

## 2017-05-29 ENCOUNTER — Telehealth: Payer: Self-pay | Admitting: *Deleted

## 2017-05-29 DIAGNOSIS — N888 Other specified noninflammatory disorders of cervix uteri: Secondary | ICD-10-CM

## 2017-05-29 NOTE — Telephone Encounter (Signed)
Patient was referred to [facility], had appointment on 02/10/17 per Dr.Rossi note in epic:  "My recommendation is for repeat US at 3 month intervals x 2. If it is growing, we would recommend hysterectomy after she has recovered from her MI (>32months) and is able to come off of anti-platelet therapy for a surgery. If it remains asymptomatic and stable in appearance, it can be expectantly managed.  I will return her to Dr Dellis Filbert to monitor the lesion with serial US's."  Order placed for pelvic ultrasound, pt will be scheduled.

## 2017-06-04 ENCOUNTER — Telehealth: Payer: Self-pay | Admitting: Gastroenterology

## 2017-06-04 ENCOUNTER — Ambulatory Visit (INDEPENDENT_AMBULATORY_CARE_PROVIDER_SITE_OTHER)
Admission: RE | Admit: 2017-06-04 | Discharge: 2017-06-04 | Disposition: A | Discharge status: Home / Self Care | Payer: Medicare Other | Source: Ambulatory Visit | Attending: Gastroenterology | Admitting: Gastroenterology

## 2017-06-04 ENCOUNTER — Other Ambulatory Visit: Payer: Self-pay

## 2017-06-04 ENCOUNTER — Telehealth: Payer: Self-pay | Admitting: Interventional Cardiology

## 2017-06-04 DIAGNOSIS — R1030 Lower abdominal pain, unspecified: Secondary | ICD-10-CM

## 2017-06-04 NOTE — Telephone Encounter (Signed)
Patient states she is having a lot of abd pain and wants to know what to do till appt on 2.27.19

## 2017-06-04 NOTE — Telephone Encounter (Signed)
Patient agrees to do this. She has taken dicyclomine. That has improved her symptoms.

## 2017-06-04 NOTE — Telephone Encounter (Signed)
Patient states to try her back on her mobile number.

## 2017-06-04 NOTE — Telephone Encounter (Signed)
New message    Patient calling with concerns of stomach issues, dull ache. No CP, No shortness breath. Requesting to speak with nurse

## 2017-06-04 NOTE — Telephone Encounter (Signed)
Patient calls due to continuing symptoms. She is scheduled for follow up on 06/18/17. She stopped her Prilosec about 2 days ago. She also stopped using Gas-X. Last night she experienced "pressure at my sternum" but "felt better after I expelled gas." Today she has felt nauseated and has not eaten anything. She has had "4 or 5 soft" bowel movements. She feels like she needs to have another bowel movement "almost as soon as I get up from the toilet." She has taken medication for nausea. She denies any watery bowel movements or blood with the bowel movement. She has not taken any new medications or recent antibiotics. No new foods. Does not take fiber. She has not taken any Bentyl. Please advise.

## 2017-06-04 NOTE — Telephone Encounter (Signed)
Please order abdominal X-ray 2 view, I want to check if she has increased stool burden. If positive will plan to do a bowel purge.

## 2017-06-04 NOTE — Telephone Encounter (Signed)
Tried to call patient back. No answer. No voicemail pick up.

## 2017-06-04 NOTE — Telephone Encounter (Signed)
Pt states she has been having GI issues for some time now and just wanted to discuss with me to make sure we didn't think it might be heart related.  Pt has sharp pains in abd at times that can radiate into shoulder or into her sides on occasion.  Denies at pain at this time.  Pt had an episode yesterday of chest pressure that relieved after she released a lot of gas.  Denies SOB, lightheadedness, dizziness or swelling.  BP this AM was 119/77, HR 64.  Pt does not feel like any of this is cardiac but wanted to get our thoughts.  Advised pt to contact GI as this did not sound cardiac related.  Pt appreciative for call.

## 2017-06-18 ENCOUNTER — Other Ambulatory Visit (INDEPENDENT_AMBULATORY_CARE_PROVIDER_SITE_OTHER): Payer: Medicare Other

## 2017-06-18 ENCOUNTER — Other Ambulatory Visit: Payer: Self-pay | Admitting: Gastroenterology

## 2017-06-18 ENCOUNTER — Encounter: Payer: Self-pay | Admitting: Gastroenterology

## 2017-06-18 ENCOUNTER — Ambulatory Visit (INDEPENDENT_AMBULATORY_CARE_PROVIDER_SITE_OTHER): Payer: Medicare Other | Admitting: Gastroenterology

## 2017-06-18 VITALS — BP 102/68 | HR 74 | Ht 65.0 in | Wt 131.5 lb

## 2017-06-18 DIAGNOSIS — R1084 Generalized abdominal pain: Secondary | ICD-10-CM

## 2017-06-18 DIAGNOSIS — K5900 Constipation, unspecified: Secondary | ICD-10-CM | POA: Diagnosis not present

## 2017-06-18 DIAGNOSIS — R935 Abnormal findings on diagnostic imaging of other abdominal regions, including retroperitoneum: Secondary | ICD-10-CM

## 2017-06-18 DIAGNOSIS — K219 Gastro-esophageal reflux disease without esophagitis: Secondary | ICD-10-CM | POA: Diagnosis not present

## 2017-06-18 LAB — BASIC METABOLIC PANEL
BUN: 26 mg/dL — ABNORMAL HIGH (ref 6–23)
CALCIUM: 9.5 mg/dL (ref 8.4–10.5)
CHLORIDE: 104 meq/L (ref 96–112)
CO2: 31 meq/L (ref 19–32)
CREATININE: 0.76 mg/dL (ref 0.40–1.20)
GFR: 80.21 mL/min (ref >60.00)
GLUCOSE: 96 mg/dL (ref 70–99)
Potassium: 3.8 mEq/L (ref 3.5–5.1)
Sodium: 140 mEq/L (ref 135–145)

## 2017-06-18 MED ORDER — DICYCLOMINE HCL 10 MG PO CAPS: 10.0000 mg | ORAL_CAPSULE | Freq: Three times a day (TID) | ORAL | 0 refills | Status: DC

## 2017-06-18 NOTE — Patient Instructions (Signed)
You have been scheduled for a CT scan of the abdomen and pelvis at [facility] ([address]---this is in the same building as [facility]).   You are scheduled on March 8th  at 9:30am. You should arrive 15 minutes prior to your appointment time for registration. Please follow the written instructions below on the day of your exam:  WARNING: IF YOU ARE ALLERGIC TO IODINE/X-RAY DYE, PLEASE NOTIFY RADIOLOGY IMMEDIATELY AT [phone]! YOU WILL BE GIVEN A 13 HOUR PREMEDICATION PREP.  1) Do not eat or drink anything after 5:30am (4 hours prior to your test) 2) You have been given 2 bottles of oral contrast to drink. The solution may taste               better if refrigerated, but do NOT add ice or any other liquid to this solution. Shake             well before drinking.    Drink 1 bottle of contrast @ 7:30am (2 hours prior to your exam)  Drink 1 bottle of contrast @ 8:30am (1 hour prior to your exam)  You may take any medications as prescribed with a small amount of water except for the following: Metformin, Glucophage, Glucovance, Avandamet, Riomet, Fortamet, Actoplus Met, Janumet, Glumetza or Metaglip. The above medications must be held the day of the exam AND 48 hours after the exam.  The purpose of you drinking the oral contrast is to aid in the visualization of your intestinal tract. The contrast solution may cause some diarrhea. Before your exam is started, you will be given a small amount of fluid to drink. Depending on your individual set of symptoms, you may also receive an intravenous injection of x-ray contrast/dye. Plan on being at [facility] for 30 minutes or longer, depending on the type of exam you are having performed.  This test typically takes 30-45 minutes to complete.  If you have any questions regarding your exam or if you need to reschedule, you may call the CT department at [phone] between the hours of 8:00 am and 5:00 pm,  Monday-Friday.   Miralax 1/2 capful daily at bedtime  ________________________________________________________________________

## 2017-06-18 NOTE — Progress Notes (Signed)
Carol Ferguson    [id number]    May 16, 1950  Primary Care Physician:Wiseman, Reather Laurence, PA-C  Referring Physician: [name], PA-C [place], [place] [address]  Chief complaint: Abdominal pain  HPI: 67 year old female here for follow-up visit.  Abdominal pain has improved to some extent after she did a bowel purge.  She continues to have sensation of incomplete evacuation after defecation even though she has a bowel movement almost daily.  She has generalized abdominal discomfort and cramping.  She saw gynecologist and had a D&C, cervix biopsy showed spindle cell with focal atypia, negative for malignancy.  She has follow-up pelvic ultrasound in 3 months. She could not do colonoscopy as was recommended uninterrupted Brilinta for 12 months after bone coronary stent placement,will not be able to come off it until June 2019. She reports improvement of retrosternal chest pain with omeprazole and Gas-X as needed, is currently taking omeprazole only as needed. CT abdomen pelvis March 22, 2017 showed diffuse prominent fluid-filled small bowel, mild omental stranding in the left abdomen and omental caking was not entirely excluded.   Outpatient Encounter Medications as of 06/18/2017  Medication Sig  . acetaminophen (TYLENOL) 500 MG tablet Take 500 mg by mouth every 6 (six) hours as needed for mild pain, moderate pain or headache.   Marland Kitchen aspirin 81 MG chewable tablet Chew 1 tablet (81 mg total) by mouth daily.  Marland Kitchen BRILINTA 90 MG TABS tablet TAKE 1 TABLET BY MOUTH TWICE A DAY  . clonazePAM (KLONOPIN) 0.5 MG tablet Take 0.5 tablets (0.25 mg total) 2 (two) times daily as needed by mouth for anxiety.  . dicyclomine (BENTYL) 10 MG capsule Take 1 capsule (10 mg total) by mouth every 8 (eight) hours as needed for spasms. For abdominal discomfort  . FLUoxetine (PROZAC) 10 MG capsule TAKE 1 CAPSULE BY MOUTH EVERY DAY  . nitroGLYCERIN (NITROSTAT) 0.4 MG SL tablet Place 1 tablet  (0.4 mg total) under the tongue every 5 (five) minutes x 3 doses as needed for chest pain.  Marland Kitchen omeprazole (PRILOSEC) 20 MG capsule Take 20 mg by mouth daily.  . ondansetron (ZOFRAN) 4 MG tablet Take 1 tablet (4 mg total) by mouth every 8 (eight) hours as needed for nausea or vomiting.  . traZODone (DESYREL) 50 MG tablet Take 0.5-1 tablets (25-50 mg total) at bedtime as needed by mouth for sleep.  Marland Kitchen atorvastatin (LIPITOR) 10 MG tablet Take 1 tablet (10 mg total) by mouth daily.   No facility-administered encounter medications on file as of 06/18/2017.     Allergies as of 06/18/2017 - Review Complete 06/18/2017  Allergen Reaction Noted  . Flagyl [metronidazole] Nausea Only 12/13/2016    Past Medical History:  Diagnosis Date  . Anxiety   . 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 CAD (coronary artery disease)    a. Inf STEMI/LHC 09/2016 which showed occluded mid LCx treated with PCI, DES; remaining cath details included a proximal to mid LAD lesion of 20% stenosis, and proximal to mid RCA 25% stenosis. EF 55-60%.  . Depression   . Diverticulosis   . Gallstones   . Ischemic cardiomyopathy    a. 2D Echo 09/28/16 showed EF 55-60%, probable severe hypokinesis of the entire inferiormyocardium, normal diastolic parameters.  . Myocardial infarction ([place]) 09/26/2016  . Pneumonia   . Presence of tooth-root and mandibular implants   . Tobacco abuse   . Wears glasses  Past Surgical History:  Procedure Laterality Date  . CERVICAL CONIZATION W/BX N/A 01/28/2017   Procedure: CONIZATION CERVIX WITH BIOPSY;  Surgeon: [name], MD;  Location[facility];  Service: Gynecology;  Laterality: N/A;  . CHOLECYSTECTOMY    . CORONARY STENT INTERVENTION N/A 09/26/2016   Procedure: Coronary Stent Intervention;  Surgeon: Belva Crome, MD;  Location: [facility];  Service: Cardiovascular;  Laterality: N/A;  . DILATION AND CURETTAGE OF UTERUS N/A 01/28/2017   Procedure: DILATATION  AND CURETTAGE WITH ULTRASOUND GUIDENCE;  Surgeon: [name], MD;  Location: [place] [facility];  Service: Gynecology;  Laterality: N/A;  . LEFT HEART CATH AND CORONARY ANGIOGRAPHY N/A 09/26/2016   Procedure: Left Heart Cath and Coronary Angiography;  Surgeon: Belva Crome, MD;  Location: [facility];  Service: Cardiovascular;  Laterality: N/A;    Family History  Problem Relation Age of Onset  . Cancer Brother        MELANOMA  . Heart disease Father   . Stroke Father   . Heart disease Brother   . Diabetes Brother   . Heart attack Brother   . Heart failure Brother     Social History   Socioeconomic History  . Marital status: Divorced    Spouse name: Not on file  . Number of children: 0  . Years of education: Not on file  . Highest education level: Not on file  Social Needs  . Financial resource strain: Not on file  . Food insecurity - worry: Not on file  . Food insecurity - inability: Not on file  . Transportation needs - medical: Not on file  . Transportation needs - non-medical: Not on file  Occupational History  . Occupation: retired  Tobacco Use  . Smoking status: Former Smoker    Packs/day: 1.00    Years: 35.00    Pack years: 35.00    Types: Cigarettes    Last attempt to quit: 09/26/2016    Years since quitting: 0.7  . Smokeless tobacco: Never Used  Substance and Sexual Activity  . Alcohol use: Yes    Comment: occasional  . Drug use: No  . Sexual activity: No  Other Topics Concern  . Not on file  Social History Narrative  . Not on file      Review of systems: Review of Systems  Constitutional: Negative for fever and chills.  HENT: Negative.   Eyes: Negative for blurred vision.  Respiratory: Negative for cough, shortness of breath and wheezing.   Cardiovascular: Negative for chest pain and palpitations.  Gastrointestinal: as per HPI Genitourinary: Negative for dysuria, urgency, frequency and hematuria.  Musculoskeletal: Positive for  myalgias, back pain and joint pain.  Skin: Negative for itching and rash.  Neurological: Negative for dizziness, tremors, focal weakness, seizures and loss of consciousness.  Endo/Heme/Allergies: Negative for seasonal allergies. Positive for easy bruising Psychiatric/Behavioral: Negative for depression, suicidal ideas and hallucinations. Positive for anxiety All other systems reviewed and are negative.   Physical Exam: Vitals:   06/18/17 1523  BP: 102/68  Pulse: 74   Body mass index is 21.88 kg/m. Gen:      No acute distress HEENT:  EOMI, sclera anicteric Neck:     No masses; no thyromegaly Lungs:    Clear to auscultation bilaterally; normal respiratory effort CV:         Regular rate and rhythm; no murmurs Abd:      + bowel sounds; soft, non-tender; no palpable  masses, no distension Ext:    No edema; adequate peripheral perfusion Skin:      Warm and dry; no rash Neuro: alert and oriented x 3 Psych: normal mood and affect  Data Reviewed:  Reviewed labs, radiology imaging, old records and pertinent past GI work up   Assessment and Plan/Recommendations:  67 year old female with history of diabetes, CAD status post coronary stent on Brilinta here with complaints of generalized abdominal discomfort, constipation and GERD CT abdomen pelvis in December 2018 showed diffuse prominent small bowel with dilation and also omental stranding?  Omental caking. Will obtain repeat CT abdomen pelvis with contrast to follow-up and exclude any neoplastic lesion She had endometrial thickness, was evaluated by GYN and had D&C, negative for malignancy and has follow-up in 3 months She never had colonoscopy for colorectal cancer screening but given recent coronary stent placement, is contraindicated to hold Brilinta until June 2019 will reschedule colonoscopy after June 2019 GERD symptoms stable, continue antireflux measures and omeprazole as needed Constipation increase fluid intake and MiraLAX half  capful at bedtime daily  25 minutes was spent face-to-face with the patient. Greater than 50% of the time used for counseling as well as treatment plan and follow-up. She had multiple questions which were answered to her satisfaction  K. Denzil Magnuson , MD [phone] Mon-Fri 8a-5p [phone] after 5p, weekends, holidays  CC: Tenna Delaine D, PA*

## 2017-06-20 ENCOUNTER — Other Ambulatory Visit: Payer: Self-pay | Admitting: Gastroenterology

## 2017-06-24 ENCOUNTER — Telehealth: Payer: Self-pay | Admitting: Interventional Cardiology

## 2017-06-24 NOTE — Telephone Encounter (Signed)
New message     Patient calling with concerns about getting a CT scan  Patient wants to be sure that CT is ok because she has a stent. Please call

## 2017-06-24 NOTE — Telephone Encounter (Signed)
Spoke with Dr. Irish Lack, [place] and he said CT is fine.  Spoke with pt and made her aware.  Pt appreciative for call.

## 2017-06-25 ENCOUNTER — Encounter: Payer: Self-pay | Admitting: Interventional Cardiology

## 2017-06-26 ENCOUNTER — Encounter: Payer: Self-pay | Admitting: Obstetrics & Gynecology

## 2017-06-26 ENCOUNTER — Other Ambulatory Visit: Payer: Self-pay | Admitting: Obstetrics & Gynecology

## 2017-06-26 ENCOUNTER — Ambulatory Visit (INDEPENDENT_AMBULATORY_CARE_PROVIDER_SITE_OTHER): Payer: Medicare Other | Admitting: Obstetrics & Gynecology

## 2017-06-26 ENCOUNTER — Telehealth: Payer: Self-pay | Admitting: *Deleted

## 2017-06-26 ENCOUNTER — Ambulatory Visit (INDEPENDENT_AMBULATORY_CARE_PROVIDER_SITE_OTHER): Payer: Medicare Other

## 2017-06-26 VITALS — BP 118/80

## 2017-06-26 DIAGNOSIS — N9489 Other specified conditions associated with female genital organs and menstrual cycle: Secondary | ICD-10-CM

## 2017-06-26 DIAGNOSIS — R9389 Abnormal findings on diagnostic imaging of other specified body structures: Secondary | ICD-10-CM

## 2017-06-26 DIAGNOSIS — N839 Noninflammatory disorder of ovary, fallopian tube and broad ligament, unspecified: Secondary | ICD-10-CM

## 2017-06-26 DIAGNOSIS — N838 Other noninflammatory disorders of ovary, fallopian tube and broad ligament: Secondary | ICD-10-CM

## 2017-06-26 DIAGNOSIS — N888 Other specified noninflammatory disorders of cervix uteri: Secondary | ICD-10-CM

## 2017-06-26 DIAGNOSIS — N889 Noninflammatory disorder of cervix uteri, unspecified: Secondary | ICD-10-CM | POA: Diagnosis not present

## 2017-06-26 DIAGNOSIS — N949 Unspecified condition associated with female genital organs and menstrual cycle: Secondary | ICD-10-CM

## 2017-06-26 DIAGNOSIS — N83202 Unspecified ovarian cyst, left side: Secondary | ICD-10-CM | POA: Diagnosis not present

## 2017-06-26 NOTE — Telephone Encounter (Signed)
I called cancer center and left a detailed message on voicemail to schedule pt and call me with time and date to relay.

## 2017-06-26 NOTE — Telephone Encounter (Signed)
-----   Message from Princess Bruins, MD sent at 06/26/2017 11:00 AM EST ----- Regarding: Refer back to Dr Denman George Pelvic US today, no cervical mass seen.  Dr Denman George saw her for that problem before.  Now a new finding of a Left complex ovarian mass 7.6 cm by [place] today.  Ca125, CEA, Ova 1 done.  Patient has a CT of pelvis scheduled tomorrow by GI for continued lower abdominal pain.

## 2017-06-26 NOTE — Progress Notes (Signed)
Carol Ferguson June 30, 1950 [id number]        67 y.o.  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  Pap test neg/HPV HR neg on 12/13/2016.   Pelvic US 12/12/2016:  Uterus:  Measurements: 7.2 x 3.5 x 4.1 cm. There is a small mass in the lower uterine segment measuring 11 x 12 x 12 mm, likely a small fibroid.  There also appears to be a mass centered in the cervix, extending into the lower uterine body measuring 2.2 x 1.9 x 2.0 cm.  Endometrium:  Thickness: 12 mm. No focal abnormality visualized.  Right ovary:  Measurements: 2.2 x 1.4 x 1.6 cm. Normal appearance/no adnexal mass.  Left ovary:  Measurements: 2.2 x 1.6 x 1.7 cm. Normal appearance/no adnexal mass.  No abnormal free fluid.  Patient referred to Dr Carol Ferguson who performed an US guided D+C and Cervical Bx on 01/28/2017.  Based on the results, Dr Carol Ferguson A/P was:   [name]  is a 47 y.o.  year old with a 2cm cervical mass. 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 My recommendation is for repeat US at 3 month intervals x 2. If it is growing, we would recommend hysterectomy after she has recovered from her MI (>68months) and is able to come off of anti-platelet therapy for a surgery. If it remains asymptomatic and stable in appearance, it can be expectantly managed. I will return her to Dr Dellis Ferguson to monitor the lesion with serial US's.   OB History  Gravida Para Term Preterm AB Living  0 0 0 0 0 0  SAB TAB Ectopic Multiple Live Births  0 0 0 0 0        Past medical history,surgical history, problem list, medications, allergies, family history and social history were all reviewed and documented in the EPIC chart.   Directed ROS with pertinent positives and negatives documented in the history of  present illness/assessment and plan.  Exam:  Vitals:   06/26/17 1033  BP: 118/80   General appearance:  Normal  Pelvic US today: T/V images.  Anteverted and heterogenous uterus measuring 5.85 x 3.87 x 3.36 cm.  Right subserous fibroid with calcifications measuring 2.7 x 2.6 cm.  Endometrial line appears thickened at 8.1 mm.  Cervix appears normal, no mass seen.  Right ovary not seen.  Left adnexal mass cystic/solid measuring 7.6 x 6.0 x 7.1 cm.  Positive color flow Doppler.  No free fluid in the posterior cul-de-sac.  Abdomen: Soft, mildly tender.  Gynecologic exam: Vulva normal.  Speculum exam: Cervix within normal limits but pressed by surrounding tissue which seems to fill the fornices all around.  Vagina normal.  Normal secretions.  No blood.  Bimanual exam: Feels very hard all around the cervix with the vaginal hand.  Pelvis appears full with an enlarged uterus, decrease in mobility and tender.  Left adnexal mass, non-mobile.  Fullness of right adnexa.   Assessment/Plan:  67 y.o. G0  1. Cervical lesion Not seen on Pelvic US today.  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 Ovarian mass, left New finding of a large Left cystic/solid adnexal mass 7.6 x 6 x 7.1 cm with positive CFD.  High suspicion of  Ovarian Cancer.  Will draw tumor marker today and refer back to Dr Carol Ferguson urgently.  Patient informed of findings and agrees with the plan. - CA 125 - CEA - OVA1 Mail  3. Thickened endometrium Endometrial line at 8.1 mm  Counseling on above issues more than 50% for 25 minutes.  Carol Bruins MD, 10:39 AM 06/26/2017

## 2017-06-27 ENCOUNTER — Ambulatory Visit (INDEPENDENT_AMBULATORY_CARE_PROVIDER_SITE_OTHER)
Admission: RE | Admit: 2017-06-27 | Discharge: 2017-06-27 | Disposition: A | Discharge status: Home / Self Care | Payer: Medicare Other | Source: Ambulatory Visit | Attending: Gastroenterology | Admitting: Gastroenterology

## 2017-06-27 DIAGNOSIS — R1909 Other intra-abdominal and pelvic swelling, mass and lump: Secondary | ICD-10-CM | POA: Diagnosis not present

## 2017-06-27 DIAGNOSIS — N83209 Unspecified ovarian cyst, unspecified side: Secondary | ICD-10-CM | POA: Diagnosis not present

## 2017-06-27 DIAGNOSIS — R1084 Generalized abdominal pain: Secondary | ICD-10-CM | POA: Diagnosis not present

## 2017-06-27 LAB — CA 125: CA 125: 90 U/mL — AB (ref <35)

## 2017-06-27 LAB — CEA: CEA: 4.5 ng/mL — ABNORMAL HIGH

## 2017-06-27 MED ORDER — IOPAMIDOL (ISOVUE-300) INJECTION 61%
100.0000 mL | Freq: Once | INTRAVENOUS | Status: AC | PRN
  Administered 2017-06-27: 100 mL via INTRAVENOUS

## 2017-06-29 ENCOUNTER — Encounter: Payer: Self-pay | Admitting: Obstetrics & Gynecology

## 2017-06-29 NOTE — Patient Instructions (Signed)
1. Cervical lesion Not seen on Pelvic US today.  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 Ovarian mass, left New finding of a large Left cystic/solid adnexal mass 7.6 x 6 x 7.1 cm with positive CFD.  High suspicion of Ovarian Cancer.  Will draw tumor marker today and refer back to Dr Denman George urgently.  Patient informed of findings and agrees with the plan. - CA 125 - CEA - OVA1 Mail  3. Thickened endometrium Endometrial line at 8.1 mm  Carol Ferguson, good seeing you today!  I will inform you of your results as soon as they are available.  You will receive a phone call from my office shortly to refer you back to Dr. Denman George.

## 2017-06-30 ENCOUNTER — Telehealth: Payer: Self-pay | Admitting: Gastroenterology

## 2017-06-30 NOTE — Progress Notes (Signed)
Cardiology Office Note    Date:  07/01/2017   ID:  Carol Ferguson, DOB [birth year], MRN [id number]  PCP:  [name], PA-C  Cardiologist: Sinclair Grooms, MD   Chief Complaint  Patient presents with  . Coronary Artery Disease    History of Present Illness:  Carol Ferguson is a 67 y.o. female with history of tobacco abuse, chronic low back pain and recently diagnosed CAD during acute coronary syndrome 09/2016 treated with DES to circumflex. Recent diagnosis of ovarian CA 06/30/17.  Has history of inferolateral infarction in June.  No recurrent symptoms similar to presentation.  Has been having abdominal discomfort intermittently.  CT scan recently demonstrated left adnexal mass in part probable metastatic CA.  She is not use nitroglycerin, denies shortness of breath, has noted some subcostal pain.   Past Medical History:  Diagnosis Date  . Anxiety   . 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 CAD (coronary artery disease)    a. Inf STEMI/LHC 09/2016 which showed occluded mid LCx treated with PCI, DES; remaining cath details included a proximal to mid LAD lesion of 20% stenosis, and proximal to mid RCA 25% stenosis. EF 55-60%.  . Depression   . Diverticulosis   . Gallstones   . Ischemic cardiomyopathy    a. 2D Echo 09/28/16 showed EF 55-60%, probable severe hypokinesis of the entire inferiormyocardium, normal diastolic parameters.  . Myocardial infarction ([facility]) 09/26/2016  . Pneumonia   . Presence of tooth-root and mandibular implants   . Tobacco abuse   . Wears glasses     Past Surgical History:  Procedure Laterality Date  . CERVICAL CONIZATION W/BX N/A 01/28/2017   Procedure: CONIZATION CERVIX WITH BIOPSY;  Surgeon: [name], MD;  Location: [facility];  Service: Gynecology;  Laterality: N/A;  . CHOLECYSTECTOMY    . CORONARY STENT INTERVENTION N/A 09/26/2016   Procedure: Coronary Stent Intervention;  Surgeon: Belva Crome, MD;  Location: [facility];  Service: Cardiovascular;  Laterality: N/A;  . DILATION AND CURETTAGE OF UTERUS N/A 01/28/2017   Procedure: DILATATION AND CURETTAGE WITH ULTRASOUND GUIDENCE;  Surgeon: [name], MD;  Location: [facility];  Service: Gynecology;  Laterality: N/A;  . LEFT HEART CATH AND CORONARY ANGIOGRAPHY N/A 09/26/2016   Procedure: Left Heart Cath and Coronary Angiography;  Surgeon: Belva Crome, MD;  Location: [facility];  Service: Cardiovascular;  Laterality: N/A;    Current Medications: Outpatient Medications Prior to Visit  Medication Sig Dispense Refill  . acetaminophen (TYLENOL) 500 MG tablet Take 500 mg by mouth every 6 (six) hours as needed for mild pain, moderate pain or headache.     Marland Kitchen aspirin 81 MG chewable tablet Chew 1 tablet (81 mg total) by mouth daily. 30 tablet 11  . BRILINTA 90 MG TABS tablet TAKE 1 TABLET BY MOUTH TWICE A DAY 60 tablet 10  . dicyclomine (BENTYL) 10 MG capsule TAKE 1 CAPSULE BY MOUTH EVERY 8 HOURS AS NEEDED FOR SPASMS. FOR ABDOMINAL DISCOMFORT 90 capsule 5  . FLUoxetine (PROZAC) 10 MG capsule TAKE 1 CAPSULE BY MOUTH EVERY DAY 90 capsule 0  . nitroGLYCERIN (NITROSTAT) 0.4 MG SL tablet Place 1 tablet (0.4 mg total) under the tongue every 5 (five) minutes x 3 doses as needed for chest pain. 25 tablet 11  . omeprazole (PRILOSEC) 20 MG capsule Take 20 mg by mouth daily.    . ondansetron (ZOFRAN) 4  MG tablet Take 1 tablet (4 mg total) by mouth every 8 (eight) hours as needed for nausea or vomiting. 30 tablet 1  . traZODone (DESYREL) 50 MG tablet Take 0.5-1 tablets (25-50 mg total) at bedtime as needed by mouth for sleep. 30 tablet 2  . atorvastatin (LIPITOR) 10 MG tablet Take 1 tablet (10 mg total) by mouth daily. 90 tablet 3   No facility-administered medications prior to visit.      Allergies:   Flagyl [metronidazole]   Social History   Socioeconomic History  . Marital status: Divorced    Spouse name: None  . Number of children:  0  . Years of education: None  . Highest education level: None  Social Needs  . Financial resource strain: None  . Food insecurity - worry: None  . Food insecurity - inability: None  . Transportation needs - medical: None  . Transportation needs - non-medical: None  Occupational History  . Occupation: retired  Tobacco Use  . Smoking status: Former Smoker    Packs/day: 1.00    Years: 35.00    Pack years: 35.00    Types: Cigarettes    Last attempt to quit: 09/26/2016    Years since quitting: 0.7  . Smokeless tobacco: Never Used  Substance and Sexual Activity  . Alcohol use: Yes    Comment: occasional  . Drug use: No  . Sexual activity: No  Other Topics Concern  . None  Social History Narrative  . None     Family History:  The patient's family history includes Cancer in her brother; Diabetes in her brother; Heart attack in her brother; Heart disease in her brother and father; Heart failure in her brother; Stroke in her father.   ROS:   Please see the history of present illness.    Constipation, nausea, vomiting, anxiety, abdominal pain, decreased appetite and weight loss. All other systems reviewed and are negative.   PHYSICAL EXAM:   VS:  BP 128/72   Pulse (!) 105   Ht 5\' 5"  (1.651 m)   Wt 129 lb 12.8 oz (58.9 kg)   BMI 21.60 kg/m    GEN: Well nourished, well developed, in no acute distress  HEENT: normal  Neck: no JVD, carotid bruits, or masses Cardiac: RRR; no murmurs, rubs, or gallops,no edema  Respiratory:  clear to auscultation bilaterally, normal work of breathing GI: soft, nontender, nondistended, + BS MS: no deformity or atrophy  Skin: warm and dry, no rash Neuro:  Alert and Oriented x 3, Strength and sensation are intact Psych: euthymic mood, full affect  Wt Readings from Last 3 Encounters:  07/01/17 129 lb 12.8 oz (58.9 kg)  06/18/17 131 lb 8 oz (59.6 kg)  04/18/17 135 lb (61.2 kg)      Studies/Labs Reviewed:   EKG:  EKG none  Recent  Labs: 09/26/2016: TSH 0.999 03/21/2017: ALT 16; Hemoglobin 15.2; Platelets 232 06/18/2017: BUN 26; Creatinine, Ser 0.76; Potassium 3.8; Sodium 140   Lipid Panel    Component Value Date/Time   CHOL 124 11/28/2016 0849   TRIG 89 11/28/2016 0849   HDL 80 11/28/2016 0849   CHOLHDL 1.6 11/28/2016 0849   CHOLHDL 2.6 09/27/2016 0407   VLDL 20 09/27/2016 0407   LDLCALC 26 11/28/2016 0849    Additional studies/ records that were reviewed today include:  Abdominal CT scan/8/19:  IMPRESSION: 1. New 7.4 cm left adnexal mass with worsening abdominal ascites and omental nodularity, suggesting metastatic ovarian carcinoma. Consider Gyn-Onc consultation.  ASSESSMENT:    1. Coronary artery disease of native artery of native heart with stable angina pectoris ([facility])   2. Hyperlipidemia with target LDL less than 70   3. 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 Chronic diastolic heart failure ([place])   5. Malignant neoplasm of left ovary ([facility])      PLAN:  In order of problems listed above:  1. Current episodes of some costal epigastric discomfort and not ischemic and likely related to problem #5.  If surgery is needed we will freely be able to discontinue Brilinta for up to 7 days prior to surgery without excessive risk of stent thrombosis.  Brilinta will be completely discontinued in June 2019.  Ischemic symptom has been into scapula burning discomfort which she has not had since the MI. 2. Continue statin therapy with LDL target less than 70. 3. No recurrence.  Clean coronaries including widely patent circumflex stent. 4. No evidence of volume overload. 5. In the early stages of evaluation.  Will be available to discuss management strategies relative to cardiac clearance if needed.  From perspective of cardiology she is cleared to undergo any procedure that will be necessary without excessive cardiac risk.  Again Brilinta can be discontinued 5-7 days before any surgery if  needed.    Medication Adjustments/Labs and Tests Ordered: Current medicines are reviewed at length with the patient today.  Concerns regarding medicines are outlined above.  Medication changes, Labs and Tests ordered today are listed in the Patient Instructions below. Patient Instructions  Medication Instructions:  1) DISCONTINUE Brilinta in June  Labwork: None  Testing/Procedures: None  Follow-Up: Your physician wants you to follow-up in: 6 months with Dr. Tamala Julian.  You will receive a reminder letter in the mail two months in advance. If you don't receive a letter, please call our office to schedule the follow-up appointment.   Any Other Special Instructions Will Be Listed Below (If Applicable).     If you need a refill on your cardiac medications before your next appointment, please call your pharmacy.      Signed, Sinclair Grooms, MD  07/01/2017 8:23 AM    [facility] [place], [place], [place]  [address] Phone: [phone]; Fax: [phone]

## 2017-06-30 NOTE — Telephone Encounter (Signed)
Pt scheduled at 10:45am

## 2017-06-30 NOTE — Telephone Encounter (Signed)
Dr Silverio Decamp has spoken with the patient.

## 2017-06-30 NOTE — Telephone Encounter (Signed)
Pt informed

## 2017-06-30 NOTE — Telephone Encounter (Signed)
Pt scheduled on 07/02/17 @ Dr. Denman George office, left message for pt to call.

## 2017-07-01 ENCOUNTER — Encounter: Payer: Self-pay | Admitting: Interventional Cardiology

## 2017-07-01 ENCOUNTER — Ambulatory Visit (INDEPENDENT_AMBULATORY_CARE_PROVIDER_SITE_OTHER): Payer: Medicare Other | Admitting: Interventional Cardiology

## 2017-07-01 VITALS — BP 128/72 | HR 105 | Ht 65.0 in | Wt 129.8 lb

## 2017-07-01 DIAGNOSIS — C562 Malignant neoplasm of left ovary: Secondary | ICD-10-CM | POA: Diagnosis not present

## 2017-07-01 DIAGNOSIS — I5032 Chronic diastolic (congestive) heart failure: Secondary | ICD-10-CM | POA: Diagnosis not present

## 2017-07-01 DIAGNOSIS — I25118 Atherosclerotic heart disease of native coronary artery with other forms of angina pectoris: Secondary | ICD-10-CM | POA: Diagnosis not present

## 2017-07-01 DIAGNOSIS — E785 Hyperlipidemia, unspecified: Secondary | ICD-10-CM | POA: Diagnosis not present

## 2017-07-01 DIAGNOSIS — I252 Old myocardial infarction: Secondary | ICD-10-CM | POA: Diagnosis not present

## 2017-07-01 NOTE — Patient Instructions (Signed)
Medication Instructions:  1) DISCONTINUE Brilinta in June  Labwork: None  Testing/Procedures: None  Follow-Up: Your physician wants you to follow-up in: 6 months with Dr. Tamala Julian.  You will receive a reminder letter in the mail two months in advance. If you don't receive a letter, please call our office to schedule the follow-up appointment.   Any Other Special Instructions Will Be Listed Below (If Applicable).     If you need a refill on your cardiac medications before your next appointment, please call your pharmacy.

## 2017-07-02 ENCOUNTER — Encounter: Payer: Self-pay | Admitting: Gynecologic Oncology

## 2017-07-02 ENCOUNTER — Inpatient Hospital Stay: Payer: Medicare Other | Attending: Gynecologic Oncology | Admitting: Gynecologic Oncology

## 2017-07-02 VITALS — BP 110/66 | HR 82 | Resp 18 | Wt 128.0 lb

## 2017-07-02 DIAGNOSIS — C786 Secondary malignant neoplasm of retroperitoneum and peritoneum: Secondary | ICD-10-CM

## 2017-07-02 DIAGNOSIS — I252 Old myocardial infarction: Secondary | ICD-10-CM | POA: Diagnosis not present

## 2017-07-02 DIAGNOSIS — R634 Abnormal weight loss: Secondary | ICD-10-CM | POA: Insufficient documentation

## 2017-07-02 DIAGNOSIS — Z79899 Other long term (current) drug therapy: Secondary | ICD-10-CM | POA: Insufficient documentation

## 2017-07-02 DIAGNOSIS — N888 Other specified noninflammatory disorders of cervix uteri: Secondary | ICD-10-CM

## 2017-07-02 DIAGNOSIS — C801 Malignant (primary) neoplasm, unspecified: Secondary | ICD-10-CM

## 2017-07-02 DIAGNOSIS — D390 Neoplasm of uncertain behavior of uterus: Secondary | ICD-10-CM

## 2017-07-02 DIAGNOSIS — R971 Elevated cancer antigen 125 [CA 125]: Secondary | ICD-10-CM | POA: Insufficient documentation

## 2017-07-02 DIAGNOSIS — Z87891 Personal history of nicotine dependence: Secondary | ICD-10-CM

## 2017-07-02 DIAGNOSIS — N838 Other noninflammatory disorders of ovary, fallopian tube and broad ligament: Secondary | ICD-10-CM | POA: Insufficient documentation

## 2017-07-02 NOTE — Progress Notes (Signed)
Consult Note: Gyn-Onc  Consult was requested by Dr. Dellis Filbert for the evaluation of Carol Ferguson 67 y.o. female  CC:  Chief Complaint  Patient presents with  . Cervical mass    Assessment/Plan:  Carol Ferguson  is a 67 y.o.  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 Unclear of primary diagnosis.  We will follow-up today's biopsy from the cervix and compare to the omentum and the prior biopsy. 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  Pelvic radiation may play a role. Given the extensive involvement of the cervix and vagina, I do not feel that surgery at this time will play a role.   Biopsy has been ordered. We will facilitate an appointment with Dr Alvy Bimler once pathology is available.   HPI: Carol Ferguson is a very pleasant 67 year old P0 who is seen in consultation at the request of Dr Dellis Filbert for a cervical mass.  The patient has a long history of smoking. Her last pap was in 2012 and was normal.  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 This was treated with a stent in the circumflex artery ([employer], Resolute onyx zotarolimus-eluting stent).  She has excellent cardiac function postprocedure and has resumed normal activities, though had quit smoking June, 2018.  The patient began developing right lower quadrant discomfort in July, 2018 with intermittent sharp shooting pains. She reported this to [name], her PCP who performed a TVUS which showed a uterus measuring 7.2x3.5x4.1cm with a small mass in the lower uterine segment measuring 13mm (likely small fibroid). There appears to be a mass centered in the cervix extending into the lower uterine body measuring 2.2x2x1.9cm. The endometrium measures 34mm. The ovaries were normal.  She denies bleeding.   Biopsy was attempted on  12/18/16 which was unsuccessful due to patient discomfort. Pap was normal with no HR HPV detected.   MRI pelvis 01/01/17 revealed a uterus measuring 6.3 x 3.6 x 4.2 cm. Endometrial cavity appears thickened and irregular measuring up to 15 mm AP diameter. The cervix appears thickened, mainly posteriorly, but there is no T2 intermediate to high signal intensity tissue disrupting the typically low signal intensity fibrous cervical stroma. The cervical canal appears displaced anteriorly by the posterior cervical mass-effect. Postcontrast imaging shows bandlike areas of probable scarring or tethering between the sigmoid colon in the posterior lower uterine segment and cervix. No findings to suggest grossly abnormal parametrial soft tissue. No adnexal mass. Right ovary measures about 2.3 x 2.4 x 1.7 cm. Left ovary measures about 2.6 x 2.2 x 2.1 cm.  On 01/28/17 she went to the operating room for cervical dilation under US guidance, cervical biopsy and D&C. Her cervix was firm but with no gross visible lesions.   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 The D&C showed benign squamous mucosa and benign smooth muscle.  Postoperative she was recommended to have serial US's at 3 monthly intervals to monitor for change to the cervical mass to determine the natural history. If the mass grew, the plan was to resect it with hysterectomy, given that she would be more remote from MI (and able to safely come off brilenta).   Interval Hx:  She continued to have abdominal  pain and was seen in the ER on 03/22/18. CT abdomen/pelvis was performed and demonstrated: Diffuse prominent fluid-filled small bowel with mesenteric edema, enteritis pattern. This may be infectious or inflammatory. There is no evidence of obstruction. Mild omental stranding in the left abdomen likely reactive secondary  to primary bowel process, omental caking is not entirely excluded. Sigmoid colonic diverticulosis without diverticulitis. Prominent heterogeneous endometrium, recommend correlation with recent D and C.  She was seen by gastroenterology. She reported difficulty eating, early satiety, and pains in the abdomen. Given that her symptoms persisted, the gastroenterologist ordered a follow-up CT on 06/27/17 showed Uterus and right adnexal region unremarkable. 7.4 cm mixed solid/cystic left adnexal mass, new since prior exam. There is a small amount of perihepatic and lower peritoneal ascites, slightly increased since prior. Progressive omental nodularity in the left lower quadrant and anterior right mid Abdomen.  She had a scheduled [place] (had been previously scheduled) on 06/26/17 and showed: Anteverted and heterogenous uterus measuring 5.85 x 3.87 x 3.36 cm. Right subserous fibroid with calcifications measuring 2.7 x 2.6 cm. Endometrial line appears thickened at 8.1 mm. Cervix appears normal, no mass seen. Right ovary not seen. Left adnexal mass cystic/solid measuring 7.6 x 6.0 x 7.1 cm. Positive color flow Doppler.No free fluid in the posterior cul-de-sac.  CA 125 was drawn on 06/27/17 and was elevated at 90. CEA was 4.5.  She denies bleeding.   Current Meds:  Outpatient Encounter Medications as of 07/02/2017  Medication Sig  . acetaminophen (TYLENOL) 500 MG tablet Take 500 mg by mouth every 6 (six) hours as needed for mild pain, moderate pain or headache.   Marland Kitchen aspirin 81 MG chewable tablet Chew 1 tablet (81 mg total) by mouth daily.  Marland Kitchen atorvastatin (LIPITOR) 10 MG tablet Take 1 tablet (10 mg total) by mouth daily.  Marland Kitchen BRILINTA 90 MG TABS tablet TAKE 1 TABLET BY MOUTH TWICE A DAY  . FLUoxetine (PROZAC) 10 MG capsule TAKE 1 CAPSULE BY MOUTH EVERY DAY  . ondansetron (ZOFRAN) 4 MG tablet Take 1 tablet (4 mg total) by mouth every 8 (eight) hours as needed for nausea or vomiting.  . traZODone (DESYREL) 50 MG  tablet Take 0.5-1 tablets (25-50 mg total) at bedtime as needed by mouth for sleep.  Marland Kitchen dicyclomine (BENTYL) 10 MG capsule TAKE 1 CAPSULE BY MOUTH EVERY 8 HOURS AS NEEDED FOR SPASMS. FOR ABDOMINAL DISCOMFORT (Patient not taking: Reported on 07/02/2017)  . nitroGLYCERIN (NITROSTAT) 0.4 MG SL tablet Place 1 tablet (0.4 mg total) under the tongue every 5 (five) minutes x 3 doses as needed for chest pain. (Patient not taking: Reported on 07/02/2017)  . omeprazole (PRILOSEC) 20 MG capsule Take 20 mg by mouth daily.   No facility-administered encounter medications on file as of 07/02/2017.     Allergy:  Allergies  Allergen Reactions  . Flagyl [Metronidazole] Nausea Only    DIARRHEA    Social Hx:   Social History   Socioeconomic History  . Marital status: Divorced    Spouse name: Not on file  . Number of children: 0  . Years of education: Not on file  . Highest education level: Not on file  Social Needs  . Financial resource strain: Not on file  . Food insecurity - worry: Not on file  . Food insecurity - inability: Not on file  . Transportation needs - medical: Not on file  . Transportation needs - non-medical: Not on file  Occupational History  . Occupation: retired  Tobacco  Use  . Smoking status: Former Smoker    Packs/day: 1.00    Years: 35.00    Pack years: 35.00    Types: Cigarettes    Last attempt to quit: 09/26/2016    Years since quitting: 0.7  . Smokeless tobacco: Never Used  Substance and Sexual Activity  . Alcohol use: Yes    Comment: occasional  . Drug use: No  . Sexual activity: No  Other Topics Concern  . Not on file  Social History Narrative  . Not on file    Past Surgical Hx:  Past Surgical History:  Procedure Laterality Date  . CERVICAL CONIZATION W/BX N/A 01/28/2017   Procedure: CONIZATION CERVIX WITH BIOPSY;  Surgeon: [name], MD;  Location: [facility];  Service: Gynecology;  Laterality: N/A;  . CHOLECYSTECTOMY    . CORONARY STENT  INTERVENTION N/A 09/26/2016   Procedure: Coronary Stent Intervention;  Surgeon: Belva Crome, MD;  Location: [place] [facility];  Service: Cardiovascular;  Laterality: N/A;  . DILATION AND CURETTAGE OF UTERUS N/A 01/28/2017   Procedure: DILATATION AND CURETTAGE WITH ULTRASOUND GUIDENCE;  Surgeon: [name], MD;  Location: [place];  Service: Gynecology;  Laterality: N/A;  . LEFT HEART CATH AND CORONARY ANGIOGRAPHY N/A 09/26/2016   Procedure: Left Heart Cath and Coronary Angiography;  Surgeon: Belva Crome, MD;  Location: [place] [facility];  Service: Cardiovascular;  Laterality: N/A;    Past Medical Hx:  Past Medical History:  Diagnosis Date  . Anxiety   . 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 CAD (coronary artery disease)    a. Inf STEMI/LHC 09/2016 which showed occluded mid LCx treated with PCI, DES; remaining cath details included a proximal to mid LAD lesion of 20% stenosis, and proximal to mid RCA 25% stenosis. EF 55-60%.  . Depression   . Diverticulosis   . Gallstones   . Ischemic cardiomyopathy    a. 2D Echo 09/28/16 showed EF 55-60%, probable severe hypokinesis of the entire inferiormyocardium, normal diastolic parameters.  . Myocardial infarction ([place]) 09/26/2016  . Pneumonia   . Presence of tooth-root and mandibular implants   . Tobacco abuse   . Wears glasses     Past Gynecological History:  Nulliparous, menopause age 77 No LMP recorded. Patient is postmenopausal.  Family Hx:  Family History  Problem Relation Age of Onset  . Cancer Brother        MELANOMA  . Heart disease Father   . Stroke Father   . Heart disease Brother   . Diabetes Brother   . Heart attack Brother   . Heart failure Brother     Review of Systems:  Constitutional  Feels well,    ENT Normal appearing ears and nares bilaterally Skin/Breast  No rash, sores, jaundice, itching, dryness Cardiovascular  No chest pain, shortness of breath, or edema  Pulmonary  No cough  or wheeze.  Gastro Intestinal  No nausea, vomitting, or diarrhoea. No bright red blood per rectum, no abdominal pain, change in bowel movement, or constipation.  Genito Urinary  No frequency, urgency, dysuria, no bleeding Musculo Skeletal  No myalgia, arthralgia, joint swelling or pain  Neurologic  No weakness, numbness, change in gait,  Psychology  No depression, anxiety, insomnia.   Vitals:  Blood pressure 110/66, pulse 82, resp. rate 18, weight 128 lb (58.1 kg), SpO2 100 %.  Physical Exam: WD in NAD Neck  Supple NROM, without any enlargements.  Lymph Node Survey No cervical supraclavicular or inguinal adenopathy Cardiovascular  Pulse normal rate, regularity and rhythm. S1 and S2 normal.  Lungs  Clear to auscultation bilateraly, without wheezes/crackles/rhonchi. Good air movement.  Skin  No rash/lesions/breakdown  Psychiatry  Alert and oriented to person, place, and time  Abdomen  Slightly distended, slightly tender, dull to percuss, no palpable masses.  Back No CVA tenderness Genito Urinary  Vulva/vagina: Normal external female genitalia.  No lesions. No discharge or bleeding.  Bladder/urethra:  No lesions or masses, well supported bladder  Vagina: The cervical mass appears to be extending along the most proximal 2cm of anterior vaginal wall. The mucosal is grossly normal but there is firmness underlying the mucosa suggestive of vaginal involvement   Cervix: The cervix is much larger than on previous exam. It measures approximately 6cm to palpate and is normal on the mucosal surface (no mucosal tumors) though there is apparent hyperemia and blanching consistent with tumor infiltration of underlying tissue. The cervix is itself replaced by a fibrotic hard mass which is contiguous with the lower uterine segment.   Uterus:  Enlarged, somewhat mobile, contiguous with cervical mass  Adnexa: There is fullness in the left adnexa consistent with the known mass it is difficulty to  characterize completely on exam.  Rectal  Good tone, no masses no cul de sac nodularity. No parametrial disease. No nodularity in the cul de sac or apparent rectal wall involvement.  Extremities  No bilateral cyanosis, clubbing or edema.  PROCEDURE NOTE: CERVICAL BIOPSY Preop Dx: cervical mass Postop Dx: same  Procedure: cervical biopsy Surgeon: Dorann Ou EBL: minimal Complications: none Specimen: cervical biopsy to pathology Procedure: The patient provided verbal consent and a timeout was performed.  The speculum was placed in the vagina and the cervical mass was visualized.  A Kevorkian biopsy forcep was used to take a piece of tissue from the ectocervix.  There was bleeding encountered that was made hemostatic with silver nitrate stick.  The specimen was sent for permanent pathology.  The patient tolerated the procedure well.   Thereasa Solo, MD  07/02/2017, 1:28 PM

## 2017-07-02 NOTE — Patient Instructions (Signed)
Dr Serita Grit office will call you with the results of the cervical biopsy and omental biopsy as these are resulted.  Dr Serita Grit office will schedule you to meet with the oncologist to discuss chemotherapy. This appointment may not be made until after the biopsies are resulted.  Please contact Dr Serita Grit office with questions [phone]).  It is normal to have some spotting after the biopsy.

## 2017-07-03 ENCOUNTER — Other Ambulatory Visit: Payer: Self-pay | Admitting: Gynecologic Oncology

## 2017-07-03 DIAGNOSIS — C786 Secondary malignant neoplasm of retroperitoneum and peritoneum: Secondary | ICD-10-CM

## 2017-07-03 DIAGNOSIS — C801 Malignant (primary) neoplasm, unspecified: Principal | ICD-10-CM

## 2017-07-04 ENCOUNTER — Telehealth: Payer: Self-pay | Admitting: *Deleted

## 2017-07-04 NOTE — Telephone Encounter (Signed)
Called and gave the patient the appt for March 26th at 11:15am

## 2017-07-08 ENCOUNTER — Telehealth: Payer: Self-pay | Admitting: Gynecologic Oncology

## 2017-07-08 ENCOUNTER — Other Ambulatory Visit: Payer: Self-pay | Admitting: Radiology

## 2017-07-08 NOTE — Telephone Encounter (Signed)
Returned call to patient.  Informed her of cervical biopsy results and advised that results need to be reviewed by Dr. Denman George and then she would be contacted with any further recommendations if any.  Advised to proceed with her scheduled biopsy for this Friday. All questions answered.

## 2017-07-09 ENCOUNTER — Telehealth: Payer: Self-pay | Admitting: Gynecologic Oncology

## 2017-07-09 NOTE — Telephone Encounter (Signed)
Returned call to patient.  Discussed Dr. Serita Grit recommendations to move forward with biopsy scheduled for Friday.  Advised she would be contacted with any information.  All questions and concerns addressed.  Advised to call for any needs.

## 2017-07-10 ENCOUNTER — Ambulatory Visit (HOSPITAL_COMMUNITY)
Admission: RE | Admit: 2017-07-10 | Discharge: 2017-07-10 | Disposition: A | Discharge status: Home / Self Care | Payer: Medicare Other | Source: Ambulatory Visit | Attending: Gynecologic Oncology | Admitting: Gynecologic Oncology

## 2017-07-10 ENCOUNTER — Other Ambulatory Visit: Payer: Self-pay | Admitting: Radiology

## 2017-07-11 ENCOUNTER — Encounter (HOSPITAL_COMMUNITY): Payer: Self-pay

## 2017-07-11 ENCOUNTER — Ambulatory Visit (HOSPITAL_COMMUNITY)
Admission: RE | Admit: 2017-07-11 | Discharge: 2017-07-11 | Disposition: A | Discharge status: Home / Self Care | Payer: Medicare Other | Source: Ambulatory Visit | Attending: Gynecologic Oncology | Admitting: Gynecologic Oncology

## 2017-07-11 DIAGNOSIS — Z7982 Long term (current) use of aspirin: Secondary | ICD-10-CM | POA: Insufficient documentation

## 2017-07-11 DIAGNOSIS — I252 Old myocardial infarction: Secondary | ICD-10-CM

## 2017-07-11 DIAGNOSIS — C539 Malignant neoplasm of cervix uteri, unspecified: Secondary | ICD-10-CM

## 2017-07-11 DIAGNOSIS — Z955 Presence of coronary angioplasty implant and graft: Secondary | ICD-10-CM | POA: Insufficient documentation

## 2017-07-11 DIAGNOSIS — F329 Major depressive disorder, single episode, unspecified: Secondary | ICD-10-CM

## 2017-07-11 DIAGNOSIS — Z87891 Personal history of nicotine dependence: Secondary | ICD-10-CM

## 2017-07-11 DIAGNOSIS — I2510 Atherosclerotic heart disease of native coronary artery without angina pectoris: Secondary | ICD-10-CM | POA: Insufficient documentation

## 2017-07-11 DIAGNOSIS — C786 Secondary malignant neoplasm of retroperitoneum and peritoneum: Secondary | ICD-10-CM

## 2017-07-11 DIAGNOSIS — Z79899 Other long term (current) drug therapy: Secondary | ICD-10-CM | POA: Insufficient documentation

## 2017-07-11 DIAGNOSIS — C801 Malignant (primary) neoplasm, unspecified: Principal | ICD-10-CM

## 2017-07-11 DIAGNOSIS — F419 Anxiety disorder, unspecified: Secondary | ICD-10-CM

## 2017-07-11 DIAGNOSIS — Z881 Allergy status to other antibiotic agents status: Secondary | ICD-10-CM

## 2017-07-11 DIAGNOSIS — R188 Other ascites: Secondary | ICD-10-CM

## 2017-07-11 DIAGNOSIS — C481 Malignant neoplasm of specified parts of peritoneum: Secondary | ICD-10-CM | POA: Diagnosis not present

## 2017-07-11 LAB — CBC WITH DIFFERENTIAL/PLATELET
BASOS PCT: 0 %
Basophils Absolute: 0 10*3/uL (ref 0.0–0.1)
EOS ABS: 0.1 10*3/uL (ref 0.0–0.7)
Eosinophils Relative: 1 %
HCT: 42.9 % (ref 36.0–46.0)
HEMOGLOBIN: 14.6 g/dL (ref 12.0–15.0)
Lymphocytes Relative: 28 %
Lymphs Abs: 1.6 10*3/uL (ref 0.7–4.0)
MCH: 33.4 pg (ref 26.0–34.0)
MCHC: 34 g/dL (ref 30.0–36.0)
MCV: 98.2 fL (ref 78.0–100.0)
MONO ABS: 0.6 10*3/uL (ref 0.1–1.0)
MONOS PCT: 10 %
NEUTROS PCT: 61 %
Neutro Abs: 3.5 10*3/uL (ref 1.7–7.7)
PLATELETS: 221 10*3/uL (ref 150–400)
RBC: 4.37 MIL/uL (ref 3.87–5.11)
RDW: 12.8 % (ref 11.5–15.5)
WBC: 5.8 10*3/uL (ref 4.0–10.5)

## 2017-07-11 LAB — PROTIME-INR
INR: 1.12
PROTHROMBIN TIME: 14.3 s (ref 11.4–15.2)

## 2017-07-11 MED ORDER — HYDROCODONE-ACETAMINOPHEN 5-325 MG PO TABS: 1.0000 | ORAL_TABLET | ORAL | Status: DC | PRN

## 2017-07-11 MED ORDER — MIDAZOLAM HCL 2 MG/2ML IJ SOLN
INTRAMUSCULAR | Status: AC
  Filled 2017-07-11: qty 4

## 2017-07-11 MED ORDER — MIDAZOLAM HCL 2 MG/2ML IJ SOLN
INTRAMUSCULAR | Status: AC | PRN
  Administered 2017-07-11 (×2): 1 mg via INTRAVENOUS
  Administered 2017-07-11 (×2): 0.5 mg via INTRAVENOUS
  Administered 2017-07-11: 1 mg via INTRAVENOUS

## 2017-07-11 MED ORDER — FLUMAZENIL 0.5 MG/5ML IV SOLN
INTRAVENOUS | Status: AC
  Filled 2017-07-11: qty 5

## 2017-07-11 MED ORDER — LIDOCAINE HCL 1 % IJ SOLN
INTRAMUSCULAR | Status: AC
  Filled 2017-07-11: qty 20

## 2017-07-11 MED ORDER — SODIUM CHLORIDE 0.9 % IV SOLN
INTRAVENOUS | Status: DC
  Administered 2017-07-11: 10:00:00 via INTRAVENOUS

## 2017-07-11 MED ORDER — NALOXONE HCL 0.4 MG/ML IJ SOLN
INTRAMUSCULAR | Status: AC
  Filled 2017-07-11: qty 1

## 2017-07-11 MED ORDER — FENTANYL CITRATE (PF) 100 MCG/2ML IJ SOLN
INTRAMUSCULAR | Status: AC
  Filled 2017-07-11: qty 4

## 2017-07-11 MED ORDER — FENTANYL CITRATE (PF) 100 MCG/2ML IJ SOLN
INTRAMUSCULAR | Status: AC | PRN
  Administered 2017-07-11 (×2): 50 ug via INTRAVENOUS
  Administered 2017-07-11 (×2): 25 ug via INTRAVENOUS
  Administered 2017-07-11: 50 ug via INTRAVENOUS

## 2017-07-11 NOTE — Discharge Instructions (Addendum)

## 2017-07-11 NOTE — H&P (Signed)
Chief Complaint: Patient was seen in consultation today for omental biopsy at the request of Cross,Melissa D  Referring Physician(s): Cross,Melissa D Dr Dorann Ou  Supervising Physician: Markus Daft  Patient Status: [facility] - Out-pt  History of Present Illness: Carol Ferguson is a 67 y.o. female   Hx MI 09/2016---on Brlinta LD 07/04/17 Developed abdominal pain 8 months ago--- work up ensued  Hx Possible cervical cancer Enlarging cervical mass (spindle cell neoplasm) New findings worrisome for ovarian cancer CA 125 was drawn on 06/27/17 and was elevated at 105. CEA was 4.5.  CT 06/27/2017: IMPRESSION: 1. New 7.4 cm left adnexal mass with worsening abdominal ascites and omental nodularity, suggesting metastatic ovarian carcinoma. Consider Gyn-Onc consultation.  Pt has seen Dr Dorann Ou Note 07/02/17: Carol Ferguson  is a 67 y.o.  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 Unclear of primary diagnosis.  We will follow-up today's biopsy from the cervix and compare to the omentum and the prior biopsy. 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  Pelvic radiation may play a role. Given the extensive involvement of the cervix and vagina, I do not feel that surgery at this time will play a role.   Biopsy has been ordered. We will facilitate an appointment with Dr Alvy Bimler once pathology is available.    Scheduled now for omental biopsy  Past Medical History:  Diagnosis Date  . Anxiety   . 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 CAD (coronary artery disease)    a. Inf STEMI/LHC 09/2016 which showed occluded mid LCx treated with PCI, DES; remaining cath details included a proximal to mid LAD lesion of 20% stenosis, and proximal to mid RCA 25% stenosis. EF 55-60%.  . Depression   . Diverticulosis   .  Gallstones   . Ischemic cardiomyopathy    a. 2D Echo 09/28/16 showed EF 55-60%, probable severe hypokinesis of the entire inferiormyocardium, normal diastolic parameters.  . Myocardial infarction ([place]) 09/26/2016  . Pneumonia   . Presence of tooth-root and mandibular implants   . Tobacco abuse   . Wears glasses     Past Surgical History:  Procedure Laterality Date  . CERVICAL CONIZATION W/BX N/A 01/28/2017   Procedure: CONIZATION CERVIX WITH BIOPSY;  Surgeon: [name], MD;  Location: [facility];  Service: Gynecology;  Laterality: N/A;  . CHOLECYSTECTOMY    . CORONARY STENT INTERVENTION N/A 09/26/2016   Procedure: Coronary Stent Intervention;  Surgeon: Belva Crome, MD;  Location: [place] [facility];  Service: Cardiovascular;  Laterality: N/A;  . DILATION AND CURETTAGE OF UTERUS N/A 01/28/2017   Procedure: DILATATION AND CURETTAGE WITH ULTRASOUND GUIDENCE;  Surgeon: [name], MD;  Location: [place];  Service: Gynecology;  Laterality: N/A;  . LEFT HEART CATH AND CORONARY ANGIOGRAPHY N/A 09/26/2016   Procedure: Left Heart Cath and Coronary Angiography;  Surgeon: Belva Crome, MD;  Location: [place] [facility];  Service: Cardiovascular;  Laterality: N/A;    Allergies: Flagyl [metronidazole]  Medications: Prior to Admission medications   Medication Sig Start Date End Date Taking? Authorizing Provider  aspirin 81 MG chewable tablet Chew 1 tablet (81 mg total) by mouth daily. 09/28/16  Yes Dunn, Ryan M, PA-C  FLUoxetine (PROZAC) 10 MG capsule TAKE 1 CAPSULE BY MOUTH EVERY DAY  04/28/17  Yes Tenna Delaine D, PA-C  acetaminophen (TYLENOL) 500 MG tablet Take 500 mg by mouth every 6 (six) hours as needed for mild pain, moderate pain or headache.     [provider]  atorvastatin (LIPITOR) 10 MG tablet Take 1 tablet (10 mg total) by mouth daily. 12/02/16 07/03/17  Belva Crome, MD  BRILINTA 90 MG TABS tablet TAKE 1 TABLET BY MOUTH TWICE A DAY 10/25/16    Dunn, Dayna N, PA-C  dicyclomine (BENTYL) 10 MG capsule TAKE 1 CAPSULE BY MOUTH EVERY 8 HOURS AS NEEDED FOR SPASMS. FOR ABDOMINAL DISCOMFORT 06/20/17   Mauri Pole, MD  nitroGLYCERIN (NITROSTAT) 0.4 MG SL tablet Place 1 tablet (0.4 mg total) under the tongue every 5 (five) minutes x 3 doses as needed for chest pain. 09/28/16   Dunn, Areta Haber, PA-C  omeprazole (PRILOSEC) 20 MG capsule Take 20 mg by mouth daily as needed.     [provider]  ondansetron (ZOFRAN) 4 MG tablet Take 1 tablet (4 mg total) by mouth every 8 (eight) hours as needed for nausea or vomiting. 04/18/17   Mauri Pole, MD  traZODone (DESYREL) 50 MG tablet Take 0.5-1 tablets (25-50 mg total) at bedtime as needed by mouth for sleep. 03/06/17   [name], PA-C     Family History  Problem Relation Age of Onset  . Cancer Brother        MELANOMA  . Heart disease Father   . Stroke Father   . Heart disease Brother   . Diabetes Brother   . Heart attack Brother   . Heart failure Brother     Social History   Socioeconomic History  . Marital status: Divorced    Spouse name: Not on file  . Number of children: 0  . Years of education: Not on file  . Highest education level: Not on file  Occupational History  . Occupation: retired  Scientific laboratory technician  . Financial resource strain: Not on file  . Food insecurity:    Worry: Not on file    Inability: Not on file  . Transportation needs:    Medical: Not on file    Non-medical: Not on file  Tobacco Use  . Smoking status: Former Smoker    Packs/day: 1.00    Years: 35.00    Pack years: 35.00    Types: Cigarettes    Last attempt to quit: 09/26/2016    Years since quitting: 0.7  . Smokeless tobacco: Never Used  Substance and Sexual Activity  . Alcohol use: Yes    Comment: occasional  . Drug use: No  . Sexual activity: Never  Lifestyle  . Physical activity:    Days per week: Not on file    Minutes per session: Not on file  . Stress: Not on file    Relationships  . Social connections:    Talks on phone: Not on file    Gets together: Not on file    Attends religious service: Not on file    Active member of club or organization: Not on file    Attends meetings of clubs or organizations: Not on file    Relationship status: Not on file  Other Topics Concern  . Not on file  Social History Narrative  . Not on file    Review of Systems: A 12 point ROS discussed and pertinent positives are indicated in the HPI above.  All other systems are negative.  Review of Systems  Constitutional: Negative for activity change, fatigue and fever.  Respiratory: Negative for cough and shortness of breath.   Gastrointestinal: Positive for abdominal pain.  Neurological: Negative for weakness.  Psychiatric/Behavioral: Negative for behavioral problems and confusion.    Vital Signs: BP (!) 111/58   Pulse 77   Temp 98.7 F (37.1 C)   Resp 20   Ht 5\' 5"  (1.651 m)   Wt 128 lb (58.1 kg)   SpO2 99%   BMI 21.30 kg/m   Physical Exam  Constitutional: She is oriented to person, place, and time.  Cardiovascular: Normal rate, regular rhythm and normal heart sounds.  Pulmonary/Chest: Effort normal and breath sounds normal.  Abdominal: Soft. Bowel sounds are normal.  Musculoskeletal: Normal range of motion.  Neurological: She is alert and oriented to person, place, and time.  Skin: Skin is warm and dry.  Psychiatric: She has a normal mood and affect. Her behavior is normal. Judgment and thought content normal.  Nursing note and vitals reviewed.   Imaging: US Pelvis Transvanginal Non-ob (tv Only)  Result Date: 07/01/2017 T/V images.  Anteverted and heterogenous uterus measuring 5.85 x 3.87 x 3.36 cm.  Right subserous fibroid with calcifications measuring 2.7 x 2.6 cm.  Endometrial line appears thickened at 8.1 mm.  Cervix appears normal, no mass seen.  Right ovary not seen.  Left adnexal mass cystic/solid measuring 7.6 x 6.0 x 7.1 cm.  Positive color  flow Doppler.  No free fluid in the posterior cul-de-sac.  Ct Abdomen Pelvis W Contrast  Result Date: 06/27/2017 CLINICAL DATA:  Generalized abd pain, mild to severe at times since heart attack in June 2018. Recent ovarian cyst seen on [place]. No other symptoms. EXAM: CT ABDOMEN AND PELVIS WITH CONTRAST TECHNIQUE: Multidetector CT imaging of the abdomen and pelvis was performed using the standard protocol following bolus administration of intravenous contrast. CONTRAST:  169mL ISOVUE-300 IOPAMIDOL (ISOVUE-300) INJECTION 61% COMPARISON:  03/22/2017 FINDINGS: Lower chest: No acute abnormality. Hepatobiliary: No focal liver abnormality is seen. Status post cholecystectomy. No biliary dilatation. Pancreas: Unremarkable. No pancreatic ductal dilatation or surrounding inflammatory changes. Spleen: Normal in size without focal abnormality. Adrenals/Urinary Tract: Normal adrenals. Stable left upper pole renal cyst. No solid renal mass. No hydronephrosis. Urinary bladder incompletely distended. Stomach/Bowel: Stomach is incompletely distended. Small bowel is nondilated. Colon and rectum physiologically distended. Vascular/Lymphatic: Scattered aortoiliac atheromatous calcifications without aneurysm. Portal vein patent. No abdominal or pelvic adenopathy. Reproductive: Uterus and right adnexal region unremarkable. 7.4 cm mixed solid/cystic left adnexal mass, new since prior exam. Other: There is a small amount of perihepatic and lower peritoneal ascites, slightly increased since prior. Progressive omental nodularity in the left lower quadrant and anterior right mid abdomen. No free air. 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 No fracture or worrisome bone lesion. IMPRESSION: 1. New 7.4 cm left adnexal mass with worsening abdominal ascites and omental nodularity, suggesting metastatic ovarian carcinoma. Consider Gyn-Onc consultation. Electronically Signed   By: Lucrezia Europe M.D.   On: 06/27/2017  11:33   US Abdominal Pelvic Art/vent Flow Doppler Limited  Result Date: 07/01/2017 T/V images.  Anteverted and heterogenous uterus measuring 5.85 x 3.87 x 3.36 cm.  Right subserous fibroid with calcifications measuring 2.7 x 2.6 cm.  Endometrial line appears thickened at 8.1 mm.  Cervix appears normal, no mass seen.  Right ovary not seen.  Left adnexal mass cystic/solid measuring 7.6 x 6.0 x 7.1 cm.  Positive color flow Doppler.  No free fluid in the posterior cul-de-sac.  Labs:  CBC: Recent Labs    09/26/16 1253 09/27/16 0407 12/03/16 0900 01/28/17 0723 02/01/17 1141 03/21/17 2334  WBC 10.0 8.1 7.9  --  9.6 13.7*  HGB 15.0 13.8 15.4 14.3 14.6 15.2*  HCT 43.9 41.1 45.3 42.0 42.1 43.1  PLT 193 181  --   --  245 232    COAGS: No results for input(s): INR, APTT in the last 8760 hours.  BMP: Recent Labs    09/26/16 1253  09/27/16 0407 01/01/17 1614 01/28/17 0723 02/01/17 1141 03/21/17 2334 06/18/17 1612  NA  --    < > 139  --  141 138 141 140  K  --    < > 3.6  --  3.8 3.7 3.2* 3.8  CL  --   --  107  --   --  104 106 104  CO2  --   --  25  --   --  22 25 31   GLUCOSE  --    < > 112*  --  98 100* 129* 96  BUN  --   --  13  --   --  16 20 26*  CALCIUM  --   --  8.5*  --   --  9.1 9.9 9.5  CREATININE 0.83  --  0.80 0.80  --  0.80 0.81 0.76  GFRNONAA >60  --  >60  --   --  >60 >60  --   GFRAA >60  --  >60  --   --  >60 >60  --    < > = values in this interval not displayed.    LIVER FUNCTION TESTS: Recent Labs    10/08/16 0936 11/28/16 0849 02/01/17 1141 03/21/17 2334  BILITOT 0.4 0.5 0.7 1.0  AST 19 20 18 18   ALT 18 22 15 16   ALKPHOS 97 99 96 100  PROT 7.0 6.7 7.2 7.3  ALBUMIN 4.3 4.3 3.8 4.0    TUMOR MARKERS: Recent Labs    06/26/17 1103  CEA 4.5*    Assessment and Plan:  Recent MI-- on Brilinta LD 07/04/17 Cervical mass: poss cervical cancer - spindle cell neoplasm New omental stranding; Ovarian abnormality-- poss ovarian cancer Scheduled for  omental biopsy Risks and benefits discussed with the patient including, but not limited to bleeding, infection, damage to adjacent structures or low yield requiring additional tests.  All of the patient's questions were answered, patient is agreeable to proceed. Consent signed and in chart.    Thank you for this interesting consult.  I greatly enjoyed meeting Carol Ferguson and look forward to participating in their care.  A copy of this report was sent to the requesting provider on this date.  Electronically Signed: Lavonia Drafts, PA-C 07/11/2017, 10:26 AM   I spent a total of  30 Minutes   in face to face in clinical consultation, greater than 50% of which was counseling/coordinating care for omental biopsy

## 2017-07-11 NOTE — Sedation Documentation (Signed)
Patient is resting comfortably. 

## 2017-07-11 NOTE — Procedures (Signed)
CT guided biopsy of peritoneal thickening in left lower abdomen.  Very challenging biopsy due to small amount of disease and location of bowel.  Core biopsies obtained but only scant material obtained.  Minimal blood loss and no immediate complication.

## 2017-07-12 ENCOUNTER — Inpatient Hospital Stay (HOSPITAL_COMMUNITY)
Admission: EM | Admit: 2017-07-12 | Discharge: 2017-07-14 | DRG: 375 | Disposition: A | Discharge status: Home / Self Care | Payer: Medicare Other | Attending: Internal Medicine | Admitting: Internal Medicine

## 2017-07-12 ENCOUNTER — Encounter (HOSPITAL_COMMUNITY): Payer: Self-pay

## 2017-07-12 ENCOUNTER — Emergency Department (HOSPITAL_COMMUNITY): Payer: Medicare Other

## 2017-07-12 ENCOUNTER — Inpatient Hospital Stay (HOSPITAL_COMMUNITY): Payer: Medicare Other

## 2017-07-12 ENCOUNTER — Other Ambulatory Visit: Payer: Self-pay

## 2017-07-12 DIAGNOSIS — K56600 Partial intestinal obstruction, unspecified as to cause: Secondary | ICD-10-CM | POA: Diagnosis not present

## 2017-07-12 DIAGNOSIS — I5032 Chronic diastolic (congestive) heart failure: Secondary | ICD-10-CM | POA: Diagnosis not present

## 2017-07-12 DIAGNOSIS — Z79899 Other long term (current) drug therapy: Secondary | ICD-10-CM | POA: Diagnosis not present

## 2017-07-12 DIAGNOSIS — Z87891 Personal history of nicotine dependence: Secondary | ICD-10-CM | POA: Diagnosis not present

## 2017-07-12 DIAGNOSIS — Z4682 Encounter for fitting and adjustment of non-vascular catheter: Secondary | ICD-10-CM | POA: Diagnosis not present

## 2017-07-12 DIAGNOSIS — Z965 Presence of tooth-root and mandibular implants: Secondary | ICD-10-CM | POA: Diagnosis present

## 2017-07-12 DIAGNOSIS — K219 Gastro-esophageal reflux disease without esophagitis: Secondary | ICD-10-CM | POA: Diagnosis present

## 2017-07-12 DIAGNOSIS — Z720 Tobacco use: Secondary | ICD-10-CM | POA: Diagnosis present

## 2017-07-12 DIAGNOSIS — F329 Major depressive disorder, single episode, unspecified: Secondary | ICD-10-CM | POA: Diagnosis present

## 2017-07-12 DIAGNOSIS — I959 Hypotension, unspecified: Secondary | ICD-10-CM | POA: Diagnosis present

## 2017-07-12 DIAGNOSIS — E785 Hyperlipidemia, unspecified: Secondary | ICD-10-CM | POA: Diagnosis not present

## 2017-07-12 DIAGNOSIS — R1084 Generalized abdominal pain: Secondary | ICD-10-CM | POA: Diagnosis not present

## 2017-07-12 DIAGNOSIS — I2510 Atherosclerotic heart disease of native coronary artery without angina pectoris: Secondary | ICD-10-CM | POA: Diagnosis present

## 2017-07-12 DIAGNOSIS — I252 Old myocardial infarction: Secondary | ICD-10-CM | POA: Diagnosis not present

## 2017-07-12 DIAGNOSIS — K56609 Unspecified intestinal obstruction, unspecified as to partial versus complete obstruction: Secondary | ICD-10-CM | POA: Diagnosis not present

## 2017-07-12 DIAGNOSIS — C562 Malignant neoplasm of left ovary: Secondary | ICD-10-CM | POA: Diagnosis present

## 2017-07-12 DIAGNOSIS — M5136 Other intervertebral disc degeneration, lumbar region: Secondary | ICD-10-CM | POA: Diagnosis present

## 2017-07-12 DIAGNOSIS — C786 Secondary malignant neoplasm of retroperitoneum and peritoneum: Principal | ICD-10-CM | POA: Diagnosis present

## 2017-07-12 DIAGNOSIS — Z888 Allergy status to other drugs, medicaments and biological substances status: Secondary | ICD-10-CM

## 2017-07-12 DIAGNOSIS — Z8249 Family history of ischemic heart disease and other diseases of the circulatory system: Secondary | ICD-10-CM

## 2017-07-12 DIAGNOSIS — Z7982 Long term (current) use of aspirin: Secondary | ICD-10-CM | POA: Diagnosis not present

## 2017-07-12 DIAGNOSIS — I255 Ischemic cardiomyopathy: Secondary | ICD-10-CM | POA: Diagnosis present

## 2017-07-12 DIAGNOSIS — Z9049 Acquired absence of other specified parts of digestive tract: Secondary | ICD-10-CM

## 2017-07-12 DIAGNOSIS — Z808 Family history of malignant neoplasm of other organs or systems: Secondary | ICD-10-CM | POA: Diagnosis not present

## 2017-07-12 DIAGNOSIS — R112 Nausea with vomiting, unspecified: Secondary | ICD-10-CM

## 2017-07-12 DIAGNOSIS — Z0189 Encounter for other specified special examinations: Secondary | ICD-10-CM

## 2017-07-12 DIAGNOSIS — C801 Malignant (primary) neoplasm, unspecified: Secondary | ICD-10-CM

## 2017-07-12 DIAGNOSIS — Z955 Presence of coronary angioplasty implant and graft: Secondary | ICD-10-CM

## 2017-07-12 DIAGNOSIS — I25118 Atherosclerotic heart disease of native coronary artery with other forms of angina pectoris: Secondary | ICD-10-CM

## 2017-07-12 DIAGNOSIS — R1110 Vomiting, unspecified: Secondary | ICD-10-CM | POA: Diagnosis not present

## 2017-07-12 DIAGNOSIS — R42 Dizziness and giddiness: Secondary | ICD-10-CM | POA: Diagnosis not present

## 2017-07-12 DIAGNOSIS — Z8701 Personal history of pneumonia (recurrent): Secondary | ICD-10-CM | POA: Diagnosis not present

## 2017-07-12 DIAGNOSIS — R404 Transient alteration of awareness: Secondary | ICD-10-CM | POA: Diagnosis not present

## 2017-07-12 DIAGNOSIS — R109 Unspecified abdominal pain: Secondary | ICD-10-CM | POA: Diagnosis not present

## 2017-07-12 DIAGNOSIS — K5730 Diverticulosis of large intestine without perforation or abscess without bleeding: Secondary | ICD-10-CM | POA: Diagnosis present

## 2017-07-12 LAB — URINALYSIS, ROUTINE W REFLEX MICROSCOPIC
BILIRUBIN URINE: NEGATIVE
Bacteria, UA: NONE SEEN
GLUCOSE, UA: NEGATIVE mg/dL
KETONES UR: NEGATIVE mg/dL
LEUKOCYTES UA: NEGATIVE
NITRITE: NEGATIVE
PH: 7 (ref 5.0–8.0)
PROTEIN: NEGATIVE mg/dL
Specific Gravity, Urine: 1.009 (ref 1.005–1.030)

## 2017-07-12 LAB — CBC
HCT: 41.8 % (ref 36.0–46.0)
Hemoglobin: 14.2 g/dL (ref 12.0–15.0)
MCH: 33.1 pg (ref 26.0–34.0)
MCHC: 34 g/dL (ref 30.0–36.0)
MCV: 97.4 fL (ref 78.0–100.0)
PLATELETS: 215 10*3/uL (ref 150–400)
RBC: 4.29 MIL/uL (ref 3.87–5.11)
RDW: 12.9 % (ref 11.5–15.5)
WBC: 8.1 10*3/uL (ref 4.0–10.5)

## 2017-07-12 LAB — COMPREHENSIVE METABOLIC PANEL
ALT: 13 U/L — AB (ref 14–54)
ANION GAP: 11 (ref 5–15)
AST: 19 U/L (ref 15–41)
Albumin: 3.2 g/dL — ABNORMAL LOW (ref 3.5–5.0)
Alkaline Phosphatase: 85 U/L (ref 38–126)
BUN: 19 mg/dL (ref 6–20)
CHLORIDE: 102 mmol/L (ref 101–111)
CO2: 22 mmol/L (ref 22–32)
CREATININE: 0.85 mg/dL (ref 0.44–1.00)
Calcium: 8.6 mg/dL — ABNORMAL LOW (ref 8.9–10.3)
GFR calc non Af Amer: >60 mL/min (ref >60)
Glucose, Bld: 154 mg/dL — ABNORMAL HIGH (ref 65–99)
POTASSIUM: 3.8 mmol/L (ref 3.5–5.1)
SODIUM: 135 mmol/L (ref 135–145)
Total Bilirubin: 0.6 mg/dL (ref 0.3–1.2)
Total Protein: 6.1 g/dL — ABNORMAL LOW (ref 6.5–8.1)

## 2017-07-12 LAB — LIPASE, BLOOD: LIPASE: 25 U/L (ref 11–51)

## 2017-07-12 LAB — PROTIME-INR
INR: 1.11
PROTHROMBIN TIME: 14.2 s (ref 11.4–15.2)

## 2017-07-12 LAB — I-STAT CG4 LACTIC ACID, ED: Lactic Acid, Venous: 1.75 mmol/L (ref 0.5–1.9)

## 2017-07-12 MED ORDER — FENTANYL CITRATE (PF) 100 MCG/2ML IJ SOLN
50.0000 ug | Freq: Three times a day (TID) | INTRAMUSCULAR | Status: DC | PRN
  Administered 2017-07-12 (×2): 50 ug via INTRAVENOUS
  Filled 2017-07-12 (×2): qty 2

## 2017-07-12 MED ORDER — ATORVASTATIN CALCIUM 10 MG PO TABS
10.0000 mg | ORAL_TABLET | Freq: Every day | ORAL | Status: DC
  Administered 2017-07-12: 10 mg via ORAL
  Filled 2017-07-12: qty 1

## 2017-07-12 MED ORDER — ONDANSETRON HCL 4 MG/2ML IJ SOLN
4.0000 mg | Freq: Four times a day (QID) | INTRAMUSCULAR | Status: DC | PRN
  Administered 2017-07-12 (×2): 4 mg via INTRAVENOUS
  Filled 2017-07-12 (×2): qty 2

## 2017-07-12 MED ORDER — SODIUM CHLORIDE 0.9 % IV BOLUS (SEPSIS)
1000.0000 mL | Freq: Once | INTRAVENOUS | Status: AC
  Administered 2017-07-12: 1000 mL via INTRAVENOUS

## 2017-07-12 MED ORDER — ACETAMINOPHEN 650 MG RE SUPP: 650.0000 mg | Freq: Four times a day (QID) | RECTAL | Status: DC | PRN

## 2017-07-12 MED ORDER — ACETAMINOPHEN 325 MG PO TABS: 650.0000 mg | ORAL_TABLET | Freq: Four times a day (QID) | ORAL | Status: DC | PRN

## 2017-07-12 MED ORDER — ONDANSETRON HCL 4 MG/2ML IJ SOLN
4.0000 mg | Freq: Once | INTRAMUSCULAR | Status: AC
Start: 2017-07-12 — End: 2017-07-12
  Administered 2017-07-12: 4 mg via INTRAVENOUS
  Filled 2017-07-12: qty 2

## 2017-07-12 MED ORDER — PROCHLORPERAZINE EDISYLATE 5 MG/ML IJ SOLN: 10.0000 mg | Freq: Once | INTRAMUSCULAR | Status: DC

## 2017-07-12 MED ORDER — ONDANSETRON HCL 4 MG PO TABS: 4.0000 mg | ORAL_TABLET | Freq: Four times a day (QID) | ORAL | Status: DC | PRN

## 2017-07-12 MED ORDER — SODIUM CHLORIDE 0.9 % IV SOLN
INTRAVENOUS | Status: DC
  Administered 2017-07-12: 08:00:00 via INTRAVENOUS

## 2017-07-12 MED ORDER — ASPIRIN 300 MG RE SUPP
300.0000 mg | Freq: Every day | RECTAL | Status: DC
  Administered 2017-07-12 – 2017-07-14 (×3): 300 mg via RECTAL
  Filled 2017-07-12 (×4): qty 1

## 2017-07-12 MED ORDER — DIATRIZOATE MEGLUMINE & SODIUM 66-10 % PO SOLN
90.0000 mL | Freq: Once | ORAL | Status: AC
  Administered 2017-07-12: 90 mL via NASOGASTRIC
  Filled 2017-07-12: qty 90

## 2017-07-12 MED ORDER — KCL IN DEXTROSE-NACL 20-5-0.45 MEQ/L-%-% IV SOLN
INTRAVENOUS | Status: DC
  Administered 2017-07-12 – 2017-07-14 (×5): via INTRAVENOUS
  Filled 2017-07-12 (×5): qty 1000

## 2017-07-12 MED ORDER — FLUOXETINE HCL 10 MG PO CAPS
10.0000 mg | ORAL_CAPSULE | Freq: Every day | ORAL | Status: DC
  Administered 2017-07-12 – 2017-07-14 (×3): 10 mg via ORAL
  Filled 2017-07-12 (×3): qty 1

## 2017-07-12 MED ORDER — IOPAMIDOL (ISOVUE-300) INJECTION 61%
INTRAVENOUS | Status: AC
  Administered 2017-07-12: 100 mL
  Filled 2017-07-12: qty 100

## 2017-07-12 MED ORDER — ONDANSETRON HCL 4 MG/2ML IJ SOLN
4.0000 mg | Freq: Once | INTRAMUSCULAR | Status: AC
  Administered 2017-07-12: 4 mg via INTRAVENOUS
  Filled 2017-07-12: qty 2

## 2017-07-12 MED ORDER — FENTANYL CITRATE (PF) 100 MCG/2ML IJ SOLN
50.0000 ug | Freq: Once | INTRAMUSCULAR | Status: AC
  Administered 2017-07-12: 50 ug via INTRAVENOUS
  Filled 2017-07-12: qty 2

## 2017-07-12 MED ORDER — PIPERACILLIN-TAZOBACTAM 3.375 G IVPB 30 MIN
3.3750 g | Freq: Once | INTRAVENOUS | Status: AC
  Administered 2017-07-12: 3.375 g via INTRAVENOUS
  Filled 2017-07-12: qty 50

## 2017-07-12 MED ORDER — PANTOPRAZOLE SODIUM 40 MG IV SOLR
40.0000 mg | Freq: Every day | INTRAVENOUS | Status: DC
  Administered 2017-07-12 – 2017-07-13 (×2): 40 mg via INTRAVENOUS
  Filled 2017-07-12 (×2): qty 40

## 2017-07-12 MED ORDER — HYDRALAZINE HCL 20 MG/ML IJ SOLN: 10.0000 mg | Freq: Three times a day (TID) | INTRAMUSCULAR | Status: DC | PRN

## 2017-07-12 MED ORDER — PANTOPRAZOLE SODIUM 40 MG IV SOLR: 40.0000 mg | INTRAVENOUS | Status: DC

## 2017-07-12 MED ORDER — NITROGLYCERIN 0.4 MG SL SUBL: 0.4000 mg | SUBLINGUAL_TABLET | SUBLINGUAL | Status: DC | PRN

## 2017-07-12 MED ORDER — FENTANYL CITRATE (PF) 100 MCG/2ML IJ SOLN: 100.0000 ug | Freq: Once | INTRAMUSCULAR | Status: DC

## 2017-07-12 MED ORDER — TRAZODONE HCL 50 MG PO TABS
50.0000 mg | ORAL_TABLET | Freq: Every evening | ORAL | Status: DC | PRN
  Administered 2017-07-12 – 2017-07-13 (×2): 50 mg via ORAL
  Filled 2017-07-12 (×2): qty 1

## 2017-07-12 NOTE — ED Provider Notes (Signed)
PROGRESS NOTE                                                                                                                 This is a sign-out from [place]  at shift change: Carol Ferguson is a 67 y.o. female presenting with abdominal pain s/p difficult biopsy N/V 85 systolic. Plan is to follow up CT. Please refer to previous note for full HPI, ROS, PMH and PE.   Patient has ACS, has been Brillenta off 7 days scheduled to restart today.    Patient seen and evaluated the bedside, appears acutely uncomfortable, tenderness to 4 quadrants with guarding and voluntary rebound.  Heart is a regular rate and rhythm, lung sounds clear to auscultation bilaterally.  Has concerning abdominal exam.  CT reveals a small bowel obstruction.  Attending has evaluated the images believes there is free air, question whether this is postprocedural versus small perforation.  Discussed with surgery who will evaluate the patient.  Surgery recommends medical admission.  Discussed with Dr. Aggie Moats of [facility] who will evaluate the patient    Carol Ferguson 07/12/17 1275    Jola Schmidt, MD 07/13/17 0111

## 2017-07-12 NOTE — ED Triage Notes (Signed)
Pt BIB [facility] for eval of N/V and abd pain onset approx 10PM last night. Pt reports she had a needle biopsy done Friday @ noon yesterday for ovarian CA. States she initally felt OK, started w/ pain at 10P last night, as well as N/V.

## 2017-07-12 NOTE — ED Notes (Signed)
EDP at bedside  

## 2017-07-12 NOTE — Plan of Care (Signed)
  Problem: Clinical Measurements: Goal: Diagnostic test results will improve Outcome: Progressing   

## 2017-07-12 NOTE — ED Notes (Signed)
Attempted to call report x 1  

## 2017-07-12 NOTE — Progress Notes (Signed)
MD made aware that patient vomited 45 minutes after gastrogarffin was given.

## 2017-07-12 NOTE — ED Notes (Signed)
Admitting at bedside 

## 2017-07-12 NOTE — ED Notes (Signed)
Assisted pt too restroom with no difficulties.

## 2017-07-12 NOTE — ED Notes (Signed)
Surgeon at bedside.  

## 2017-07-12 NOTE — ED Notes (Signed)
Attempted to call report x2

## 2017-07-12 NOTE — Consult Note (Signed)
[facility] Surgery Consult/Admission Note  Carol Ferguson [birth year]  [id number].    Requesting MD: Dr. Venora Maples Chief Complaint/Reason for Consult: SBO  HPI:   Pt is a 67 yo female with a history of MI s/p stent 09/2016 on brillenta but has been held due to peritoneal biopsy yesterday, cholecystectomy, and ischemic cardiomyopathy who presented to the ED with complaints of abdominal pain, nausea and vomiting. Pt had a peritoneal biopsy performed yesterday in her LLQ to determine the etiology of her carcinomatosis. Last night she had some abdominal pain at 2200 that resolved. Pain returned around 0100 with nausea and brown emesis. Pain is in her RLQ, constant, severe at times. Associated diaphoresis and fatigue. Pt denies fever, chills, flatus or a BM since onset of pain. CT today shows SBO with transition point in the RLQ. Findings suggestive of metastatic ovarian cancer. WBC 8.1. Afebrile.    ROS:  Review of Systems  Constitutional: Positive for diaphoresis and malaise/fatigue. Negative for chills and fever.  HENT: Negative for sore throat.   Respiratory: Negative for shortness of breath.   Cardiovascular: Negative for chest pain.  Gastrointestinal: Positive for abdominal pain, nausea and vomiting. Negative for blood in stool and diarrhea.  Musculoskeletal: Negative for falls.  Skin: Negative for rash.  Neurological: Negative for dizziness, loss of consciousness and weakness.  All other systems reviewed and are negative.    Family History  Problem Relation Age of Onset  . Cancer Brother        MELANOMA  . Heart disease Father   . Stroke Father   . Heart disease Brother   . Diabetes Brother   . Heart attack Brother   . Heart failure Brother     Past Medical History:  Diagnosis Date  . Anxiety   . 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 CAD (coronary artery disease)    a. Inf STEMI/LHC 09/2016 which showed occluded mid LCx treated with PCI, DES; remaining cath  details included a proximal to mid LAD lesion of 20% stenosis, and proximal to mid RCA 25% stenosis. EF 55-60%.  . Depression   . Diverticulosis   . Gallstones   . Ischemic cardiomyopathy    a. 2D Echo 09/28/16 showed EF 55-60%, probable severe hypokinesis of the entire inferiormyocardium, normal diastolic parameters.  . Myocardial infarction ([place]) 09/26/2016  . Pneumonia   . Presence of tooth-root and mandibular implants   . Tobacco abuse   . Wears glasses     Past Surgical History:  Procedure Laterality Date  . CERVICAL CONIZATION W/BX N/A 01/28/2017   Procedure: CONIZATION CERVIX WITH BIOPSY;  Surgeon: [name], MD;  Location: [facility];  Service: Gynecology;  Laterality: N/A;  . CHOLECYSTECTOMY    . CORONARY STENT INTERVENTION N/A 09/26/2016   Procedure: Coronary Stent Intervention;  Surgeon: Belva Crome, MD;  Location: [facility];  Service: Cardiovascular;  Laterality: N/A;  . DILATION AND CURETTAGE OF UTERUS N/A 01/28/2017   Procedure: DILATATION AND CURETTAGE WITH ULTRASOUND GUIDENCE;  Surgeon: [name], MD;  Location: [facility];  Service: Gynecology;  Laterality: N/A;  . LEFT HEART CATH AND CORONARY ANGIOGRAPHY N/A 09/26/2016   Procedure: Left Heart Cath and Coronary Angiography;  Surgeon: Belva Crome, MD;  Location: [facility];  Service: Cardiovascular;  Laterality: N/A;    Social History:  reports that she quit smoking about 9 months ago. Her smoking use included cigarettes. She has a  35.00 pack-year smoking history. She has never used smokeless tobacco. She reports that she drinks alcohol. She reports that she does not use drugs.  Allergies:  Allergies  Allergen Reactions  . Flagyl [Metronidazole] Nausea Only    DIARRHEA     (Not in a hospital admission)  Blood pressure 105/68, pulse 72, temperature (!) 97.3 F (36.3 C), temperature source Oral, resp. rate 20, height 5' 5" (1.651 m), weight 128 lb (58.1 kg), SpO2  98 %.  Physical Exam  Constitutional: She is oriented to person, place, and time and well-developed, well-nourished, and in no distress. Vital signs are normal. No distress.  HENT:  Head: Normocephalic and atraumatic.  Nose: Nose normal.  Mouth/Throat: Oropharynx is clear and moist. No oropharyngeal exudate.  Eyes: Pupils are equal, round, and reactive to light. Conjunctivae are normal. Right eye exhibits no discharge. Left eye exhibits no discharge. No scleral icterus.  Neck: Normal range of motion. Neck supple. No thyromegaly present.  Cardiovascular: Normal rate, regular rhythm, normal heart sounds and intact distal pulses. Exam reveals no gallop and no friction rub.  No murmur heard. Pulses:      Radial pulses are 2+ on the right side, and 2+ on the left side.       Dorsalis pedis pulses are 2+ on the right side, and 2+ on the left side.  Pulmonary/Chest: Effort normal and breath sounds normal. No respiratory distress. She has no decreased breath sounds. She has no wheezes. She has no rhonchi. She has no rales.  Abdominal: Soft. Normal appearance. She exhibits no distension and no mass. Bowel sounds are hypoactive. There is no hepatosplenomegaly. There is tenderness in the right lower quadrant. There is no rigidity, no rebound and no guarding.  No peritonitis   Musculoskeletal: Normal range of motion. She exhibits no edema, tenderness or deformity.  Lymphadenopathy:    She has no cervical adenopathy.  Neurological: She is alert and oriented to person, place, and time.  Skin: Skin is warm and dry. No rash noted. She is not diaphoretic.  Psychiatric: Mood and affect normal.  Nursing note and vitals reviewed.   Results for orders placed or performed during the hospital encounter of 07/12/17 (from the past 48 hour(s))  CBC     Status: None   Collection Time: 07/12/17  4:36 AM  Result Value Ref Range   WBC 8.1 4.0 - 10.5 K/uL   RBC 4.29 3.87 - 5.11 MIL/uL   Hemoglobin 14.2 12.0 - 15.0  g/dL   HCT 41.8 36.0 - 46.0 %   MCV 97.4 78.0 - 100.0 fL   MCH 33.1 26.0 - 34.0 pg   MCHC 34.0 30.0 - 36.0 g/dL   RDW 12.9 11.5 - 15.5 %   Platelets 215 150 - 400 K/uL    Comment: Performed at [facility], [place] [address] [place], [place] [address]  Comprehensive metabolic panel     Status: Abnormal   Collection Time: 07/12/17  4:36 AM  Result Value Ref Range   Sodium 135 135 - 145 mmol/L   Potassium 3.8 3.5 - 5.1 mmol/L   Chloride 102 101 - 111 mmol/L   CO2 22 22 - 32 mmol/L   Glucose, Bld 154 (H) 65 - 99 mg/dL   BUN 19 6 - 20 mg/dL   Creatinine, Ser 0.85 0.44 - 1.00 mg/dL   Calcium 8.6 (L) 8.9 - 10.3 mg/dL   Total Protein 6.1 (L) 6.5 - 8.1 g/dL   Albumin 3.2 (L) 3.5 -  5.0 g/dL   AST 19 15 - 41 U/L   ALT 13 (L) 14 - 54 U/L   Alkaline Phosphatase 85 38 - 126 U/L   Total Bilirubin 0.6 0.3 - 1.2 mg/dL   GFR calc non Af Amer >60 >60 mL/min   GFR calc Af Amer >60 >60 mL/min    Comment: (NOTE) The eGFR has been calculated using the CKD EPI equation. This calculation has not been validated in all clinical situations. eGFR's persistently <60 mL/min signify possible Chronic Kidney Disease.    Anion gap 11 5 - 15    Comment: Performed at [facility], [address] [place], [place] [address]  Lipase, blood     Status: None   Collection Time: 07/12/17  4:36 AM  Result Value Ref Range   Lipase 25 11 - 51 U/L    Comment: Performed at [facility], [address] [place], [place] [address]  Protime-INR     Status: None   Collection Time: 07/12/17  4:52 AM  Result Value Ref Range   Prothrombin Time 14.2 11.4 - 15.2 seconds   INR 1.11     Comment: Performed at [facility], [address] [place], [place] [address]  I-Stat CG4 Lactic Acid, ED     Status: None   Collection Time: 07/12/17  4:54 AM  Result Value Ref Range   Lactic Acid, Venous 1.75 0.5 - 1.9 mmol/L  Urinalysis, Routine w reflex microscopic     Status: Abnormal   Collection Time: 07/12/17   5:40 AM  Result Value Ref Range   Color, Urine YELLOW YELLOW   APPearance CLEAR CLEAR   Specific Gravity, Urine 1.009 1.005 - 1.030   pH 7.0 5.0 - 8.0   Glucose, UA NEGATIVE NEGATIVE mg/dL   Hgb urine dipstick SMALL (A) NEGATIVE   Bilirubin Urine NEGATIVE NEGATIVE   Ketones, ur NEGATIVE NEGATIVE mg/dL   Protein, ur NEGATIVE NEGATIVE mg/dL   Nitrite NEGATIVE NEGATIVE   Leukocytes, UA NEGATIVE NEGATIVE   RBC / HPF 0-5 0 - 5 RBC/hpf   WBC, UA 6-30 0 - 5 WBC/hpf   Bacteria, UA NONE SEEN NONE SEEN   Squamous Epithelial / LPF 0-5 (A) NONE SEEN   Mucus PRESENT     Comment: Performed at [facility], [address] [place], [place] [address]   Ct Abdomen Pelvis W Contrast  Result Date: 07/12/2017 CLINICAL DATA:  Abdominal pain with nausea and vomiting. Percutaneous peritoneal implant biopsy yesterday. EXAM: CT ABDOMEN AND PELVIS WITH CONTRAST TECHNIQUE: Multidetector CT imaging of the abdomen and pelvis was performed using the standard protocol following bolus administration of intravenous contrast. CONTRAST:  100mL ISOVUE-300 IOPAMIDOL (ISOVUE-300) INJECTION 61% COMPARISON:  CT abdomen pelvis dated June 27, 2017. FINDINGS: Lower chest: No acute abnormality. Hepatobiliary: No focal liver abnormality is seen. Status post cholecystectomy. No biliary dilatation. Pancreas: Unremarkable. No pancreatic ductal dilatation or surrounding inflammatory changes. Spleen: Normal in size without focal abnormality. Adrenals/Urinary Tract: The adrenal glands are unremarkable. Stable left renal cyst. No renal or ureteral calculi. The bladder is unremarkable. Stomach/Bowel: There are new multiple dilated loops of small bowel with a transition point seen in the right lower quadrant (series 3, image 52). The stomach is unremarkable. The colon is decompressed. Normal appendix. Vascular/Lymphatic: Aortic atherosclerosis. No enlarged abdominal or pelvic lymph nodes. Reproductive: Mixed cystic and solid mass in the left  ovary is unchanged. The uterus and right ovary are unremarkable. Other: Slight   interval increase in intra-abdominal ascites. Omental nodularity in the left lower quadrant and right abdomen is largely unchanged. There are a few foci of air around the left lower quadrant peritoneal nodularity at the site of biopsy. Musculoskeletal: Postprocedure changes in the left lower anterior abdominal wall. No acute or suspicious osseous findings. IMPRESSION: 1. New small bowel obstruction with transition point seen in the right lower quadrant, possibly secondary to adhesions related to peritoneal carcinomatosis. No pneumatosis. 2. Findings suggestive of metastatic ovarian cancer. Slightly worsened abdominal ascites may be related to metastatic disease or reactive to the small-bowel obstruction. 3. Expected post biopsy changes in the left lower quadrant adjacent to peritoneal nodularity. No intra-abdominal hemorrhage. Electronically Signed   By: Titus Dubin M.D.   On: 07/12/2017 07:34   Ct Biopsy  Result Date: 07/11/2017 INDICATION: 67 year old with spindle cell neoplasm of the cervix and evidence for peritoneal carcinomatosis. There is also concern for ovarian lesions. EXAM: CT-GUIDED PERITONEAL BIOPSY MEDICATIONS: None. ANESTHESIA/SEDATION: Moderate (conscious) sedation was employed during this procedure. A total of Versed 4.0 mg and Fentanyl 200 mcg was administered intravenously. Moderate Sedation Time: 44 minutes. The patient's level of consciousness and vital signs were monitored continuously by radiology nursing throughout the procedure under my direct supervision. FLUOROSCOPY TIME:  None COMPLICATIONS: None immediate. PROCEDURE: Informed written consent was obtained from the patient after a thorough discussion of the procedural risks, benefits and alternatives. All questions were addressed. A timeout was performed prior to the initiation of the procedure. Patient was placed supine on CT scanner. Images through the  abdomen were obtained. The peritoneal thickening in the left lower abdomen was targeted. The anterior and left side of the abdomen was prepped with chlorhexidine and a sterile field was created. Skin was anesthetized with 1% lidocaine. 17 gauge coaxial needle was directed towards this peritoneal thickening from medial to lateral approach with CT guidance. Needle was repositioned multiple times. It was very difficult to position the coaxial needle within the peritoneal thickening without risking bowel injury. Eventually, the needle was felt to be safely along the periphery of the peritoneal cavity. A total of 3 core biopsies were obtained with an 18 core device. Tiny pieces of tissue were collected and placed in formalin. Needle was removed without complication. FINDINGS: There is peritoneal thickening in the lateral left lower abdomen. There is also mild peritoneal thickening along the anterior abdomen and a small amount of ascites. Needle was positioned along the superficial aspect of the left lateral peritoneal cavity. Small amount of intraperitoneal gas at the biopsy site following the procedure. No significant bleeding. No evidence for bowel injury. IMPRESSION: CT-guided biopsy of the peritoneal thickening in the left lateral abdomen. Procedure was technically challenging due to the small amount of peritoneal thickening and close proximity of multiple bowel loops. False negative biopsy is possible. Electronically Signed   By: Markus Daft M.D.   On: 07/11/2017 18:16      Assessment/Plan Hx of MI with stent Carcinomatosis - s/p IR peritoneal biopsy 03/22  SBO seen on CT with transition point in RLQ - SBO protocol, NGT, NPO, ice chips okay - medicine admit  - we will follow  Kalman Drape, [facility] Surgery 07/12/2017, 8:21 AM Pager: [phone] Consults: [phone] Mon-Fri 7:00 am-4:30 pm Sat-Sun 7:00 am-11:30 am

## 2017-07-12 NOTE — H&P (Signed)
[facility] History and Physical  Carol Ferguson NWG:[id number] DOB: 07-Mar-1951 DOA: 07/12/2017  Referring physician:  PCP: Carol Douglas, PA-C   Chief Complaint: "It just hurts."  HPI: Carol Ferguson is a 67 y.o. female past medical history significant for coronary artery disease, depression, cardiomyopathy, tobacco abuse and recent CT-guided biopsy of the pelvis.  Patient states she had problems over the last 24 hours.  Abdominal pain around where her needle biopsy site is.  Rates pain is moderate to severe.  Abdomen somewhat distended.  No history of bowel obstruction.  Course: Abdominal exam by EDP concern for possible perforation.  CT abdomen pelvis ordered.  Scan showed likely ovarian mass.  Gas also seen.  General surgery consulted and they felt that air seen on film was related to the recent procedure.  Hospitalist consulted for admission.   Review of Systems:  As per HPI otherwise 10 point review of systems negative.    Past Medical History:  Diagnosis Date  . Anxiety   . 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 CAD (coronary artery disease)    a. Inf STEMI/LHC 09/2016 which showed occluded mid LCx treated with PCI, DES; remaining cath details included a proximal to mid LAD lesion of 20% stenosis, and proximal to mid RCA 25% stenosis. EF 55-60%.  . Depression   . Diverticulosis   . Gallstones   . Ischemic cardiomyopathy    a. 2D Echo 09/28/16 showed EF 55-60%, probable severe hypokinesis of the entire inferiormyocardium, normal diastolic parameters.  . Myocardial infarction ([place]) 09/26/2016  . Pneumonia   . Presence of tooth-root and mandibular implants   . Tobacco abuse   . Wears glasses    Past Surgical History:  Procedure Laterality Date  . CERVICAL CONIZATION W/BX N/A 01/28/2017   Procedure: CONIZATION CERVIX WITH BIOPSY;  Surgeon: [name], MD;  Location: [facility];  Service: Gynecology;  Laterality: N/A;  . CHOLECYSTECTOMY    .  CORONARY STENT INTERVENTION N/A 09/26/2016   Procedure: Coronary Stent Intervention;  Surgeon: Belva Crome, MD;  Location: [facility];  Service: Cardiovascular;  Laterality: N/A;  . DILATION AND CURETTAGE OF UTERUS N/A 01/28/2017   Procedure: DILATATION AND CURETTAGE WITH ULTRASOUND GUIDENCE;  Surgeon: [name], MD;  Location: [facility];  Service: Gynecology;  Laterality: N/A;  . LEFT HEART CATH AND CORONARY ANGIOGRAPHY N/A 09/26/2016   Procedure: Left Heart Cath and Coronary Angiography;  Surgeon: Belva Crome, MD;  Location: [facility];  Service: Cardiovascular;  Laterality: N/A;   Social History:  reports that she quit smoking about 9 months ago. Her smoking use included cigarettes. She has a 35.00 pack-year smoking history. She has never used smokeless tobacco. She reports that she drinks alcohol. She reports that she does not use drugs.  Allergies  Allergen Reactions  . Flagyl [Metronidazole] Nausea Only    DIARRHEA    Family History  Problem Relation Age of Onset  . Cancer Brother        MELANOMA  . Heart disease Father   . Stroke Father   . Heart disease Brother   . Diabetes Brother   . Heart attack Brother   . Heart failure Brother      Prior to Admission medications   Medication Sig Start Date End Date Taking? Authorizing Provider  aspirin 81 MG chewable tablet Chew 1 tablet (81 mg total) by mouth daily. 09/28/16  Yes Dunn,  Ryan M, PA-C  atorvastatin (LIPITOR) 10 MG tablet Take 1 tablet (10 mg total) by mouth daily. 12/02/16 07/12/24 Yes Belva Crome, MD  BRILINTA 90 MG TABS tablet TAKE 1 TABLET BY MOUTH TWICE A DAY 10/25/16  Yes Dunn, Dayna N, PA-C  dicyclomine (BENTYL) 10 MG capsule TAKE 1 CAPSULE BY MOUTH EVERY 8 HOURS AS NEEDED FOR SPASMS. FOR ABDOMINAL DISCOMFORT 06/20/17  Yes Mauri Pole, MD  FLUoxetine (PROZAC) 10 MG capsule TAKE 1 CAPSULE BY MOUTH EVERY DAY 04/28/17  Yes Timmothy Euler, [place] D, PA-C  nitroGLYCERIN (NITROSTAT) 0.4 MG  SL tablet Place 1 tablet (0.4 mg total) under the tongue every 5 (five) minutes x 3 doses as needed for chest pain. 09/28/16  Yes Dunn, Areta Haber, PA-C  omeprazole (PRILOSEC) 20 MG capsule Take 20 mg by mouth daily as needed.    Yes [provider]  ondansetron (ZOFRAN) 4 MG tablet Take 1 tablet (4 mg total) by mouth every 8 (eight) hours as needed for nausea or vomiting. 04/18/17  Yes Nandigam, Venia Minks, MD  traZODone (DESYREL) 50 MG tablet Take 0.5-1 tablets (25-50 mg total) at bedtime as needed by mouth for sleep. 03/06/17  Yes Carol Douglas, PA-C   Physical Exam: Vitals:   07/12/17 0700 07/12/17 0718 07/12/17 0730 07/12/17 0812  BP: (!) 108/49 (!) 98/53 (!) 105/56 105/68  Pulse:  83 71 72  Resp: 13 20 (!) 21 20  Temp:      TempSrc:      SpO2: 97% 100% 100% 98%  Weight:      Height:        Wt Readings from Last 3 Encounters:  07/12/17 58.1 kg (128 lb)  07/11/17 58.1 kg (128 lb)  07/02/17 58.1 kg (128 lb)    General:  Appears calm and comfortable; A&Ox3 Eyes:  PERRL, EOMI, normal lids, iris ENT:  grossly normal hearing, lips & tongue Neck:  no LAD, masses or thyromegaly Cardiovascular:  RRR, no m/r/g. No LE edema.  Respiratory:  CTA bilaterally, no w/r/r. Normal respiratory effort. Abdomen:  soft, diff tender but worse over biopsy site, distended Skin:  no rash or induration seen on limited exam Musculoskeletal:  grossly normal tone BUE/BLE Psychiatric:  grossly normal mood and affect, speech fluent and appropriate Neurologic:  CN 2-12 grossly intact, moves all extremities in coordinated fashion.          Labs on Admission:  Basic Metabolic Panel: Recent Labs  Lab 07/12/17 0436  NA 135  K 3.8  CL 102  CO2 22  GLUCOSE 154*  BUN 19  CREATININE 0.85  CALCIUM 8.6*   Liver Function Tests: Recent Labs  Lab 07/12/17 0436  AST 19  ALT 13*  ALKPHOS 85  BILITOT 0.6  PROT 6.1*  ALBUMIN 3.2*   Recent Labs  Lab 07/12/17 0436  LIPASE 25   No results  for input(s): AMMONIA in the last 168 hours. CBC: Recent Labs  Lab 07/11/17 1003 07/12/17 0436  WBC 5.8 8.1  NEUTROABS 3.5  --   HGB 14.6 14.2  HCT 42.9 41.8  MCV 98.2 97.4  PLT 221 215   Cardiac Enzymes: No results for input(s): CKTOTAL, CKMB, CKMBINDEX, TROPONINI in the last 168 hours.  BNP (last 3 results) No results for input(s): BNP in the last 8760 hours.  ProBNP (last 3 results) No results for input(s): PROBNP in the last 8760 hours.   Serum creatinine: 0.85 mg/dL 07/12/17 0436 Estimated creatinine clearance: 56.2 mL/min  CBG: No  results for input(s): GLUCAP in the last 168 hours.  Radiological Exams on Admission: Ct Abdomen Pelvis W Contrast  Result Date: 07/12/2017 CLINICAL DATA:  Abdominal pain with nausea and vomiting. Percutaneous peritoneal implant biopsy yesterday. EXAM: CT ABDOMEN AND PELVIS WITH CONTRAST TECHNIQUE: Multidetector CT imaging of the abdomen and pelvis was performed using the standard protocol following bolus administration of intravenous contrast. CONTRAST:  159mL ISOVUE-300 IOPAMIDOL (ISOVUE-300) INJECTION 61% COMPARISON:  CT abdomen pelvis dated June 27, 2017. FINDINGS: Lower chest: No acute abnormality. Hepatobiliary: No focal liver abnormality is seen. Status post cholecystectomy. No biliary dilatation. Pancreas: Unremarkable. No pancreatic ductal dilatation or surrounding inflammatory changes. Spleen: Normal in size without focal abnormality. Adrenals/Urinary Tract: The adrenal glands are unremarkable. Stable left renal cyst. No renal or ureteral calculi. The bladder is unremarkable. Stomach/Bowel: There are new multiple dilated loops of small bowel with a transition point seen in the right lower quadrant (series 3, image 52). The stomach is unremarkable. The colon is decompressed. Normal appendix. Vascular/Lymphatic: Aortic atherosclerosis. No enlarged abdominal or pelvic lymph nodes. Reproductive: Mixed cystic and solid mass in the left ovary is  unchanged. The uterus and right ovary are unremarkable. Other: Slight interval increase in intra-abdominal ascites. Omental nodularity in the left lower quadrant and right abdomen is largely unchanged. There are a few foci of air around the left lower quadrant peritoneal nodularity at the site of biopsy. Musculoskeletal: Postprocedure changes in the left lower anterior abdominal wall. No acute or suspicious osseous findings. IMPRESSION: 1. New small bowel obstruction with transition point seen in the right lower quadrant, possibly secondary to adhesions related to peritoneal carcinomatosis. No pneumatosis. 2. Findings suggestive of metastatic ovarian cancer. Slightly worsened abdominal ascites may be related to metastatic disease or reactive to the small-bowel obstruction. 3. Expected post biopsy changes in the left lower quadrant adjacent to peritoneal nodularity. No intra-abdominal hemorrhage. Electronically Signed   By: Titus Dubin M.D.   On: 07/12/2017 07:34   Ct Biopsy  Result Date: 07/11/2017 INDICATION: 67 year old with spindle cell neoplasm of the cervix and evidence for peritoneal carcinomatosis. There is also concern for ovarian lesions. EXAM: CT-GUIDED PERITONEAL BIOPSY MEDICATIONS: None. ANESTHESIA/SEDATION: Moderate (conscious) sedation was employed during this procedure. A total of Versed 4.0 mg and Fentanyl 200 mcg was administered intravenously. Moderate Sedation Time: 44 minutes. The patient's level of consciousness and vital signs were monitored continuously by radiology nursing throughout the procedure under my direct supervision. FLUOROSCOPY TIME:  None COMPLICATIONS: None immediate. PROCEDURE: Informed written consent was obtained from the patient after a thorough discussion of the procedural risks, benefits and alternatives. All questions were addressed. A timeout was performed prior to the initiation of the procedure. Patient was placed supine on CT scanner. Images through the abdomen  were obtained. The peritoneal thickening in the left lower abdomen was targeted. The anterior and left side of the abdomen was prepped with chlorhexidine and a sterile field was created. Skin was anesthetized with 1% lidocaine. 17 gauge coaxial needle was directed towards this peritoneal thickening from medial to lateral approach with CT guidance. Needle was repositioned multiple times. It was very difficult to position the coaxial needle within the peritoneal thickening without risking bowel injury. Eventually, the needle was felt to be safely along the periphery of the peritoneal cavity. A total of 3 core biopsies were obtained with an 18 core device. Tiny pieces of tissue were collected and placed in formalin. Needle was removed without complication. FINDINGS: There is peritoneal thickening in the lateral  left lower abdomen. There is also mild peritoneal thickening along the anterior abdomen and a small amount of ascites. Needle was positioned along the superficial aspect of the left lateral peritoneal cavity. Small amount of intraperitoneal gas at the biopsy site following the procedure. No significant bleeding. No evidence for bowel injury. IMPRESSION: CT-guided biopsy of the peritoneal thickening in the left lateral abdomen. Procedure was technically challenging due to the small amount of peritoneal thickening and close proximity of multiple bowel loops. False negative biopsy is possible. Electronically Signed   By: Markus Daft M.D.   On: 07/11/2017 18:16   Dg Abd Portable 1v-small Bowel Protocol-position Verification  Result Date: 07/12/2017 CLINICAL DATA:  Nasogastric tube placement EXAM: PORTABLE ABDOMEN - 1 VIEW COMPARISON:  Portable exam 0846 hours compared to CT abdomen and pelvis 07/12/2017 FINDINGS: Tip of nasogastric tube projects over proximal to mid stomach. Dilated small bowel loops in mid abdomen. Excreted contrast material within nondilated renal collecting systems and a partially distended  urinary bladder. Bones demineralized. IMPRESSION: Tip of nasogastric tube projects over proximal to mid stomach. Electronically Signed   By: Lavonia Dana M.D.   On: 07/12/2017 09:23    EKG: no new  Assessment/Plan Principal Problem:   SBO (small bowel obstruction) (HCC) Active Problems:   Chronic diastolic heart failure (HCC)   Tobacco abuse   Hyperlipidemia with target LDL less than 70   CAD (coronary artery disease), native coronary artery  SBO NPO NGT low int suction IVF Prn fentanyl for pain Prn hydralazine for high bp  Low BP Will monitor Responded to bolus in ED  CAD Cont asa as supp 300mg , patient vomiting, NG tube down Hold brilinta Prn ntg sl  Hyperlipidemia Continue statin  Abd spasm Hold bentyl  Depression Hold prozac  GERD  PPI IV   Code Status: FC  DVT Prophylaxis: SCDs Family Communication: nephew at bedside Disposition Plan: Pending Improvement  Status: Inpt Tele  Elwin Mocha, MD Family Medicine [facility] [email or web address] Password [email or web address]

## 2017-07-12 NOTE — ED Provider Notes (Signed)
[place] EMERGENCY DEPARTMENT Provider Note   CSN: [id number] Arrival date & time: 07/12/17  0415     History   Chief Complaint Chief Complaint  Patient presents with  . Abdominal Pain    HPI Carol Ferguson is a 67 y.o. female with a hx of anxiety, arthritis, CAD (STEMI 09/2016), diverticulosis presents to the Emergency Department complaining of gradual, persistent, progressively worsening lower abd pain onset 10pm last night. Associated symptoms include persistent vomiting and near syncope onset 1am this morning.  Pt reports she had a biopsy of her left ovary yesterday around 1pm in an effort to diagnose a potential ovarian cancer.  Record review shows patient had CT-guided biopsy of peritoneal thickening in the left lower abdomen.  Dr. Anselm Pancoast reports this was a very challenging biopsy due to small amount of disease and location of bowel.  Core biopsies were obtained but only scant material was obtained with minimal blood loss and no immediate complications.  Patient reports she felt well upon discharge from her biopsy.  Patient reports that movement and palpation make her pain significantly worse.  Nothing seems to make it better.  She denies fevers, chills, headache, neck pain, chest pain, shortness of breath, dysuria. Pt has been off her Brilinta for 6 days.    The history is provided by the patient and medical records. No language interpreter was used.    Past Medical History:  Diagnosis Date  . Anxiety   . 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 CAD (coronary artery disease)    a. Inf STEMI/LHC 09/2016 which showed occluded mid LCx treated with PCI, DES; remaining cath details included a proximal to mid LAD lesion of 20% stenosis, and proximal to mid RCA 25% stenosis. EF 55-60%.  . Depression   . Diverticulosis   . Gallstones   . Ischemic cardiomyopathy    a. 2D Echo 09/28/16 showed EF 55-60%, probable severe hypokinesis of the entire inferiormyocardium,  normal diastolic parameters.  . Myocardial infarction ([place]) 09/26/2016  . Pneumonia   . Presence of tooth-root and mandibular implants   . Tobacco abuse   . Wears glasses     Patient Active Problem List   Diagnosis Date Noted  . Left tubo-ovarian mass 07/02/2017  . Peritoneal carcinomatosis ([place]) 07/02/2017  . Diverticulosis 01/10/2017  . Cervical mass 12/27/2016  . CAD (coronary artery disease), native coronary artery 11/27/2016  . Chronic diastolic heart failure ([place]) 09/27/2016  . Tobacco abuse 09/27/2016  . Hyperlipidemia with target LDL less than 70 09/27/2016  . 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 Lumbar degenerative disc disease 06/14/2016    Past Surgical History:  Procedure Laterality Date  . CERVICAL CONIZATION W/BX N/A 01/28/2017   Procedure: CONIZATION CERVIX WITH BIOPSY;  Surgeon: [name], MD;  Location: [facility];  Service: Gynecology;  Laterality: N/A;  . CHOLECYSTECTOMY    . CORONARY STENT INTERVENTION N/A 09/26/2016   Procedure: Coronary Stent Intervention;  Surgeon: Belva Crome, MD;  Location: [facility];  Service: Cardiovascular;  Laterality: N/A;  . DILATION AND CURETTAGE OF UTERUS N/A 01/28/2017   Procedure: DILATATION AND CURETTAGE WITH ULTRASOUND GUIDENCE;  Surgeon: [name], MD;  Location: [place];  Service: Gynecology;  Laterality: N/A;  . LEFT HEART CATH AND CORONARY ANGIOGRAPHY N/A 09/26/2016   Procedure: Left Heart Cath and Coronary Angiography;  Surgeon: Belva Crome, MD;  Location: [facility];  Service: Cardiovascular;  Laterality: N/A;     OB History    Gravida  0   Para  0   Term  0   Preterm  0   AB  0   Living  0     SAB  0   TAB  0   Ectopic  0   Multiple  0   Live Births  0            Home Medications    Prior to Admission medications   Medication Sig Start Date End Date Taking? Authorizing Provider  aspirin 81 MG chewable tablet  Chew 1 tablet (81 mg total) by mouth daily. 09/28/16  Yes Dunn, Areta Haber, PA-C  atorvastatin (LIPITOR) 10 MG tablet Take 1 tablet (10 mg total) by mouth daily. 12/02/16 07/12/24 Yes Belva Crome, MD  BRILINTA 90 MG TABS tablet TAKE 1 TABLET BY MOUTH TWICE A DAY 10/25/16  Yes Dunn, Dayna N, PA-C  dicyclomine (BENTYL) 10 MG capsule TAKE 1 CAPSULE BY MOUTH EVERY 8 HOURS AS NEEDED FOR SPASMS. FOR ABDOMINAL DISCOMFORT 06/20/17  Yes Mauri Pole, MD  FLUoxetine (PROZAC) 10 MG capsule TAKE 1 CAPSULE BY MOUTH EVERY DAY 04/28/17  Yes Timmothy Euler, [place] D, PA-C  nitroGLYCERIN (NITROSTAT) 0.4 MG SL tablet Place 1 tablet (0.4 mg total) under the tongue every 5 (five) minutes x 3 doses as needed for chest pain. 09/28/16  Yes Dunn, Areta Haber, PA-C  omeprazole (PRILOSEC) 20 MG capsule Take 20 mg by mouth daily as needed.    Yes [provider]  ondansetron (ZOFRAN) 4 MG tablet Take 1 tablet (4 mg total) by mouth every 8 (eight) hours as needed for nausea or vomiting. 04/18/17  Yes Nandigam, Venia Minks, MD  traZODone (DESYREL) 50 MG tablet Take 0.5-1 tablets (25-50 mg total) at bedtime as needed by mouth for sleep. 03/06/17  Yes [name], PA-C    Family History Family History  Problem Relation Age of Onset  . Cancer Brother        MELANOMA  . Heart disease Father   . Stroke Father   . Heart disease Brother   . Diabetes Brother   . Heart attack Brother   . Heart failure Brother     Social History Social History   Tobacco Use  . Smoking status: Former Smoker    Packs/day: 1.00    Years: 35.00    Pack years: 35.00    Types: Cigarettes    Last attempt to quit: 09/26/2016    Years since quitting: 0.7  . Smokeless tobacco: Never Used  Substance Use Topics  . Alcohol use: Yes    Comment: occasional  . Drug use: No     Allergies   Flagyl [metronidazole]   Review of Systems Review of Systems  Constitutional: Negative for appetite change, diaphoresis, fatigue, fever and unexpected  weight change.  HENT: Negative for mouth sores.   Eyes: Negative for visual disturbance.  Respiratory: Negative for cough, chest tightness, shortness of breath and wheezing.   Cardiovascular: Negative for chest pain.  Gastrointestinal: Positive for abdominal pain, nausea and vomiting. Negative for constipation and diarrhea.  Endocrine: Negative for polydipsia, polyphagia and polyuria.  Genitourinary: Negative for dysuria, frequency, hematuria and urgency.  Musculoskeletal: Negative for back pain and neck stiffness.  Skin: Negative for rash.  Allergic/Immunologic: Negative for immunocompromised state.  Neurological: Positive for syncope (near syncope) and light-headedness. Negative for headaches.  Hematological: Does not bruise/bleed easily.  Psychiatric/Behavioral:  Negative for sleep disturbance. The patient is not nervous/anxious.      Physical Exam Updated Vital Signs BP (!) 92/53   Pulse 69   Temp (!) 97.3 F (36.3 C) (Oral)   Resp 20   Ht 5\' 5"  (1.651 m)   Wt 58.1 kg (128 lb)   SpO2 98%   BMI 21.30 kg/m   Physical Exam  Constitutional: She appears well-developed and well-nourished. She appears distressed.  Awake, alert, nontoxic appearance  HENT:  Head: Normocephalic and atraumatic.  Mouth/Throat: Mucous membranes are dry.  Eyes: Conjunctivae are normal. No scleral icterus.  Neck: Normal range of motion. Neck supple.  Cardiovascular: Normal rate, regular rhythm and intact distal pulses.  Pulses:      Radial pulses are 1+ on the right side, and 1+ on the left side.  Weak radial pulses  Pulmonary/Chest: Effort normal and breath sounds normal. No respiratory distress. She has no wheezes.  Equal chest expansion  Abdominal: She exhibits no mass. Bowel sounds are decreased. There is generalized tenderness. There is rigidity, rebound and guarding.  Patient abdomen with severe tenderness, rigidity, rebound and guarding.  Musculoskeletal: Normal range of motion. She exhibits  no edema.  Neurological: She is alert.  Speech is clear and goal oriented Moves extremities without ataxia  Skin: Skin is warm and dry. She is not diaphoretic. There is pallor.  Psychiatric: She has a normal mood and affect.  Nursing note and vitals reviewed.    ED Treatments / Results  Labs (all labs ordered are listed, but only abnormal results are displayed) Labs Reviewed  COMPREHENSIVE METABOLIC PANEL - Abnormal; Notable for the following components:      Result Value   Glucose, Bld 154 (*)    Calcium 8.6 (*)    Total Protein 6.1 (*)    Albumin 3.2 (*)    ALT 13 (*)    All other components within normal limits  URINALYSIS, ROUTINE W REFLEX MICROSCOPIC - Abnormal; Notable for the following components:   Hgb urine dipstick SMALL (*)    Squamous Epithelial / LPF 0-5 (*)    All other components within normal limits  CBC  LIPASE, BLOOD  PROTIME-INR  I-STAT CG4 LACTIC ACID, ED  I-STAT CG4 LACTIC ACID, ED      Procedures Procedures (including critical care time)  Medications Ordered in ED Medications  sodium chloride 0.9 % bolus 1,000 mL (1,000 mLs Intravenous New Bag/Given 07/12/17 0442)  ondansetron (ZOFRAN) injection 4 mg (4 mg Intravenous Given 07/12/17 0442)  piperacillin-tazobactam (ZOSYN) IVPB 3.375 g (0 g Intravenous Stopped 07/12/17 0605)  iopamidol (ISOVUE-300) 61 % injection (100 mLs  Contrast Given 07/12/17 8366)     Initial Impression / Assessment and Plan / ED Course  I have reviewed the triage vital signs and the nursing notes.  Pertinent labs & imaging results that were available during my care of the patient were reviewed by me and considered in my medical decision making (see chart for details).  Clinical Course as of Jul 13 714  Sat Jul 12, 2017  0706 No Leukocytosis  WBC: 8.1 [HM]  0707 Lactic acid normal  Lactic Acid, Venous: 1.75 [HM]  0707 Patient now tachycardia  Pulse Rate(!): 102 [HM]    Clinical Course User Index [HM] Ambria Mayfield,  Jarrett Soho, PA-C    Patient presents with nausea, vomiting and surgical abdomen less than 24 hours after CT-guided biopsy of the peritoneum in her left lower quadrant.  Patient arrives hypotensive without tachycardia.  She has  been given fluids.  Blood pressure has improved somewhat however she is now tachycardic.  She is afebrile however with digital complications from biopsy Zosyn was started.  She does not currently meet sepsis criteria.  No evidence of UTI.  CT scan pending.  7:13 AM At shift change care was transferred to [facility] who will follow pending studies, re-evaulate and determine disposition.    Final Clinical Impressions(s) / ED Diagnoses   Final diagnoses:  Generalized abdominal pain  Hypotension, unspecified hypotension type  Non-intractable vomiting with nausea, unspecified vomiting type    ED Discharge Orders    None       Agapito Games 07/12/17 7680    Jola Schmidt, MD 07/12/17 [id number]

## 2017-07-13 DIAGNOSIS — K56609 Unspecified intestinal obstruction, unspecified as to partial versus complete obstruction: Secondary | ICD-10-CM

## 2017-07-13 LAB — CBC
HCT: 39.3 % (ref 36.0–46.0)
HEMOGLOBIN: 13.1 g/dL (ref 12.0–15.0)
MCH: 32.7 pg (ref 26.0–34.0)
MCHC: 33.3 g/dL (ref 30.0–36.0)
MCV: 98 fL (ref 78.0–100.0)
Platelets: 220 10*3/uL (ref 150–400)
RBC: 4.01 MIL/uL (ref 3.87–5.11)
RDW: 13.1 % (ref 11.5–15.5)
WBC: 7.5 10*3/uL (ref 4.0–10.5)

## 2017-07-13 LAB — BASIC METABOLIC PANEL
ANION GAP: 8 (ref 5–15)
BUN: 7 mg/dL (ref 6–20)
CHLORIDE: 105 mmol/L (ref 101–111)
CO2: 27 mmol/L (ref 22–32)
Calcium: 8.3 mg/dL — ABNORMAL LOW (ref 8.9–10.3)
Creatinine, Ser: 0.83 mg/dL (ref 0.44–1.00)
GFR calc non Af Amer: >60 mL/min (ref >60)
Glucose, Bld: 128 mg/dL — ABNORMAL HIGH (ref 65–99)
POTASSIUM: 3.7 mmol/L (ref 3.5–5.1)
Sodium: 140 mmol/L (ref 135–145)

## 2017-07-13 MED ORDER — PHENOL 1.4 % MT LIQD
1.0000 | OROMUCOSAL | Status: DC | PRN
  Administered 2017-07-13: 2 via OROMUCOSAL
  Filled 2017-07-13: qty 177

## 2017-07-13 MED ORDER — MAGIC MOUTHWASH: 15.0000 mL | Freq: Four times a day (QID) | ORAL | Status: DC | PRN

## 2017-07-13 MED ORDER — DIPHENHYDRAMINE HCL 50 MG/ML IJ SOLN: 12.5000 mg | Freq: Four times a day (QID) | INTRAMUSCULAR | Status: DC | PRN

## 2017-07-13 MED ORDER — LACTATED RINGERS IV BOLUS (SEPSIS): 1000.0000 mL | Freq: Three times a day (TID) | INTRAVENOUS | Status: DC | PRN

## 2017-07-13 MED ORDER — GUAIFENESIN-DM 100-10 MG/5ML PO SYRP: 10.0000 mL | ORAL_SOLUTION | ORAL | Status: DC | PRN

## 2017-07-13 MED ORDER — HYDROCORTISONE 2.5 % RE CREA
1.0000 "application " | TOPICAL_CREAM | Freq: Four times a day (QID) | RECTAL | Status: DC | PRN
  Filled 2017-07-13: qty 28.35

## 2017-07-13 MED ORDER — METHOCARBAMOL 1000 MG/10ML IJ SOLN
1000.0000 mg | Freq: Four times a day (QID) | INTRAMUSCULAR | Status: DC | PRN
  Filled 2017-07-13: qty 10

## 2017-07-13 MED ORDER — DIPHENHYDRAMINE HCL 25 MG PO CAPS: 25.0000 mg | ORAL_CAPSULE | Freq: Four times a day (QID) | ORAL | Status: DC | PRN

## 2017-07-13 MED ORDER — MENTHOL 3 MG MT LOZG: 1.0000 | LOZENGE | OROMUCOSAL | Status: DC | PRN

## 2017-07-13 MED ORDER — ACETAMINOPHEN 650 MG RE SUPP: 650.0000 mg | Freq: Four times a day (QID) | RECTAL | Status: DC | PRN

## 2017-07-13 MED ORDER — BLISTEX MEDICATED EX OINT
1.0000 "application " | TOPICAL_OINTMENT | Freq: Two times a day (BID) | CUTANEOUS | Status: DC
  Administered 2017-07-13 – 2017-07-14 (×2): 1 via TOPICAL
  Filled 2017-07-13: qty 6.3

## 2017-07-13 MED ORDER — BISACODYL 10 MG RE SUPP
10.0000 mg | Freq: Every day | RECTAL | Status: DC
  Administered 2017-07-13: 10 mg via RECTAL
  Filled 2017-07-13 (×2): qty 1

## 2017-07-13 MED ORDER — HYDROCORTISONE 1 % EX CREA
1.0000 "application " | TOPICAL_CREAM | Freq: Three times a day (TID) | CUTANEOUS | Status: DC | PRN
  Filled 2017-07-13: qty 28

## 2017-07-13 MED ORDER — METOCLOPRAMIDE HCL 5 MG/ML IJ SOLN: 10.0000 mg | Freq: Four times a day (QID) | INTRAMUSCULAR | Status: DC | PRN

## 2017-07-13 MED ORDER — PROCHLORPERAZINE EDISYLATE 5 MG/ML IJ SOLN: 5.0000 mg | INTRAMUSCULAR | Status: DC | PRN

## 2017-07-13 MED ORDER — ALUM & MAG HYDROXIDE-SIMETH 200-200-20 MG/5ML PO SUSP: 30.0000 mL | Freq: Four times a day (QID) | ORAL | Status: DC | PRN

## 2017-07-13 NOTE — Progress Notes (Signed)
[facility]  [address] [place], [place] [address] Phone: [phone]  FAX: [place] [phone] August 19, 1950  CARE TEAM:  PCP: [name], PA-C  Outpatient Care Team: Patient Care Team: Donzetta Kohut as PCP - General (Physician Assistant) [name], MD as Consulting Physician (Obstetrics and Gynecology) Mauri Pole, MD as Consulting Physician (Gastroenterology) Belva Crome, MD as Consulting Physician (Cardiology)  Inpatient Treatment Team: Treatment Team: Attending Provider: Shelly Coss, MD; Rounding Team: Edison Pace, Md, MD; Registered Nurse: Candida Peeling, RN; Technician: Raenette Rover, NT; Registered Nurse: Roselind Rily, RN; Rounding Team: Briscoe Deutscher, MD; Technician: Duard Brady, NT; Consulting Physician: Markus Daft, MD   Problem List:   Principal Problem:   SBO (small bowel obstruction) [facility]) Active Problems:   Peritoneal carcinomatosis ([place])   Chronic diastolic heart failure ([place])   Tobacco abuse   Hyperlipidemia with target LDL less than 70   CAD (coronary artery disease), native coronary artery      * No surgery found *     Assessment  SBO in setting of carcinomatosis  Plan:  -NGT clamping trial w flatus & dec output -f/u Xrays per SBO protocol -path per Dr Denman George   -VTE prophylaxis- SCDs, etc -mobilize as tolerated to help recovery  Hopefully this will open up with nonoperative management but high risk for persistence or recurrence.  Would hold off on any operative intervention without talking to gynecological oncology first.  Medical oncology consultation.  Need for palliative for neoadjuvant chemotherapy.  Operative risks increased for fistula leak recurrence etc. with carcinomatosis.  There is no need for urgent surgery at this time.  She is feeling better.  Guardedly optimistic  25 minutes spent in review, evaluation,  examination, counseling, and coordination of care.  More than 50% of that time was spent in counseling.  Adin Hector, M.D., F.A.C.S. Gastrointestinal and Minimally Invasive Surgery [facility] [address] [place], [place] [address] [phone] Main / Paging   07/13/2017    Subjective: (Chief complaint)  Family in room.  Had vomiting yesterday but feels much better this morning.  Had some flatus and a small bowel movement.  Denies abdominal cramping.  Objective:  Vital signs:  Vitals:   07/12/17 0900 07/12/17 1400 07/12/17 2223 07/13/17 0445  BP: (!) 128/59 (!) 123/50 (!) 113/55 (!) 99/48  Pulse: 67 69 79 76  Resp: 16 17 16 16   Temp: 97.9 F (36.6 C) 98.8 F (37.1 C) 98.9 F (37.2 C) 98.6 F (37 C)  TempSrc: Oral Oral Oral   SpO2: 99% 97% 98% 97%  Weight:      Height:        Last BM Date: 07/10/17  Intake/Output   Yesterday:  03/23 0701 - 03/24 0700 In: 1801.7 [I.V.:1801.7] Out: 1000 [Urine:500; Emesis/NG output:500] This shift:  No intake/output data recorded.  Bowel function:  Flatus: YES  BM:  YES  Drain: Feculent   Physical Exam:  General: Pt awake/alert/oriented x4 in no acute distress Eyes: PERRL, normal EOM.  Sclera clear.  No icterus Neuro: CN II-XII intact w/o focal sensory/motor deficits. Lymph: No head/neck/groin lymphadenopathy Psych:  No delerium/psychosis/paranoia HENT: Normocephalic, Mucus membranes moist.  No thrush Neck: Supple, No tracheal deviation Chest: No chest wall pain w good excursion CV:  Pulses intact.  Regular rhythm MS: Normal AROM mjr joints.  No obvious deformity  Abdomen:  Soft.  Mildy distended.  Nontender.  No evidence of peritonitis.  No incarcerated hernias.  Ext:  No deformity.  No mjr edema.  No cyanosis Skin: No petechiae / purpura  Results:   Labs: Results for orders placed or performed during the hospital encounter of 07/12/17 (from the past 48 hour(s))  CBC      Status: None   Collection Time: 07/12/17  4:36 AM  Result Value Ref Range   WBC 8.1 4.0 - 10.5 K/uL   RBC 4.29 3.87 - 5.11 MIL/uL   Hemoglobin 14.2 12.0 - 15.0 g/dL   HCT 41.8 36.0 - 46.0 %   MCV 97.4 78.0 - 100.0 fL   MCH 33.1 26.0 - 34.0 pg   MCHC 34.0 30.0 - 36.0 g/dL   RDW 12.9 11.5 - 15.5 %   Platelets 215 150 - 400 K/uL    Comment: Performed at [facility], [place] [address] [place], [place] [address]  Comprehensive metabolic panel     Status: Abnormal   Collection Time: 07/12/17  4:36 AM  Result Value Ref Range   Sodium 135 135 - 145 mmol/L   Potassium 3.8 3.5 - 5.1 mmol/L   Chloride 102 101 - 111 mmol/L   CO2 22 22 - 32 mmol/L   Glucose, Bld 154 (H) 65 - 99 mg/dL   BUN 19 6 - 20 mg/dL   Creatinine, Ser 0.85 0.44 - 1.00 mg/dL   Calcium 8.6 (L) 8.9 - 10.3 mg/dL   Total Protein 6.1 (L) 6.5 - 8.1 g/dL   Albumin 3.2 (L) 3.5 - 5.0 g/dL   AST 19 15 - 41 U/L   ALT 13 (L) 14 - 54 U/L   Alkaline Phosphatase 85 38 - 126 U/L   Total Bilirubin 0.6 0.3 - 1.2 mg/dL   GFR calc non Af Amer >60 >60 mL/min   GFR calc Af Amer >60 >60 mL/min    Comment: (NOTE) The eGFR has been calculated using the CKD EPI equation. This calculation has not been validated in all clinical situations. eGFR's persistently <60 mL/min signify possible Chronic Kidney Disease.    Anion gap 11 5 - 15    Comment: Performed at [facility] [address] [place], [place] [address]  Lipase, blood     Status: None   Collection Time: 07/12/17  4:36 AM  Result Value Ref Range   Lipase 25 11 - 51 U/L    Comment: Performed at [facility] [address] [place], [place] [address]  Protime-INR     Status: None   Collection Time: 07/12/17  4:52 AM  Result Value Ref Range   Prothrombin Time 14.2 11.4 - 15.2 seconds   INR 1.11     Comment: Performed at [facility] [address] [place], [place] [address]  I-Stat CG4 Lactic Acid, ED     Status: None   Collection Time: 07/12/17   4:54 AM  Result Value Ref Range   Lactic Acid, Venous 1.75 0.5 - 1.9 mmol/L  Urinalysis, Routine w reflex microscopic     Status: Abnormal   Collection Time: 07/12/17  5:40 AM  Result Value Ref Range   Color, Urine YELLOW YELLOW   APPearance CLEAR CLEAR   Specific Gravity, Urine 1.009 1.005 - 1.030   pH 7.0 5.0 - 8.0   Glucose, UA NEGATIVE NEGATIVE mg/dL   Hgb urine dipstick SMALL (A) NEGATIVE   Bilirubin Urine NEGATIVE NEGATIVE   Ketones,  ur NEGATIVE NEGATIVE mg/dL   Protein, ur NEGATIVE NEGATIVE mg/dL   Nitrite NEGATIVE NEGATIVE   Leukocytes, UA NEGATIVE NEGATIVE   RBC / HPF 0-5 0 - 5 RBC/hpf   WBC, UA 6-30 0 - 5 WBC/hpf   Bacteria, UA NONE SEEN NONE SEEN   Squamous Epithelial / LPF 0-5 (A) NONE SEEN   Mucus PRESENT     Comment: Performed at [facility], [place] [address] [place], [place] [address]  Basic metabolic panel     Status: Abnormal   Collection Time: 07/13/17  5:24 AM  Result Value Ref Range   Sodium 140 135 - 145 mmol/L   Potassium 3.7 3.5 - 5.1 mmol/L   Chloride 105 101 - 111 mmol/L   CO2 27 22 - 32 mmol/L   Glucose, Bld 128 (H) 65 - 99 mg/dL   BUN 7 6 - 20 mg/dL   Creatinine, Ser 0.83 0.44 - 1.00 mg/dL   Calcium 8.3 (L) 8.9 - 10.3 mg/dL   GFR calc non Af Amer >60 >60 mL/min   GFR calc Af Amer >60 >60 mL/min    Comment: (NOTE) The eGFR has been calculated using the CKD EPI equation. This calculation has not been validated in all clinical situations. eGFR's persistently <60 mL/min signify possible Chronic Kidney Disease.    Anion gap 8 5 - 15    Comment: Performed at [facility] [address] [place] [address]  CBC     Status: None   Collection Time: 07/13/17  5:24 AM  Result Value Ref Range   WBC 7.5 4.0 - 10.5 K/uL   RBC 4.01 3.87 - 5.11 MIL/uL   Hemoglobin 13.1 12.0 - 15.0 g/dL   HCT 39.3 36.0 - 46.0 %   MCV 98.0 78.0 - 100.0 fL   MCH 32.7 26.0 - 34.0 pg   MCHC 33.3 30.0 - 36.0 g/dL   RDW 13.1 11.5 - 15.5 %   Platelets 220  150 - 400 K/uL    Comment: Performed at [facility], [place] [address], [place], [place] [address]    Imaging / Studies: Ct Abdomen Pelvis W Contrast  Result Date: 07/12/2017 CLINICAL DATA:  Abdominal pain with nausea and vomiting. Percutaneous peritoneal implant biopsy yesterday. EXAM: CT ABDOMEN AND PELVIS WITH CONTRAST TECHNIQUE: Multidetector CT imaging of the abdomen and pelvis was performed using the standard protocol following bolus administration of intravenous contrast. CONTRAST:  120m ISOVUE-300 IOPAMIDOL (ISOVUE-300) INJECTION 61% COMPARISON:  CT abdomen pelvis dated June 27, 2017. FINDINGS: Lower chest: No acute abnormality. Hepatobiliary: No focal liver abnormality is seen. Status post cholecystectomy. No biliary dilatation. Pancreas: Unremarkable. No pancreatic ductal dilatation or surrounding inflammatory changes. Spleen: Normal in size without focal abnormality. Adrenals/Urinary Tract: The adrenal glands are unremarkable. Stable left renal cyst. No renal or ureteral calculi. The bladder is unremarkable. Stomach/Bowel: There are new multiple dilated loops of small bowel with a transition point seen in the right lower quadrant (series 3, image 52). The stomach is unremarkable. The colon is decompressed. Normal appendix. Vascular/Lymphatic: Aortic atherosclerosis. No enlarged abdominal or pelvic lymph nodes. Reproductive: Mixed cystic and solid mass in the left ovary is unchanged. The uterus and right ovary are unremarkable. Other: Slight interval increase in intra-abdominal ascites. Omental nodularity in the left lower quadrant and right abdomen is largely unchanged. There are a few foci of air around the left lower quadrant peritoneal nodularity at the site of biopsy. Musculoskeletal: Postprocedure changes in the left  lower anterior abdominal wall. No acute or suspicious osseous findings. IMPRESSION: 1. New small bowel obstruction with transition point seen in the right lower quadrant,  possibly secondary to adhesions related to peritoneal carcinomatosis. No pneumatosis. 2. Findings suggestive of metastatic ovarian cancer. Slightly worsened abdominal ascites may be related to metastatic disease or reactive to the small-bowel obstruction. 3. Expected post biopsy changes in the left lower quadrant adjacent to peritoneal nodularity. No intra-abdominal hemorrhage. Electronically Signed   By: Titus Dubin M.D.   On: 07/12/2017 07:34   Ct Biopsy  Result Date: 07/11/2017 INDICATION: 67 year old with spindle cell neoplasm of the cervix and evidence for peritoneal carcinomatosis. There is also concern for ovarian lesions. EXAM: CT-GUIDED PERITONEAL BIOPSY MEDICATIONS: None. ANESTHESIA/SEDATION: Moderate (conscious) sedation was employed during this procedure. A total of Versed 4.0 mg and Fentanyl 200 mcg was administered intravenously. Moderate Sedation Time: 44 minutes. The patient's level of consciousness and vital signs were monitored continuously by radiology nursing throughout the procedure under my direct supervision. FLUOROSCOPY TIME:  None COMPLICATIONS: None immediate. PROCEDURE: Informed written consent was obtained from the patient after a thorough discussion of the procedural risks, benefits and alternatives. All questions were addressed. A timeout was performed prior to the initiation of the procedure. Patient was placed supine on CT scanner. Images through the abdomen were obtained. The peritoneal thickening in the left lower abdomen was targeted. The anterior and left side of the abdomen was prepped with chlorhexidine and a sterile field was created. Skin was anesthetized with 1% lidocaine. 17 gauge coaxial needle was directed towards this peritoneal thickening from medial to lateral approach with CT guidance. Needle was repositioned multiple times. It was very difficult to position the coaxial needle within the peritoneal thickening without risking bowel injury. Eventually, the needle  was felt to be safely along the periphery of the peritoneal cavity. A total of 3 core biopsies were obtained with an 18 core device. Tiny pieces of tissue were collected and placed in formalin. Needle was removed without complication. FINDINGS: There is peritoneal thickening in the lateral left lower abdomen. There is also mild peritoneal thickening along the anterior abdomen and a small amount of ascites. Needle was positioned along the superficial aspect of the left lateral peritoneal cavity. Small amount of intraperitoneal gas at the biopsy site following the procedure. No significant bleeding. No evidence for bowel injury. IMPRESSION: CT-guided biopsy of the peritoneal thickening in the left lateral abdomen. Procedure was technically challenging due to the small amount of peritoneal thickening and close proximity of multiple bowel loops. False negative biopsy is possible. Electronically Signed   By: Markus Daft M.D.   On: 07/11/2017 18:16   Dg Abd Portable 1v-small Bowel Obstruction Protocol-initial, 8 Hr Delay  Result Date: 07/12/2017 CLINICAL DATA:  Small bowel obstruction. EXAM: PORTABLE ABDOMEN - 1 VIEW COMPARISON:  Earlier the same day. FINDINGS: NG tube tip is in the stomach. Gaseous distention of small bowel noted in the mid and left upper abdomen with small bowel loops measuring up to 3.7 cm diameter. There is probably some contrast in the right colon although this is not definite. Surgical clips in the right upper quadrant suggest prior cholecystectomy. IMPRESSION: Similar distention of small bowel in the left abdomen measuring up to 3.7 cm diameter. Probable contrast in the right colon although not definite. Electronically Signed   By: Misty Stanley M.D.   On: 07/12/2017 19:44   Dg Abd Portable 1v-small Bowel Protocol-position Verification  Result Date: 07/12/2017 CLINICAL DATA:  Nasogastric tube placement EXAM: PORTABLE ABDOMEN - 1 VIEW COMPARISON:  Portable exam 0846 hours compared to CT  abdomen and pelvis 07/12/2017 FINDINGS: Tip of nasogastric tube projects over proximal to mid stomach. Dilated small bowel loops in mid abdomen. Excreted contrast material within nondilated renal collecting systems and a partially distended urinary bladder. Bones demineralized. IMPRESSION: Tip of nasogastric tube projects over proximal to mid stomach. Electronically Signed   By: Lavonia Dana M.D.   On: 07/12/2017 09:23    Medications / Allergies: per chart  Antibiotics: Anti-infectives (From admission, onward)   Start     Dose/Rate Route Frequency Ordered Stop   07/12/17 0515  piperacillin-tazobactam (ZOSYN) IVPB 3.375 g     3.375 g 100 mL/hr over 30 Minutes Intravenous  Once 07/12/17 0509 07/12/17 5790        Note: Portions of this report may have been transcribed using voice recognition software. Every effort was made to ensure accuracy; however, inadvertent computerized transcription errors may be present.   Any transcriptional errors that result from this process are unintentional.     Adin Hector, M.D., F.A.C.S. Gastrointestinal and Minimally Invasive Surgery [facility] [address], [place], [place] [address] [phone] Main / Paging   07/13/2017

## 2017-07-13 NOTE — Progress Notes (Addendum)
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 CT scan on 06/27/17 showed  new 7.5 cm left adnexal mass with worsening abdominal ascites and omental nodularity suggesting metastatic ovarian cancer.  OB/GYN was following her.  Patient underwent omental biopsy at the request of OB/GYN on 07/11/17.  Plan was to follow-up the biopsy report by OB/GYNarrange appointment with [name] as an outpatient as soon as the pathology is available. Patient presents to the emergency department with vomiting, abdominal pain.  There was concern of microperforation from CT-guided biopsy and CT imaging  showed new bowel obstruction.  General surgery consulted.  Assessment & Plan:   Principal Problem:   SBO (small bowel obstruction) (HCC) Active Problems:   Chronic diastolic heart failure (HCC)   Tobacco abuse   Hyperlipidemia with target LDL less than 70   CAD (coronary artery disease), native coronary artery   Peritoneal carcinomatosis ([place])   SBO: General surgery following.  Was started on conservative management with NG tube.Clamped now.  Patient has been started on clear liquid diet. Continue pain management and IV fluids. Abdomen pain has much improved.  Patient had 2 bowel movements today. There was also concern for bowel perforation but it has been ruled out and surgery has not plan for any intervention. Encourage ambulation.  Possible ovarian cancer: Will follow up biopsy. Oncology referral depends upon it. OB/GYN also aware for the biopsy follow up.CA 125 was drawn on 06/27/17 and  was elevated at 90. CEA was 4.5. 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  Coronary artery disease: Continue aspirin suppository 300 mg.  Patient was on Brilinta at home which was held for last few days most likely for biopsy. We will resume on DC. Patient denies any chest pain.Follow up with cardiology as an outpatient.  Hyperlipidemia: Continue statin   GERD: Continue PPI  History of depression: On Prozac   DVT prophylaxis: ScD Code Status: Full Family Communication: None present at the bedside Disposition Plan:Home after resolution of SBO   Consultants: General surgery  Procedures: None  Antimicrobials: None  Subjective: Patient seen and examined the bedside today.  Appears comfortable.  Denies any abdominal pain at present.  Had 2 bowel movements  Objective: Vitals:   07/12/17 1400 07/12/17 2223 07/13/17 0445 07/13/17 1418  BP: (!) 123/50 (!) 113/55 (!) 99/48 (!) 113/47  Pulse: 69 79 76 63  Resp: 17 16 16 18   Temp: 98.8 F (37.1 C) 98.9 F (37.2 C) 98.6 F (37 C) 98.1 F (36.7 C)  TempSrc: Oral Oral  Oral  SpO2: 97% 98% 97% 100%  Weight:      Height:        Intake/Output Summary (Last 24 hours) at 07/13/2017 1444 Last data filed at 07/13/2017 0650 Gross per 24 hour  Intake 1801.66 ml  Output 1000 ml  Net 801.66 ml   Filed Weights   07/12/17 0420  Weight: 58.1 kg (128 lb)    Examination:  General exam: Appears calm and comfortable ,Not in distress,average built HEENT:PERRL,Oral mucosa moist, Ear/Nose normal on gross exam Respiratory system:  Bilateral equal air entry, normal vesicular breath sounds, no wheezes or crackles  Cardiovascular system: S1 & S2 heard, RRR. No JVD, murmurs, rubs, gallops or clicks. No pedal edema. Gastrointestinal system: Abdomen is nondistended, soft and nontender. No organomegaly or masses felt. Normal bowel sounds heard.NG tube Central nervous system: Alert and  oriented. No focal neurological deficits. Extremities: No edema, no clubbing ,no cyanosis, distal peripheral pulses palpable. Skin: No rashes, lesions or ulcers,no icterus ,no pallor MSK: Normal muscle bulk,tone ,power Psychiatry: Judgement and insight appear normal. Mood & affect appropriate.     Data Reviewed: I have personally reviewed following labs and imaging studies  CBC: Recent Labs  Lab 07/11/17 1003 07/12/17 0436 07/13/17 0524  WBC 5.8 8.1 7.5  NEUTROABS 3.5  --   --   HGB 14.6 14.2 13.1  HCT 42.9 41.8 39.3  MCV 98.2 97.4 98.0  PLT 221 215 938   Basic Metabolic Panel: Recent Labs  Lab 07/12/17 0436 07/13/17 0524  NA 135 140  K 3.8 3.7  CL 102 105  CO2 22 27  GLUCOSE 154* 128*  BUN 19 7  CREATININE 0.85 0.83  CALCIUM 8.6* 8.3*   GFR: Estimated Creatinine Clearance: 57.6 mL/min (by C-G formula based on SCr of 0.83 mg/dL). Liver Function Tests: Recent Labs  Lab 07/12/17 0436  AST 19  ALT 13*  ALKPHOS 85  BILITOT 0.6  PROT 6.1*  ALBUMIN 3.2*   Recent Labs  Lab 07/12/17 0436  LIPASE 25   No results for input(s): AMMONIA in the last 168 hours. Coagulation Profile: Recent Labs  Lab 07/11/17 1115 07/12/17 0452  INR 1.12 1.11   Cardiac Enzymes: No results for input(s): CKTOTAL, CKMB, CKMBINDEX, TROPONINI in the last 168 hours. BNP (last 3 results) No results for input(s): PROBNP in the last 8760 hours. HbA1C: No results for input(s): HGBA1C in the last 72 hours. CBG: No results for input(s): GLUCAP in the last 168 hours. Lipid Profile: No results for input(s): CHOL, HDL, LDLCALC, TRIG, CHOLHDL, LDLDIRECT in the last 72 hours. Thyroid Function Tests: No results for input(s): TSH, T4TOTAL, FREET4, T3FREE, THYROIDAB in the last 72 hours. Anemia Panel: No results for input(s): VITAMINB12, FOLATE, FERRITIN, TIBC, IRON, RETICCTPCT in the last 72 hours. Sepsis Labs: Recent Labs  Lab 07/12/17 0454  LATICACIDVEN 1.75    No results found for  this or any previous visit (from the past 240 hour(s)).       Radiology Studies: Ct Abdomen Pelvis W Contrast  Result Date: 07/12/2017 CLINICAL DATA:  Abdominal pain with nausea and vomiting. Percutaneous peritoneal implant biopsy yesterday. EXAM: CT ABDOMEN AND PELVIS WITH CONTRAST TECHNIQUE: Multidetector CT imaging of the abdomen and pelvis was performed using the standard protocol following bolus administration of intravenous contrast. CONTRAST:  158mL ISOVUE-300 IOPAMIDOL (ISOVUE-300) INJECTION 61% COMPARISON:  CT abdomen pelvis dated June 27, 2017. FINDINGS: Lower chest: No acute abnormality. Hepatobiliary: No focal liver abnormality is seen. Status post cholecystectomy. No biliary dilatation. Pancreas: Unremarkable. No pancreatic ductal dilatation or surrounding inflammatory changes. Spleen: Normal in size without focal abnormality. Adrenals/Urinary Tract: The adrenal glands are unremarkable. Stable left renal cyst. No renal or ureteral calculi. The bladder is unremarkable. Stomach/Bowel: There are new multiple dilated loops of small bowel with a transition point seen in the right lower quadrant (series 3, image 52). The stomach is unremarkable. The colon is decompressed. Normal appendix. Vascular/Lymphatic: Aortic atherosclerosis. No enlarged abdominal or pelvic lymph nodes. Reproductive: Mixed cystic and solid mass in the left ovary  is unchanged. The uterus and right ovary are unremarkable. Other: Slight interval increase in intra-abdominal ascites. Omental nodularity in the left lower quadrant and right abdomen is largely unchanged. There are a few foci of air around the left lower quadrant peritoneal nodularity at the site of biopsy. Musculoskeletal: Postprocedure changes in the left lower anterior abdominal wall. No acute or suspicious osseous findings. IMPRESSION: 1. New small bowel obstruction with transition point seen in the right lower quadrant, possibly secondary to adhesions related to  peritoneal carcinomatosis. No pneumatosis. 2. Findings suggestive of metastatic ovarian cancer. Slightly worsened abdominal ascites may be related to metastatic disease or reactive to the small-bowel obstruction. 3. Expected post biopsy changes in the left lower quadrant adjacent to peritoneal nodularity. No intra-abdominal hemorrhage. Electronically Signed   By: Titus Dubin M.D.   On: 07/12/2017 07:34   Dg Abd Portable 1v-small Bowel Obstruction Protocol-initial, 8 Hr Delay  Result Date: 07/12/2017 CLINICAL DATA:  Small bowel obstruction. EXAM: PORTABLE ABDOMEN - 1 VIEW COMPARISON:  Earlier the same day. FINDINGS: NG tube tip is in the stomach. Gaseous distention of small bowel noted in the mid and left upper abdomen with small bowel loops measuring up to 3.7 cm diameter. There is probably some contrast in the right colon although this is not definite. Surgical clips in the right upper quadrant suggest prior cholecystectomy. IMPRESSION: Similar distention of small bowel in the left abdomen measuring up to 3.7 cm diameter. Probable contrast in the right colon although not definite. Electronically Signed   By: Misty Stanley M.D.   On: 07/12/2017 19:44   Dg Abd Portable 1v-small Bowel Protocol-position Verification  Result Date: 07/12/2017 CLINICAL DATA:  Nasogastric tube placement EXAM: PORTABLE ABDOMEN - 1 VIEW COMPARISON:  Portable exam 0846 hours compared to CT abdomen and pelvis 07/12/2017 FINDINGS: Tip of nasogastric tube projects over proximal to mid stomach. Dilated small bowel loops in mid abdomen. Excreted contrast material within nondilated renal collecting systems and a partially distended urinary bladder. Bones demineralized. IMPRESSION: Tip of nasogastric tube projects over proximal to mid stomach. Electronically Signed   By: Lavonia Dana M.D.   On: 07/12/2017 09:23        Scheduled Meds: . aspirin  300 mg Rectal Daily  . bisacodyl  10 mg Rectal Daily  . FLUoxetine  10 mg Oral Daily    . lip balm  1 application Topical BID  . pantoprazole (PROTONIX) IV  40 mg Intravenous QHS   Continuous Infusions: . dextrose 5 % and 0.45 % NaCl with KCl 20 mEq/L 75 mL/hr at 07/13/17 1009  . lactated ringers    . methocarbamol (ROBAXIN)  IV       LOS: 1 day    Time spent: 25 mins.More than 50% of that time was spent in counseling and/or coordination of care.      Shelly Coss, MD [facility] Pager [phone]  If 7PM-7AM, please contact night-coverage [email or web address] Password [facility] 07/13/2017, 2:44 PM

## 2017-07-14 ENCOUNTER — Inpatient Hospital Stay (HOSPITAL_COMMUNITY): Payer: Medicare Other

## 2017-07-14 ENCOUNTER — Telehealth: Payer: Self-pay | Admitting: Gynecologic Oncology

## 2017-07-14 MED ORDER — ASPIRIN 81 MG PO CHEW: 81.0000 mg | CHEWABLE_TABLET | Freq: Every day | ORAL | Status: DC

## 2017-07-14 MED ORDER — TICAGRELOR 90 MG PO TABS
90.0000 mg | ORAL_TABLET | Freq: Two times a day (BID) | ORAL | Status: DC
  Administered 2017-07-14: 90 mg via ORAL
  Filled 2017-07-14: qty 1

## 2017-07-14 MED ORDER — PANTOPRAZOLE SODIUM 40 MG PO TBEC: 40.0000 mg | DELAYED_RELEASE_TABLET | Freq: Every day | ORAL | Status: DC

## 2017-07-14 NOTE — Progress Notes (Signed)
Patient back on unit 6N from Radiology.

## 2017-07-14 NOTE — Progress Notes (Signed)
Patient ID: Carol Ferguson, female   DOB: 1950-09-14, 67 y.o.   MRN: [id number]  Known to IR Omental bx 3/22  Results pending  Came to ED that pm with N/V and abd pain Work up indicates pain and symptoms no probable relation to biopsy  Pt feeling better today NG has been removed  Hope to be dc'd today  To see Dr Alvy Bimler tomorrow for plan

## 2017-07-14 NOTE — Progress Notes (Signed)
CC: Abdominal pain  Subjective: She is very anxious but her x-ray looks good.  Her NG has been clamped for 24 hours without nausea or vomiting.  She is tolerating clears.  She would like to go home this evening to follow-up with her oncologist tomorrow.  Objective: Vital signs in last 24 hours: Temp:  [98.1 F (36.7 C)-99.1 F (37.3 C)] 99.1 F (37.3 C) (03/25 0453) Pulse Rate:  [63-72] 65 (03/25 0453) Resp:  [18] 18 (03/25 0453) BP: (112-115)/(47-71) 112/51 (03/25 0453) SpO2:  [96 %-100 %] 96 % (03/25 0453) Last BM Date: 07/14/17 Nothing p.o. Recorded. Voided x3 BM x2 recorded yesterday Afebrile vital signs are stable Labs yesterday showed a normal CBC and BMP. Three-way film today shows the lungs are clear, some hyperinflation secondary to COPD..  NG tube lies above the GE junction with the tip in the region of the gastric cardia.  There is contrast within the colon and rectum.  No evidence of bowel obstruction or ileus or perforation  Intake/Output from previous day: No intake/output data recorded. Intake/Output this shift: No intake/output data recorded.  General appearance: alert, cooperative and no distress Resp: clear to auscultation bilaterally GI: Soft, minimally tender, no distention positive bowel sounds, positive BM.  Lab Results:  Recent Labs    07/12/17 0436 07/13/17 0524  WBC 8.1 7.5  HGB 14.2 13.1  HCT 41.8 39.3  PLT 215 220    BMET Recent Labs    07/12/17 0436 07/13/17 0524  NA 135 140  K 3.8 3.7  CL 102 105  CO2 22 27  GLUCOSE 154* 128*  BUN 19 7  CREATININE 0.85 0.83  CALCIUM 8.6* 8.3*   PT/INR Recent Labs    07/11/17 1115 07/12/17 0452  LABPROT 14.3 14.2  INR 1.12 1.11    Recent Labs  Lab 07/12/17 0436  AST 19  ALT 13*  ALKPHOS 85  BILITOT 0.6  PROT 6.1*  ALBUMIN 3.2*     Lipase     Component Value Date/Time   LIPASE 25 07/12/2017 0436     Prior to Admission medications   Medication Sig Start Date End Date  Taking? Authorizing Provider  aspirin 81 MG chewable tablet Chew 1 tablet (81 mg total) by mouth daily. 09/28/16  Yes Dunn, Areta Haber, PA-C  atorvastatin (LIPITOR) 10 MG tablet Take 1 tablet (10 mg total) by mouth daily. 12/02/16 07/12/24 Yes Belva Crome, MD  BRILINTA 90 MG TABS tablet TAKE 1 TABLET BY MOUTH TWICE A DAY 10/25/16  Yes Dunn, Dayna N, PA-C  dicyclomine (BENTYL) 10 MG capsule TAKE 1 CAPSULE BY MOUTH EVERY 8 HOURS AS NEEDED FOR SPASMS. FOR ABDOMINAL DISCOMFORT 06/20/17  Yes Mauri Pole, MD  FLUoxetine (PROZAC) 10 MG capsule TAKE 1 CAPSULE BY MOUTH EVERY DAY 04/28/17  Yes Timmothy Euler, [place] D, PA-C  nitroGLYCERIN (NITROSTAT) 0.4 MG SL tablet Place 1 tablet (0.4 mg total) under the tongue every 5 (five) minutes x 3 doses as needed for chest pain. 09/28/16  Yes Dunn, Areta Haber, PA-C  omeprazole (PRILOSEC) 20 MG capsule Take 20 mg by mouth daily as needed.    Yes [provider]  ondansetron (ZOFRAN) 4 MG tablet Take 1 tablet (4 mg total) by mouth every 8 (eight) hours as needed for nausea or vomiting. 04/18/17  Yes Nandigam, Venia Minks, MD  traZODone (DESYREL) 50 MG tablet Take 0.5-1 tablets (25-50 mg total) at bedtime as needed by mouth for sleep. 03/06/17  Yes [name],  PA-C    Medications: . aspirin  300 mg Rectal Daily  . bisacodyl  10 mg Rectal Daily  . FLUoxetine  10 mg Oral Daily  . lip balm  1 application Topical BID  . pantoprazole (PROTONIX) IV  40 mg Intravenous QHS   . dextrose 5 % and 0.45 % NaCl with KCl 20 mEq/L 75 mL/hr at 07/14/17 0513  . lactated ringers    . methocarbamol (ROBAXIN)  IV       Assessment/Plan  Abdominal pain with possible ovarian cancer CT-guided peritoneal biopsy 07/11/17, IR Dr. Anselm Pancoast History of MI with stents -on Brilinta History of diastolic heart failure Tobacco abuse Hyperlipidemia  SBO  - Small bowel protocol -3/23 ongoing small bowel distention 3.7 cm, probable contrast right colon  - Plain film today -contrast in the  colon and rectum  - NG removed =>>full liquids FEN: IV fluids/clears ID: None DVT: SCDs -she can have anticoagulants for DVT prophylaxis from our standpoint Follow-up: To be determined   Plan: I have removed the NG, I will put her on full liquids.  Will discuss discharge later this evening if she does well with the NG out and full liquids.     LOS: 2 days    Jabin Tapp 07/14/2017 [phone]

## 2017-07-14 NOTE — Discharge Summary (Signed)
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 CT scan on 06/27/17 showed  new 7.5 cm left adnexal mass with worsening abdominal ascites and omental nodularity suggesting metastatic ovarian cancer.  OB/GYN was following her.  Patient underwent omental biopsy at the request of OB/GYN on 07/11/17.  Plan was to follow-up the biopsy report by OB/GYNarrange appointment with [name] as an outpatient as soon as the pathology is available. Patient presents to the emergency department with vomiting, abdominal pain.  There was concern of microperforation from CT-guided biopsy and CT imaging  showed new bowel obstruction.  General surgery consulted. Patient was managed with conservative therapy with IV fluids, n.p.o. status, antiemetics and pain medications.  This morning the NG tube has been removed.  She is tolerating full liquid diet. Patient is stable for discharge today to home.  She will follow-up with oncology,[name] at her office tomorrow.  Following problems were addressed during her hospitalization:  SBO: General surgery was following.  Was started on conservative management with NG tube which has been removed.  Patient has been tolerating full liquid diet.   There is no report of abdominal pain.  Patient had bowel movements yesterday.There was also concern for bowel perforation but it  has been ruled out by surgery. She is  Ambulating. Will recommend to take soft diet for the next 2-3 days.  Possible ovarian cancer: Biopsy report pending. Oncology, OB/GYN also aware for the biopsy follow up.CA 125 was drawn on 06/27/17 and was elevated at 90. CEA was 4.5. 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 She has appointment with oncology tomorrow.She should also follow-up with her OB/GYN.  Coronary artery disease:   Patient was on Brilinta at home which was held for last few days most likely for biopsy. We will resume on DC. Patient denies any chest pain.Follow up with cardiology as an outpatient.  Hyperlipidemia: Continue statin   GERD: Continue PPI  History of depression: On Prozac   Discharge Diagnoses:  Principal Problem:   SBO (small bowel obstruction) (HCC) Active Problems:   Chronic diastolic heart failure (HCC)   Tobacco abuse   Hyperlipidemia with target LDL less than 70   CAD (coronary artery disease), native coronary artery   Peritoneal carcinomatosis [facility])    Discharge Instructions  Discharge Instructions    Diet - low sodium heart healthy   Complete by:  As directed    Discharge instructions   Complete by:  As directed    1) Follow up with Oncology as an outpatient. 2)Continue soft diet for coming 2-3 days. 3) Follow up with your PCP in a week.   Increase activity slowly   Complete by:  As directed      Allergies as of 07/14/2017      Reactions   Flagyl [metronidazole] Nausea Only   DIARRHEA      Medication List    TAKE these medications   aspirin 81 MG chewable tablet Chew  1 tablet (81 mg total) by mouth daily.   atorvastatin 10 MG tablet Commonly known as:  LIPITOR Take 1 tablet (10 mg total) by mouth daily.   BRILINTA 90 MG Tabs tablet Generic drug:  ticagrelor TAKE 1 TABLET BY MOUTH TWICE A DAY   dicyclomine 10 MG capsule Commonly known as:  BENTYL TAKE 1 CAPSULE  BY MOUTH EVERY 8 HOURS AS NEEDED FOR SPASMS. FOR ABDOMINAL DISCOMFORT   FLUoxetine 10 MG capsule Commonly known as:  PROZAC TAKE 1 CAPSULE BY MOUTH EVERY DAY   nitroGLYCERIN 0.4 MG SL tablet Commonly known as:  NITROSTAT Place 1 tablet (0.4 mg total) under the tongue every 5 (five) minutes x 3 doses as needed for chest pain.   omeprazole 20 MG capsule Commonly known as:  PRILOSEC Take 20 mg by mouth daily as needed.   ondansetron 4 MG tablet Commonly known as:  ZOFRAN Take 1 tablet (4 mg total) by mouth every 8 (eight) hours as needed for nausea or vomiting.   traZODone 50 MG tablet Commonly known as:  DESYREL Take 0.5-1 tablets (25-50 mg total) at bedtime as needed by mouth for sleep.      Follow-up Information    [name], PA-C. Schedule an appointment as soon as possible for a visit in 1 week(s).   Specialties:  Librarian, academic, Family Medicine Contact information: [place] [address] [phone]          Allergies  Allergen Reactions  . Flagyl [Metronidazole] Nausea Only    DIARRHEA    Consultations: General surgery  Procedures/Studies: US Pelvis Transvanginal Non-ob (tv Only)  Result Date: 07/01/2017 T/V images.  Anteverted and heterogenous uterus measuring 5.85 x 3.87 x 3.36 cm.  Right subserous fibroid with calcifications measuring 2.7 x 2.6 cm.  Endometrial line appears thickened at 8.1 mm.  Cervix appears normal, no mass seen.  Right ovary not seen.  Left adnexal mass cystic/solid measuring 7.6 x 6.0 x 7.1 cm.  Positive color flow Doppler.  No free fluid in the posterior cul-de-sac.  Ct Abdomen Pelvis W Contrast  Result Date: 07/12/2017 CLINICAL DATA:  Abdominal pain with nausea and vomiting. Percutaneous peritoneal implant biopsy yesterday. EXAM: CT ABDOMEN AND PELVIS WITH CONTRAST TECHNIQUE: Multidetector CT imaging of the abdomen and pelvis was performed using the standard protocol following bolus administration of  intravenous contrast. CONTRAST:  158mL ISOVUE-300 IOPAMIDOL (ISOVUE-300) INJECTION 61% COMPARISON:  CT abdomen pelvis dated June 27, 2017. FINDINGS: Lower chest: No acute abnormality. Hepatobiliary: No focal liver abnormality is seen. Status post cholecystectomy. No biliary dilatation. Pancreas: Unremarkable. No pancreatic ductal dilatation or surrounding inflammatory changes. Spleen: Normal in size without focal abnormality. Adrenals/Urinary Tract: The adrenal glands are unremarkable. Stable left renal cyst. No renal or ureteral calculi. The bladder is unremarkable. Stomach/Bowel: There are new multiple dilated loops of small bowel with a transition point seen in the right lower quadrant (series 3, image 52). The stomach is unremarkable. The colon is decompressed. Normal appendix. Vascular/Lymphatic: Aortic atherosclerosis. No enlarged abdominal or pelvic lymph nodes. Reproductive: Mixed cystic and solid mass in the left ovary is unchanged. The uterus and right ovary are unremarkable. Other: Slight interval increase in intra-abdominal ascites. Omental nodularity in the left lower quadrant and right abdomen is largely unchanged. There are a few foci of air around the left lower quadrant peritoneal nodularity at the site of biopsy. Musculoskeletal: Postprocedure changes in the left lower anterior abdominal wall. No acute or suspicious osseous findings. IMPRESSION: 1. New  small bowel obstruction with transition point seen in the right lower quadrant, possibly secondary to adhesions related to peritoneal carcinomatosis. No pneumatosis. 2. Findings suggestive of metastatic ovarian cancer. Slightly worsened abdominal ascites may be related to metastatic disease or reactive to the small-bowel obstruction. 3. Expected post biopsy changes in the left lower quadrant adjacent to peritoneal nodularity. No intra-abdominal hemorrhage. Electronically Signed   By: Titus Dubin M.D.   On: 07/12/2017 07:34   Ct Abdomen Pelvis W  Contrast  Result Date: 06/27/2017 CLINICAL DATA:  Generalized abd pain, mild to severe at times since heart attack in June 2018. Recent ovarian cyst seen on [place]. No other symptoms. EXAM: CT ABDOMEN AND PELVIS WITH CONTRAST TECHNIQUE: Multidetector CT imaging of the abdomen and pelvis was performed using the standard protocol following bolus administration of intravenous contrast. CONTRAST:  144mL ISOVUE-300 IOPAMIDOL (ISOVUE-300) INJECTION 61% COMPARISON:  03/22/2017 FINDINGS: Lower chest: No acute abnormality. Hepatobiliary: No focal liver abnormality is seen. Status post cholecystectomy. No biliary dilatation. Pancreas: Unremarkable. No pancreatic ductal dilatation or surrounding inflammatory changes. Spleen: Normal in size without focal abnormality. Adrenals/Urinary Tract: Normal adrenals. Stable left upper pole renal cyst. No solid renal mass. No hydronephrosis. Urinary bladder incompletely distended. Stomach/Bowel: Stomach is incompletely distended. Small bowel is nondilated. Colon and rectum physiologically distended. Vascular/Lymphatic: Scattered aortoiliac atheromatous calcifications without aneurysm. Portal vein patent. No abdominal or pelvic adenopathy. Reproductive: Uterus and right adnexal region unremarkable. 7.4 cm mixed solid/cystic left adnexal mass, new since prior exam. Other: There is a small amount of perihepatic and lower peritoneal ascites, slightly increased since prior. Progressive omental nodularity in the left lower quadrant and anterior right mid abdomen. No free air. 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 No fracture or worrisome bone lesion. IMPRESSION: 1. New 7.4 cm left adnexal mass with worsening abdominal ascites and omental nodularity, suggesting metastatic ovarian carcinoma. Consider Gyn-Onc consultation. Electronically Signed   By: Lucrezia Europe M.D.   On: 06/27/2017 11:33   Ct Biopsy  Result Date: 07/11/2017 INDICATION: 67 year old with spindle  cell neoplasm of the cervix and evidence for peritoneal carcinomatosis. There is also concern for ovarian lesions. EXAM: CT-GUIDED PERITONEAL BIOPSY MEDICATIONS: None. ANESTHESIA/SEDATION: Moderate (conscious) sedation was employed during this procedure. A total of Versed 4.0 mg and Fentanyl 200 mcg was administered intravenously. Moderate Sedation Time: 44 minutes. The patient's level of consciousness and vital signs were monitored continuously by radiology nursing throughout the procedure under my direct supervision. FLUOROSCOPY TIME:  None COMPLICATIONS: None immediate. PROCEDURE: Informed written consent was obtained from the patient after a thorough discussion of the procedural risks, benefits and alternatives. All questions were addressed. A timeout was performed prior to the initiation of the procedure. Patient was placed supine on CT scanner. Images through the abdomen were obtained. The peritoneal thickening in the left lower abdomen was targeted. The anterior and left side of the abdomen was prepped with chlorhexidine and a sterile field was created. Skin was anesthetized with 1% lidocaine. 17 gauge coaxial needle was directed towards this peritoneal thickening from medial to lateral approach with CT guidance. Needle was repositioned multiple times. It was very difficult to position the coaxial needle within the peritoneal thickening without risking bowel injury. Eventually, the needle was felt to be safely along the periphery of the peritoneal cavity. A total of 3 core biopsies were obtained with an 18 core device. Tiny pieces of tissue were collected and placed in formalin. Needle was removed without complication. FINDINGS: There is peritoneal thickening  in the lateral left lower abdomen. There is also mild peritoneal thickening along the anterior abdomen and a small amount of ascites. Needle was positioned along the superficial aspect of the left lateral peritoneal cavity. Small amount of  intraperitoneal gas at the biopsy site following the procedure. No significant bleeding. No evidence for bowel injury. IMPRESSION: CT-guided biopsy of the peritoneal thickening in the left lateral abdomen. Procedure was technically challenging due to the small amount of peritoneal thickening and close proximity of multiple bowel loops. False negative biopsy is possible. Electronically Signed   By: Markus Daft M.D.   On: 07/11/2017 18:16   Dg Abd Acute W/chest  Result Date: 07/14/2017 CLINICAL DATA:  History of small bowel obstruction with NG tube treatment. Patient has history of pneumonia and diverticulitis. Previous cholecystectomy. EXAM: DG ABDOMEN ACUTE W/ 1V CHEST COMPARISON:  KUB of July 12, 2017 FINDINGS: The lungs are well-expanded and clear. The heart and pulmonary vascularity are normal. The mediastinum is normal in width. There is no significant pleural effusion. The esophagogastric tube proximal port lies above the GE junction with the tip in the region of the gastric cardia. Within the abdomen there is contrast within normal caliber colon and rectum. No small bowel contrast or air is observed. There is some gas within the stomach. There surgical clips in the gallbladder fossa. There is barium within sigmoid diverticula. The bony structures exhibit no acute abnormalities. IMPRESSION: Mild hyperinflation consistent with COPD. Advancement of the nasogastric tube by 5-10 cm is recommended. Within the abdomen there is no evidence of bowel obstruction, ileus, or perforation. There is barium within diverticula in the sigmoid colon. Electronically Signed   By: David  Martinique M.D.   On: 07/14/2017 08:18   Dg Abd Portable 1v-small Bowel Obstruction Protocol-initial, 8 Hr Delay  Result Date: 07/12/2017 CLINICAL DATA:  Small bowel obstruction. EXAM: PORTABLE ABDOMEN - 1 VIEW COMPARISON:  Earlier the same day. FINDINGS: NG tube tip is in the stomach. Gaseous distention of small bowel noted in the mid and left  upper abdomen with small bowel loops measuring up to 3.7 cm diameter. There is probably some contrast in the right colon although this is not definite. Surgical clips in the right upper quadrant suggest prior cholecystectomy. IMPRESSION: Similar distention of small bowel in the left abdomen measuring up to 3.7 cm diameter. Probable contrast in the right colon although not definite. Electronically Signed   By: Misty Stanley M.D.   On: 07/12/2017 19:44   Dg Abd Portable 1v-small Bowel Protocol-position Verification  Result Date: 07/12/2017 CLINICAL DATA:  Nasogastric tube placement EXAM: PORTABLE ABDOMEN - 1 VIEW COMPARISON:  Portable exam 0846 hours compared to CT abdomen and pelvis 07/12/2017 FINDINGS: Tip of nasogastric tube projects over proximal to mid stomach. Dilated small bowel loops in mid abdomen. Excreted contrast material within nondilated renal collecting systems and a partially distended urinary bladder. Bones demineralized. IMPRESSION: Tip of nasogastric tube projects over proximal to mid stomach. Electronically Signed   By: Lavonia Dana M.D.   On: 07/12/2017 09:23   US Abdominal Pelvic Art/vent Flow Doppler Limited  Result Date: 07/01/2017 T/V images.  Anteverted and heterogenous uterus measuring 5.85 x 3.87 x 3.36 cm.  Right subserous fibroid with calcifications measuring 2.7 x 2.6 cm.  Endometrial line appears thickened at 8.1 mm.  Cervix appears normal, no mass seen.  Right ovary not seen.  Left adnexal mass cystic/solid measuring 7.6 x 6.0 x 7.1 cm.  Positive color flow Doppler.  No free  fluid in the posterior cul-de-sac.  (Echo, Carotid, EGD, Colonoscopy, ERCP)    Subjective: Patient seen and examined the bedside this morning.  Remains comfortable.  Denies any abdominal pain.  Currently tolerating full liquid diet. Stable for discharge home today  Discharge Exam: Vitals:   07/14/17 0453 07/14/17 1314  BP: (!) 112/51 107/66  Pulse: 65 62  Resp: 18 20  Temp: 99.1 F (37.3 C)  (!) 97.5 F (36.4 C)  SpO2: 96% 99%   Vitals:   07/13/17 1418 07/13/17 2151 07/14/17 0453 07/14/17 1314  BP: (!) 113/47 115/71 (!) 112/51 107/66  Pulse: 63 72 65 62  Resp: 18 18 18 20   Temp: 98.1 F (36.7 C) 98.6 F (37 C) 99.1 F (37.3 C) (!) 97.5 F (36.4 C)  TempSrc: Oral Oral Oral Oral  SpO2: 100% 98% 96% 99%  Weight:      Height:        General: Pt is alert, awake, not in acute distress Cardiovascular: RRR, S1/S2 +, no rubs, no gallops Respiratory: CTA bilaterally, no wheezing, no rhonchi Abdominal: Soft, NT, ND, bowel sounds + Extremities: no edema, no cyanosis    The results of significant diagnostics from this hospitalization (including imaging, microbiology, ancillary and laboratory) are listed below for reference.     Microbiology: No results found for this or any previous visit (from the past 240 hour(s)).   Labs: BNP (last 3 results) No results for input(s): BNP in the last 8760 hours. Basic Metabolic Panel: Recent Labs  Lab 07/12/17 0436 07/13/17 0524  NA 135 140  K 3.8 3.7  CL 102 105  CO2 22 27  GLUCOSE 154* 128*  BUN 19 7  CREATININE 0.85 0.83  CALCIUM 8.6* 8.3*   Liver Function Tests: Recent Labs  Lab 07/12/17 0436  AST 19  ALT 13*  ALKPHOS 85  BILITOT 0.6  PROT 6.1*  ALBUMIN 3.2*   Recent Labs  Lab 07/12/17 0436  LIPASE 25   No results for input(s): AMMONIA in the last 168 hours. CBC: Recent Labs  Lab 07/11/17 1003 07/12/17 0436 07/13/17 0524  WBC 5.8 8.1 7.5  NEUTROABS 3.5  --   --   HGB 14.6 14.2 13.1  HCT 42.9 41.8 39.3  MCV 98.2 97.4 98.0  PLT 221 215 220   Cardiac Enzymes: No results for input(s): CKTOTAL, CKMB, CKMBINDEX, TROPONINI in the last 168 hours. BNP: Invalid input(s): POCBNP CBG: No results for input(s): GLUCAP in the last 168 hours. D-Dimer No results for input(s): DDIMER in the last 72 hours. Hgb A1c No results for input(s): HGBA1C in the last 72 hours. Lipid Profile No results for input(s):  CHOL, HDL, LDLCALC, TRIG, CHOLHDL, LDLDIRECT in the last 72 hours. Thyroid function studies No results for input(s): TSH, T4TOTAL, T3FREE, THYROIDAB in the last 72 hours.  Invalid input(s): FREET3 Anemia work up No results for input(s): VITAMINB12, FOLATE, FERRITIN, TIBC, IRON, RETICCTPCT in the last 72 hours. Urinalysis    Component Value Date/Time   COLORURINE YELLOW 07/12/2017 0540   APPEARANCEUR CLEAR 07/12/2017 0540   LABSPEC 1.009 07/12/2017 0540   PHURINE 7.0 07/12/2017 0540   GLUCOSEU NEGATIVE 07/12/2017 0540   HGBUR SMALL (A) 07/12/2017 0540   BILIRUBINUR NEGATIVE 07/12/2017 0540   BILIRUBINUR small (A) 12/03/2016 0917   KETONESUR NEGATIVE 07/12/2017 0540   PROTEINUR NEGATIVE 07/12/2017 0540   UROBILINOGEN 0.2 12/03/2016 0917   UROBILINOGEN 0.2 08/06/2010 0246   NITRITE NEGATIVE 07/12/2017 0540   LEUKOCYTESUR NEGATIVE 07/12/2017 0540  Sepsis Labs Invalid input(s): PROCALCITONIN,  WBC,  LACTICIDVEN Microbiology No results found for this or any previous visit (from the past 240 hour(s)).   Time coordinating discharge: Over 30 minutes  SIGNED:   Shelly Coss, MD  [facility] 07/14/2017, 3:46 PM Pager [phone]  If 7PM-7AM, please contact night-coverage [email or web address] Password [email or web address]

## 2017-07-14 NOTE — Telephone Encounter (Signed)
Returned call to patient.  All questions answered.  She is to continue with the plan to see [name] in the office tomorrow or if she is still inpt, [name] to see patient inpatient.  Advised to call for any needs.  Path from biopsy still pending.

## 2017-07-14 NOTE — Progress Notes (Signed)
Patient to Radiology

## 2017-07-14 NOTE — Progress Notes (Signed)
Discharge home. Home discharge instruction given, no question verbalized. 

## 2017-07-15 ENCOUNTER — Inpatient Hospital Stay (HOSPITAL_BASED_OUTPATIENT_CLINIC_OR_DEPARTMENT_OTHER): Payer: Medicare Other | Admitting: Hematology and Oncology

## 2017-07-15 ENCOUNTER — Telehealth: Payer: Self-pay | Admitting: Hematology and Oncology

## 2017-07-15 ENCOUNTER — Encounter: Payer: Self-pay | Admitting: Hematology and Oncology

## 2017-07-15 ENCOUNTER — Encounter: Payer: Self-pay | Admitting: Gynecologic Oncology

## 2017-07-15 ENCOUNTER — Other Ambulatory Visit: Payer: Self-pay | Admitting: Hematology and Oncology

## 2017-07-15 ENCOUNTER — Telehealth: Payer: Self-pay | Admitting: *Deleted

## 2017-07-15 DIAGNOSIS — C801 Malignant (primary) neoplasm, unspecified: Secondary | ICD-10-CM

## 2017-07-15 DIAGNOSIS — Z87891 Personal history of nicotine dependence: Secondary | ICD-10-CM

## 2017-07-15 DIAGNOSIS — C786 Secondary malignant neoplasm of retroperitoneum and peritoneum: Secondary | ICD-10-CM

## 2017-07-15 DIAGNOSIS — C562 Malignant neoplasm of left ovary: Secondary | ICD-10-CM | POA: Insufficient documentation

## 2017-07-15 DIAGNOSIS — R634 Abnormal weight loss: Secondary | ICD-10-CM

## 2017-07-15 DIAGNOSIS — I25118 Atherosclerotic heart disease of native coronary artery with other forms of angina pectoris: Secondary | ICD-10-CM

## 2017-07-15 DIAGNOSIS — Z79899 Other long term (current) drug therapy: Secondary | ICD-10-CM

## 2017-07-15 DIAGNOSIS — D390 Neoplasm of uncertain behavior of uterus: Secondary | ICD-10-CM | POA: Diagnosis not present

## 2017-07-15 DIAGNOSIS — Z7189 Other specified counseling: Secondary | ICD-10-CM

## 2017-07-15 DIAGNOSIS — C189 Malignant neoplasm of colon, unspecified: Secondary | ICD-10-CM

## 2017-07-15 DIAGNOSIS — I252 Old myocardial infarction: Secondary | ICD-10-CM | POA: Diagnosis not present

## 2017-07-15 DIAGNOSIS — R971 Elevated cancer antigen 125 [CA 125]: Secondary | ICD-10-CM

## 2017-07-15 NOTE — Telephone Encounter (Signed)
[facility] Pathology notified to add: KRAS, BRAF and NRAS

## 2017-07-15 NOTE — Assessment & Plan Note (Signed)
Due to her prior history of myocardial infarction, the patient is on antiplatelet agents She will continue medical management For that reason, I do not plan to order port placement to avoid interruption of anticoagulation therapy

## 2017-07-15 NOTE — Assessment & Plan Note (Addendum)
I have reviewed the CT imaging and pathology report with the patient and her niece We have reviewed her case at the GYN oncology tumor board yesterday. Surprisingly, pathology report revealed possible GI malignancy rather than GYN malignancy Unfortunately, the patient has significant disease burden and locally advanced disease I would request additional molecular testing on her tissue sample I have discussed this with general surgery and the patient is considered unresectable We discussed the risk and benefits of second opinion but the patient declined second opinion We discussed the general approach to cancer I recommend systemic chemotherapy to proceed as soon as possible I recommend PICC line placement, chemo education class and chemotherapy consent this week and proceed with treatment next week

## 2017-07-15 NOTE — Assessment & Plan Note (Signed)
The patient had recurrent subacute bowel obstruction due to peritoneal carcinomatosis She is not a candidate for surgical resection I recommend low-dose laxative as tolerated to avoid constipation and low residual diet

## 2017-07-15 NOTE — Progress Notes (Signed)
[facility] ordered on path [id number] with Suanne Marker from [place] per Dr. Calton Dach request.

## 2017-07-15 NOTE — Progress Notes (Signed)
START ON PATHWAY REGIMEN - Colorectal     A cycle is every 14 days:     Oxaliplatin      Leucovorin      5-Fluorouracil      5-Fluorouracil   **Always confirm dose/schedule in your pharmacy ordering system**    Patient Characteristics: Metastatic Colorectal, First Line, Nonsurgical Candidate, KRAS Mutation Positive/Unknown, BRAF Wild-Type/Unknown, PS = 0,1; Bevacizumab Ineligible Current evidence of distant metastases<= Yes AJCC T Category: T4 AJCC N Category: N0 AJCC M Category: M1 AJCC 8 Stage Grouping: Unknown BRAF Mutation Status: Awaiting Test Results KRAS/NRAS Mutation Status: Awaiting Test Results Line of therapy: First Line Would you be surprised if this patient died  in the next year<= I would be surprised if this patient died in the next year Performance Status: PS = 0, 1 Bevacizumab Eligibility: Ineligible Intent of Therapy: Non-Curative / Palliative Intent, Discussed with Patient

## 2017-07-15 NOTE — Telephone Encounter (Signed)
Gave avs and calendar however waiting to see regarding 4/2

## 2017-07-15 NOTE — Progress Notes (Signed)
[place] NOTE  Patient Care Team: Donzetta Kohut as PCP - General (Physician Assistant) [name], MD as Consulting Physician (Obstetrics and Gynecology) Mauri Pole, MD as Consulting Physician (Gastroenterology) Belva Crome, MD as Consulting Physician (Cardiology)  ASSESSMENT & PLAN:  Colon cancer [facility]) I have reviewed the CT imaging and pathology report with the patient and her niece We have reviewed her case at the GYN oncology tumor board yesterday. Surprisingly, pathology report revealed possible GI malignancy rather than GYN malignancy Unfortunately, the patient has significant disease burden and locally advanced disease I would request additional molecular testing on her tissue sample I have discussed this with general surgery and the patient is considered unresectable We discussed the risk and benefits of second opinion but the patient declined second opinion We discussed the general approach to cancer I recommend systemic chemotherapy to proceed as soon as possible I recommend PICC line placement, chemo education class and chemotherapy consent this week and proceed with treatment next week   Peritoneal carcinomatosis ([place]) The patient had 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 We discussed importance of Advanced Directives and Living will. Her niece, Rachel Moulds is  her MPOA I have obtain a copy of her advance directives and will get it scanned in the computer   Orders Placed This Encounter  Procedures  . CEA    Standing Status:   Standing    Number of Occurrences:   9    Standing Expiration Date:   07/16/2018     CHIEF COMPLAINTS/PURPOSE OF CONSULTATION:  GI malignancy with peritoneal carcinomatosis, suspect metastatic colon cancer  HISTORY OF PRESENTING ILLNESS:  Carol Ferguson 67 y.o. female is here because of suspect metastatic colon cancer. She is here accompanied by her niece The patient had gradual, progressive symptoms of early satiety, abdominal bloating, nausea, changes in bowel habits, and 40 pound weight loss. She has never have screening colonoscopy in the past. Last year, she developed myocardial infarction requiring stent placement. Due to her anticoagulation therapy, she is not able to get evaluation with GI workup. She had numerous imaging studies and evaluation. Her case was initially centered around abnormal cervix leading to multiple imaging studies and Pap smear and cervical biopsy. None of those results yield malignancy. Her symptoms has gotten progressively worse and eventually requiring peritoneal biopsy. She was referred here initially on the presumed diagnosis of ovarian cancer. Over the weekend, the patient was admitted to the hospital with signs and symptoms of subacute bowel obstruction, managed conservatively. Her case was presented at the tumor board yesterday and I had numerous discussion with multiple team members regarding her case today.  I have reviewed her chart and materials related to her cancer extensively and collaborated history with the patient. Summary of oncologic history is as follows:   Colon cancer ([place])   12/17/2016 Imaging    US pelvis 1. There appears to be a 2.2 x 1.9 x 2.0 cm mass centered in the cervix, likely extending  into the lower uterus. Recommend correlation with physical exam. An MRI  could better evaluate. 2. Thickened endometrium measuring 12 mm. Recommend gynecologic consultation and endometrial sampling given patient age. 3. Probable fibroid in the uterus.      12/27/2016 Pathology Results    Endocervix, curettage - BENIGN ENDOCERVICAL MUCOSA AND BENIGN SQUAMOUS MUCOSA      01/01/2017 Imaging    MRI pelvis 1. No classic intermediate to high signal intensity tissue disrupting the hypointense fibrous cervical stroma as typically seen in the setting of cervical carcinoma. However, there is asymmetric thickening of the posterior cervix with anterior displacement of the cervical canal and associated widening and heterogeneity of the endometrial cavity. As such, imaging features are indeterminate by MRI and close follow-up or tissue sampling is recommended. 2. Apparent areas of tethering/scarring between the sigmoid colon posterior uterus/cervix. Diverticuli are noted in the sigmoid colon and this may be related to scarring from prior bouts is diverticulitis. 3. Small volume intraperitoneal free fluid.      01/28/2017 Pathology Results    1. Cervix, biopsy, posterior - SPINDLE CELL LESION CONSISTENT WITH SMOOTH MUSCLE NEOPLASM, SEE COMMENT. 2. Endometrium, curettage - BENIGN SQUAMOUS MUCOSA. - BENIGN SMOOTH MUSCLE. - NO ENDOMETRIUM PRESENT. Microscopic Comment 1. There is a spindle cell lesion with focal atypia. There is no necrosis or increase in mitotic figures. The sample is somewhat limited, but the differential includes a cellular leiomyoma.      01/28/2017 Surgery    Surgery: ultrasound guided dilation of cervix with D&C and cervical biopsy  Surgeons:  Donaciano Eva, MD  Operative findings: 6cm uterus, dilated endometrial cavity, very stenotic cervical os. Thickened, fibrotic cervix without discrete mass, grossly normal cervical mucosa.         03/22/2017 Imaging    CT abdomen and pelvis 1. Diffuse prominent fluid-filled small bowel with mesenteric  edema, enteritis pattern. This may be infectious or inflammatory. There is no evidence of obstruction. 2. Mild omental stranding in the left abdomen likely reactive secondary to primary bowel process, omental caking is not entirely excluded. 3. Sigmoid colonic diverticulosis without diverticulitis. 4. Prominent heterogeneous endometrium, recommend correlation with recent D and C. 5.  Aortic Atherosclerosis (ICD10-I70.0).      06/26/2017 Imaging    US pelvis T/V images. Anteverted and heterogenous uterus measuring 5.85 x 3.87 x 3.36 cm. Right subserous fibroid with calcifications measuring 2.7 x 2.6 cm. Endometrial line appears thickened at 8.1 mm. Cervix appears normal, no mass seen. Right ovary not seen. Left adnexal mass cystic/solid measuring 7.6 x 6.0 x 7.1 cm. Positive color flow Doppler.No free fluid in the posterior cul-de-sac.      06/26/2017 Tumor Marker    Patient's tumor was tested for the following markers: CA-125 Results of the tumor marker test revealed 90      06/26/2017 Tumor Marker    Patient's tumor was tested for the following markers: CEA Results of the tumor marker test revealed 4.5      06/27/2017 Imaging    Ct abdomen and pelvis New 7.4 cm left adnexal mass with worsening abdominal ascites and omental nodularity, suggesting metastatic ovarian carcinoma.      07/02/2017 Pathology Results    Cervix, biopsy, ectocervix - SQUAMOUS MUCOSA WITH ATYPICAL INFILTRATE WITHIN FIBROMUSCULAR STROMA. - SEE MICROSCOPIC DESCRIPTION. Microscopic Comment There is a small fragment of squamous mucosa with a small portion of benign squamous epithelium. Within the fibromuscular stroma there are strands and nests of epithelioid cells, which have an infiltrating pattern  and there are a few with cytoplasmic vacuoles. The findings are worrisome for an infiltrating neoplastic process and additional tissue may be indicated. There is insufficient tissue remaining in the current biopsy for  additional studies.      07/11/2017 Procedure    CT-guided biopsy of the peritoneal thickening in the left lateral abdomen. Procedure was technically challenging due to the small amount of peritoneal thickening and close proximity of multiple bowel loops. False negative biopsy is possible.      07/11/2017 Pathology Results    Soft Tissue Needle Core Biopsy, omentum - ADENOCARCINOMA. - SEE COMMENT. Microscopic Comment The malignant cells are positive for CDX-2, cytokeratin 20, and p53. They are negative for cytokeratin 7, estrogen receptor, and PAX-8. This immunohistochemical profile argues against a gynecologic primary, and the differential diagnosis includes gastrointestinal primary.      07/12/2017 Imaging    Ct abdomen and pelvis 1. New small bowel obstruction with transition point seen in the right lower quadrant, possibly secondary to adhesions related to peritoneal carcinomatosis. No pneumatosis. 2. Findings suggestive of metastatic ovarian cancer. Slightly worsened abdominal ascites may be related to metastatic disease or reactive to the small-bowel obstruction. 3. Expected post biopsy changes in the left lower quadrant adjacent to peritoneal nodularity. No intra-abdominal hemorrhage.      07/12/2017 - 07/14/2017 Hospital Admission    She was admitted for management of subacute bowel obstruction      07/15/2017 Cancer Staging    Staging form: Colon and Rectum, AJCC 8th Edition - Clinical: Stage Unknown (cTX, cNX, pM1) - Signed by Heath Lark, MD on 07/15/2017      At present time, she denies significant abdominal pain.  She continues to have mild nausea and abdominal bloating.  She had bowel movement prior to discharge from the hospital.  MEDICAL HISTORY:  Past Medical History:  Diagnosis Date  . Anxiety   . 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 CAD (coronary artery disease)    a. Inf STEMI/LHC 09/2016 which showed occluded mid LCx treated with PCI, DES; remaining cath  details included a proximal to mid LAD lesion of 20% stenosis, and proximal to mid RCA 25% stenosis. EF 55-60%.  . Depression   . Diverticulosis   . Gallstones   . Ischemic cardiomyopathy    a. 2D Echo 09/28/16 showed EF 55-60%, probable severe hypokinesis of the entire inferiormyocardium, normal diastolic parameters.  . Myocardial infarction ([place]) 09/26/2016  . Pneumonia   . Presence of tooth-root and mandibular implants   . Tobacco abuse   . Wears glasses     SURGICAL HISTORY: Past Surgical History:  Procedure Laterality Date  . CERVICAL CONIZATION W/BX N/A 01/28/2017   Procedure: CONIZATION CERVIX WITH BIOPSY;  Surgeon: [name], MD;  Location: [facility];  Service: Gynecology;  Laterality: N/A;  . CHOLECYSTECTOMY    . CORONARY STENT INTERVENTION N/A 09/26/2016   Procedure: Coronary Stent Intervention;  Surgeon: Belva Crome, MD;  Location: [place] [facility];  Service: Cardiovascular;  Laterality: N/A;  . DILATION AND CURETTAGE OF UTERUS N/A 01/28/2017   Procedure: DILATATION AND CURETTAGE WITH ULTRASOUND GUIDENCE;  Surgeon: [name], MD;  Location: [place];  Service: Gynecology;  Laterality: N/A;  . LEFT HEART CATH AND CORONARY ANGIOGRAPHY N/A 09/26/2016   Procedure: Left Heart Cath and Coronary Angiography;  Surgeon: Belva Crome, MD;  Location: [place] [facility];  Service: Cardiovascular;  Laterality: N/A;    SOCIAL  HISTORY: Social History   Socioeconomic History  . Marital status: Divorced    Spouse name: Not on file  . Number of children: 0  . Years of education: Not on file  . Highest education level: Not on file  Occupational History  . Occupation: retired  Scientific laboratory technician  . Financial resource strain: Not on file  . Food insecurity:    Worry: Not on file    Inability: Not on file  . Transportation needs:    Medical: Not on file    Non-medical: Not on file  Tobacco Use  . Smoking status: Former Smoker    Packs/day: 1.00     Years: 35.00    Pack years: 35.00    Types: Cigarettes    Last attempt to quit: 09/26/2016    Years since quitting: 0.8  . Smokeless tobacco: Never Used  Substance and Sexual Activity  . Alcohol use: Yes    Comment: occasional  . Drug use: No  . Sexual activity: Never  Lifestyle  . Physical activity:    Days per week: Not on file    Minutes per session: Not on file  . Stress: Not on file  Relationships  . Social connections:    Talks on phone: Not on file    Gets together: Not on file    Attends religious service: Not on file    Active member of club or organization: Not on file    Attends meetings of clubs or organizations: Not on file    Relationship status: Not on file  . Intimate partner violence:    Fear of current or ex partner: Not on file    Emotionally abused: Not on file    Physically abused: Not on file    Forced sexual activity: Not on file  Other Topics Concern  . Not on file  Social History Narrative  . Not on file    FAMILY HISTORY: Family History  Problem Relation Age of Onset  . Cancer Brother        MELANOMA  . Heart disease Father   . Stroke Father   . Heart disease Brother   . Diabetes Brother   . Heart attack Brother   . Heart failure Brother     ALLERGIES:  is allergic to flagyl [metronidazole].  MEDICATIONS:  Current Outpatient Medications  Medication Sig Dispense Refill  . aspirin 81 MG chewable tablet Chew 1 tablet (81 mg total) by mouth daily. 30 tablet 11  . atorvastatin (LIPITOR) 10 MG tablet Take 1 tablet (10 mg total) by mouth daily. 90 tablet 3  . BRILINTA 90 MG TABS tablet TAKE 1 TABLET BY MOUTH TWICE A DAY 60 tablet 10  . dicyclomine (BENTYL) 10 MG capsule TAKE 1 CAPSULE BY MOUTH EVERY 8 HOURS AS NEEDED FOR SPASMS. FOR ABDOMINAL DISCOMFORT 90 capsule 5  . FLUoxetine (PROZAC) 10 MG capsule TAKE 1 CAPSULE BY MOUTH EVERY DAY 90 capsule 0  . nitroGLYCERIN (NITROSTAT) 0.4 MG SL tablet Place 1 tablet (0.4 mg total) under the tongue  every 5 (five) minutes x 3 doses as needed for chest pain. 25 tablet 11  . omeprazole (PRILOSEC) 20 MG capsule Take 20 mg by mouth daily as needed.     . ondansetron (ZOFRAN) 4 MG tablet Take 1 tablet (4 mg total) by mouth every 8 (eight) hours as needed for nausea or vomiting. 30 tablet 1  . traZODone (DESYREL) 50 MG tablet Take 0.5-1 tablets (25-50 mg total) at bedtime as  needed by mouth for sleep. 30 tablet 2   No current facility-administered medications for this visit.     REVIEW OF SYSTEMS:   Constitutional: Denies fevers, chills or abnormal night sweats Eyes: Denies blurriness of vision, double vision or watery eyes Ears, nose, mouth, throat, and face: Denies mucositis or sore throat Respiratory: Denies cough, dyspnea or wheezes Cardiovascular: Denies palpitation, chest discomfort or lower extremity swelling Skin: Denies abnormal skin rashes Lymphatics: Denies new lymphadenopathy or easy bruising Neurological:Denies numbness, tingling or new weaknesses Behavioral/Psych: Mood is stable, no new changes  All other systems were reviewed with the patient and are negative.  PHYSICAL EXAMINATION: ECOG PERFORMANCE STATUS: 1 - Symptomatic but completely ambulatory  Vitals:   07/15/17 1130  BP: 126/63  Pulse: 67  Resp: 18  Temp: 98.4 F (36.9 C)  SpO2: 100%   Filed Weights   07/15/17 1130  Weight: (!) 1291 lb (585.6 kg)    GENERAL:alert, no distress and comfortable SKIN: skin color, texture, turgor are normal, no rashes or significant lesions EYES: normal, conjunctiva are pink and non-injected, sclera clear OROPHARYNX:no exudate, no erythema and lips, buccal mucosa, and tongue normal  NECK: supple, thyroid normal size, non-tender, without nodularity LYMPH:  no palpable lymphadenopathy in the cervical, axillary or inguinal LUNGS: clear to auscultation and percussion with normal breathing effort HEART: regular rate & rhythm and no murmurs and no lower extremity  edema ABDOMEN:abdomen soft, appears distended, hyperactive bowel sounds on the right side Musculoskeletal:no cyanosis of digits and no clubbing  PSYCH: alert & oriented x 3 with fluent speech NEURO: no focal motor/sensory deficits  LABORATORY DATA:  I have reviewed the data as listed Lab Results  Component Value Date   WBC 7.5 07/13/2017   HGB 13.1 07/13/2017   HCT 39.3 07/13/2017   MCV 98.0 07/13/2017   PLT 220 07/13/2017   Recent Labs    10/08/16 0936 11/28/16 0849  02/01/17 1141 03/21/17 2334 06/18/17 1612 07/12/17 0436 07/13/17 0524  NA  --   --    < > 138 141 140 135 140  K  --   --    < > 3.7 3.2* 3.8 3.8 3.7  CL  --   --   --  104 106 104 102 105  CO2  --   --   --  22 25 31 22 27   GLUCOSE  --   --    < > 100* 129* 96 154* 128*  BUN  --   --   --  16 20 26* 19 7  CREATININE  --   --    < > 0.80 0.81 0.76 0.85 0.83  CALCIUM  --   --   --  9.1 9.9 9.5 8.6* 8.3*  GFRNONAA  --   --   --  >60 >60  --  >60 >60  GFRAA  --   --   --  >60 >60  --  >60 >60  PROT 7.0 6.7  --  7.2 7.3  --  6.1*  --   ALBUMIN 4.3 4.3  --  3.8 4.0  --  3.2*  --   AST 19 20  --  18 18  --  19  --   ALT 18 22  --  15 16  --  13*  --   ALKPHOS 97 99  --  96 100  --  85  --   BILITOT 0.4 0.5  --  0.7 1.0  --  0.6  --  BILIDIR 0.14 0.16  --   --   --   --   --   --    < > = values in this interval not displayed.    RADIOGRAPHIC STUDIES: I have personally reviewed the radiological images as listed and agreed with the findings in the report. US Pelvis Transvanginal Non-ob (tv Only)  Result Date: 07/01/2017 T/V images.  Anteverted and heterogenous uterus measuring 5.85 x 3.87 x 3.36 cm.  Right subserous fibroid with calcifications measuring 2.7 x 2.6 cm.  Endometrial line appears thickened at 8.1 mm.  Cervix appears normal, no mass seen.  Right ovary not seen.  Left adnexal mass cystic/solid measuring 7.6 x 6.0 x 7.1 cm.  Positive color flow Doppler.  No free fluid in the posterior  cul-de-sac.  Ct Abdomen Pelvis W Contrast  Result Date: 07/12/2017 CLINICAL DATA:  Abdominal pain with nausea and vomiting. Percutaneous peritoneal implant biopsy yesterday. EXAM: CT ABDOMEN AND PELVIS WITH CONTRAST TECHNIQUE: Multidetector CT imaging of the abdomen and pelvis was performed using the standard protocol following bolus administration of intravenous contrast. CONTRAST:  173m ISOVUE-300 IOPAMIDOL (ISOVUE-300) INJECTION 61% COMPARISON:  CT abdomen pelvis dated June 27, 2017. FINDINGS: Lower chest: No acute abnormality. Hepatobiliary: No focal liver abnormality is seen. Status post cholecystectomy. No biliary dilatation. Pancreas: Unremarkable. No pancreatic ductal dilatation or surrounding inflammatory changes. Spleen: Normal in size without focal abnormality. Adrenals/Urinary Tract: The adrenal glands are unremarkable. Stable left renal cyst. No renal or ureteral calculi. The bladder is unremarkable. Stomach/Bowel: There are new multiple dilated loops of small bowel with a transition point seen in the right lower quadrant (series 3, image 52). The stomach is unremarkable. The colon is decompressed. Normal appendix. Vascular/Lymphatic: Aortic atherosclerosis. No enlarged abdominal or pelvic lymph nodes. Reproductive: Mixed cystic and solid mass in the left ovary is unchanged. The uterus and right ovary are unremarkable. Other: Slight interval increase in intra-abdominal ascites. Omental nodularity in the left lower quadrant and right abdomen is largely unchanged. There are a few foci of air around the left lower quadrant peritoneal nodularity at the site of biopsy. Musculoskeletal: Postprocedure changes in the left lower anterior abdominal wall. No acute or suspicious osseous findings. IMPRESSION: 1. New small bowel obstruction with transition point seen in the right lower quadrant, possibly secondary to adhesions related to peritoneal carcinomatosis. No pneumatosis. 2. Findings suggestive of  metastatic ovarian cancer. Slightly worsened abdominal ascites may be related to metastatic disease or reactive to the small-bowel obstruction. 3. Expected post biopsy changes in the left lower quadrant adjacent to peritoneal nodularity. No intra-abdominal hemorrhage. Electronically Signed   By: WTitus DubinM.D.   On: 07/12/2017 07:34   Ct Abdomen Pelvis W Contrast  Result Date: 06/27/2017 CLINICAL DATA:  Generalized abd pain, mild to severe at times since heart attack in June 2018. Recent ovarian cyst seen on [place] No other symptoms. EXAM: CT ABDOMEN AND PELVIS WITH CONTRAST TECHNIQUE: Multidetector CT imaging of the abdomen and pelvis was performed using the standard protocol following bolus administration of intravenous contrast. CONTRAST:  1046mISOVUE-300 IOPAMIDOL (ISOVUE-300) INJECTION 61% COMPARISON:  03/22/2017 FINDINGS: Lower chest: No acute abnormality. Hepatobiliary: No focal liver abnormality is seen. Status post cholecystectomy. No biliary dilatation. Pancreas: Unremarkable. No pancreatic ductal dilatation or surrounding inflammatory changes. Spleen: Normal in size without focal abnormality. Adrenals/Urinary Tract: Normal adrenals. Stable left upper pole renal cyst. No solid renal mass. No hydronephrosis. Urinary bladder incompletely distended. Stomach/Bowel: Stomach is incompletely distended. Small bowel is nondilated. Colon  and rectum physiologically distended. Vascular/Lymphatic: Scattered aortoiliac atheromatous calcifications without aneurysm. Portal vein patent. No abdominal or pelvic adenopathy. Reproductive: Uterus and right adnexal region unremarkable. 7.4 cm mixed solid/cystic left adnexal mass, new since prior exam. Other: There is a small amount of perihepatic and lower peritoneal ascites, slightly increased since prior. Progressive omental nodularity in the left lower quadrant and anterior right mid abdomen. No free air. Musculoskeletal: Facet DJD L4-5 allowing early grade 1  anterolisthesis. No fracture or worrisome bone lesion. IMPRESSION: 1. New 7.4 cm left adnexal mass with worsening abdominal ascites and omental nodularity, suggesting metastatic ovarian carcinoma. Consider Gyn-Onc consultation. Electronically Signed   By: Lucrezia Europe M.D.   On: 06/27/2017 11:33   Ct Biopsy  Result Date: 07/11/2017 INDICATION: 67 year old with spindle cell neoplasm of the cervix and evidence for peritoneal carcinomatosis. There is also concern for ovarian lesions. EXAM: CT-GUIDED PERITONEAL BIOPSY MEDICATIONS: None. ANESTHESIA/SEDATION: Moderate (conscious) sedation was employed during this procedure. A total of Versed 4.0 mg and Fentanyl 200 mcg was administered intravenously. Moderate Sedation Time: 44 minutes. The patient's level of consciousness and vital signs were monitored continuously by radiology nursing throughout the procedure under my direct supervision. FLUOROSCOPY TIME:  None COMPLICATIONS: None immediate. PROCEDURE: Informed written consent was obtained from the patient after a thorough discussion of the procedural risks, benefits and alternatives. All questions were addressed. A timeout was performed prior to the initiation of the procedure. Patient was placed supine on CT scanner. Images through the abdomen were obtained. The peritoneal thickening in the left lower abdomen was targeted. The anterior and left side of the abdomen was prepped with chlorhexidine and a sterile field was created. Skin was anesthetized with 1% lidocaine. 17 gauge coaxial needle was directed towards this peritoneal thickening from medial to lateral approach with CT guidance. Needle was repositioned multiple times. It was very difficult to position the coaxial needle within the peritoneal thickening without risking bowel injury. Eventually, the needle was felt to be safely along the periphery of the peritoneal cavity. A total of 3 core biopsies were obtained with an 18 core device. Tiny pieces of tissue  were collected and placed in formalin. Needle was removed without complication. FINDINGS: There is peritoneal thickening in the lateral left lower abdomen. There is also mild peritoneal thickening along the anterior abdomen and a small amount of ascites. Needle was positioned along the superficial aspect of the left lateral peritoneal cavity. Small amount of intraperitoneal gas at the biopsy site following the procedure. No significant bleeding. No evidence for bowel injury. IMPRESSION: CT-guided biopsy of the peritoneal thickening in the left lateral abdomen. Procedure was technically challenging due to the small amount of peritoneal thickening and close proximity of multiple bowel loops. False negative biopsy is possible. Electronically Signed   By: Markus Daft M.D.   On: 07/11/2017 18:16   Dg Abd Acute W/chest  Result Date: 07/14/2017 CLINICAL DATA:  History of small bowel obstruction with NG tube treatment. Patient has history of pneumonia and diverticulitis. Previous cholecystectomy. EXAM: DG ABDOMEN ACUTE W/ 1V CHEST COMPARISON:  KUB of July 12, 2017 FINDINGS: The lungs are well-expanded and clear. The heart and pulmonary vascularity are normal. The mediastinum is normal in width. There is no significant pleural effusion. The esophagogastric tube proximal port lies above the GE junction with the tip in the region of the gastric cardia. Within the abdomen there is contrast within normal caliber colon and rectum. No small bowel contrast or air is observed. There is  some gas within the stomach. There surgical clips in the gallbladder fossa. There is barium within sigmoid diverticula. The bony structures exhibit no acute abnormalities. IMPRESSION: Mild hyperinflation consistent with COPD. Advancement of the nasogastric tube by 5-10 cm is recommended. Within the abdomen there is no evidence of bowel obstruction, ileus, or perforation. There is barium within diverticula in the sigmoid colon. Electronically  Signed   By: David  Martinique M.D.   On: 07/14/2017 08:18   Dg Abd Portable 1v-small Bowel Obstruction Protocol-initial, 8 Hr Delay  Result Date: 07/12/2017 CLINICAL DATA:  Small bowel obstruction. EXAM: PORTABLE ABDOMEN - 1 VIEW COMPARISON:  Earlier the same day. FINDINGS: NG tube tip is in the stomach. Gaseous distention of small bowel noted in the mid and left upper abdomen with small bowel loops measuring up to 3.7 cm diameter. There is probably some contrast in the right colon although this is not definite. Surgical clips in the right upper quadrant suggest prior cholecystectomy. IMPRESSION: Similar distention of small bowel in the left abdomen measuring up to 3.7 cm diameter. Probable contrast in the right colon although not definite. Electronically Signed   By: Misty Stanley M.D.   On: 07/12/2017 19:44   Dg Abd Portable 1v-small Bowel Protocol-position Verification  Result Date: 07/12/2017 CLINICAL DATA:  Nasogastric tube placement EXAM: PORTABLE ABDOMEN - 1 VIEW COMPARISON:  Portable exam 0846 hours compared to CT abdomen and pelvis 07/12/2017 FINDINGS: Tip of nasogastric tube projects over proximal to mid stomach. Dilated small bowel loops in mid abdomen. Excreted contrast material within nondilated renal collecting systems and a partially distended urinary bladder. Bones demineralized. IMPRESSION: Tip of nasogastric tube projects over proximal to mid stomach. Electronically Signed   By: Lavonia Dana M.D.   On: 07/12/2017 09:23   US Abdominal Pelvic Art/vent Flow Doppler Limited  Result Date: 07/01/2017 T/V images.  Anteverted and heterogenous uterus measuring 5.85 x 3.87 x 3.36 cm.  Right subserous fibroid with calcifications measuring 2.7 x 2.6 cm.  Endometrial line appears thickened at 8.1 mm.  Cervix appears normal, no mass seen.  Right ovary not seen.  Left adnexal mass cystic/solid measuring 7.6 x 6.0 x 7.1 cm.  Positive color flow Doppler.  No free fluid in the posterior cul-de-sac.   I  spent 60 minutes counseling the patient face to face. The total time spent in the appointment was 95 minutes and more than 50% was on counseling.  All questions were answered. The patient knows to call the clinic with any problems, questions or concerns.  Heath Lark, MD 07/15/2017 3:21 PM

## 2017-07-15 NOTE — Progress Notes (Signed)
Gynecologic Oncology Multi-Disciplinary Disposition Conference Note  Date of the Conference: July 14, 2017  Patient Name: Carol Ferguson  Referring Provider: Dr. Dellis Filbert Primary GYN Oncologist: Dr. 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 Omental biopsy pending. Disposition is pending based on biopsy results.  If malignancy confirmed, disease not resectable per Dr. Denman George with plan for chemotherapy with or without radiation.   This Multidisciplinary conference took place involving physicians from [place], [place], Radiation Oncology, Pathology, Radiology along with the Gynecologic Oncology Nurse Practitioner and RN.  Comprehensive assessment of the patient's malignancy, staging, need for surgery, chemotherapy, radiation therapy, and need for further testing were reviewed. Supportive measures, both inpatient and following discharge were also discussed. The recommended plan of care is documented. Greater than 35 minutes were spent correlating and coordinating this patient's care.

## 2017-07-15 NOTE — Assessment & Plan Note (Signed)
The patient is aware she has incurable disease and treatment is strictly palliative. We discussed importance of Advanced Directives and Living will. Her niece, Rachel Moulds is her MPOA I have obtain a copy of her advance directives and will get it scanned in the computer

## 2017-07-16 ENCOUNTER — Encounter: Payer: Self-pay | Admitting: *Deleted

## 2017-07-16 ENCOUNTER — Inpatient Hospital Stay: Payer: Medicare Other

## 2017-07-16 ENCOUNTER — Telehealth: Payer: Self-pay

## 2017-07-16 ENCOUNTER — Inpatient Hospital Stay (HOSPITAL_BASED_OUTPATIENT_CLINIC_OR_DEPARTMENT_OTHER): Payer: Medicare Other | Admitting: Hematology and Oncology

## 2017-07-16 ENCOUNTER — Encounter: Payer: Self-pay | Admitting: Hematology and Oncology

## 2017-07-16 VITALS — BP 109/64 | HR 83 | Temp 97.8°F | Resp 18 | Ht 65.0 in | Wt 128.1 lb

## 2017-07-16 DIAGNOSIS — D390 Neoplasm of uncertain behavior of uterus: Secondary | ICD-10-CM

## 2017-07-16 DIAGNOSIS — R112 Nausea with vomiting, unspecified: Secondary | ICD-10-CM | POA: Insufficient documentation

## 2017-07-16 DIAGNOSIS — R634 Abnormal weight loss: Secondary | ICD-10-CM

## 2017-07-16 DIAGNOSIS — Z79899 Other long term (current) drug therapy: Secondary | ICD-10-CM

## 2017-07-16 DIAGNOSIS — R971 Elevated cancer antigen 125 [CA 125]: Secondary | ICD-10-CM | POA: Diagnosis not present

## 2017-07-16 DIAGNOSIS — F411 Generalized anxiety disorder: Secondary | ICD-10-CM | POA: Insufficient documentation

## 2017-07-16 DIAGNOSIS — C786 Secondary malignant neoplasm of retroperitoneum and peritoneum: Secondary | ICD-10-CM

## 2017-07-16 DIAGNOSIS — Z7189 Other specified counseling: Secondary | ICD-10-CM

## 2017-07-16 DIAGNOSIS — R110 Nausea: Secondary | ICD-10-CM

## 2017-07-16 DIAGNOSIS — C801 Malignant (primary) neoplasm, unspecified: Secondary | ICD-10-CM | POA: Diagnosis not present

## 2017-07-16 DIAGNOSIS — Z87891 Personal history of nicotine dependence: Secondary | ICD-10-CM | POA: Diagnosis not present

## 2017-07-16 DIAGNOSIS — C189 Malignant neoplasm of colon, unspecified: Secondary | ICD-10-CM

## 2017-07-16 DIAGNOSIS — F419 Anxiety disorder, unspecified: Secondary | ICD-10-CM

## 2017-07-16 LAB — CMP (CANCER CENTER ONLY)
ALT: 11 U/L (ref 0–55)
AST: 15 U/L (ref 5–34)
Albumin: 3.5 g/dL (ref 3.5–5.0)
Alkaline Phosphatase: 118 U/L (ref 40–150)
Anion gap: 9 (ref 3–11)
BUN: 11 mg/dL (ref 7–26)
CO2: 28 mmol/L (ref 22–29)
CREATININE: 0.85 mg/dL (ref 0.60–1.10)
Calcium: 9.4 mg/dL (ref 8.4–10.4)
Chloride: 102 mmol/L (ref 98–109)
GFR, Est AFR Am: >60 mL/min (ref >60)
GFR, Estimated: >60 mL/min (ref >60)
Glucose, Bld: 87 mg/dL (ref 70–140)
POTASSIUM: 4.1 mmol/L (ref 3.5–5.1)
Sodium: 139 mmol/L (ref 136–145)
Total Bilirubin: 0.8 mg/dL (ref 0.2–1.2)
Total Protein: 6.7 g/dL (ref 6.4–8.3)

## 2017-07-16 LAB — CEA (IN HOUSE-CHCC): CEA (CHCC-In House): 4.72 ng/mL (ref 0.00–5.00)

## 2017-07-16 MED ORDER — PROCHLORPERAZINE MALEATE 10 MG PO TABS: 10.0000 mg | ORAL_TABLET | Freq: Four times a day (QID) | ORAL | 1 refills | Status: DC | PRN

## 2017-07-16 MED ORDER — LORAZEPAM 0.5 MG PO TABS: 0.5000 mg | ORAL_TABLET | Freq: Three times a day (TID) | ORAL | 0 refills | Status: DC | PRN

## 2017-07-16 MED ORDER — ONDANSETRON HCL 8 MG PO TABS: 8.0000 mg | ORAL_TABLET | Freq: Three times a day (TID) | ORAL | 1 refills | Status: DC | PRN

## 2017-07-16 NOTE — Telephone Encounter (Signed)
Called and [employer]. They will draw CBC with diff on 3/29 with PICC placement.

## 2017-07-16 NOTE — Consult Note (Signed)
            [facility] CM Primary Care Navigator  07/16/2017  Carol Ferguson [birth year] [id number]   Attempt to see patient at the bedside to identify possible discharge needsbutshe was already discharged home per staff report.  Patient was seen and treated for small bowel obstruction.  Primary care provider's officeis listed as providingtransition of care (TOC) follow-up.   Patient has discharge instruction to follow-up with primary care provider in 1 week.   For additional questions please contact:  Edwena Felty A. Miho Monda, BSN, RN-BC [facility] PRIMARY CARE Navigator Cell: [phone]

## 2017-07-16 NOTE — Assessment & Plan Note (Signed)
She has recent profound weight loss due to her cancer diagnosis I will consult dietitian

## 2017-07-16 NOTE — Progress Notes (Signed)
[facility] OFFICE PROGRESS NOTE  Patient Care Team: Donzetta Kohut as PCP - General (Physician Assistant) [name], MD as Consulting Physician (Obstetrics and Gynecology) Mauri Pole, MD as Consulting Physician (Gastroenterology) Belva Crome, MD as Consulting Physician (Cardiology)  ASSESSMENT & PLAN:  Colon cancer [facility]) We discussed risks, benefits, side effects of palliative chemotherapy of leucovorin, 5-FU and oxaliplatin and she agreed to proceed I will omit bolus 5-FU given advanced age disease and perceived potential increased risk of side effects I will consult advanced home care for pump disconnect and symptom management at home We discussed minimum 4-6 cycles of treatment before repeat imaging study  Anxiety She has significant generalized anxiety due to her cancer diagnosis We discussed low-dose lorazepam I will also recommend social worker to talk to the patient and we discussed cancer support group  Chronic nausea 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 CBC with Differential (Cancer Center Only)    Standing Status:   Standing    Number of Occurrences:   20    Standing Expiration Date:   07/17/2018  . CMP ([place] only)    Standing Status:   Standing    Number of Occurrences:   20    Standing Expiration Date:   07/17/2018  . CEA (IN HOUSE-[facility])  . Amb Referral to Nutrition and Diabetic Education (specifically to Ernestene Kiel)    Referral Priority:    Routine    Referral Type:   Consultation    Referral Reason:   Specialty Services Required    Number of Visits Requested:   1  . Ambulatory referral to Home Health    Referral Priority:   Routine    Referral Type:   Home Health Care    Referral Reason:   Specialty Services Required    Requested Specialty:   [place]    Number of Visits Requested:   1    INTERVAL HISTORY: Please see below for problem oriented charting. She returns with her niece for further follow-up She denies recent nausea and vomiting since the last time I saw her She had a small bowel movement without the need for laxatives She would like to get further information from dietitian regarding low residual diet She also have generalized anxiety but denies depression or suicidal ideation and would like additional resources for this She has numerous questions in regards to her management and her treatment plan  SUMMARY OF ONCOLOGIC HISTORY: Oncology History   MSI stable     Colon cancer ([place])   12/17/2016 Imaging    US pelvis 1. There appears to be a 2.2 x 1.9 x 2.0 cm mass centered in the cervix, likely extending into the lower uterus. Recommend correlation with physical exam. An MRI could better evaluate. 2. Thickened endometrium measuring 12 mm. Recommend gynecologic consultation and endometrial sampling given patient age. 3. Probable fibroid in the uterus.      12/27/2016 Pathology Results    Endocervix, curettage - BENIGN ENDOCERVICAL MUCOSA AND BENIGN SQUAMOUS MUCOSA  01/01/2017 Imaging    MRI pelvis 1. No classic intermediate to high signal intensity tissue disrupting the hypointense fibrous cervical stroma as typically seen in the setting of cervical carcinoma. However, there is asymmetric thickening of the posterior cervix with anterior displacement of the cervical canal and associated widening and heterogeneity of the endometrial cavity. As such, imaging features are indeterminate by MRI  and close follow-up or tissue sampling is recommended. 2. Apparent areas of tethering/scarring between the sigmoid colon posterior uterus/cervix. Diverticuli are noted in the sigmoid colon and this may be related to scarring from prior bouts is diverticulitis. 3. Small volume intraperitoneal free fluid.      01/28/2017 Pathology Results    1. Cervix, biopsy, posterior - SPINDLE CELL LESION CONSISTENT WITH SMOOTH MUSCLE NEOPLASM, SEE COMMENT. 2. Endometrium, curettage - BENIGN SQUAMOUS MUCOSA. - BENIGN SMOOTH MUSCLE. - NO ENDOMETRIUM PRESENT. Microscopic Comment 1. There is a spindle cell lesion with focal atypia. There is no necrosis or increase in mitotic figures. The sample is somewhat limited, but the differential includes a cellular leiomyoma.      01/28/2017 Surgery    Surgery: ultrasound guided dilation of cervix with D&C and cervical biopsy  Surgeons:  Donaciano Eva, MD  Operative findings: 6cm uterus, dilated endometrial cavity, very stenotic cervical os. Thickened, fibrotic cervix without discrete mass, grossly normal cervical mucosa.         03/22/2017 Imaging    CT abdomen and pelvis 1. Diffuse prominent fluid-filled small bowel with mesenteric edema, enteritis pattern. This may be infectious or inflammatory. There is no evidence of obstruction. 2. Mild omental stranding in the left abdomen likely reactive secondary to primary bowel process, omental caking is not entirely excluded. 3. Sigmoid colonic diverticulosis without diverticulitis. 4. Prominent heterogeneous endometrium, recommend correlation with recent D and C. 5.  Aortic Atherosclerosis (ICD10-I70.0).      06/26/2017 Imaging    US pelvis T/V images. Anteverted and heterogenous uterus measuring 5.85 x 3.87 x 3.36 cm. Right subserous fibroid with calcifications measuring 2.7 x 2.6 cm. Endometrial line appears thickened at 8.1 mm. Cervix appears normal, no mass seen. Right ovary not seen. Left  adnexal mass cystic/solid measuring 7.6 x 6.0 x 7.1 cm. Positive color flow Doppler.No free fluid in the posterior cul-de-sac.      06/26/2017 Tumor Marker    Patient's tumor was tested for the following markers: CA-125 Results of the tumor marker test revealed 90      06/26/2017 Tumor Marker    Patient's tumor was tested for the following markers: CEA Results of the tumor marker test revealed 4.5      06/27/2017 Imaging    Ct abdomen and pelvis New 7.4 cm left adnexal mass with worsening abdominal ascites and omental nodularity, suggesting metastatic ovarian carcinoma.      07/02/2017 Pathology Results    Cervix, biopsy, ectocervix - SQUAMOUS MUCOSA WITH ATYPICAL INFILTRATE WITHIN FIBROMUSCULAR STROMA. - SEE MICROSCOPIC DESCRIPTION. Microscopic Comment There is a small fragment of squamous mucosa with a small portion of benign squamous epithelium. Within the fibromuscular stroma there are strands and nests of epithelioid cells, which have an infiltrating pattern and there are a few with cytoplasmic vacuoles. The findings are worrisome for an infiltrating neoplastic process and additional tissue may be indicated. There is insufficient tissue remaining in the current biopsy for additional studies.      07/11/2017 Procedure    CT-guided biopsy of the peritoneal thickening in the left lateral abdomen. Procedure was technically challenging due  to the small amount of peritoneal thickening and close proximity of multiple bowel loops. False negative biopsy is possible.      07/11/2017 Pathology Results    Soft Tissue Needle Core Biopsy, omentum - ADENOCARCINOMA. - SEE COMMENT. Microscopic Comment The malignant cells are positive for CDX-2, cytokeratin 20, and p53. They are negative for cytokeratin 7, estrogen receptor, and PAX-8. This immunohistochemical profile argues against a gynecologic primary, and the differential diagnosis includes gastrointestinal primary.      07/12/2017 Imaging     Ct abdomen and pelvis 1. New small bowel obstruction with transition point seen in the right lower quadrant, possibly secondary to adhesions related to peritoneal carcinomatosis. No pneumatosis. 2. Findings suggestive of metastatic ovarian cancer. Slightly worsened abdominal ascites may be related to metastatic disease or reactive to the small-bowel obstruction. 3. Expected post biopsy changes in the left lower quadrant adjacent to peritoneal nodularity. No intra-abdominal hemorrhage.      07/12/2017 - 07/14/2017 Hospital Admission    She was admitted for management of subacute bowel obstruction      07/15/2017 Cancer Staging    Staging form: Colon and Rectum, AJCC 8th Edition - Clinical: Stage Unknown (cTX, cNX, pM1) - Signed by Heath Lark, MD on 07/15/2017       REVIEW OF SYSTEMS:   Constitutional: Denies fevers, chills or abnormal weight loss Eyes: Denies blurriness of vision Ears, nose, mouth, throat, and face: Denies mucositis or sore throat Respiratory: Denies cough, dyspnea or wheezes Cardiovascular: Denies palpitation, chest discomfort or lower extremity swelling Skin: Denies abnormal skin rashes Lymphatics: Denies new lymphadenopathy or easy bruising Neurological:Denies numbness, tingling or new weaknesses All other systems were reviewed with the patient and are negative.  I have reviewed the past medical history, past surgical history, social history and family history with the patient and they are unchanged from previous note.  ALLERGIES:  is allergic to flagyl [metronidazole].  MEDICATIONS:  Current Outpatient Medications  Medication Sig Dispense Refill  . aspirin 81 MG chewable tablet Chew 1 tablet (81 mg total) by mouth daily. 30 tablet 11  . atorvastatin (LIPITOR) 10 MG tablet Take 1 tablet (10 mg total) by mouth daily. 90 tablet 3  . BRILINTA 90 MG TABS tablet TAKE 1 TABLET BY MOUTH TWICE A DAY 60 tablet 10  . dicyclomine (BENTYL) 10 MG capsule TAKE 1 CAPSULE BY  MOUTH EVERY 8 HOURS AS NEEDED FOR SPASMS. FOR ABDOMINAL DISCOMFORT 90 capsule 5  . FLUoxetine (PROZAC) 10 MG capsule TAKE 1 CAPSULE BY MOUTH EVERY DAY 90 capsule 0  . LORazepam (ATIVAN) 0.5 MG tablet Take 1 tablet (0.5 mg total) by mouth every 8 (eight) hours as needed for anxiety. 60 tablet 0  . nitroGLYCERIN (NITROSTAT) 0.4 MG SL tablet Place 1 tablet (0.4 mg total) under the tongue every 5 (five) minutes x 3 doses as needed for chest pain. 25 tablet 11  . omeprazole (PRILOSEC) 20 MG capsule Take 20 mg by mouth daily as needed.     . ondansetron (ZOFRAN) 4 MG tablet Take 1 tablet (4 mg total) by mouth every 8 (eight) hours as needed for nausea or vomiting. 30 tablet 1  . ondansetron (ZOFRAN) 8 MG tablet Take 1 tablet (8 mg total) by mouth every 8 (eight) hours as needed for refractory nausea / vomiting. Start on day 3 after chemotherapy. 30 tablet 1  . prochlorperazine (COMPAZINE) 10 MG tablet Take 1 tablet (10 mg total) by mouth every 6 (six) hours as needed (Nausea or vomiting).  30 tablet 1  . traZODone (DESYREL) 50 MG tablet Take 0.5-1 tablets (25-50 mg total) at bedtime as needed by mouth for sleep. 30 tablet 2   No current facility-administered medications for this visit.     PHYSICAL EXAMINATION: ECOG PERFORMANCE STATUS: 1 - Symptomatic but completely ambulatory  Vitals:   07/16/17 1129  BP: 109/64  Pulse: 83  Resp: 18  Temp: 97.8 F (36.6 C)  SpO2: 99%   Filed Weights   07/16/17 1129  Weight: 128 lb 1.6 oz (58.1 kg)    GENERAL:alert, no distress and comfortable NEURO: alert & oriented x 3 with fluent speech, no focal motor/sensory deficits  LABORATORY DATA:  I have reviewed the data as listed    Component Value Date/Time   NA 139 07/16/2017 0957   K 4.1 07/16/2017 0957   CL 102 07/16/2017 0957   CO2 28 07/16/2017 0957   GLUCOSE 87 07/16/2017 0957   BUN 11 07/16/2017 0957   CREATININE 0.85 07/16/2017 0957   CALCIUM 9.4 07/16/2017 0957   PROT 6.7 07/16/2017 0957    PROT 6.7 11/28/2016 0849   ALBUMIN 3.5 07/16/2017 0957   ALBUMIN 4.3 11/28/2016 0849   AST 15 07/16/2017 0957   ALT 11 07/16/2017 0957   ALKPHOS 118 07/16/2017 0957   BILITOT 0.8 07/16/2017 0957   GFRNONAA >60 07/16/2017 0957   GFRAA >60 07/16/2017 0957    No results found for: SPEP, UPEP  Lab Results  Component Value Date   WBC 7.5 07/13/2017   NEUTROABS 3.5 07/11/2017   HGB 13.1 07/13/2017   HCT 39.3 07/13/2017   MCV 98.0 07/13/2017   PLT 220 07/13/2017      Chemistry      Component Value Date/Time   NA 139 07/16/2017 0957   K 4.1 07/16/2017 0957   CL 102 07/16/2017 0957   CO2 28 07/16/2017 0957   BUN 11 07/16/2017 0957   CREATININE 0.85 07/16/2017 0957      Component Value Date/Time   CALCIUM 9.4 07/16/2017 0957   ALKPHOS 118 07/16/2017 0957   AST 15 07/16/2017 0957   ALT 11 07/16/2017 0957   BILITOT 0.8 07/16/2017 0957       RADIOGRAPHIC STUDIES: I have personally reviewed the radiological images as listed and agreed with the findings in the report. US Pelvis Transvanginal Non-ob (tv Only)  Result Date: 07/01/2017 T/V images.  Anteverted and heterogenous uterus measuring 5.85 x 3.87 x 3.36 cm.  Right subserous fibroid with calcifications measuring 2.7 x 2.6 cm.  Endometrial line appears thickened at 8.1 mm.  Cervix appears normal, no mass seen.  Right ovary not seen.  Left adnexal mass cystic/solid measuring 7.6 x 6.0 x 7.1 cm.  Positive color flow Doppler.  No free fluid in the posterior cul-de-sac.  Ct Abdomen Pelvis W Contrast  Result Date: 07/12/2017 CLINICAL DATA:  Abdominal pain with nausea and vomiting. Percutaneous peritoneal implant biopsy yesterday. EXAM: CT ABDOMEN AND PELVIS WITH CONTRAST TECHNIQUE: Multidetector CT imaging of the abdomen and pelvis was performed using the standard protocol following bolus administration of intravenous contrast. CONTRAST:  159m ISOVUE-300 IOPAMIDOL (ISOVUE-300) INJECTION 61% COMPARISON:  CT abdomen pelvis dated  June 27, 2017. FINDINGS: Lower chest: No acute abnormality. Hepatobiliary: No focal liver abnormality is seen. Status post cholecystectomy. No biliary dilatation. Pancreas: Unremarkable. No pancreatic ductal dilatation or surrounding inflammatory changes. Spleen: Normal in size without focal abnormality. Adrenals/Urinary Tract: The adrenal glands are unremarkable. Stable left renal cyst. No renal or ureteral calculi. The  bladder is unremarkable. Stomach/Bowel: There are new multiple dilated loops of small bowel with a transition point seen in the right lower quadrant (series 3, image 52). The stomach is unremarkable. The colon is decompressed. Normal appendix. Vascular/Lymphatic: Aortic atherosclerosis. No enlarged abdominal or pelvic lymph nodes. Reproductive: Mixed cystic and solid mass in the left ovary is unchanged. The uterus and right ovary are unremarkable. Other: Slight interval increase in intra-abdominal ascites. Omental nodularity in the left lower quadrant and right abdomen is largely unchanged. There are a few foci of air around the left lower quadrant peritoneal nodularity at the site of biopsy. Musculoskeletal: Postprocedure changes in the left lower anterior abdominal wall. No acute or suspicious osseous findings. IMPRESSION: 1. New small bowel obstruction with transition point seen in the right lower quadrant, possibly secondary to adhesions related to peritoneal carcinomatosis. No pneumatosis. 2. Findings suggestive of metastatic ovarian cancer. Slightly worsened abdominal ascites may be related to metastatic disease or reactive to the small-bowel obstruction. 3. Expected post biopsy changes in the left lower quadrant adjacent to peritoneal nodularity. No intra-abdominal hemorrhage. Electronically Signed   By: Titus Dubin M.D.   On: 07/12/2017 07:34   Ct Abdomen Pelvis W Contrast  Result Date: 06/27/2017 CLINICAL DATA:  Generalized abd pain, mild to severe at times since heart attack in June  2018. Recent ovarian cyst seen on [place]. No other symptoms. EXAM: CT ABDOMEN AND PELVIS WITH CONTRAST TECHNIQUE: Multidetector CT imaging of the abdomen and pelvis was performed using the standard protocol following bolus administration of intravenous contrast. CONTRAST:  139m ISOVUE-300 IOPAMIDOL (ISOVUE-300) INJECTION 61% COMPARISON:  03/22/2017 FINDINGS: Lower chest: No acute abnormality. Hepatobiliary: No focal liver abnormality is seen. Status post cholecystectomy. No biliary dilatation. Pancreas: Unremarkable. No pancreatic ductal dilatation or surrounding inflammatory changes. Spleen: Normal in size without focal abnormality. Adrenals/Urinary Tract: Normal adrenals. Stable left upper pole renal cyst. No solid renal mass. No hydronephrosis. Urinary bladder incompletely distended. Stomach/Bowel: Stomach is incompletely distended. Small bowel is nondilated. Colon and rectum physiologically distended. Vascular/Lymphatic: Scattered aortoiliac atheromatous calcifications without aneurysm. Portal vein patent. No abdominal or pelvic adenopathy. Reproductive: Uterus and right adnexal region unremarkable. 7.4 cm mixed solid/cystic left adnexal mass, new since prior exam. Other: There is a small amount of perihepatic and lower peritoneal ascites, slightly increased since prior. Progressive omental nodularity in the left lower quadrant and anterior right mid abdomen. No free air. 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 No fracture or worrisome bone lesion. IMPRESSION: 1. New 7.4 cm left adnexal mass with worsening abdominal ascites and omental nodularity, suggesting metastatic ovarian carcinoma. Consider Gyn-Onc consultation. Electronically Signed   By: DLucrezia EuropeM.D.   On: 06/27/2017 11:33   Ct Biopsy  Result Date: 07/11/2017 INDICATION: 67year old with spindle cell neoplasm of the cervix and evidence for peritoneal carcinomatosis. There is also concern for ovarian lesions. EXAM:  CT-GUIDED PERITONEAL BIOPSY MEDICATIONS: None. ANESTHESIA/SEDATION: Moderate (conscious) sedation was employed during this procedure. A total of Versed 4.0 mg and Fentanyl 200 mcg was administered intravenously. Moderate Sedation Time: 44 minutes. The patient's level of consciousness and vital signs were monitored continuously by radiology nursing throughout the procedure under my direct supervision. FLUOROSCOPY TIME:  None COMPLICATIONS: None immediate. PROCEDURE: Informed written consent was obtained from the patient after a thorough discussion of the procedural risks, benefits and alternatives. All questions were addressed. A timeout was performed prior to the initiation of the procedure. Patient was placed supine on CT scanner. Images through the abdomen  were obtained. The peritoneal thickening in the left lower abdomen was targeted. The anterior and left side of the abdomen was prepped with chlorhexidine and a sterile field was created. Skin was anesthetized with 1% lidocaine. 17 gauge coaxial needle was directed towards this peritoneal thickening from medial to lateral approach with CT guidance. Needle was repositioned multiple times. It was very difficult to position the coaxial needle within the peritoneal thickening without risking bowel injury. Eventually, the needle was felt to be safely along the periphery of the peritoneal cavity. A total of 3 core biopsies were obtained with an 18 core device. Tiny pieces of tissue were collected and placed in formalin. Needle was removed without complication. FINDINGS: There is peritoneal thickening in the lateral left lower abdomen. There is also mild peritoneal thickening along the anterior abdomen and a small amount of ascites. Needle was positioned along the superficial aspect of the left lateral peritoneal cavity. Small amount of intraperitoneal gas at the biopsy site following the procedure. No significant bleeding. No evidence for bowel injury. IMPRESSION:  CT-guided biopsy of the peritoneal thickening in the left lateral abdomen. Procedure was technically challenging due to the small amount of peritoneal thickening and close proximity of multiple bowel loops. False negative biopsy is possible. Electronically Signed   By: Markus Daft M.D.   On: 07/11/2017 18:16   Dg Abd Acute W/chest  Result Date: 07/14/2017 CLINICAL DATA:  History of small bowel obstruction with NG tube treatment. Patient has history of pneumonia and diverticulitis. Previous cholecystectomy. EXAM: DG ABDOMEN ACUTE W/ 1V CHEST COMPARISON:  KUB of July 12, 2017 FINDINGS: The lungs are well-expanded and clear. The heart and pulmonary vascularity are normal. The mediastinum is normal in width. There is no significant pleural effusion. The esophagogastric tube proximal port lies above the GE junction with the tip in the region of the gastric cardia. Within the abdomen there is contrast within normal caliber colon and rectum. No small bowel contrast or air is observed. There is some gas within the stomach. There surgical clips in the gallbladder fossa. There is barium within sigmoid diverticula. The bony structures exhibit no acute abnormalities. IMPRESSION: Mild hyperinflation consistent with COPD. Advancement of the nasogastric tube by 5-10 cm is recommended. Within the abdomen there is no evidence of bowel obstruction, ileus, or perforation. There is barium within diverticula in the sigmoid colon. Electronically Signed   By: David  Martinique M.D.   On: 07/14/2017 08:18   Dg Abd Portable 1v-small Bowel Obstruction Protocol-initial, 8 Hr Delay  Result Date: 07/12/2017 CLINICAL DATA:  Small bowel obstruction. EXAM: PORTABLE ABDOMEN - 1 VIEW COMPARISON:  Earlier the same day. FINDINGS: NG tube tip is in the stomach. Gaseous distention of small bowel noted in the mid and left upper abdomen with small bowel loops measuring up to 3.7 cm diameter. There is probably some contrast in the right colon although  this is not definite. Surgical clips in the right upper quadrant suggest prior cholecystectomy. IMPRESSION: Similar distention of small bowel in the left abdomen measuring up to 3.7 cm diameter. Probable contrast in the right colon although not definite. Electronically Signed   By: Misty Stanley M.D.   On: 07/12/2017 19:44   Dg Abd Portable 1v-small Bowel Protocol-position Verification  Result Date: 07/12/2017 CLINICAL DATA:  Nasogastric tube placement EXAM: PORTABLE ABDOMEN - 1 VIEW COMPARISON:  Portable exam 0846 hours compared to CT abdomen and pelvis 07/12/2017 FINDINGS: Tip of nasogastric tube projects over proximal to mid stomach. Dilated small  bowel loops in mid abdomen. Excreted contrast material within nondilated renal collecting systems and a partially distended urinary bladder. Bones demineralized. IMPRESSION: Tip of nasogastric tube projects over proximal to mid stomach. Electronically Signed   By: Lavonia Dana M.D.   On: 07/12/2017 09:23   US Abdominal Pelvic Art/vent Flow Doppler Limited  Result Date: 07/01/2017 T/V images.  Anteverted and heterogenous uterus measuring 5.85 x 3.87 x 3.36 cm.  Right subserous fibroid with calcifications measuring 2.7 x 2.6 cm.  Endometrial line appears thickened at 8.1 mm.  Cervix appears normal, no mass seen.  Right ovary not seen.  Left adnexal mass cystic/solid measuring 7.6 x 6.0 x 7.1 cm.  Positive color flow Doppler.  No free fluid in the posterior cul-de-sac.   All questions were answered. The patient knows to call the clinic with any problems, questions or concerns. No barriers to learning was detected.  I spent 30 minutes counseling the patient face to face. The total time spent in the appointment was 40 minutes and more than 50% was on counseling and review of test results  Heath Lark, MD 07/16/2017 12:36 PM

## 2017-07-16 NOTE — Progress Notes (Signed)
[place] Work  Clinical Social Work was referred by Futures trader for generalized anxiwty.  CSW contacted patient by phone.  Patient shared "her story" as it related to diagnosis and reported feeling out of control, "hopeless at times", and anxious.  CSW normalized and validated patients feelings and discussed support options.  CSW schedule counseling session with patient for 4/1 at 1:30 in [place] office.  Maryjean Morn, MSW, LCSW, OSW-C Clinical Social Worker [facility] [phone]

## 2017-07-16 NOTE — Assessment & Plan Note (Signed)
We discussed goals of care again She understood the goals of treatment is strictly palliative

## 2017-07-16 NOTE — Assessment & Plan Note (Signed)
We discussed risks, benefits, side effects of palliative chemotherapy of leucovorin, 5-FU and oxaliplatin and she agreed to proceed I will omit bolus 5-FU given advanced age disease and perceived potential increased risk of side effects I will consult [facility] for pump disconnect and symptom management at home We discussed minimum 4-6 cycles of treatment before repeat imaging study

## 2017-07-16 NOTE — Assessment & Plan Note (Signed)
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

## 2017-07-16 NOTE — Assessment & Plan Note (Signed)
She has significant generalized anxiety due to her cancer diagnosis We discussed low-dose lorazepam I will also recommend social worker to talk to the patient and we discussed cancer support group

## 2017-07-18 ENCOUNTER — Other Ambulatory Visit: Payer: Self-pay | Admitting: Hematology and Oncology

## 2017-07-18 ENCOUNTER — Ambulatory Visit (HOSPITAL_COMMUNITY)
Admission: RE | Admit: 2017-07-18 | Discharge: 2017-07-18 | Disposition: A | Discharge status: Home / Self Care | Payer: Medicare Other | Source: Ambulatory Visit | Attending: Hematology and Oncology | Admitting: Hematology and Oncology

## 2017-07-18 DIAGNOSIS — C189 Malignant neoplasm of colon, unspecified: Secondary | ICD-10-CM | POA: Diagnosis not present

## 2017-07-18 DIAGNOSIS — Z452 Encounter for adjustment and management of vascular access device: Secondary | ICD-10-CM | POA: Diagnosis not present

## 2017-07-18 DIAGNOSIS — C786 Secondary malignant neoplasm of retroperitoneum and peritoneum: Secondary | ICD-10-CM

## 2017-07-18 DIAGNOSIS — C801 Malignant (primary) neoplasm, unspecified: Secondary | ICD-10-CM | POA: Insufficient documentation

## 2017-07-18 MED ORDER — HEPARIN SOD (PORK) LOCK FLUSH 100 UNIT/ML IV SOLN
INTRAVENOUS | Status: AC
  Filled 2017-07-18: qty 5

## 2017-07-18 MED ORDER — LIDOCAINE HCL (PF) 1 % IJ SOLN
INTRAMUSCULAR | Status: AC
  Filled 2017-07-18: qty 30

## 2017-07-18 NOTE — Procedures (Signed)
Successful placement of single lumen PICC line to right basilic vein. Length 39 cm Tip at lower SVC/RA No complications Ready for use.   Ascencion Dike PA-C 07/18/2017 4:19 PM

## 2017-07-21 ENCOUNTER — Inpatient Hospital Stay: Payer: Medicare Other

## 2017-07-21 ENCOUNTER — Inpatient Hospital Stay: Payer: Medicare Other | Attending: Gynecologic Oncology

## 2017-07-21 VITALS — BP 113/64 | HR 82 | Temp 97.6°F | Resp 17 | Wt 121.2 lb

## 2017-07-21 DIAGNOSIS — E441 Mild protein-calorie malnutrition: Secondary | ICD-10-CM | POA: Insufficient documentation

## 2017-07-21 DIAGNOSIS — I252 Old myocardial infarction: Secondary | ICD-10-CM | POA: Diagnosis not present

## 2017-07-21 DIAGNOSIS — G629 Polyneuropathy, unspecified: Secondary | ICD-10-CM | POA: Insufficient documentation

## 2017-07-21 DIAGNOSIS — C786 Secondary malignant neoplasm of retroperitoneum and peritoneum: Secondary | ICD-10-CM | POA: Insufficient documentation

## 2017-07-21 DIAGNOSIS — C189 Malignant neoplasm of colon, unspecified: Secondary | ICD-10-CM

## 2017-07-21 DIAGNOSIS — C19 Malignant neoplasm of rectosigmoid junction: Secondary | ICD-10-CM | POA: Diagnosis not present

## 2017-07-21 DIAGNOSIS — Z87891 Personal history of nicotine dependence: Secondary | ICD-10-CM | POA: Insufficient documentation

## 2017-07-21 DIAGNOSIS — D390 Neoplasm of uncertain behavior of uterus: Secondary | ICD-10-CM | POA: Insufficient documentation

## 2017-07-21 DIAGNOSIS — R112 Nausea with vomiting, unspecified: Secondary | ICD-10-CM | POA: Insufficient documentation

## 2017-07-21 DIAGNOSIS — Z79899 Other long term (current) drug therapy: Secondary | ICD-10-CM | POA: Insufficient documentation

## 2017-07-21 DIAGNOSIS — R1030 Lower abdominal pain, unspecified: Secondary | ICD-10-CM | POA: Diagnosis not present

## 2017-07-21 DIAGNOSIS — R634 Abnormal weight loss: Secondary | ICD-10-CM | POA: Diagnosis not present

## 2017-07-21 DIAGNOSIS — R971 Elevated cancer antigen 125 [CA 125]: Secondary | ICD-10-CM | POA: Insufficient documentation

## 2017-07-21 LAB — CBC WITH DIFFERENTIAL (CANCER CENTER ONLY)
BASOS PCT: 0 %
Basophils Absolute: 0 10*3/uL (ref 0.0–0.1)
EOS PCT: 1 %
Eosinophils Absolute: 0 10*3/uL (ref 0.0–0.5)
HCT: 43.9 % (ref 34.8–46.6)
HEMOGLOBIN: 15 g/dL (ref 11.6–15.9)
LYMPHS ABS: 0.8 10*3/uL — AB (ref 0.9–3.3)
Lymphocytes Relative: 13 %
MCH: 33.2 pg (ref 25.1–34.0)
MCHC: 34.2 g/dL (ref 31.5–36.0)
MCV: 97.1 fL (ref 79.5–101.0)
MONOS PCT: 8 %
Monocytes Absolute: 0.6 10*3/uL (ref 0.1–0.9)
NEUTROS PCT: 78 %
Neutro Abs: 5.1 10*3/uL (ref 1.5–6.5)
Platelet Count: 250 10*3/uL (ref 145–400)
RBC: 4.52 MIL/uL (ref 3.70–5.45)
RDW: 13.3 % (ref 11.2–14.5)
WBC Count: 6.6 10*3/uL (ref 3.9–10.3)

## 2017-07-21 MED ORDER — DEXAMETHASONE SODIUM PHOSPHATE 10 MG/ML IJ SOLN
10.0000 mg | Freq: Once | INTRAMUSCULAR | Status: AC
  Administered 2017-07-21: 10 mg via INTRAVENOUS

## 2017-07-21 MED ORDER — DEXTROSE 5 % IV SOLN
Freq: Once | INTRAVENOUS | Status: AC
  Administered 2017-07-21: 08:00:00 via INTRAVENOUS

## 2017-07-21 MED ORDER — OXALIPLATIN CHEMO INJECTION 100 MG/20ML
85.0000 mg/m2 | Freq: Once | INTRAVENOUS | Status: AC
  Administered 2017-07-21: 135 mg via INTRAVENOUS
  Filled 2017-07-21: qty 20

## 2017-07-21 MED ORDER — LEUCOVORIN CALCIUM INJECTION 350 MG: 400.0000 mg/m2 | Freq: Once | INTRAVENOUS | Status: DC

## 2017-07-21 MED ORDER — SODIUM CHLORIDE 0.9 % IV SOLN: 2400.0000 mg/m2 | INTRAVENOUS | Status: DC

## 2017-07-21 MED ORDER — DEXAMETHASONE SODIUM PHOSPHATE 10 MG/ML IJ SOLN
INTRAMUSCULAR | Status: AC
  Filled 2017-07-21: qty 1

## 2017-07-21 MED ORDER — OXALIPLATIN CHEMO INJECTION 100 MG/20ML: 85.0000 mg/m2 | Freq: Once | INTRAVENOUS | Status: DC

## 2017-07-21 MED ORDER — PROCHLORPERAZINE MALEATE 10 MG PO TABS
10.0000 mg | ORAL_TABLET | Freq: Once | ORAL | Status: AC
  Administered 2017-07-21: 10 mg via ORAL

## 2017-07-21 MED ORDER — PROCHLORPERAZINE MALEATE 10 MG PO TABS
ORAL_TABLET | ORAL | Status: AC
  Filled 2017-07-21: qty 1

## 2017-07-21 MED ORDER — LEUCOVORIN CALCIUM INJECTION 350 MG
400.0000 mg/m2 | Freq: Once | INTRAVENOUS | Status: AC
  Administered 2017-07-21: 636 mg via INTRAVENOUS
  Filled 2017-07-21: qty 31.8

## 2017-07-21 MED ORDER — PALONOSETRON HCL INJECTION 0.25 MG/5ML
0.2500 mg | Freq: Once | INTRAVENOUS | Status: AC
  Administered 2017-07-21: 0.25 mg via INTRAVENOUS

## 2017-07-21 MED ORDER — SODIUM CHLORIDE 0.9 % IV SOLN
2400.0000 mg/m2 | INTRAVENOUS | Status: DC
  Administered 2017-07-21: 3800 mg via INTRAVENOUS
  Filled 2017-07-21: qty 76

## 2017-07-21 MED ORDER — PALONOSETRON HCL INJECTION 0.25 MG/5ML
INTRAVENOUS | Status: AC
Start: 2017-07-21 — End: 2017-07-21
  Filled 2017-07-21: qty 5

## 2017-07-21 NOTE — Patient Instructions (Signed)
[facility] Discharge Instructions for Patients Receiving Chemotherapy  Today you received the following chemotherapy agents Oxaliplatin, Leucovorin, 5FU  To help prevent nausea and vomiting after your treatment, we encourage you to take your nausea medication as prescribed.   If you develop nausea and vomiting that is not controlled by your nausea medication, call the clinic.   BELOW ARE SYMPTOMS THAT SHOULD BE REPORTED IMMEDIATELY:  *FEVER GREATER THAN 100.5 F  *CHILLS WITH OR WITHOUT FEVER  NAUSEA AND VOMITING THAT IS NOT CONTROLLED WITH YOUR NAUSEA MEDICATION  *UNUSUAL SHORTNESS OF BREATH  *UNUSUAL BRUISING OR BLEEDING  TENDERNESS IN MOUTH AND THROAT WITH OR WITHOUT PRESENCE OF ULCERS  *URINARY PROBLEMS  *BOWEL PROBLEMS  UNUSUAL RASH Items with * indicate a potential emergency and should be followed up as soon as possible.  Feel free to call the clinic should you have any questions or concerns. The clinic phone number is [phone].  Please show the CHEMO ALERT CARD at check-in to the Emergency Department and triage nurse.  Oxaliplatin Injection What is this medicine? OXALIPLATIN (ox AL i PLA tin) is a chemotherapy drug. It targets fast dividing cells, like cancer cells, and causes these cells to die. This medicine is used to treat cancers of the colon and rectum, and many other cancers. This medicine may be used for other purposes; ask your health care provider or pharmacist if you have questions. COMMON BRAND NAME(S): Eloxatin What should I tell my health care provider before I take this medicine? They need to know if you have any of these conditions: -kidney disease -an unusual or allergic reaction to oxaliplatin, other chemotherapy, other medicines, foods, dyes, or preservatives -pregnant or trying to get pregnant -breast-feeding How should I use this medicine? This drug is given as an infusion into a vein. It is administered in a hospital or  clinic by a specially trained health care professional. Talk to your pediatrician regarding the use of this medicine in children. Special care may be needed. Overdosage: If you think you have taken too much of this medicine contact a poison control center or emergency room at once. NOTE: This medicine is only for you. Do not share this medicine with others. What if I miss a dose? It is important not to miss a dose. Call your doctor or health care professional if you are unable to keep an appointment. What may interact with this medicine? -medicines to increase blood counts like filgrastim, pegfilgrastim, sargramostim -probenecid -some antibiotics like amikacin, gentamicin, neomycin, polymyxin B, streptomycin, tobramycin -zalcitabine Talk to your doctor or health care professional before taking any of these medicines: -acetaminophen -aspirin -ibuprofen -ketoprofen -naproxen This list may not describe all possible interactions. Give your health care provider a list of all the medicines, herbs, non-prescription drugs, or dietary supplements you use. Also tell them if you smoke, drink alcohol, or use illegal drugs. Some items may interact with your medicine. What should I watch for while using this medicine? Your condition will be monitored carefully while you are receiving this medicine. You will need important blood work done while you are taking this medicine. This medicine can make you more sensitive to cold. Do not drink cold drinks or use ice. Cover exposed skin before coming in contact with cold temperatures or cold objects. When out in cold weather wear warm clothing and cover your mouth and nose to warm the air that goes into your lungs. Tell your doctor if you get sensitive to the cold. This   drug may make you feel generally unwell. This is not uncommon, as chemotherapy can affect healthy cells as well as cancer cells. Report any side effects. Continue your course of treatment even though  you feel ill unless your doctor tells you to stop. In some cases, you may be given additional medicines to help with side effects. Follow all directions for their use. Call your doctor or health care professional for advice if you get a fever, chills or sore throat, or other symptoms of a cold or flu. Do not treat yourself. This drug decreases your body's ability to fight infections. Try to avoid being around people who are sick. This medicine may increase your risk to bruise or bleed. Call your doctor or health care professional if you notice any unusual bleeding. Be careful brushing and flossing your teeth or using a toothpick because you may get an infection or bleed more easily. If you have any dental work done, tell your dentist you are receiving this medicine. Avoid taking products that contain aspirin, acetaminophen, ibuprofen, naproxen, or ketoprofen unless instructed by your doctor. These medicines may hide a fever. Do not become pregnant while taking this medicine. Women should inform their doctor if they wish to become pregnant or think they might be pregnant. There is a potential for serious side effects to an unborn child. Talk to your health care professional or pharmacist for more information. Do not breast-feed an infant while taking this medicine. Call your doctor or health care professional if you get diarrhea. Do not treat yourself. What side effects may I notice from receiving this medicine? Side effects that you should report to your doctor or health care professional as soon as possible: -allergic reactions like skin rash, itching or hives, swelling of the face, lips, or tongue -low blood counts - This drug may decrease the number of white blood cells, red blood cells and platelets. You may be at increased risk for infections and bleeding. -signs of infection - fever or chills, cough, sore throat, pain or difficulty passing urine -signs of decreased platelets or bleeding -  bruising, pinpoint red spots on the skin, black, tarry stools, nosebleeds -signs of decreased red blood cells - unusually weak or tired, fainting spells, lightheadedness -breathing problems -chest pain, pressure -cough -diarrhea -jaw tightness -mouth sores -nausea and vomiting -pain, swelling, redness or irritation at the injection site -pain, tingling, numbness in the hands or feet -problems with balance, talking, walking -redness, blistering, peeling or looLeucovorin injection What is this medicine? LEUCOVORIN (loo koe VOR in) is used to prevent or treat the harmful effects of some medicines. This medicine is used to treat anemia caused by a low amount of folic acid in the body. It is also used with 5-fluorouracil (5-FU) to treat colon cancer. This medicine may be used for other purposes; ask your health care provider or pharmacist if you have questions. What should I tell my health care provider before I take this medicine? They need to know if you have any of these conditions: -anemia from low levels of vitamin B-12 in the blood -an unusual or allergic reaction to leucovorin, folic acid, other medicines, foods, dyes, or preservatives -pregnant or trying to get pregnant -breast-feeding How should I use this medicine? This medicine is for injection into a muscle or into a vein. It is given by a health care professional in a hospital or clinic setting. Talk to your pediatrician regarding the use of this medicine in children. Special care may be needed.  Overdosage: If you think you have taken too much of this medicine contact a poison control center or emergency room at once. NOTE: This medicine is only for you. Do not share this medicine with others. What if I miss a dose? This does not apply. What may interact with this medicine? -capecitabine -fluorouracil -phenobarbital -phenytoin -primidone -trimethoprim-sulfamethoxazole This list may not describe all possible interactions.  Give your health care provider a list of all the medicines, herbs, non-prescription drugs, or dietary supplements you use. Also tell them if you smoke, drink alcohol, or use illegal drugs. Some items may interact with your medicine. What should I watch for while using this medicine? Your condition will be monitored carefully while you are receiving this medicine. This medicine may increase the side effects of 5-fluorouracil, 5-FU. Tell your doctor or health care professional if you have diarrhea or mouth sores that do not get better or that get worse. What side effects may I notice from receiving this medicine? Side effects that you should report to your doctor or health care professional as soon as possible: -allergic reactions like skin rash, itching or hives, swelling of the face, lips, or tongue -breathing problems -fever, infection -mouth sores -unusual bleeding or bruising -unusually weak or tired Side effects that usually do not require medical attention (report to your doctor or health care professional if they continue or are bothersome): -constipation or diarrhea -loss of appetite -nausea, vomiting This list may not describe all possible side effects. Call your doctor for medical advice about side effects. You may report side effects to FDA at 1-800-FDA-1088. Where should I keep my medicine? This drug is given in a hospital or clinic and will not be stored at home. NOTE: This sheet is a summary. It may not cover all possible information. If you have questions about this medicine, talk to your doctor, pharmacist, or health care provider.  2018 Elsevier/Gold Standard (2007-10-13 16:50:29) sening of the skin, including inside the mouth -trouble passing urine or change in the amount of urine Side effects that usually do not require medical attention (report to your doctor or health care professional if they continue or are bothersome): -changes in vision -constipation -hair  loss -loss of appetite -metallic taste in the mouth or changes in taste -stomach pain This list may not describe all possible side effects. Call your doctor for medical advice about side effects. You may report side effects to FDA at 1-800-FDA-1088. Where should I keep my medicine? This drug is given in a hospital or clinic and will not be stored at home. NOTE: This sheet is a summary. It may not cover all possible information. If you have questions about this medicine, talk to your doctor, pharmacist, or health care provider.  2018 Elsevier/Gold Standard (2007-11-03 17:22:47)  Fluorouracil, 5FU; Diclofenac topical cream What is this medicine? FLUOROURACIL; DICLOFENAC (flure oh YOOR a sil; dye KLOE fen ak) is a combination of a topical chemotherapy agent and non-steroidal anti-inflammatory drug (NSAID). It is used on the skin to treat skin cancer and skin conditions that could become cancer. This medicine may be used for other purposes; ask your health care provider or pharmacist if you have questions. COMMON BRAND NAME(S): FLUORAC What should I tell my health care provider before I take this medicine? They need to know if you have any of these conditions: -bleeding problems -cigarette smoker -DPD enzyme deficiency -heart disease -high blood pressure -if you frequently drink alcohol containing drinks -kidney disease -liver disease -open or infected  skin -stomach problems -swelling or open sores at the treatment site -recent or planned coronary artery bypass graft (CABG) surgery -an unusual or allergic reaction to fluorouracil, diclofenac, aspirin, other NSAIDs, other medicines, foods, dyes, or preservatives -pregnant or trying to get pregnant -breast-feeding How should I use this medicine? This medicine is only for use on the skin. Follow the directions on the prescription label. Wash hands before and after use. Wash affected area and gently pat dry. To apply this medicine use a  cotton-tipped applicator, or use gloves if applying with fingertips. If applied with unprotected fingertips, it is very important to wash your hands well after you apply this medicine. Avoid applying to the eyes, nose, or mouth. Apply enough medicine to cover the affected area. You can cover the area with a light gauze dressing, but do not use tight or air-tight dressings. Finish the full course prescribed by your doctor or health care professional, even if you think your condition is better. Do not stop taking except on the advice of your doctor or health care professional. Talk to your pediatrician regarding the use of this medicine in children. Special care may be needed. Overdosage: If you think you have taken too much of this medicine contact a poison control center or emergency room at once. NOTE: This medicine is only for you. Do not share this medicine with others. What if I miss a dose? If you miss a dose, apply it as soon as you can. If it is almost time for your next dose, only use that dose. Do not apply extra doses. Contact your doctor or health care professional if you miss more than one dose. What may interact with this medicine? Interactions are not expected. Do not use any other skin products without telling your doctor or health care professional. This list may not describe all possible interactions. Give your health care provider a list of all the medicines, herbs, non-prescription drugs, or dietary supplements you use. Also tell them if you smoke, drink alcohol, or use illegal drugs. Some items may interact with your medicine. What should I watch for while using this medicine? Visit your doctor or health care professional for checks on your progress. You will need to use this medicine for 2 to 6 weeks. This may be longer depending on the condition being treated. You may not see full healing for another 1 to 2 months after you stop using the medicine. Treated areas of skin can look  unsightly during and for several weeks after treatment with this medicine. This medicine can make you more sensitive to the sun. Keep out of the sun. If you cannot avoid being in the sun, wear protective clothing and use sunscreen. Do not use sun lamps or tanning beds/booths. If a pet comes in contact with the area where this medicine was applied to your skin or if it is ingested, they may have a serious risk of side effects. If accidental contact happens, the skin of the pet should be washed right away with soap and water. Contact your vet right away if your pet becomes exposed. Do not become pregnant while taking this medicine. Women should inform their doctor if they wish to become pregnant or think they might be pregnant. There is a potential for serious side effects to an unborn child. Talk to your health care professional or pharmacist for more information. What side effects may I notice from receiving this medicine? Side effects that you should report to your doctor or  health care professional as soon as possible: -allergic reactions like skin rash, itching or hives, swelling of the face, lips, or tongue -black or bloody stools, blood in the urine or vomit -blurred vision -chest pain -difficulty breathing or wheezing -redness, blistering, peeling or loosening of the skin, including inside the mouth -severe redness and swelling of normal skin -slurred speech or weakness on one side of the body -trouble passing urine or change in the amount of urine -unexplained weight gain or swelling -unusually weak or tired -yellowing of eyes or skin Side effects that usually do not require medical attention (report to your doctor or health care professional if they continue or are bothersome): -increased sensitivity of the skin to sun and ultraviolet light -pain and burning of the affected area -scaling or swelling of the affected area -skin rash, itching of the affected area -tenderness This list  may not describe all possible side effects. Call your doctor for medical advice about side effects. You may report side effects to FDA at 1-800-FDA-1088. Where should I keep my medicine? Keep out of the reach of children and pets. Store at room temperature between 20 and 25 degrees C (68 and 77 degrees F). Throw away any unused medicine after the expiration date. NOTE: This sheet is a summary. It may not cover all possible information. If you have questions about this medicine, talk to your doctor, pharmacist, or health care provider.  2018 Elsevier/Gold Standard (2015-05-19 17:35:08)

## 2017-07-22 ENCOUNTER — Other Ambulatory Visit: Payer: Self-pay | Admitting: Physician Assistant

## 2017-07-22 ENCOUNTER — Telehealth: Payer: Self-pay | Admitting: Emergency Medicine

## 2017-07-22 DIAGNOSIS — F32A Depression, unspecified: Secondary | ICD-10-CM

## 2017-07-22 DIAGNOSIS — F419 Anxiety disorder, unspecified: Principal | ICD-10-CM

## 2017-07-22 DIAGNOSIS — F329 Major depressive disorder, single episode, unspecified: Secondary | ICD-10-CM

## 2017-07-22 NOTE — Telephone Encounter (Signed)
TCT patient.  No answer.  Left message asking for a call back.

## 2017-07-22 NOTE — Telephone Encounter (Signed)
Pt returned earlier call for chemo follow up.  Pt reports trouble eating d/t general changes in taste and lack of desire to eat. Pt receives oxaliplatin and cannot have anything cold in the days following her chemo.  Pt also reports fear of eating anything too heavy or solid d/t her hx of a bowel obstruction which adds to her anorexia.  Advised pt on trying room temperature supplement drinks like Ensure which she reports 'taste like metal'.  Also advised pt on eating more calories via soft foods like mashed potatoes, grits, soup, etc.  Pt to see nutritionist on 07/24/17.  Verbalized understanding of recommendations and that she may call us back at any time.  Pt denies any other GI symptoms other than occasional nausea.

## 2017-07-22 NOTE — Telephone Encounter (Signed)
-----   Message from Mathis Fare, RN sent at 07/21/2017 12:30 PM EDT ----- Regarding: Dr. Harlon Ditty Follow Up  1st time Folfox, pt tolerated tx well .

## 2017-07-23 ENCOUNTER — Telehealth: Payer: Self-pay

## 2017-07-23 DIAGNOSIS — Z452 Encounter for adjustment and management of vascular access device: Secondary | ICD-10-CM | POA: Diagnosis not present

## 2017-07-23 DIAGNOSIS — Z7902 Long term (current) use of antithrombotics/antiplatelets: Secondary | ICD-10-CM | POA: Diagnosis not present

## 2017-07-23 DIAGNOSIS — C189 Malignant neoplasm of colon, unspecified: Secondary | ICD-10-CM | POA: Diagnosis not present

## 2017-07-23 DIAGNOSIS — F419 Anxiety disorder, unspecified: Secondary | ICD-10-CM | POA: Diagnosis not present

## 2017-07-23 DIAGNOSIS — Z7982 Long term (current) use of aspirin: Secondary | ICD-10-CM | POA: Diagnosis not present

## 2017-07-23 DIAGNOSIS — R634 Abnormal weight loss: Secondary | ICD-10-CM | POA: Diagnosis not present

## 2017-07-23 NOTE — Telephone Encounter (Signed)
Called to see how she is doing today. She is better today. She did not feel like she needed IV fluids today when the nurse came out. The nurse is going to give her IV fluids tomorrow. Instructed to call the office if needed. Verbalized understanding.

## 2017-07-23 NOTE — Telephone Encounter (Signed)
Ubaldo Glassing, nurse with [facility] called and left message to call her.  Called back. She saw the patient today. Patient did not want IV fluids today. She requested IV fluids tomorrow. She changed PICC dressing. It has not been changed since PICC placed. She will send over orders that includes the Biopatch and Sorbaview dressing for PICC.  She wanted to clarify flushing the PICC line. Do they flush PICC 3 x week? Or how often would you like them to flush?

## 2017-07-23 NOTE — Telephone Encounter (Signed)
Hi Brenda,  Pls call this afternoon for follow-up. Advise [facility] to give IVF if patient cannot drink adequate liquids

## 2017-07-24 ENCOUNTER — Ambulatory Visit: Payer: Medicare Other | Admitting: Nutrition

## 2017-07-24 ENCOUNTER — Other Ambulatory Visit: Payer: Self-pay

## 2017-07-24 DIAGNOSIS — Z7982 Long term (current) use of aspirin: Secondary | ICD-10-CM | POA: Diagnosis not present

## 2017-07-24 DIAGNOSIS — Z7902 Long term (current) use of antithrombotics/antiplatelets: Secondary | ICD-10-CM | POA: Diagnosis not present

## 2017-07-24 DIAGNOSIS — F419 Anxiety disorder, unspecified: Secondary | ICD-10-CM | POA: Diagnosis not present

## 2017-07-24 DIAGNOSIS — Z452 Encounter for adjustment and management of vascular access device: Secondary | ICD-10-CM | POA: Diagnosis not present

## 2017-07-24 DIAGNOSIS — C189 Malignant neoplasm of colon, unspecified: Secondary | ICD-10-CM | POA: Diagnosis not present

## 2017-07-24 DIAGNOSIS — R634 Abnormal weight loss: Secondary | ICD-10-CM | POA: Diagnosis not present

## 2017-07-24 MED ORDER — MAGIC MOUTHWASH W/LIDOCAINE: 5.0000 mL | Freq: Four times a day (QID) | ORAL | 1 refills | Status: DC

## 2017-07-24 NOTE — Telephone Encounter (Signed)
Patient is supposed to flush the PICC daily (routine protocol unless it is a different picc line). Please call IR to confirm the type and manufacturer's recommendation

## 2017-07-24 NOTE — Progress Notes (Signed)
67 year old female diagnosed with metastatic colon cancer.  Past medical history includes tobacco, MI, gallstones, diverticulosis, depression, CAD, and anxiety.  Medications include Prozac, Ativan, Prilosec, Zofran, and Compazine.  Labs were reviewed.  Height: 65 inches. Weight: 121 pounds on April 1. Usual body weight: 165 pounds approximately 1 year ago. BMI: 20.18.  Patient has had progressive weight loss. She was recently hospitalized for a bowel obstruction and was discharged home on full liquid/soft diet. Patient was not given any diet education and says she has been extremely restrictive and eating only Jell-O, mashed potatoes, and pudding. Reports she received her first chemotherapy which included oxaliplatin on Monday.  Nutrition diagnosis:  Unintended weight loss related to metastatic cancer as evidenced by 27% weight loss over 1 year.  Intervention: Patient educated on a low residue diet.  I reviewed recommended foods and foods to avoid with patient. Patient is estatic that there are more foods that she is able to eat. Provided a copy of the fact sheet. Questions were answered.  Teach back method used.  Contact information provided.  Monitoring evaluation goals: Patient will tolerate adequate calories and protein to minimize further weight loss and to avoid bowel obstruction.  There is no follow-up scheduled however patient has my contact information for questions or concerns.  **Disclaimer: This note was dictated with voice recognition software. Similar sounding words can inadvertently be transcribed and this note may contain transcription errors which may not have been corrected upon publication of note.**

## 2017-07-27 ENCOUNTER — Emergency Department (HOSPITAL_COMMUNITY): Payer: Medicare Other

## 2017-07-27 ENCOUNTER — Emergency Department (HOSPITAL_COMMUNITY)
Admission: EM | Admit: 2017-07-27 | Discharge: 2017-07-27 | Disposition: A | Discharge status: Home / Self Care | Payer: Medicare Other | Attending: Emergency Medicine | Admitting: Emergency Medicine

## 2017-07-27 ENCOUNTER — Encounter (HOSPITAL_COMMUNITY): Payer: Self-pay | Admitting: Radiology

## 2017-07-27 DIAGNOSIS — Z7982 Long term (current) use of aspirin: Secondary | ICD-10-CM | POA: Diagnosis not present

## 2017-07-27 DIAGNOSIS — R1110 Vomiting, unspecified: Secondary | ICD-10-CM | POA: Diagnosis not present

## 2017-07-27 DIAGNOSIS — C189 Malignant neoplasm of colon, unspecified: Secondary | ICD-10-CM | POA: Diagnosis not present

## 2017-07-27 DIAGNOSIS — D010 Carcinoma in situ of colon: Secondary | ICD-10-CM | POA: Diagnosis not present

## 2017-07-27 DIAGNOSIS — R112 Nausea with vomiting, unspecified: Secondary | ICD-10-CM | POA: Diagnosis not present

## 2017-07-27 DIAGNOSIS — F17210 Nicotine dependence, cigarettes, uncomplicated: Secondary | ICD-10-CM | POA: Insufficient documentation

## 2017-07-27 DIAGNOSIS — K2970 Gastritis, unspecified, without bleeding: Secondary | ICD-10-CM | POA: Diagnosis not present

## 2017-07-27 DIAGNOSIS — Z79899 Other long term (current) drug therapy: Secondary | ICD-10-CM | POA: Diagnosis not present

## 2017-07-27 DIAGNOSIS — I2510 Atherosclerotic heart disease of native coronary artery without angina pectoris: Secondary | ICD-10-CM | POA: Diagnosis not present

## 2017-07-27 DIAGNOSIS — R109 Unspecified abdominal pain: Secondary | ICD-10-CM | POA: Diagnosis not present

## 2017-07-27 DIAGNOSIS — R188 Other ascites: Secondary | ICD-10-CM | POA: Diagnosis not present

## 2017-07-27 LAB — COMPREHENSIVE METABOLIC PANEL
ALT: 17 U/L (ref 14–54)
AST: 16 U/L (ref 15–41)
Albumin: 3.4 g/dL — ABNORMAL LOW (ref 3.5–5.0)
Alkaline Phosphatase: 89 U/L (ref 38–126)
Anion gap: 12 (ref 5–15)
BUN: 28 mg/dL — AB (ref 6–20)
CHLORIDE: 104 mmol/L (ref 101–111)
CO2: 21 mmol/L — ABNORMAL LOW (ref 22–32)
Calcium: 9.2 mg/dL (ref 8.9–10.3)
Creatinine, Ser: 0.81 mg/dL (ref 0.44–1.00)
Glucose, Bld: 117 mg/dL — ABNORMAL HIGH (ref 65–99)
POTASSIUM: 3.9 mmol/L (ref 3.5–5.1)
SODIUM: 137 mmol/L (ref 135–145)
Total Bilirubin: 0.5 mg/dL (ref 0.3–1.2)
Total Protein: 7.2 g/dL (ref 6.5–8.1)

## 2017-07-27 LAB — URINALYSIS, ROUTINE W REFLEX MICROSCOPIC
Bilirubin Urine: NEGATIVE
GLUCOSE, UA: 50 mg/dL — AB
KETONES UR: 5 mg/dL — AB
LEUKOCYTES UA: NEGATIVE
Nitrite: NEGATIVE
PROTEIN: NEGATIVE mg/dL
Specific Gravity, Urine: 1.031 — ABNORMAL HIGH (ref 1.005–1.030)
pH: 5 (ref 5.0–8.0)

## 2017-07-27 LAB — CBC
HEMATOCRIT: 44.4 % (ref 36.0–46.0)
Hemoglobin: 15.3 g/dL — ABNORMAL HIGH (ref 12.0–15.0)
MCH: 33.2 pg (ref 26.0–34.0)
MCHC: 34.5 g/dL (ref 30.0–36.0)
MCV: 96.3 fL (ref 78.0–100.0)
Platelets: 262 10*3/uL (ref 150–400)
RBC: 4.61 MIL/uL (ref 3.87–5.11)
RDW: 12.6 % (ref 11.5–15.5)
WBC: 11.3 10*3/uL — AB (ref 4.0–10.5)

## 2017-07-27 LAB — LIPASE, BLOOD: LIPASE: 36 U/L (ref 11–51)

## 2017-07-27 MED ORDER — IOPAMIDOL (ISOVUE-300) INJECTION 61%
INTRAVENOUS | Status: AC
  Administered 2017-07-27: 100 mL
  Filled 2017-07-27: qty 100

## 2017-07-27 MED ORDER — SODIUM CHLORIDE 0.9 % IJ SOLN
INTRAMUSCULAR | Status: AC
  Filled 2017-07-27: qty 50

## 2017-07-27 MED ORDER — SODIUM CHLORIDE 0.9 % IV BOLUS
1000.0000 mL | Freq: Once | INTRAVENOUS | Status: AC
Start: 2017-07-27 — End: 2017-07-27
  Administered 2017-07-27: 1000 mL via INTRAVENOUS

## 2017-07-27 MED ORDER — SODIUM CHLORIDE 0.9 % IV SOLN
8.0000 mg | Freq: Once | INTRAVENOUS | Status: AC
  Administered 2017-07-27: 8 mg via INTRAVENOUS
  Filled 2017-07-27: qty 4

## 2017-07-27 MED ORDER — SODIUM CHLORIDE 0.9 % IV BOLUS
1000.0000 mL | Freq: Once | INTRAVENOUS | Status: AC
  Administered 2017-07-27: 1000 mL via INTRAVENOUS

## 2017-07-27 MED ORDER — GLYCERIN (ADULT) 2 G RE SUPP: 1.0000 | RECTAL | 0 refills | Status: DC | PRN

## 2017-07-27 MED ORDER — PROMETHAZINE HCL 25 MG/ML IJ SOLN
12.5000 mg | Freq: Once | INTRAMUSCULAR | Status: AC
  Administered 2017-07-27: 12.5 mg via INTRAVENOUS
  Filled 2017-07-27: qty 1

## 2017-07-27 MED ORDER — SODIUM CHLORIDE 0.9 % IV SOLN
Freq: Once | INTRAVENOUS | Status: AC
  Administered 2017-07-27: 18:00:00 via INTRAVENOUS

## 2017-07-27 MED ORDER — GLYCERIN (LAXATIVE) 2.1 G RE SUPP
1.0000 | Freq: Once | RECTAL | Status: AC
  Administered 2017-07-27: 1 via RECTAL
  Filled 2017-07-27: qty 1

## 2017-07-27 MED ORDER — ONDANSETRON HCL 4 MG/2ML IJ SOLN
INTRAMUSCULAR | Status: AC
  Administered 2017-07-27: 4 mg
  Filled 2017-07-27: qty 2

## 2017-07-27 NOTE — ED Provider Notes (Signed)
[facility] DEPT Provider Note   CSN: [id number] Arrival date & time: 07/27/17  1612     History   Chief Complaint Chief Complaint  Patient presents with  . Abdominal Pain  . Cancer Pt    HPI Carol Ferguson is a 67 y.o. female.  Chief complaint is vomiting after chemotherapy.  HPI: 67 year old female.  Recent history of abdominal pain with multiple diagnostic tests performed.  Had ovarian mass.  Had ascites and carcinomatosis.  Thought to be ovarian.  Biopsy proved to be colorectal carcinoma.  Not a candidate for surgical intervention.  Started 5-FU and leucovorin 6 days ago.  Had nausea and vomiting for the last hours.  She had been taking fairly regular antiemetics since last Monday when she started chemotherapy.  Felt pretty good" this morning did not take any.  Started vomiting this evening and presents here not able to stop after Zofran at home.  Did speak with dietitian this week and is on a low residue diet.  Is not had a bowel movement in the last 3-4 days.  States she does not feel like he did in the past when she was admitted a few weeks ago with SBO.  Past Medical History:  Diagnosis Date  . Anxiety   . 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 CAD (coronary artery disease)    a. Inf STEMI/LHC 09/2016 which showed occluded mid LCx treated with PCI, DES; remaining cath details included a proximal to mid LAD lesion of 20% stenosis, and proximal to mid RCA 25% stenosis. EF 55-60%.  . Depression   . Diverticulosis   . Gallstones   . Ischemic cardiomyopathy    a. 2D Echo 09/28/16 showed EF 55-60%, probable severe hypokinesis of the entire inferiormyocardium, normal diastolic parameters.  . Myocardial infarction ([place]) 09/26/2016  . Pneumonia   . Presence of tooth-root and mandibular implants   . Tobacco abuse   . Wears glasses     Patient Active Problem List   Diagnosis Date Noted  . Anxiety 07/16/2017  . Chronic nausea 07/16/2017  .  Weight loss 07/16/2017  . Colon cancer ([place]) 07/15/2017  . Goals of care, counseling/discussion 07/15/2017  . SBO (small bowel obstruction) ([place]) 07/12/2017  . Left tubo-ovarian mass 07/02/2017  . Peritoneal carcinomatosis ([place]) 07/02/2017  . Diverticulosis 01/10/2017  . Cervical mass 12/27/2016  . CAD (coronary artery disease), native coronary artery 11/27/2016  . Chronic diastolic heart failure ([place]) 09/27/2016  . Hyperlipidemia with target LDL less than 70 09/27/2016  . 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 Lumbar degenerative disc disease 06/14/2016    Past Surgical History:  Procedure Laterality Date  . CERVICAL CONIZATION W/BX N/A 01/28/2017   Procedure: CONIZATION CERVIX WITH BIOPSY;  Surgeon: [name], MD;  Location: [facility];  Service: Gynecology;  Laterality: N/A;  . CHOLECYSTECTOMY    . CORONARY STENT INTERVENTION N/A 09/26/2016   Procedure: Coronary Stent Intervention;  Surgeon: Belva Crome, MD;  Location: [facility];  Service: Cardiovascular;  Laterality: N/A;  . DILATION AND CURETTAGE OF UTERUS N/A 01/28/2017   Procedure: DILATATION AND CURETTAGE WITH ULTRASOUND GUIDENCE;  Surgeon: [name], MD;  Location: [facility];  Service: Gynecology;  Laterality: N/A;  . LEFT HEART CATH AND CORONARY ANGIOGRAPHY N/A 09/26/2016   Procedure: Left Heart Cath and Coronary Angiography;  Surgeon: Belva Crome, MD;  Location: [facility];  Service:  Cardiovascular;  Laterality: N/A;     OB History    Gravida  0   Para  0   Term  0   Preterm  0   AB  0   Living  0     SAB  0   TAB  0   Ectopic  0   Multiple  0   Live Births  0            Home Medications    Prior to Admission medications   Medication Sig Start Date End Date Taking? Authorizing Provider  aspirin 81 MG chewable tablet Chew 1 tablet (81 mg total) by mouth daily. 09/28/16  Yes Dunn, Areta Haber, PA-C  atorvastatin (LIPITOR) 10  MG tablet Take 1 tablet (10 mg total) by mouth daily. 12/02/16 07/12/24 Yes Belva Crome, MD  BRILINTA 90 MG TABS tablet TAKE 1 TABLET BY MOUTH TWICE A DAY 10/25/16  Yes Dunn, Dayna N, PA-C  FLUoxetine (PROZAC) 10 MG capsule TAKE 1 CAPSULE BY MOUTH EVERY DAY Patient taking differently: TAKE 1 CAPSULE BY MOUTH EVERY other DAY 07/22/17  Yes Timmothy Euler, [place] D, PA-C  LORazepam (ATIVAN) 0.5 MG tablet Take 1 tablet (0.5 mg total) by mouth every 8 (eight) hours as needed for anxiety. 07/16/17  Yes Heath Lark, MD  magic mouthwash w/lidocaine SOLN Take 5 mLs by mouth 4 (four) times daily. Swish and spit. 07/24/17  Yes Gorsuch, Ni, MD  nitroGLYCERIN (NITROSTAT) 0.4 MG SL tablet Place 1 tablet (0.4 mg total) under the tongue every 5 (five) minutes x 3 doses as needed for chest pain. 09/28/16  Yes Dunn, Ryan M, PA-C  ondansetron (ZOFRAN) 8 MG tablet Take 1 tablet (8 mg total) by mouth every 8 (eight) hours as needed for refractory nausea / vomiting. Start on day 3 after chemotherapy. 07/16/17  Yes Heath Lark, MD  prochlorperazine (COMPAZINE) 10 MG tablet Take 1 tablet (10 mg total) by mouth every 6 (six) hours as needed (Nausea or vomiting). 07/16/17  Yes Gorsuch, Ni, MD  dicyclomine (BENTYL) 10 MG capsule TAKE 1 CAPSULE BY MOUTH EVERY 8 HOURS AS NEEDED FOR SPASMS. FOR ABDOMINAL DISCOMFORT Patient not taking: Reported on 07/27/2017 06/20/17   Mauri Pole, MD  glycerin adult 2 g suppository Place 1 suppository rectally as needed for constipation. 07/27/17   Tanna Furry, MD  ondansetron (ZOFRAN) 4 MG tablet Take 1 tablet (4 mg total) by mouth every 8 (eight) hours as needed for nausea or vomiting. Patient not taking: Reported on 07/27/2017 04/18/17   Mauri Pole, MD  traZODone (DESYREL) 50 MG tablet Take 0.5-1 tablets (25-50 mg total) at bedtime as needed by mouth for sleep. Patient not taking: Reported on 07/27/2017 03/06/17   [name], PA-C    Family History Family History  Problem Relation Age of  Onset  . Cancer Brother        MELANOMA  . Heart disease Father   . Stroke Father   . Heart disease Brother   . Diabetes Brother   . Heart attack Brother   . Heart failure Brother     Social History Social History   Tobacco Use  . Smoking status: Former Smoker    Packs/day: 1.00    Years: 35.00    Pack years: 35.00    Types: Cigarettes    Last attempt to quit: 09/26/2016    Years since quitting: 0.8  . Smokeless tobacco: Never Used  Substance Use Topics  . Alcohol use:  Yes    Comment: occasional  . Drug use: No     Allergies   Flagyl [metronidazole]   Review of Systems Review of Systems  Constitutional: Negative for appetite change, chills, diaphoresis, fatigue and fever.  HENT: Negative for mouth sores, sore throat and trouble swallowing.   Eyes: Negative for visual disturbance.  Respiratory: Negative for cough, chest tightness, shortness of breath and wheezing.   Cardiovascular: Negative for chest pain.  Gastrointestinal: Positive for abdominal pain and nausea. Negative for abdominal distention, diarrhea and vomiting.  Endocrine: Negative for polydipsia, polyphagia and polyuria.  Genitourinary: Negative for dysuria, frequency and hematuria.  Musculoskeletal: Negative for gait problem.  Skin: Negative for color change, pallor and rash.  Neurological: Negative for dizziness, syncope, light-headedness and headaches.  Hematological: Does not bruise/bleed easily.  Psychiatric/Behavioral: Negative for behavioral problems and confusion.     Physical Exam Updated Vital Signs BP 110/62 (BP Location: Left Arm)   Pulse 69   Resp 17   SpO2 100%   Physical Exam  Constitutional: She is oriented to person, place, and time. She appears well-developed and well-nourished. No distress.  HENT:  Head: Normocephalic.  Eyes: Pupils are equal, round, and reactive to light. Conjunctivae are normal. No scleral icterus.  Neck: Normal range of motion. Neck supple. No thyromegaly  present.  Cardiovascular: Normal rate and regular rhythm. Exam reveals no gallop and no friction rub.  No murmur heard. Pulmonary/Chest: Effort normal and breath sounds normal. No respiratory distress. She has no wheezes. She has no rales.  Abdominal: Soft. Bowel sounds are normal. She exhibits no distension. There is no tenderness. There is no rebound.  Abdomen soft, nondistended.  Positive bowel sounds.  Musculoskeletal: Normal range of motion.  Neurological: She is alert and oriented to person, place, and time.  Skin: Skin is warm and dry. No rash noted.  Psychiatric: She has a normal mood and affect. Her behavior is normal.     ED Treatments / Results  Labs (all labs ordered are listed, but only abnormal results are displayed) Labs Reviewed  COMPREHENSIVE METABOLIC PANEL - Abnormal; Notable for the following components:      Result Value   CO2 21 (*)    Glucose, Bld 117 (*)    BUN 28 (*)    Albumin 3.4 (*)    All other components within normal limits  CBC - Abnormal; Notable for the following components:   WBC 11.3 (*)    Hemoglobin 15.3 (*)    All other components within normal limits  URINALYSIS, ROUTINE W REFLEX MICROSCOPIC - Abnormal; Notable for the following components:   Color, Urine AMBER (*)    APPearance HAZY (*)    Specific Gravity, Urine 1.031 (*)    Glucose, UA 50 (*)    Hgb urine dipstick MODERATE (*)    Ketones, ur 5 (*)    Bacteria, UA RARE (*)    Squamous Epithelial / LPF 6-30 (*)    Crystals PRESENT (*)    All other components within normal limits  LIPASE, BLOOD    EKG None  Radiology Ct Abdomen Pelvis W Contrast  Result Date: 07/27/2017 CLINICAL DATA:  Recently diagnosed with gastric cancer, unable to have surgery. Patient started her first chemotherapy treatment. Patient states vomiting bile earlier today. EXAM: CT ABDOMEN AND PELVIS WITH CONTRAST TECHNIQUE: Multidetector CT imaging of the abdomen and pelvis was performed using the standard  protocol following bolus administration of intravenous contrast. CONTRAST:  117mL ISOVUE-300 IOPAMIDOL (ISOVUE-300) INJECTION 61%  COMPARISON:  July 12, 2017 FINDINGS: Lower chest: No acute abnormality. Hepatobiliary: The liver is normal without focal liver lesion. Patient status post prior cholecystectomy. Mild postsurgical intrahepatic biliary ductal dilatation is identified. The common bile duct is normal. Pancreas: Unremarkable. No pancreatic ductal dilatation or surrounding inflammatory changes. Spleen: Normal in size without focal abnormality. Adrenals/Urinary Tract: Hypertrophy of left adrenal gland is unchanged. The right adrenal gland is normal. Stable left kidney cyst in the upper pole is noted. The kidneys are otherwise unremarkable. There is no hydronephrosis bilaterally. The bladder is normal. Stomach/Bowel: There is wall thickening of the posterior fundal stomach. There is no obstruction of the stomach. There is no small bowel obstruction. No colonic obstruction is identified. The appendix is normal. The sigmoid colon is decompressed thickened bowel wall. Vascular/Lymphatic: Aortic atherosclerosis. No enlarged abdominal or pelvic lymph nodes. Reproductive: Mixed cystic and solid mass in the left ovary is unchanged. The uterus and right ovary are normal. Other: There is interval resolution of previously noted ascites. Omental nodularities of the left lower quadrant and right abdomen are unchanged. Musculoskeletal: No acute or significant osseous findings. Degenerative joint changes of the spine are noted. IMPRESSION: The previously noted ascites in the abdomen and pelvis has resolved. 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 No small bowel or colonic obstruction is identified. Generalized thickened bowel wall of the sigmoid colon. Mixed cystic and solid mass of the left ovary is unchanged. Status post prior cholecystectomy with mild postsurgical intrahepatic biliary ductal  dilatation. Electronically Signed   By: Abelardo Diesel M.D.   On: 07/27/2017 20:29   Dg Abd Acute W/chest  Result Date: 07/27/2017 CLINICAL DATA:  Colorectal cancer.  Nausea and vomiting. EXAM: DG ABDOMEN ACUTE W/ 1V CHEST COMPARISON:  07/14/2017 FINDINGS: The lungs are clear without focal pneumonia, edema, pneumothorax or pleural effusion. The cardiopericardial silhouette is within normal limits for size. Nodular opacity overlying the right lower lung is compatible with a nipple shadow as confirmed on upright abdomen film. Right PICC line tip overlies the mid to distal SVC. Upright film shows no evidence for intraperitoneal free air. Supine film shows no gaseous small bowel dilatation to suggest obstruction. Moderate stool volume seen along the length of the colon. Visualized bony anatomy unremarkable. IMPRESSION: 1. No acute cardiopulmonary findings. 2. No evidence for small bowel obstruction. No intraperitoneal free air. 3. Moderate stool volume. Electronically Signed   By: Misty Stanley M.D.   On: 07/27/2017 18:09    Procedures Procedures (including critical care time)  Medications Ordered in ED Medications  sodium chloride 0.9 % injection (has no administration in time range)  Glycerin (Adult) 2.1 g suppository 1 suppository (has no administration in time range)  ondansetron (ZOFRAN) 8 mg in sodium chloride 0.9 % 50 mL IVPB (0 mg Intravenous Stopped 07/27/17 1854)  0.9 %  sodium chloride infusion ( Intravenous New Bag/Given 07/27/17 1735)  sodium chloride 0.9 % bolus 1,000 mL (0 mLs Intravenous Stopped 07/27/17 1854)  ondansetron (ZOFRAN) 4 MG/2ML injection (4 mg  Given 07/27/17 1734)  sodium chloride 0.9 % bolus 1,000 mL (1,000 mLs Intravenous New Bag/Given 07/27/17 2004)  iopamidol (ISOVUE-300) 61 % injection (100 mLs  Contrast Given 07/27/17 1944)  promethazine (PHENERGAN) injection 12.5 mg (12.5 mg Intravenous Given 07/27/17 2005)     Initial Impression / Assessment and Plan / ED Course  I have  reviewed the triage vital signs and the nursing notes.  Pertinent labs & imaging results that were available during my care  of the patient were reviewed by me and considered in my medical decision making (see chart for details).    CT scan does not show obstruction.  This showed unchanged tumor bulk.  After antiemetics and fluids she is able to take p.o.  She has a strong desire to be at home.  We will continue Compazine.  PRN Zofran.  Call her physician regarding follow-up.  Final Clinical Impressions(s) / ED Diagnoses   Final diagnoses:  Non-intractable vomiting, presence of nausea not specified, unspecified vomiting type    ED Discharge Orders        Ordered    glycerin adult 2 g suppository  As needed     07/27/17 2115       Tanna Furry, MD 07/27/17 2121

## 2017-07-27 NOTE — ED Triage Notes (Signed)
Pt bib EMS. Pt is a cancer pt.  Pt reports she has not had a BM in 4 days. PT has a hx of bowel obstruction.  Pt denies painful urination, chest pain, and SOB. Pt denies fever.  Pt has a PICC line

## 2017-07-27 NOTE — Discharge Instructions (Signed)
Discuss glycerin suppository use with [name]  Take Compazine twice daily.  Use Zofran as needed for refractory nausea.

## 2017-07-27 NOTE — ED Notes (Signed)
Patient transported to CT 

## 2017-07-28 ENCOUNTER — Telehealth: Payer: Self-pay | Admitting: *Deleted

## 2017-07-28 DIAGNOSIS — Z7902 Long term (current) use of antithrombotics/antiplatelets: Secondary | ICD-10-CM | POA: Diagnosis not present

## 2017-07-28 DIAGNOSIS — F419 Anxiety disorder, unspecified: Secondary | ICD-10-CM | POA: Diagnosis not present

## 2017-07-28 DIAGNOSIS — C189 Malignant neoplasm of colon, unspecified: Secondary | ICD-10-CM | POA: Diagnosis not present

## 2017-07-28 DIAGNOSIS — R634 Abnormal weight loss: Secondary | ICD-10-CM | POA: Diagnosis not present

## 2017-07-28 DIAGNOSIS — Z7982 Long term (current) use of aspirin: Secondary | ICD-10-CM | POA: Diagnosis not present

## 2017-07-28 DIAGNOSIS — Z452 Encounter for adjustment and management of vascular access device: Secondary | ICD-10-CM | POA: Diagnosis not present

## 2017-07-28 NOTE — Telephone Encounter (Signed)
-----   Message from Heath Lark, MD sent at 07/28/2017  8:02 AM EDT ----- Regarding: call her Can you call her and ask how she is doing? Went to ER over the weekend

## 2017-07-28 NOTE — Telephone Encounter (Signed)
Suppository if generally not recommended due to risk of infection Her blood count in the ER was OK so it was ok to use suppository then

## 2017-07-28 NOTE — Telephone Encounter (Signed)
States she had an Xray and CT to rule out blockage. Was a "little stopped up". MD felt nausea was chemo related. Had been using antiemetics regularly, but did not take any Sunday morning.   ED gave her a prescription for glycerin suppository. Gave her one in the ED, has had BM this morning and feels much better. He wanted her to double check with Dr Alvy Bimler to see if she is OK with her using glycerin suppository moving forward. Miralax makes nauseated and crampy.

## 2017-07-30 DIAGNOSIS — R634 Abnormal weight loss: Secondary | ICD-10-CM | POA: Diagnosis not present

## 2017-07-30 DIAGNOSIS — Z7902 Long term (current) use of antithrombotics/antiplatelets: Secondary | ICD-10-CM | POA: Diagnosis not present

## 2017-07-30 DIAGNOSIS — Z452 Encounter for adjustment and management of vascular access device: Secondary | ICD-10-CM | POA: Diagnosis not present

## 2017-07-30 DIAGNOSIS — F419 Anxiety disorder, unspecified: Secondary | ICD-10-CM | POA: Diagnosis not present

## 2017-07-30 DIAGNOSIS — Z7982 Long term (current) use of aspirin: Secondary | ICD-10-CM | POA: Diagnosis not present

## 2017-07-30 DIAGNOSIS — C189 Malignant neoplasm of colon, unspecified: Secondary | ICD-10-CM | POA: Diagnosis not present

## 2017-08-04 ENCOUNTER — Inpatient Hospital Stay (HOSPITAL_BASED_OUTPATIENT_CLINIC_OR_DEPARTMENT_OTHER): Payer: Medicare Other | Admitting: Hematology and Oncology

## 2017-08-04 ENCOUNTER — Encounter: Payer: Self-pay | Admitting: Hematology and Oncology

## 2017-08-04 ENCOUNTER — Inpatient Hospital Stay: Payer: Medicare Other

## 2017-08-04 DIAGNOSIS — F419 Anxiety disorder, unspecified: Secondary | ICD-10-CM

## 2017-08-04 DIAGNOSIS — G629 Polyneuropathy, unspecified: Secondary | ICD-10-CM

## 2017-08-04 DIAGNOSIS — Z79899 Other long term (current) drug therapy: Secondary | ICD-10-CM | POA: Diagnosis not present

## 2017-08-04 DIAGNOSIS — C189 Malignant neoplasm of colon, unspecified: Secondary | ICD-10-CM

## 2017-08-04 DIAGNOSIS — R971 Elevated cancer antigen 125 [CA 125]: Secondary | ICD-10-CM | POA: Diagnosis not present

## 2017-08-04 DIAGNOSIS — C786 Secondary malignant neoplasm of retroperitoneum and peritoneum: Secondary | ICD-10-CM

## 2017-08-04 DIAGNOSIS — K5909 Other constipation: Secondary | ICD-10-CM

## 2017-08-04 DIAGNOSIS — Z87891 Personal history of nicotine dependence: Secondary | ICD-10-CM | POA: Diagnosis not present

## 2017-08-04 DIAGNOSIS — R634 Abnormal weight loss: Secondary | ICD-10-CM

## 2017-08-04 DIAGNOSIS — G620 Drug-induced polyneuropathy: Secondary | ICD-10-CM

## 2017-08-04 DIAGNOSIS — E441 Mild protein-calorie malnutrition: Secondary | ICD-10-CM

## 2017-08-04 DIAGNOSIS — T451X5A Adverse effect of antineoplastic and immunosuppressive drugs, initial encounter: Secondary | ICD-10-CM

## 2017-08-04 DIAGNOSIS — C801 Malignant (primary) neoplasm, unspecified: Secondary | ICD-10-CM | POA: Diagnosis not present

## 2017-08-04 DIAGNOSIS — D390 Neoplasm of uncertain behavior of uterus: Secondary | ICD-10-CM | POA: Diagnosis not present

## 2017-08-04 DIAGNOSIS — R110 Nausea: Secondary | ICD-10-CM

## 2017-08-04 DIAGNOSIS — C19 Malignant neoplasm of rectosigmoid junction: Secondary | ICD-10-CM | POA: Diagnosis not present

## 2017-08-04 LAB — CMP (CANCER CENTER ONLY)
ALBUMIN: 3.1 g/dL — AB (ref 3.5–5.0)
ALT: 15 U/L (ref 0–55)
ANION GAP: 8 (ref 3–11)
AST: 17 U/L (ref 5–34)
Alkaline Phosphatase: 103 U/L (ref 40–150)
BILIRUBIN TOTAL: 0.3 mg/dL (ref 0.2–1.2)
BUN: 21 mg/dL (ref 7–26)
CHLORIDE: 106 mmol/L (ref 98–109)
CO2: 24 mmol/L (ref 22–29)
Calcium: 9.3 mg/dL (ref 8.4–10.4)
Creatinine: 0.82 mg/dL (ref 0.60–1.10)
GFR, Est AFR Am: >60 mL/min (ref >60)
GLUCOSE: 95 mg/dL (ref 70–140)
POTASSIUM: 4.1 mmol/L (ref 3.5–5.1)
Sodium: 138 mmol/L (ref 136–145)
TOTAL PROTEIN: 6.8 g/dL (ref 6.4–8.3)

## 2017-08-04 LAB — CBC WITH DIFFERENTIAL (CANCER CENTER ONLY)
BASOS ABS: 0 10*3/uL (ref 0.0–0.1)
Basophils Relative: 0 %
Eosinophils Absolute: 0.2 10*3/uL (ref 0.0–0.5)
Eosinophils Relative: 3 %
HEMATOCRIT: 39.5 % (ref 34.8–46.6)
HEMOGLOBIN: 13.5 g/dL (ref 11.6–15.9)
LYMPHS ABS: 1.7 10*3/uL (ref 0.9–3.3)
LYMPHS PCT: 31 %
MCH: 33 pg (ref 25.1–34.0)
MCHC: 34.2 g/dL (ref 31.5–36.0)
MCV: 96.6 fL (ref 79.5–101.0)
Monocytes Absolute: 0.7 10*3/uL (ref 0.1–0.9)
Monocytes Relative: 13 %
NEUTROS ABS: 2.8 10*3/uL (ref 1.5–6.5)
Neutrophils Relative %: 53 %
Platelet Count: 196 10*3/uL (ref 145–400)
RBC: 4.09 MIL/uL (ref 3.70–5.45)
RDW: 12.9 % (ref 11.2–14.5)
WBC: 5.4 10*3/uL (ref 3.9–10.3)

## 2017-08-04 MED ORDER — LORAZEPAM 0.5 MG PO TABS: 0.5000 mg | ORAL_TABLET | Freq: Three times a day (TID) | ORAL | 0 refills | Status: DC | PRN

## 2017-08-04 MED ORDER — DEXTROSE 5 % IV SOLN
Freq: Once | INTRAVENOUS | Status: AC
  Administered 2017-08-04: 14:00:00 via INTRAVENOUS

## 2017-08-04 MED ORDER — PALONOSETRON HCL INJECTION 0.25 MG/5ML
INTRAVENOUS | Status: AC
  Filled 2017-08-04: qty 5

## 2017-08-04 MED ORDER — LORAZEPAM 2 MG/ML IJ SOLN
INTRAMUSCULAR | Status: AC
  Filled 2017-08-04: qty 1

## 2017-08-04 MED ORDER — SODIUM CHLORIDE 0.9% FLUSH
10.0000 mL | Freq: Once | INTRAVENOUS | Status: AC
  Administered 2017-08-04: 10 mL
  Filled 2017-08-04: qty 10

## 2017-08-04 MED ORDER — DEXAMETHASONE SODIUM PHOSPHATE 10 MG/ML IJ SOLN
INTRAMUSCULAR | Status: AC
  Filled 2017-08-04: qty 1

## 2017-08-04 MED ORDER — DEXAMETHASONE SODIUM PHOSPHATE 10 MG/ML IJ SOLN
10.0000 mg | Freq: Once | INTRAMUSCULAR | Status: AC
  Administered 2017-08-04: 10 mg via INTRAVENOUS

## 2017-08-04 MED ORDER — LORAZEPAM 2 MG/ML IJ SOLN
0.5000 mg | Freq: Once | INTRAMUSCULAR | Status: AC
  Administered 2017-08-04: 0.5 mg via INTRAVENOUS

## 2017-08-04 MED ORDER — SODIUM CHLORIDE 0.9 % IV SOLN
2400.0000 mg/m2 | INTRAVENOUS | Status: DC
  Administered 2017-08-04: 3800 mg via INTRAVENOUS
  Filled 2017-08-04: qty 76

## 2017-08-04 MED ORDER — PALONOSETRON HCL INJECTION 0.25 MG/5ML
0.2500 mg | Freq: Once | INTRAVENOUS | Status: AC
  Administered 2017-08-04: 0.25 mg via INTRAVENOUS

## 2017-08-04 MED ORDER — OXALIPLATIN CHEMO INJECTION 100 MG/20ML
85.0000 mg/m2 | Freq: Once | INTRAVENOUS | Status: AC
  Administered 2017-08-04: 135 mg via INTRAVENOUS
  Filled 2017-08-04: qty 20

## 2017-08-04 MED ORDER — LEUCOVORIN CALCIUM INJECTION 350 MG
400.0000 mg/m2 | Freq: Once | INTRAMUSCULAR | Status: AC
  Administered 2017-08-04: 636 mg via INTRAVENOUS
  Filled 2017-08-04: qty 31.8

## 2017-08-04 NOTE — Assessment & Plan Note (Signed)
she has mild peripheral neuropathy, likely related to side effects of treatment. It is only mild, not bothering the patient. I will observe for now If it gets worse in the future, I will consider modifying the dose of the treatment  

## 2017-08-04 NOTE — Patient Instructions (Signed)
[facility] Discharge Instructions for Patients Receiving Chemotherapy  Today you received the following chemotherapy agents: Oxaliplatin, Leucovorin, and 5FU.  To help prevent nausea and vomiting after your treatment, we encourage you to take your nausea medication as directed.   If you develop nausea and vomiting that is not controlled by your nausea medication, call the clinic.   BELOW ARE SYMPTOMS THAT SHOULD BE REPORTED IMMEDIATELY:  *FEVER GREATER THAN 100.5 F  *CHILLS WITH OR WITHOUT FEVER  NAUSEA AND VOMITING THAT IS NOT CONTROLLED WITH YOUR NAUSEA MEDICATION  *UNUSUAL SHORTNESS OF BREATH  *UNUSUAL BRUISING OR BLEEDING  TENDERNESS IN MOUTH AND THROAT WITH OR WITHOUT PRESENCE OF ULCERS  *URINARY PROBLEMS  *BOWEL PROBLEMS  UNUSUAL RASH Items with * indicate a potential emergency and should be followed up as soon as possible.  Feel free to call the clinic should you have any questions or concerns. The clinic phone number is [phone].  Please show the CHEMO ALERT CARD at check-in to the Emergency Department and triage nurse.    

## 2017-08-04 NOTE — Assessment & Plan Note (Signed)
I have reviewed CT imaging with the patient and family She has positive response to treatment with resolution of ascites and bowel obstruction We will continue treatment without dose adjustment

## 2017-08-04 NOTE — Assessment & Plan Note (Signed)
I recommend she continue antiemetics as needed

## 2017-08-04 NOTE — Progress Notes (Signed)
[facility] OFFICE PROGRESS NOTE  Patient Care Team: Donzetta Kohut as PCP - General (Physician Assistant) [name], MD as Consulting Physician (Obstetrics and Gynecology) Mauri Pole, MD as Consulting Physician (Gastroenterology) Belva Crome, MD as Consulting Physician (Cardiology)  ASSESSMENT & PLAN:  Colon cancer [facility]) I have reviewed CT imaging with the patient and family She has positive response to treatment with resolution of ascites and bowel obstruction We will continue treatment without dose adjustment  Peritoneal carcinomatosis ([place]) Overall, her symptoms are resolving I plan to repeat imaging study in 3 months. We will continue treatment as scheduled  Chronic nausea I recommend she continue antiemetics as needed  Other constipation She had recent severe constipation With resolution of ascites and positive response to treatment, I recommend gentle laxatives as tolerated  Peripheral neuropathy due to chemotherapy [facility]) she has mild peripheral neuropathy, likely related to side effects of treatment. It is only mild, not bothering the patient. I will observe for now If it gets worse in the future, I will consider modifying the dose of the treatment   Mild protein-calorie malnutrition ([place]) She denies recent weight loss She will continue low residual diet If her symptoms continue to improve, I plan to  advance her diet as tolerated the next time I see her   No orders of the defined types were placed in this encounter.   INTERVAL HISTORY: Please see below for problem oriented charting. She returns with her niece for further follow-up and chemotherapy She went to the emergency department a week ago for symptoms of abdominal pain and nausea She was profoundly constipated at the time CT scan show positive response to treatment She had mild intermittent neuropathy since treatment, resolved She continues to have mild  intermittent nausea She has no further weight loss Her energy level is fair She denies recent infection  SUMMARY OF ONCOLOGIC HISTORY: Oncology History   MSI stable     Colon cancer ([place])   12/17/2016 Imaging    US pelvis 1. There appears to be a 2.2 x 1.9 x 2.0 cm mass centered in the cervix, likely extending into the lower uterus. Recommend correlation with physical exam. An MRI could better evaluate. 2. Thickened endometrium measuring 12 mm. Recommend gynecologic consultation and endometrial sampling given patient age. 3. Probable fibroid in the uterus.      12/27/2016 Pathology Results    Endocervix, curettage - BENIGN ENDOCERVICAL MUCOSA AND BENIGN SQUAMOUS MUCOSA      01/01/2017 Imaging    MRI pelvis 1. No classic intermediate to high signal intensity tissue disrupting the hypointense fibrous cervical stroma as typically seen in the setting of cervical carcinoma. However, there is asymmetric thickening of the posterior cervix with anterior displacement of the cervical canal and associated widening and heterogeneity of the endometrial cavity. As such, imaging features are indeterminate by MRI and close follow-up or tissue sampling is recommended. 2. Apparent areas of tethering/scarring between the sigmoid colon posterior uterus/cervix. Diverticuli are noted in the sigmoid colon and this may be related to scarring from prior bouts is diverticulitis. 3. Small volume intraperitoneal free fluid.      01/28/2017 Pathology Results    1. Cervix, biopsy, posterior - SPINDLE CELL LESION CONSISTENT WITH SMOOTH MUSCLE NEOPLASM, SEE COMMENT. 2. Endometrium, curettage - BENIGN SQUAMOUS MUCOSA. - BENIGN SMOOTH MUSCLE. - NO ENDOMETRIUM PRESENT. Microscopic Comment 1. There is a spindle cell lesion with focal atypia. There is no necrosis or increase in mitotic figures. The  sample is somewhat limited, but the differential includes a cellular leiomyoma.      01/28/2017 Surgery    Surgery:  ultrasound guided dilation of cervix with D&C and cervical biopsy  Surgeons:  Donaciano Eva, MD  Operative findings: 6cm uterus, dilated endometrial cavity, very stenotic cervical os. Thickened, fibrotic cervix without discrete mass, grossly normal cervical mucosa.         03/22/2017 Imaging    CT abdomen and pelvis 1. Diffuse prominent fluid-filled small bowel with mesenteric edema, enteritis pattern. This may be infectious or inflammatory. There is no evidence of obstruction. 2. Mild omental stranding in the left abdomen likely reactive secondary to primary bowel process, omental caking is not entirely excluded. 3. Sigmoid colonic diverticulosis without diverticulitis. 4. Prominent heterogeneous endometrium, recommend correlation with recent D and C. 5.  Aortic Atherosclerosis (ICD10-I70.0).      06/26/2017 Imaging    US pelvis T/V images. Anteverted and heterogenous uterus measuring 5.85 x 3.87 x 3.36 cm. Right subserous fibroid with calcifications measuring 2.7 x 2.6 cm. Endometrial line appears thickened at 8.1 mm. Cervix appears normal, no mass seen. Right ovary not seen. Left adnexal mass cystic/solid measuring 7.6 x 6.0 x 7.1 cm. Positive color flow Doppler.No free fluid in the posterior cul-de-sac.      06/26/2017 Tumor Marker    Patient's tumor was tested for the following markers: CA-125 Results of the tumor marker test revealed 90      06/26/2017 Tumor Marker    Patient's tumor was tested for the following markers: CEA Results of the tumor marker test revealed 4.5      06/27/2017 Imaging    Ct abdomen and pelvis New 7.4 cm left adnexal mass with worsening abdominal ascites and omental nodularity, suggesting metastatic ovarian carcinoma.      07/02/2017 Pathology Results    Cervix, biopsy, ectocervix - SQUAMOUS MUCOSA WITH ATYPICAL INFILTRATE WITHIN FIBROMUSCULAR STROMA. - SEE MICROSCOPIC DESCRIPTION. Microscopic Comment There is a small fragment of  squamous mucosa with a small portion of benign squamous epithelium. Within the fibromuscular stroma there are strands and nests of epithelioid cells, which have an infiltrating pattern and there are a few with cytoplasmic vacuoles. The findings are worrisome for an infiltrating neoplastic process and additional tissue may be indicated. There is insufficient tissue remaining in the current biopsy for additional studies.      07/11/2017 Procedure    CT-guided biopsy of the peritoneal thickening in the left lateral abdomen. Procedure was technically challenging due to the small amount of peritoneal thickening and close proximity of multiple bowel loops. False negative biopsy is possible.      07/11/2017 Pathology Results    Soft Tissue Needle Core Biopsy, omentum - ADENOCARCINOMA. - SEE COMMENT. Microscopic Comment The malignant cells are positive for CDX-2, cytokeratin 20, and p53. They are negative for cytokeratin 7, estrogen receptor, and PAX-8. This immunohistochemical profile argues against a gynecologic primary, and the differential diagnosis includes gastrointestinal primary.      07/12/2017 Imaging    Ct abdomen and pelvis 1. New small bowel obstruction with transition point seen in the right lower quadrant, possibly secondary to adhesions related to peritoneal carcinomatosis. No pneumatosis. 2. Findings suggestive of metastatic ovarian cancer. Slightly worsened abdominal ascites may be related to metastatic disease or reactive to the small-bowel obstruction. 3. Expected post biopsy changes in the left lower quadrant adjacent to peritoneal nodularity. No intra-abdominal hemorrhage.      07/12/2017 - 07/14/2017 Hospital Admission    She  was admitted for management of subacute bowel obstruction      07/15/2017 Cancer Staging    Staging form: Colon and Rectum, AJCC 8th Edition - Clinical: Stage Unknown (cTX, cNX, pM1) - Signed by Heath Lark, MD on 07/15/2017      07/18/2017 Procedure     Successful placement of a right arm PICC with sonographic and fluoroscopic guidance. The catheter is ready for use.      08/04/2017 Imaging    The previously noted ascites in the abdomen and pelvis has resolved. 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  No small bowel or colonic obstruction is identified.  Generalized thickened bowel wall of the sigmoid colon.  Mixed cystic and solid mass of the left ovary is unchanged.  Status post prior cholecystectomy with mild postsurgical intrahepatic biliary ductal dilatation.        REVIEW OF SYSTEMS:   Constitutional: Denies fevers, chills or abnormal weight loss Eyes: Denies blurriness of vision Ears, nose, mouth, throat, and face: Denies mucositis or sore throat Respiratory: Denies cough, dyspnea or wheezes Cardiovascular: Denies palpitation, chest discomfort or lower extremity swelling Skin: Denies abnormal skin rashes Lymphatics: Denies new lymphadenopathy or easy bruising Neurological:Denies numbness, tingling or new weaknesses Behavioral/Psych: Mood is stable, no new changes  All other systems were reviewed with the patient and are negative.  I have reviewed the past medical history, past surgical history, social history and family history with the patient and they are unchanged from previous note.  ALLERGIES:  is allergic to flagyl [metronidazole].  MEDICATIONS:  Current Outpatient Medications  Medication Sig Dispense Refill  . aspirin 81 MG chewable tablet Chew 1 tablet (81 mg total) by mouth daily. 30 tablet 11  . atorvastatin (LIPITOR) 10 MG tablet Take 1 tablet (10 mg total) by mouth daily. 90 tablet 3  . BRILINTA 90 MG TABS tablet TAKE 1 TABLET BY MOUTH TWICE A DAY 60 tablet 10  . dicyclomine (BENTYL) 10 MG capsule TAKE 1 CAPSULE BY MOUTH EVERY 8 HOURS AS NEEDED FOR SPASMS. FOR ABDOMINAL DISCOMFORT (Patient not taking: Reported on 07/27/2017) 90 capsule 5  . FLUoxetine (PROZAC) 10 MG capsule  TAKE 1 CAPSULE BY MOUTH EVERY DAY (Patient taking differently: TAKE 1 CAPSULE BY MOUTH EVERY other DAY) 30 capsule 0  . glycerin adult 2 g suppository Place 1 suppository rectally as needed for constipation. 12 suppository 0  . glycerin adult 2 g suppository Place 1 suppository rectally as needed for constipation. 12 suppository 0  . LORazepam (ATIVAN) 0.5 MG tablet Take 1 tablet (0.5 mg total) by mouth every 8 (eight) hours as needed for anxiety. 60 tablet 0  . magic mouthwash w/lidocaine SOLN Take 5 mLs by mouth 4 (four) times daily. Swish and spit. 480 mL 1  . nitroGLYCERIN (NITROSTAT) 0.4 MG SL tablet Place 1 tablet (0.4 mg total) under the tongue every 5 (five) minutes x 3 doses as needed for chest pain. 25 tablet 11  . ondansetron (ZOFRAN) 8 MG tablet Take 1 tablet (8 mg total) by mouth every 8 (eight) hours as needed for refractory nausea / vomiting. Start on day 3 after chemotherapy. 30 tablet 1  . prochlorperazine (COMPAZINE) 10 MG tablet Take 1 tablet (10 mg total) by mouth every 6 (six) hours as needed (Nausea or vomiting). 30 tablet 1  . traZODone (DESYREL) 50 MG tablet Take 0.5-1 tablets (25-50 mg total) at bedtime as needed by mouth for sleep. (Patient not taking: Reported on 07/27/2017) 30 tablet 2  No current facility-administered medications for this visit.     PHYSICAL EXAMINATION: ECOG PERFORMANCE STATUS: 1 - Symptomatic but completely ambulatory  Vitals:   08/04/17 1204  BP: 100/65  Pulse: 74  Resp: 18  Temp: (!) 97.5 F (36.4 C)  SpO2: 100%   Filed Weights   08/04/17 1204  Weight: 122 lb 1.6 oz (55.4 kg)    GENERAL:alert, no distress and comfortable SKIN: skin color, texture, turgor are normal, no rashes or significant lesions EYES: normal, Conjunctiva are pink and non-injected, sclera clear OROPHARYNX:no exudate, no erythema and lips, buccal mucosa, and tongue normal  NECK: supple, thyroid normal size, non-tender, without nodularity LYMPH:  no palpable  lymphadenopathy in the cervical, axillary or inguinal LUNGS: clear to auscultation and percussion with normal breathing effort HEART: regular rate & rhythm and no murmurs and no lower extremity edema ABDOMEN:abdomen soft, non-tender and normal bowel sounds Musculoskeletal:no cyanosis of digits and no clubbing  NEURO: alert & oriented x 3 with fluent speech, no focal motor/sensory deficits  LABORATORY DATA:  I have reviewed the data as listed    Component Value Date/Time   NA 138 08/04/2017 1119   K 4.1 08/04/2017 1119   CL 106 08/04/2017 1119   CO2 24 08/04/2017 1119   GLUCOSE 95 08/04/2017 1119   BUN 21 08/04/2017 1119   CREATININE 0.82 08/04/2017 1119   CALCIUM 9.3 08/04/2017 1119   PROT 6.8 08/04/2017 1119   PROT 6.7 11/28/2016 0849   ALBUMIN 3.1 (L) 08/04/2017 1119   ALBUMIN 4.3 11/28/2016 0849   AST 17 08/04/2017 1119   ALT 15 08/04/2017 1119   ALKPHOS 103 08/04/2017 1119   BILITOT 0.3 08/04/2017 1119   GFRNONAA >60 08/04/2017 1119   GFRAA >60 08/04/2017 1119    No results found for: SPEP, UPEP  Lab Results  Component Value Date   WBC 5.4 08/04/2017   NEUTROABS 2.8 08/04/2017   HGB 15.3 (H) 07/27/2017   HCT 39.5 08/04/2017   MCV 96.6 08/04/2017   PLT 196 08/04/2017      Chemistry      Component Value Date/Time   NA 138 08/04/2017 1119   K 4.1 08/04/2017 1119   CL 106 08/04/2017 1119   CO2 24 08/04/2017 1119   BUN 21 08/04/2017 1119   CREATININE 0.82 08/04/2017 1119      Component Value Date/Time   CALCIUM 9.3 08/04/2017 1119   ALKPHOS 103 08/04/2017 1119   AST 17 08/04/2017 1119   ALT 15 08/04/2017 1119   BILITOT 0.3 08/04/2017 1119       RADIOGRAPHIC STUDIES: I have reviewed her most recent CT imaging with the patient I have personally reviewed the radiological images as listed and agreed with the findings in the report. Ct Abdomen Pelvis W Contrast  Result Date: 07/27/2017 CLINICAL DATA:  Recently diagnosed with gastric cancer, unable to have  surgery. Patient started her first chemotherapy treatment. Patient states vomiting bile earlier today. EXAM: CT ABDOMEN AND PELVIS WITH CONTRAST TECHNIQUE: Multidetector CT imaging of the abdomen and pelvis was performed using the standard protocol following bolus administration of intravenous contrast. CONTRAST:  174m ISOVUE-300 IOPAMIDOL (ISOVUE-300) INJECTION 61% COMPARISON:  July 12, 2017 FINDINGS: Lower chest: No acute abnormality. Hepatobiliary: The liver is normal without focal liver lesion. Patient status post prior cholecystectomy. Mild postsurgical intrahepatic biliary ductal dilatation is identified. The common bile duct is normal. Pancreas: Unremarkable. No pancreatic ductal dilatation or surrounding inflammatory changes. Spleen: Normal in size without focal abnormality. Adrenals/Urinary Tract:  Hypertrophy of left adrenal gland is unchanged. The right adrenal gland is normal. Stable left kidney cyst in the upper pole is noted. The kidneys are otherwise unremarkable. There is no hydronephrosis bilaterally. The bladder is normal. Stomach/Bowel: There is wall thickening of the posterior fundal stomach. There is no obstruction of the stomach. There is no small bowel obstruction. No colonic obstruction is identified. The appendix is normal. The sigmoid colon is decompressed thickened bowel wall. Vascular/Lymphatic: Aortic atherosclerosis. No enlarged abdominal or pelvic lymph nodes. Reproductive: Mixed cystic and solid mass in the left ovary is unchanged. The uterus and right ovary are normal. Other: There is interval resolution of previously noted ascites. Omental nodularities of the left lower quadrant and right abdomen are unchanged. Musculoskeletal: No acute or significant osseous findings. Degenerative joint changes of the spine are noted. IMPRESSION: The previously noted ascites in the abdomen and pelvis has resolved. 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 No small bowel  or colonic obstruction is identified. Generalized thickened bowel wall of the sigmoid colon. Mixed cystic and solid mass of the left ovary is unchanged. Status post prior cholecystectomy with mild postsurgical intrahepatic biliary ductal dilatation. Electronically Signed   By: Abelardo Diesel M.D.   On: 07/27/2017 20:29   Ct Abdomen Pelvis W Contrast  Result Date: 07/12/2017 CLINICAL DATA:  Abdominal pain with nausea and vomiting. Percutaneous peritoneal implant biopsy yesterday. EXAM: CT ABDOMEN AND PELVIS WITH CONTRAST TECHNIQUE: Multidetector CT imaging of the abdomen and pelvis was performed using the standard protocol following bolus administration of intravenous contrast. CONTRAST:  161m ISOVUE-300 IOPAMIDOL (ISOVUE-300) INJECTION 61% COMPARISON:  CT abdomen pelvis dated June 27, 2017. FINDINGS: Lower chest: No acute abnormality. Hepatobiliary: No focal liver abnormality is seen. Status post cholecystectomy. No biliary dilatation. Pancreas: Unremarkable. No pancreatic ductal dilatation or surrounding inflammatory changes. Spleen: Normal in size without focal abnormality. Adrenals/Urinary Tract: The adrenal glands are unremarkable. Stable left renal cyst. No renal or ureteral calculi. The bladder is unremarkable. Stomach/Bowel: There are new multiple dilated loops of small bowel with a transition point seen in the right lower quadrant (series 3, image 52). The stomach is unremarkable. The colon is decompressed. Normal appendix. Vascular/Lymphatic: Aortic atherosclerosis. No enlarged abdominal or pelvic lymph nodes. Reproductive: Mixed cystic and solid mass in the left ovary is unchanged. The uterus and right ovary are unremarkable. Other: Slight interval increase in intra-abdominal ascites. Omental nodularity in the left lower quadrant and right abdomen is largely unchanged. There are a few foci of air around the left lower quadrant peritoneal nodularity at the site of biopsy. Musculoskeletal: Postprocedure  changes in the left lower anterior abdominal wall. No acute or suspicious osseous findings. IMPRESSION: 1. New small bowel obstruction with transition point seen in the right lower quadrant, possibly secondary to adhesions related to peritoneal carcinomatosis. No pneumatosis. 2. Findings suggestive of metastatic ovarian cancer. Slightly worsened abdominal ascites may be related to metastatic disease or reactive to the small-bowel obstruction. 3. Expected post biopsy changes in the left lower quadrant adjacent to peritoneal nodularity. No intra-abdominal hemorrhage. Electronically Signed   By: WTitus DubinM.D.   On: 07/12/2017 07:34   Ct Biopsy  Result Date: 07/11/2017 INDICATION: 67year old with spindle cell neoplasm of the cervix and evidence for peritoneal carcinomatosis. There is also concern for ovarian lesions. EXAM: CT-GUIDED PERITONEAL BIOPSY MEDICATIONS: None. ANESTHESIA/SEDATION: Moderate (conscious) sedation was employed during this procedure. A total of Versed 4.0 mg and Fentanyl 200 mcg was administered intravenously. Moderate Sedation Time:  44 minutes. The patient's level of consciousness and vital signs were monitored continuously by radiology nursing throughout the procedure under my direct supervision. FLUOROSCOPY TIME:  None COMPLICATIONS: None immediate. PROCEDURE: Informed written consent was obtained from the patient after a thorough discussion of the procedural risks, benefits and alternatives. All questions were addressed. A timeout was performed prior to the initiation of the procedure. Patient was placed supine on CT scanner. Images through the abdomen were obtained. The peritoneal thickening in the left lower abdomen was targeted. The anterior and left side of the abdomen was prepped with chlorhexidine and a sterile field was created. Skin was anesthetized with 1% lidocaine. 17 gauge coaxial needle was directed towards this peritoneal thickening from medial to lateral approach with  CT guidance. Needle was repositioned multiple times. It was very difficult to position the coaxial needle within the peritoneal thickening without risking bowel injury. Eventually, the needle was felt to be safely along the periphery of the peritoneal cavity. A total of 3 core biopsies were obtained with an 18 core device. Tiny pieces of tissue were collected and placed in formalin. Needle was removed without complication. FINDINGS: There is peritoneal thickening in the lateral left lower abdomen. There is also mild peritoneal thickening along the anterior abdomen and a small amount of ascites. Needle was positioned along the superficial aspect of the left lateral peritoneal cavity. Small amount of intraperitoneal gas at the biopsy site following the procedure. No significant bleeding. No evidence for bowel injury. IMPRESSION: CT-guided biopsy of the peritoneal thickening in the left lateral abdomen. Procedure was technically challenging due to the small amount of peritoneal thickening and close proximity of multiple bowel loops. False negative biopsy is possible. Electronically Signed   By: Markus Daft M.D.   On: 07/11/2017 18:16   Dg Abd Acute W/chest  Result Date: 07/27/2017 CLINICAL DATA:  Colorectal cancer.  Nausea and vomiting. EXAM: DG ABDOMEN ACUTE W/ 1V CHEST COMPARISON:  07/14/2017 FINDINGS: The lungs are clear without focal pneumonia, edema, pneumothorax or pleural effusion. The cardiopericardial silhouette is within normal limits for size. Nodular opacity overlying the right lower lung is compatible with a nipple shadow as confirmed on upright abdomen film. Right PICC line tip overlies the mid to distal SVC. Upright film shows no evidence for intraperitoneal free air. Supine film shows no gaseous small bowel dilatation to suggest obstruction. Moderate stool volume seen along the length of the colon. Visualized bony anatomy unremarkable. IMPRESSION: 1. No acute cardiopulmonary findings. 2. No evidence  for small bowel obstruction. No intraperitoneal free air. 3. Moderate stool volume. Electronically Signed   By: Misty Stanley M.D.   On: 07/27/2017 18:09   Dg Abd Acute W/chest  Result Date: 07/14/2017 CLINICAL DATA:  History of small bowel obstruction with NG tube treatment. Patient has history of pneumonia and diverticulitis. Previous cholecystectomy. EXAM: DG ABDOMEN ACUTE W/ 1V CHEST COMPARISON:  [facility] of July 12, 2017 FINDINGS: The lungs are well-expanded and clear. The heart and pulmonary vascularity are normal. The mediastinum is normal in width. There is no significant pleural effusion. The esophagogastric tube proximal port lies above the GE junction with the tip in the region of the gastric cardia. Within the abdomen there is contrast within normal caliber colon and rectum. No small bowel contrast or air is observed. There is some gas within the stomach. There surgical clips in the gallbladder fossa. There is barium within sigmoid diverticula. The bony structures exhibit no acute abnormalities. IMPRESSION: Mild hyperinflation consistent with COPD.  Advancement of the nasogastric tube by 5-10 cm is recommended. Within the abdomen there is no evidence of bowel obstruction, ileus, or perforation. There is barium within diverticula in the sigmoid colon. Electronically Signed   By: David  Martinique M.D.   On: 07/14/2017 08:18   Dg Abd Portable 1v-small Bowel Obstruction Protocol-initial, 8 Hr Delay  Result Date: 07/12/2017 CLINICAL DATA:  Small bowel obstruction. EXAM: PORTABLE ABDOMEN - 1 VIEW COMPARISON:  Earlier the same day. FINDINGS: NG tube tip is in the stomach. Gaseous distention of small bowel noted in the mid and left upper abdomen with small bowel loops measuring up to 3.7 cm diameter. There is probably some contrast in the right colon although this is not definite. Surgical clips in the right upper quadrant suggest prior cholecystectomy. IMPRESSION: Similar distention of small bowel in the left  abdomen measuring up to 3.7 cm diameter. Probable contrast in the right colon although not definite. Electronically Signed   By: Misty Stanley M.D.   On: 07/12/2017 19:44   Dg Abd Portable 1v-small Bowel Protocol-position Verification  Result Date: 07/12/2017 CLINICAL DATA:  Nasogastric tube placement EXAM: PORTABLE ABDOMEN - 1 VIEW COMPARISON:  Portable exam 0846 hours compared to CT abdomen and pelvis 07/12/2017 FINDINGS: Tip of nasogastric tube projects over proximal to mid stomach. Dilated small bowel loops in mid abdomen. Excreted contrast material within nondilated renal collecting systems and a partially distended urinary bladder. Bones demineralized. IMPRESSION: Tip of nasogastric tube projects over proximal to mid stomach. Electronically Signed   By: Lavonia Dana M.D.   On: 07/12/2017 09:23   Ir Picc Placement Right >5 Yrs Inc Img Guide  Result Date: 07/18/2017 INDICATION: Colon cancer.  Request PICC line placement for IV therapies. EXAM: RIGHT UPPER EXTREMITY PICC LINE PLACEMENT WITH ULTRASOUND AND FLUOROSCOPIC GUIDANCE MEDICATIONS: None; ANESTHESIA/SEDATION: Moderate Sedation Time:  None The patient was continuously monitored during the procedure by the interventional radiology nurse under my direct supervision. FLUOROSCOPY TIME:  Fluoroscopy Time: 12 seconds COMPLICATIONS: None immediate. PROCEDURE: The patient was advised of the possible risks and complications and agreed to undergo the procedure. The patient was then brought to the angiographic suite for the procedure. The right arm was prepped with chlorhexidine, draped in the usual sterile fashion using maximum barrier technique (cap and mask, sterile gown, sterile gloves, large sterile sheet, hand hygiene and cutaneous antiseptic). Local anesthesia was attained by infiltration with 1% lidocaine. Ultrasound demonstrated patency of the basilic vein, and this was documented with an image. Under real-time ultrasound guidance, this vein was  accessed with a 21 gauge micropuncture needle and image documentation was performed. The needle was exchanged over a guidewire for a peel-away sheath through which a 39 cm 5 [place] single lumen power injectable PICC was advanced, and positioned with its tip at the lower SVC/right atrial junction. Fluoroscopy during the procedure and fluoro spot radiograph confirms appropriate catheter position. The catheter was flushed, secured to the skin, and covered with a sterile dressing. IMPRESSION: Successful placement of a right arm PICC with sonographic and fluoroscopic guidance. The catheter is ready for use. Read by: Ascencion Dike PA-C Electronically Signed   By: Marybelle Killings M.D.   On: 07/18/2017 16:56    All questions were answered. The patient knows to call the clinic with any problems, questions or concerns. No barriers to learning was detected.  I spent 25 minutes counseling the patient face to face. The total time spent in the appointment was 40 minutes and more than 50%  was on counseling and review of test results  Heath Lark, MD 08/04/2017 12:40 PM

## 2017-08-04 NOTE — Assessment & Plan Note (Signed)
She denies recent weight loss She will continue low residual diet If her symptoms continue to improve, I plan to  advance her diet as tolerated the next time I see her

## 2017-08-04 NOTE — Assessment & Plan Note (Signed)
Overall, her symptoms are resolving I plan to repeat imaging study in 3 months. We will continue treatment as scheduled

## 2017-08-04 NOTE — Assessment & Plan Note (Signed)
She had recent severe constipation With resolution of ascites and positive response to treatment, I recommend gentle laxatives as tolerated

## 2017-08-04 NOTE — Progress Notes (Signed)
Before administering medications, Leucovorin and Oxaliplatin, patient c/o feeling nauseous.  Pre Medications, Aloxi and Decadron,  administered prior per [facility].  Sandi Mealy, PA-C notified.   Verbal order to give Ativan 0.5mg  IV one time dose with read back.  Patient voiced relief shortly after Ativan was administered.

## 2017-08-06 ENCOUNTER — Ambulatory Visit: Payer: Medicare Other

## 2017-08-06 DIAGNOSIS — F419 Anxiety disorder, unspecified: Secondary | ICD-10-CM | POA: Diagnosis not present

## 2017-08-06 DIAGNOSIS — Z7982 Long term (current) use of aspirin: Secondary | ICD-10-CM | POA: Diagnosis not present

## 2017-08-06 DIAGNOSIS — Z7902 Long term (current) use of antithrombotics/antiplatelets: Secondary | ICD-10-CM | POA: Diagnosis not present

## 2017-08-06 DIAGNOSIS — R634 Abnormal weight loss: Secondary | ICD-10-CM | POA: Diagnosis not present

## 2017-08-06 DIAGNOSIS — C189 Malignant neoplasm of colon, unspecified: Secondary | ICD-10-CM | POA: Diagnosis not present

## 2017-08-06 DIAGNOSIS — Z452 Encounter for adjustment and management of vascular access device: Secondary | ICD-10-CM | POA: Diagnosis not present

## 2017-08-06 MED ORDER — SODIUM CHLORIDE 0.9% FLUSH
10.0000 mL | INTRAVENOUS | Status: DC | PRN
  Filled 2017-08-06: qty 10

## 2017-08-06 MED ORDER — HEPARIN SOD (PORK) LOCK FLUSH 100 UNIT/ML IV SOLN
500.0000 [IU] | Freq: Once | INTRAVENOUS | Status: DC | PRN
  Filled 2017-08-06: qty 5

## 2017-08-06 NOTE — Progress Notes (Unsigned)
Pt did not show up for to get pump d/c. Called home and mobile phone and received the vmail. Also called the emergency contact number and received vmail from that number. Called infusion room to make aware that pt has not shown up for appt and has not answered calls. Porsche Cates LPN

## 2017-08-11 ENCOUNTER — Telehealth: Payer: Self-pay

## 2017-08-11 NOTE — Telephone Encounter (Signed)
Received VM from pt regarding she's been so sick, vomiting over weekend, and constipated.  Also noted per [facility] LPN notes that pt did not come in to have pump removed. Called pt and she reported that the pump was removed on Wednesday by Home Health nurse.    Asked pt per telephone advice record dated 4-22, that she spoke with a nurse earlier (6:58am), they recommended ER visit but pt declined then.  Asked her if she would like to come in to be seen by symptom management?  Pt reports she is feeling better today and has not vomited today.    She reports over weekend she had vomiting and had a pain at her belly button, "felt like trapped gas" and was not having much of bm.  She said she is using Milk of Mag every other day or daily per Dr Alvy Bimler and reports was able to move bowels this am better than weekend was.  She wants to have her IVFs at home today and see if it helps her energy level.  She is taking Zofran and Compazine as directed.  Discussed with her trying her nausea med about 30 min before she tries to eat something.  Pt voiced understanding and wants to try that as well.    Explained a visit with symptom management as a option, pt said since she feels better, she will see how today goes after IVFs and if she will call if she needs an appt. Reminded her to call back for worsening symptoms, energy not improving, or fever.  Voiced understanding. Routed note to Dr Alvy Bimler also.

## 2017-08-13 ENCOUNTER — Inpatient Hospital Stay (HOSPITAL_COMMUNITY)
Admission: EM | Admit: 2017-08-13 | Discharge: 2017-08-21 | DRG: 374 | Disposition: A | Discharge status: Hospice | Payer: Medicare Other | Attending: Internal Medicine | Admitting: Internal Medicine

## 2017-08-13 ENCOUNTER — Emergency Department (HOSPITAL_COMMUNITY): Payer: Medicare Other

## 2017-08-13 ENCOUNTER — Encounter (HOSPITAL_COMMUNITY): Payer: Self-pay

## 2017-08-13 ENCOUNTER — Inpatient Hospital Stay: Payer: Medicare Other

## 2017-08-13 ENCOUNTER — Inpatient Hospital Stay (HOSPITAL_BASED_OUTPATIENT_CLINIC_OR_DEPARTMENT_OTHER): Payer: Medicare Other | Admitting: Medical

## 2017-08-13 ENCOUNTER — Other Ambulatory Visit: Payer: Self-pay

## 2017-08-13 ENCOUNTER — Telehealth: Payer: Self-pay | Admitting: Medical Oncology

## 2017-08-13 ENCOUNTER — Other Ambulatory Visit (HOSPITAL_COMMUNITY): Payer: Self-pay

## 2017-08-13 VITALS — BP 130/87 | HR 103 | Temp 97.8°F | Resp 18 | Ht 65.0 in | Wt 120.5 lb

## 2017-08-13 DIAGNOSIS — Z965 Presence of tooth-root and mandibular implants: Secondary | ICD-10-CM | POA: Diagnosis present

## 2017-08-13 DIAGNOSIS — Z743 Need for continuous supervision: Secondary | ICD-10-CM | POA: Diagnosis not present

## 2017-08-13 DIAGNOSIS — Z955 Presence of coronary angioplasty implant and graft: Secondary | ICD-10-CM | POA: Diagnosis not present

## 2017-08-13 DIAGNOSIS — K56609 Unspecified intestinal obstruction, unspecified as to partial versus complete obstruction: Secondary | ICD-10-CM | POA: Diagnosis not present

## 2017-08-13 DIAGNOSIS — E43 Unspecified severe protein-calorie malnutrition: Secondary | ICD-10-CM

## 2017-08-13 DIAGNOSIS — C189 Malignant neoplasm of colon, unspecified: Secondary | ICD-10-CM

## 2017-08-13 DIAGNOSIS — I429 Cardiomyopathy, unspecified: Secondary | ICD-10-CM | POA: Diagnosis not present

## 2017-08-13 DIAGNOSIS — Z79899 Other long term (current) drug therapy: Secondary | ICD-10-CM | POA: Diagnosis not present

## 2017-08-13 DIAGNOSIS — F339 Major depressive disorder, recurrent, unspecified: Secondary | ICD-10-CM | POA: Diagnosis not present

## 2017-08-13 DIAGNOSIS — R109 Unspecified abdominal pain: Secondary | ICD-10-CM | POA: Diagnosis not present

## 2017-08-13 DIAGNOSIS — R1110 Vomiting, unspecified: Secondary | ICD-10-CM | POA: Diagnosis not present

## 2017-08-13 DIAGNOSIS — F419 Anxiety disorder, unspecified: Secondary | ICD-10-CM | POA: Diagnosis not present

## 2017-08-13 DIAGNOSIS — R739 Hyperglycemia, unspecified: Secondary | ICD-10-CM | POA: Diagnosis not present

## 2017-08-13 DIAGNOSIS — R140 Abdominal distension (gaseous): Secondary | ICD-10-CM

## 2017-08-13 DIAGNOSIS — E1159 Type 2 diabetes mellitus with other circulatory complications: Secondary | ICD-10-CM | POA: Diagnosis not present

## 2017-08-13 DIAGNOSIS — G893 Neoplasm related pain (acute) (chronic): Secondary | ICD-10-CM | POA: Diagnosis not present

## 2017-08-13 DIAGNOSIS — Z7189 Other specified counseling: Secondary | ICD-10-CM

## 2017-08-13 DIAGNOSIS — C786 Secondary malignant neoplasm of retroperitoneum and peritoneum: Secondary | ICD-10-CM

## 2017-08-13 DIAGNOSIS — I5032 Chronic diastolic (congestive) heart failure: Secondary | ICD-10-CM | POA: Diagnosis present

## 2017-08-13 DIAGNOSIS — Z808 Family history of malignant neoplasm of other organs or systems: Secondary | ICD-10-CM | POA: Diagnosis not present

## 2017-08-13 DIAGNOSIS — Z8249 Family history of ischemic heart disease and other diseases of the circulatory system: Secondary | ICD-10-CM

## 2017-08-13 DIAGNOSIS — Z87891 Personal history of nicotine dependence: Secondary | ICD-10-CM | POA: Diagnosis not present

## 2017-08-13 DIAGNOSIS — R634 Abnormal weight loss: Secondary | ICD-10-CM

## 2017-08-13 DIAGNOSIS — Z66 Do not resuscitate: Secondary | ICD-10-CM | POA: Diagnosis not present

## 2017-08-13 DIAGNOSIS — Z8701 Personal history of pneumonia (recurrent): Secondary | ICD-10-CM | POA: Diagnosis not present

## 2017-08-13 DIAGNOSIS — E441 Mild protein-calorie malnutrition: Secondary | ICD-10-CM | POA: Diagnosis not present

## 2017-08-13 DIAGNOSIS — I2510 Atherosclerotic heart disease of native coronary artery without angina pectoris: Secondary | ICD-10-CM | POA: Diagnosis present

## 2017-08-13 DIAGNOSIS — Z888 Allergy status to other drugs, medicaments and biological substances status: Secondary | ICD-10-CM

## 2017-08-13 DIAGNOSIS — Z7902 Long term (current) use of antithrombotics/antiplatelets: Secondary | ICD-10-CM | POA: Diagnosis not present

## 2017-08-13 DIAGNOSIS — R279 Unspecified lack of coordination: Secondary | ICD-10-CM | POA: Diagnosis not present

## 2017-08-13 DIAGNOSIS — Z7982 Long term (current) use of aspirin: Secondary | ICD-10-CM | POA: Diagnosis not present

## 2017-08-13 DIAGNOSIS — K56600 Partial intestinal obstruction, unspecified as to cause: Secondary | ICD-10-CM | POA: Diagnosis not present

## 2017-08-13 DIAGNOSIS — Z6820 Body mass index (BMI) 20.0-20.9, adult: Secondary | ICD-10-CM

## 2017-08-13 DIAGNOSIS — R112 Nausea with vomiting, unspecified: Secondary | ICD-10-CM

## 2017-08-13 DIAGNOSIS — I252 Old myocardial infarction: Secondary | ICD-10-CM

## 2017-08-13 DIAGNOSIS — F411 Generalized anxiety disorder: Secondary | ICD-10-CM | POA: Diagnosis not present

## 2017-08-13 DIAGNOSIS — I255 Ischemic cardiomyopathy: Secondary | ICD-10-CM | POA: Diagnosis present

## 2017-08-13 DIAGNOSIS — C19 Malignant neoplasm of rectosigmoid junction: Secondary | ICD-10-CM | POA: Diagnosis not present

## 2017-08-13 DIAGNOSIS — R110 Nausea: Secondary | ICD-10-CM | POA: Diagnosis not present

## 2017-08-13 DIAGNOSIS — E876 Hypokalemia: Secondary | ICD-10-CM | POA: Diagnosis not present

## 2017-08-13 DIAGNOSIS — Z515 Encounter for palliative care: Secondary | ICD-10-CM | POA: Diagnosis not present

## 2017-08-13 DIAGNOSIS — F418 Other specified anxiety disorders: Secondary | ICD-10-CM | POA: Diagnosis present

## 2017-08-13 DIAGNOSIS — G629 Polyneuropathy, unspecified: Secondary | ICD-10-CM | POA: Diagnosis not present

## 2017-08-13 DIAGNOSIS — I25118 Atherosclerotic heart disease of native coronary artery with other forms of angina pectoris: Secondary | ICD-10-CM | POA: Diagnosis not present

## 2017-08-13 DIAGNOSIS — E785 Hyperlipidemia, unspecified: Secondary | ICD-10-CM | POA: Diagnosis present

## 2017-08-13 DIAGNOSIS — R971 Elevated cancer antigen 125 [CA 125]: Secondary | ICD-10-CM

## 2017-08-13 DIAGNOSIS — C562 Malignant neoplasm of left ovary: Secondary | ICD-10-CM | POA: Diagnosis present

## 2017-08-13 DIAGNOSIS — D390 Neoplasm of uncertain behavior of uterus: Secondary | ICD-10-CM | POA: Diagnosis not present

## 2017-08-13 DIAGNOSIS — M5030 Other cervical disc degeneration, unspecified cervical region: Secondary | ICD-10-CM | POA: Diagnosis present

## 2017-08-13 DIAGNOSIS — M5136 Other intervertebral disc degeneration, lumbar region: Secondary | ICD-10-CM | POA: Diagnosis present

## 2017-08-13 DIAGNOSIS — R1030 Lower abdominal pain, unspecified: Secondary | ICD-10-CM

## 2017-08-13 DIAGNOSIS — C801 Malignant (primary) neoplasm, unspecified: Secondary | ICD-10-CM

## 2017-08-13 DIAGNOSIS — R1084 Generalized abdominal pain: Secondary | ICD-10-CM | POA: Diagnosis not present

## 2017-08-13 DIAGNOSIS — Z87898 Personal history of other specified conditions: Secondary | ICD-10-CM

## 2017-08-13 DIAGNOSIS — Z9049 Acquired absence of other specified parts of digestive tract: Secondary | ICD-10-CM

## 2017-08-13 DIAGNOSIS — Z452 Encounter for adjustment and management of vascular access device: Secondary | ICD-10-CM | POA: Diagnosis not present

## 2017-08-13 LAB — CBC WITH DIFFERENTIAL (CANCER CENTER ONLY)
Basophils Absolute: 0 10*3/uL (ref 0.0–0.1)
Basophils Relative: 0 %
EOS ABS: 0 10*3/uL (ref 0.0–0.5)
EOS PCT: 0 %
HCT: 40.9 % (ref 34.8–46.6)
Hemoglobin: 14 g/dL (ref 11.6–15.9)
LYMPHS PCT: 10 %
Lymphs Abs: 0.9 10*3/uL (ref 0.9–3.3)
MCH: 32.9 pg (ref 25.1–34.0)
MCHC: 34.3 g/dL (ref 31.5–36.0)
MCV: 95.9 fL (ref 79.5–101.0)
MONO ABS: 1.1 10*3/uL — AB (ref 0.1–0.9)
MONOS PCT: 13 %
Neutro Abs: 6.4 10*3/uL (ref 1.5–6.5)
Neutrophils Relative %: 77 %
PLATELETS: 236 10*3/uL (ref 145–400)
RBC: 4.27 MIL/uL (ref 3.70–5.45)
RDW: 12.6 % (ref 11.2–14.5)
WBC: 8.4 10*3/uL (ref 3.9–10.3)

## 2017-08-13 LAB — CMP (CANCER CENTER ONLY)
ALT: 13 U/L (ref 0–55)
ANION GAP: 3 (ref 3–11)
AST: 14 U/L (ref 5–34)
Albumin: 3.2 g/dL — ABNORMAL LOW (ref 3.5–5.0)
Alkaline Phosphatase: 86 U/L (ref 40–150)
BUN: 28 mg/dL — ABNORMAL HIGH (ref 7–26)
CALCIUM: 9.1 mg/dL (ref 8.4–10.4)
CHLORIDE: 102 mmol/L (ref 98–109)
CO2: 32 mmol/L — ABNORMAL HIGH (ref 22–29)
Creatinine: 0.8 mg/dL (ref 0.60–1.10)
Glucose, Bld: 99 mg/dL (ref 70–140)
Potassium: 3.6 mmol/L (ref 3.5–5.1)
SODIUM: 137 mmol/L (ref 136–145)
Total Bilirubin: 0.7 mg/dL (ref 0.2–1.2)
Total Protein: 6.6 g/dL (ref 6.4–8.3)

## 2017-08-13 LAB — URINALYSIS, ROUTINE W REFLEX MICROSCOPIC
Bacteria, UA: NONE SEEN
Bilirubin Urine: NEGATIVE
Glucose, UA: NEGATIVE mg/dL
Ketones, ur: 20 mg/dL — AB
Leukocytes, UA: NEGATIVE
Nitrite: NEGATIVE
PH: 5 (ref 5.0–8.0)
Protein, ur: NEGATIVE mg/dL
Specific Gravity, Urine: 1.026 (ref 1.005–1.030)

## 2017-08-13 LAB — I-STAT CG4 LACTIC ACID, ED: Lactic Acid, Venous: 0.71 mmol/L (ref 0.5–1.9)

## 2017-08-13 LAB — CEA (IN HOUSE-CHCC): CEA (CHCC-IN HOUSE): 3.9 ng/mL (ref 0.00–5.00)

## 2017-08-13 LAB — LIPASE, BLOOD: LIPASE: 39 U/L (ref 11–51)

## 2017-08-13 MED ORDER — TICAGRELOR 90 MG PO TABS
90.0000 mg | ORAL_TABLET | Freq: Two times a day (BID) | ORAL | Status: DC
  Administered 2017-08-14 – 2017-08-18 (×10): 90 mg via ORAL
  Filled 2017-08-13 (×11): qty 1

## 2017-08-13 MED ORDER — MORPHINE SULFATE (PF) 4 MG/ML IV SOLN
INTRAVENOUS | Status: AC
  Filled 2017-08-13: qty 1

## 2017-08-13 MED ORDER — HYDROCODONE-ACETAMINOPHEN 5-325 MG PO TABS: 1.0000 | ORAL_TABLET | ORAL | 0 refills | Status: DC | PRN

## 2017-08-13 MED ORDER — SODIUM CHLORIDE 0.9% FLUSH
10.0000 mL | Freq: Once | INTRAVENOUS | Status: AC
  Administered 2017-08-13: 10 mL
  Filled 2017-08-13: qty 10

## 2017-08-13 MED ORDER — IOPAMIDOL (ISOVUE-300) INJECTION 61%
INTRAVENOUS | Status: AC
  Filled 2017-08-13: qty 100

## 2017-08-13 MED ORDER — MORPHINE SULFATE (PF) 4 MG/ML IV SOLN
4.0000 mg | Freq: Once | INTRAVENOUS | Status: AC
  Administered 2017-08-13: 4 mg via INTRAVENOUS
  Filled 2017-08-13: qty 1

## 2017-08-13 MED ORDER — PROCHLORPERAZINE EDISYLATE 10 MG/2ML IJ SOLN
5.0000 mg | Freq: Four times a day (QID) | INTRAMUSCULAR | Status: DC | PRN
  Administered 2017-08-14 – 2017-08-18 (×9): 5 mg via INTRAVENOUS
  Filled 2017-08-13 (×9): qty 2

## 2017-08-13 MED ORDER — NITROGLYCERIN 0.4 MG SL SUBL: 0.4000 mg | SUBLINGUAL_TABLET | SUBLINGUAL | Status: DC | PRN

## 2017-08-13 MED ORDER — DEXTROSE-NACL 5-0.9 % IV SOLN
INTRAVENOUS | Status: AC
  Administered 2017-08-13 – 2017-08-14 (×2): via INTRAVENOUS

## 2017-08-13 MED ORDER — ACETAMINOPHEN 650 MG RE SUPP: 650.0000 mg | Freq: Four times a day (QID) | RECTAL | Status: DC | PRN

## 2017-08-13 MED ORDER — ONDANSETRON HCL 4 MG/2ML IJ SOLN
4.0000 mg | Freq: Four times a day (QID) | INTRAMUSCULAR | Status: DC | PRN
  Administered 2017-08-14 – 2017-08-17 (×9): 4 mg via INTRAVENOUS
  Filled 2017-08-13 (×10): qty 2

## 2017-08-13 MED ORDER — ASPIRIN 81 MG PO CHEW
81.0000 mg | CHEWABLE_TABLET | Freq: Every day | ORAL | Status: DC
  Administered 2017-08-14 – 2017-08-18 (×5): 81 mg via ORAL
  Filled 2017-08-13 (×5): qty 1

## 2017-08-13 MED ORDER — ONDANSETRON HCL 4 MG/2ML IJ SOLN
INTRAMUSCULAR | Status: AC
  Filled 2017-08-13: qty 2

## 2017-08-13 MED ORDER — ACETAMINOPHEN 325 MG PO TABS: 650.0000 mg | ORAL_TABLET | Freq: Four times a day (QID) | ORAL | Status: DC | PRN

## 2017-08-13 MED ORDER — ONDANSETRON HCL 4 MG/2ML IJ SOLN: 4.0000 mg | Freq: Once | INTRAMUSCULAR | Status: DC

## 2017-08-13 MED ORDER — SODIUM CHLORIDE 0.9 % IJ SOLN
INTRAMUSCULAR | Status: AC
  Filled 2017-08-13: qty 50

## 2017-08-13 MED ORDER — ATORVASTATIN CALCIUM 10 MG PO TABS
10.0000 mg | ORAL_TABLET | Freq: Every day | ORAL | Status: DC
  Administered 2017-08-14 – 2017-08-16 (×3): 10 mg via ORAL
  Filled 2017-08-13 (×3): qty 1

## 2017-08-13 MED ORDER — SODIUM CHLORIDE 0.9 % IV SOLN: Freq: Once | INTRAVENOUS | Status: DC

## 2017-08-13 MED ORDER — IOPAMIDOL (ISOVUE-300) INJECTION 61%
100.0000 mL | Freq: Once | INTRAVENOUS | Status: AC | PRN
  Administered 2017-08-13: 100 mL via INTRAVENOUS

## 2017-08-13 MED ORDER — SODIUM CHLORIDE 0.9 % IV SOLN
Freq: Once | INTRAVENOUS | Status: DC
  Administered 2017-08-13: 16:00:00 via INTRAVENOUS

## 2017-08-13 MED ORDER — IOPAMIDOL (ISOVUE-300) INJECTION 61%
30.0000 mL | Freq: Once | INTRAVENOUS | Status: AC | PRN
  Administered 2017-08-13: 30 mL via ORAL

## 2017-08-13 MED ORDER — MORPHINE SULFATE 4 MG/ML IJ SOLN
2.0000 mg | Freq: Once | INTRAMUSCULAR | Status: AC
  Administered 2017-08-13: 2 mg via INTRAVENOUS
  Filled 2017-08-13: qty 1

## 2017-08-13 MED ORDER — SODIUM CHLORIDE 0.9 % IV BOLUS
1000.0000 mL | Freq: Once | INTRAVENOUS | Status: AC
  Administered 2017-08-13: 1000 mL via INTRAVENOUS

## 2017-08-13 MED ORDER — MORPHINE SULFATE (PF) 2 MG/ML IV SOLN: 2.0000 mg | INTRAVENOUS | Status: DC | PRN

## 2017-08-13 MED ORDER — ONDANSETRON HCL 4 MG PO TABS
4.0000 mg | ORAL_TABLET | Freq: Four times a day (QID) | ORAL | Status: DC | PRN
  Filled 2017-08-13: qty 1

## 2017-08-13 MED ORDER — IOPAMIDOL (ISOVUE-300) INJECTION 61%
INTRAVENOUS | Status: AC
  Filled 2017-08-13: qty 30

## 2017-08-13 NOTE — Telephone Encounter (Signed)
[facility] RN , saw pt today and reports pt not doing well. She is getting IVF now, but pt feeling weak , faint, hypotensive , intermittent abd pains described as spasms and  7/10-no pain meds. She took dicyclomine and the spasms are farther apart and not as strong now. Last BM yesterday and was normal -prior to that she was passing hard balls of stools.  . She is only eating crackers and yogurt and jello. " I have never had a week like this". Just finished IV fluids via PICC. [place] appts made.

## 2017-08-13 NOTE — Patient Instructions (Signed)

## 2017-08-13 NOTE — ED Notes (Signed)
Bed: PP94 Expected date:  Expected time:  Means of arrival:  Comments: Cancer Center/abd. pain

## 2017-08-13 NOTE — Progress Notes (Signed)
[facility]  Carol Ferguson is an active pt with [facility] receiving IV hydration fluids at home.  [facility] team will follow pt while here to support transition home when ready.  If patient discharges after hours, please call [phone].   Larry Sierras 08/13/2017, 6:02 PM

## 2017-08-13 NOTE — H&P (Signed)
History and Physical    Carol Ferguson [id number] DOB: 02/09/1951 DOA: 08/13/2017  PCP: [name], PA-C  Patient coming from: Home.  Chief Complaint: Abdominal pain.  HPI: Carol Ferguson is a 67 y.o. female with history of peritoneal carcinomatosis being followed by oncologist and is receiving chemotherapy, CAD status post stenting for ST elevation MI in June 2018 on aspirin and Brilinta presents to the ER because of persistent abdominal pain.  Patient has been having abdominal pain over the last 1 week.  This is a 1 week following the chemotherapy.  Had multiple bouts of vomiting 5 days ago following which patient has not had much food and has not had any further episodes of vomiting.  Last bowel movement was around 3 days ago.  Pain is mostly diffuse and sometimes colicky appearance denies any chest pain or shortness of breath but tender with abdominal pain radiates to her chest.  ED Course: In the ER patient had CT abdomen and pelvis which shows which is concerning for small bowel obstruction and Dr. Kieth Brightly on-call surgeon was already consulted and patient is being admitted for further management.  Review of Systems: As per HPI, rest all negative.   Past Medical History:  Diagnosis Date  . Anxiety   . 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 CAD (coronary artery disease)    a. Inf STEMI/LHC 09/2016 which showed occluded mid LCx treated with PCI, DES; remaining cath details included a proximal to mid LAD lesion of 20% stenosis, and proximal to mid RCA 25% stenosis. EF 55-60%.  . Depression   . Diverticulosis   . Gallstones   . Ischemic cardiomyopathy    a. 2D Echo 09/28/16 showed EF 55-60%, probable severe hypokinesis of the entire inferiormyocardium, normal diastolic parameters.  . Myocardial infarction ([place]) 09/26/2016  . Pneumonia   . Presence of tooth-root and mandibular implants   . Tobacco abuse   . Wears glasses     Past Surgical History:  Procedure  Laterality Date  . CERVICAL CONIZATION W/BX N/A 01/28/2017   Procedure: CONIZATION CERVIX WITH BIOPSY;  Surgeon: [name], MD;  Location: [facility]  Service: Gynecology;  Laterality: N/A;  . CHOLECYSTECTOMY    . CORONARY STENT INTERVENTION N/A 09/26/2016   Procedure: Coronary Stent Intervention;  Surgeon: Belva Crome, MD;  Location: [place] [facility];  Service: Cardiovascular;  Laterality: N/A;  . DILATION AND CURETTAGE OF UTERUS N/A 01/28/2017   Procedure: DILATATION AND CURETTAGE WITH ULTRASOUND GUIDENCE;  Surgeon: [name], MD;  Location: [place];  Service: Gynecology;  Laterality: N/A;  . LEFT HEART CATH AND CORONARY ANGIOGRAPHY N/A 09/26/2016   Procedure: Left Heart Cath and Coronary Angiography;  Surgeon: Belva Crome, MD;  Location: [place] [facility];  Service: Cardiovascular;  Laterality: N/A;     reports that she quit smoking about 10 months ago. Her smoking use included cigarettes. She has a 35.00 pack-year smoking history. She has never used smokeless tobacco. She reports that she drinks alcohol. She reports that she does not use drugs.  Allergies  Allergen Reactions  . Flagyl [Metronidazole] Nausea Only    Family History  Problem Relation Age of Onset  . Cancer Brother        MELANOMA  . Heart disease Father   . Stroke Father   . Heart disease Brother   . Diabetes Brother   . Heart attack Brother   . Heart  failure Brother     Prior to Admission medications   Medication Sig Start Date End Date Taking? Authorizing Provider  aspirin 81 MG chewable tablet Chew 1 tablet (81 mg total) by mouth daily. 09/28/16  Yes Dunn, Areta Haber, PA-C  atorvastatin (LIPITOR) 10 MG tablet Take 1 tablet (10 mg total) by mouth daily. 12/02/16 07/12/24 Yes Belva Crome, MD  BRILINTA 90 MG TABS tablet TAKE 1 TABLET BY MOUTH TWICE A DAY 10/25/16  Yes Dunn, Dayna N, PA-C  dicyclomine (BENTYL) 10 MG capsule TAKE 1 CAPSULE BY MOUTH EVERY 8 HOURS AS NEEDED FOR  SPASMS. FOR ABDOMINAL DISCOMFORT 06/20/17  Yes Nandigam, Venia Minks, MD  FLUoxetine (PROZAC) 10 MG capsule TAKE 1 CAPSULE BY MOUTH EVERY DAY Patient taking differently: TAKE 1 CAPSULE BY MOUTH EVERY other DAY 07/22/17  Yes Timmothy Euler, [place] D, PA-C  LORazepam (ATIVAN) 0.5 MG tablet Take 1 tablet (0.5 mg total) by mouth every 8 (eight) hours as needed for anxiety. Patient taking differently: Take 0.5 mg by mouth every 8 (eight) hours as needed for anxiety (nausea).  08/04/17  Yes Heath Lark, MD  magic mouthwash w/lidocaine SOLN Take 5 mLs by mouth 4 (four) times daily. Swish and spit. Patient taking differently: Take 5 mLs by mouth 4 (four) times daily as needed for mouth pain. Swish and spit. 07/24/17  Yes Gorsuch, Ni, MD  ondansetron (ZOFRAN) 8 MG tablet Take 1 tablet (8 mg total) by mouth every 8 (eight) hours as needed for refractory nausea / vomiting. Start on day 3 after chemotherapy. 07/16/17  Yes Heath Lark, MD  prochlorperazine (COMPAZINE) 10 MG tablet Take 1 tablet (10 mg total) by mouth every 6 (six) hours as needed (Nausea or vomiting). 07/16/17  Yes Heath Lark, MD  traZODone (DESYREL) 50 MG tablet Take 0.5-1 tablets (25-50 mg total) at bedtime as needed by mouth for sleep. Patient taking differently: Take 25 mg by mouth at bedtime.  03/06/17  Yes Timmothy Euler, [place] D, PA-C  glycerin adult 2 g suppository Place 1 suppository rectally as needed for constipation. Patient not taking: Reported on 08/13/2017 07/27/17   Tanna Furry, MD  glycerin adult 2 g suppository Place 1 suppository rectally as needed for constipation. Patient not taking: Reported on 08/13/2017 07/27/17   Tanna Furry, MD  HYDROcodone-acetaminophen [facility]) 5-325 MG tablet Take 1 tablet by mouth every 4 (four) hours as needed for moderate pain. 08/13/17   Tanner, Lyndon Code., PA-C  nitroGLYCERIN (NITROSTAT) 0.4 MG SL tablet Place 1 tablet (0.4 mg total) under the tongue every 5 (five) minutes x 3 doses as needed for chest pain. 09/28/16   Rise Mu, PA-C    Physical Exam: Vitals:   08/13/17 1800 08/13/17 1830 08/13/17 2044 08/13/17 2130  BP: 131/69 (!) 112/58 100/77 110/65  Pulse: 98 86 86 95  Resp: 17 14 17  (!) 21  Temp:      TempSrc:      SpO2: 99% 98% 99% 97%      Constitutional: Moderately built and nourished. Vitals:   08/13/17 1800 08/13/17 1830 08/13/17 2044 08/13/17 2130  BP: 131/69 (!) 112/58 100/77 110/65  Pulse: 98 86 86 95  Resp: 17 14 17  (!) 21  Temp:      TempSrc:      SpO2: 99% 98% 99% 97%   Eyes: Anicteric no pallor. ENMT: No discharge from the ears eyes nose or more. Neck: No mass felt.  No JVD appreciated. Respiratory: No rhonchi or crepitations. Cardiovascular: S1-S2 heard no  murmurs appreciated. Abdomen: Soft nontender bowel sounds not appreciated. Musculoskeletal: No edema.  No joint effusion. Skin: No rash.  Skin appears warm parents Neurologic: Alert awake oriented to time place and person.  Moves all extremities current Psychiatric: Appears normal.  Normal affect.   Labs on Admission: I have personally reviewed following labs and imaging studies  CBC: Recent Labs  Lab 08/13/17 1343  WBC 8.4  NEUTROABS 6.4  HGB 14.0  HCT 40.9  MCV 95.9  PLT 638   Basic Metabolic Panel: Recent Labs  Lab 08/13/17 1343  NA 137  K 3.6  CL 102  CO2 32*  GLUCOSE 99  BUN 28*  CREATININE 0.80  CALCIUM 9.1   GFR: Estimated Creatinine Clearance: 57.3 mL/min (by C-G formula based on SCr of 0.8 mg/dL). Liver Function Tests: Recent Labs  Lab 08/13/17 1343  AST 14  ALT 13  ALKPHOS 86  BILITOT 0.7  PROT 6.6  ALBUMIN 3.2*   Recent Labs  Lab 08/13/17 1728  LIPASE 39   No results for input(s): AMMONIA in the last 168 hours. Coagulation Profile: No results for input(s): INR, PROTIME in the last 168 hours. Cardiac Enzymes: No results for input(s): CKTOTAL, CKMB, CKMBINDEX, TROPONINI in the last 168 hours. BNP (last 3 results) No results for input(s): PROBNP in the last 8760  hours. HbA1C: No results for input(s): HGBA1C in the last 72 hours. CBG: No results for input(s): GLUCAP in the last 168 hours. Lipid Profile: No results for input(s): CHOL, HDL, LDLCALC, TRIG, CHOLHDL, LDLDIRECT in the last 72 hours. Thyroid Function Tests: No results for input(s): TSH, T4TOTAL, FREET4, T3FREE, THYROIDAB in the last 72 hours. Anemia Panel: No results for input(s): VITAMINB12, FOLATE, FERRITIN, TIBC, IRON, RETICCTPCT in the last 72 hours. Urine analysis:    Component Value Date/Time   COLORURINE YELLOW 08/13/2017 1906   APPEARANCEUR CLEAR 08/13/2017 1906   LABSPEC 1.026 08/13/2017 1906   PHURINE 5.0 08/13/2017 1906   GLUCOSEU NEGATIVE 08/13/2017 1906   HGBUR MODERATE (A) 08/13/2017 1906   BILIRUBINUR NEGATIVE 08/13/2017 1906   BILIRUBINUR small (A) 12/03/2016 0917   KETONESUR 20 (A) 08/13/2017 1906   PROTEINUR NEGATIVE 08/13/2017 1906   UROBILINOGEN 0.2 12/03/2016 0917   UROBILINOGEN 0.2 08/06/2010 0246   NITRITE NEGATIVE 08/13/2017 1906   LEUKOCYTESUR NEGATIVE 08/13/2017 1906   Sepsis Labs: @LABRCNTIP (procalcitonin:4,lacticidven:4) )No results found for this or any previous visit (from the past 240 hour(s)).   Radiological Exams on Admission: Ct Abdomen Pelvis W Contrast  Result Date: 08/13/2017 CLINICAL DATA:  67 year old female with history of colon cancer presents with nausea and vomiting as well as progressive abdominal pain. Concern for small bowel obstruction. EXAM: CT ABDOMEN AND PELVIS WITH CONTRAST TECHNIQUE: Multidetector CT imaging of the abdomen and pelvis was performed using the standard protocol following bolus administration of intravenous contrast. CONTRAST:  46mL ISOVUE-300 IOPAMIDOL (ISOVUE-300) INJECTION 61%, 179mL ISOVUE-300 IOPAMIDOL (ISOVUE-300) INJECTION 61% COMPARISON:  CT from 07/18/2017 and 07/27/2017 FINDINGS: Lower chest: Normal size heart without pericardial effusion. Pulmonary mass or pulmonary consolidation. No effusion.  Hepatobiliary: Status post cholecystectomy. Mild intrahepatic ductal dilatation appears chronic and likely related to reservoir effect cholecystectomy. No choledocholithiasis. No focal liver lesion. Pancreas: No pancreatic mass or pathologic ductal dilatation. Spleen: Normal size spleen without mass. Adrenals/Urinary Tract: Stable mild nodular appearance of the left adrenal apex. Symmetric enhancement of both kidneys with water attenuating 13 mm cyst in the upper pole of the left kidney. No nephrolithiasis nor hydroureteronephrosis. Nondistended urinary bladder without focal mural  thickening, mass or calculus. Stomach/Bowel: The stomach is distended with enteric contrast. No intraluminal mass or focal mural thickening. There are fluid-filled dilated small bowel loops out of proportion to nondistended large bowel, caliber up to 2.7 cm and consistent with changes of high-grade small bowel obstruction. Suspect that the transition zone is within the pelvis given irregularly enhancing masslike opacities within the lower pelvis concerning for changes of possible peritoneal carcinomatosis and/or necrotic adenopathy. The colon is decompressed in appearance. Interval redevelopment of a small to moderate volume of ascites within the upper abdomen with redemonstration of omental edema and thickening. Vascular/Lymphatic: Nonaneurysmal atherosclerotic aorta and branch vessels. Reproductive: Mixed solid and cystic masslike abnormality in the left adnexa possibly of ovarian etiology similar in appearance to prior. Similar in appearance uterus and right adnexa. Other: Masslike abnormality in the right hemipelvis is redemonstrated, margins are ill-defined and difficult to delineate. This may be contributing to the patient's small-bowel obstruction either from adhesion or direct localized involvement of small bowel. Musculoskeletal: No aggressive osseous lesions. Degenerative changes along the dorsal spine. IMPRESSION: 1. The  findings within the abdomen and pelvis are similar to that of 07/12/2017. There has been redevelopment of a small to moderate volume of ascites, small bowel dilatation compatible with small bowel obstruction with probable zone of transition in the right hemipelvis secondary to pelvic mass like abnormality that may reflect peritoneal carcinomatosis for localized pelvic necrotic adenopathy. 2. Redemonstration of omental disease with induration and thickening. Electronically Signed   By: Ashley Royalty M.D.   On: 08/13/2017 20:19    EKG: Independently reviewed.  Normal sinus rhythm with nonspecific T wave changes.  Assessment/Plan Principal Problem:   SBO (small bowel obstruction) (HCC) Active Problems:   CAD (coronary artery disease), native coronary artery   Peritoneal carcinomatosis ([place])   Colon cancer ([place])    1. Small bowel obstruction with a history of peritoneal carcinomatosis -appreciate general surgery consult.  Patient is kept n.p.o. except medications and IV fluids and pain medications.  Repeat KUB in a.m. and further recommendations per surgery.  Plan is to manage conservatively at this time.  Since patient has had no further episodes of vomiting or nausea NG tube was not placed. 2. History of CAD status post stenting in June 2018 -patient is on Brilinta aspirin and Lipitor. 3. Peritoneal carcinomatosis being followed by oncologist.   DVT prophylaxis: SCDs. Code Status: Full code. Family Communication: Discussed with patient. Disposition Plan: Home. Consults called: General surgery. Admission status: Inpatient.   Rise Patience MD [facility] Pager [phone].  If 7PM-7AM, please contact night-coverage [email or web address] Password [facility]  08/13/2017, 10:53 PM

## 2017-08-13 NOTE — ED Triage Notes (Addendum)
Patient arrived from the cancer center in a wheelchair. Hx. Of colon cancer. Initial patient had a bowel obstruction prior to chemo. This weekend, Sunday. Patient started having n/v and progressive abdominal pain. Patient was given 2 mg of morphine IV and Zofran and abdominal pain continued to progress. Morphine is starting to work and pain has eased off. Patient here to make sure no bowel obstruction. Patient has a picc line right. CBC and CMET done at cancer center.

## 2017-08-13 NOTE — Patient Instructions (Signed)
Senna-S, 1 to 2 twice daily as needed for constipation.  Miralax, 17 grams in 8 ounces of liquids 1 to 3 times daily.

## 2017-08-13 NOTE — ED Provider Notes (Signed)
[facility] DEPT Provider Note   CSN: [id number] Arrival date & time: 08/13/17  1605     History   Chief Complaint Chief Complaint  Patient presents with  . Abdominal Pain    HPI Carol Ferguson is a 67 y.o. female.  The history is provided by the patient, a relative and medical records.  Abdominal Pain   This is a new problem. The current episode started more than 2 days ago. The problem occurs constantly. The problem has not changed since onset.The pain is associated with an unknown factor. The pain is located in the generalized abdominal region. The quality of the pain is aching, cramping and pressure-like. The pain is at a severity of 7/10. Associated symptoms include nausea and vomiting. Pertinent negatives include fever, diarrhea, flatus, constipation, dysuria and frequency. The symptoms are aggravated by palpation and certain positions. Nothing relieves the symptoms. Past medical history comments: abd cancer.    Past Medical History:  Diagnosis Date  . Anxiety   . 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 CAD (coronary artery disease)    a. Inf STEMI/LHC 09/2016 which showed occluded mid LCx treated with PCI, DES; remaining cath details included a proximal to mid LAD lesion of 20% stenosis, and proximal to mid RCA 25% stenosis. EF 55-60%.  . Depression   . Diverticulosis   . Gallstones   . Ischemic cardiomyopathy    a. 2D Echo 09/28/16 showed EF 55-60%, probable severe hypokinesis of the entire inferiormyocardium, normal diastolic parameters.  . Myocardial infarction ([place]) 09/26/2016  . Pneumonia   . Presence of tooth-root and mandibular implants   . Tobacco abuse   . Wears glasses     Patient Active Problem List   Diagnosis Date Noted  . Other constipation 08/04/2017  . 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 Mild protein-calorie malnutrition ([place]) 08/04/2017  . Anxiety 07/16/2017  . Chronic nausea 07/16/2017    . Weight loss 07/16/2017  . Colon cancer ([place]) 07/15/2017  . Goals of care, counseling/discussion 07/15/2017  . SBO (small bowel obstruction) ([place]) 07/12/2017  . Left tubo-ovarian mass 07/02/2017  . Peritoneal carcinomatosis ([place]) 07/02/2017  . Diverticulosis 01/10/2017  . Cervical mass 12/27/2016  . CAD (coronary artery disease), native coronary artery 11/27/2016  . Chronic diastolic heart failure ([place]) 09/27/2016  . Hyperlipidemia with target LDL less than 70 09/27/2016  . 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 Lumbar degenerative disc disease 06/14/2016    Past Surgical History:  Procedure Laterality Date  . CERVICAL CONIZATION W/BX N/A 01/28/2017   Procedure: CONIZATION CERVIX WITH BIOPSY;  Surgeon: [name], MD;  Location: [facility];  Service: Gynecology;  Laterality: N/A;  . CHOLECYSTECTOMY    . CORONARY STENT INTERVENTION N/A 09/26/2016   Procedure: Coronary Stent Intervention;  Surgeon: Belva Crome, MD;  Location: [facility];  Service: Cardiovascular;  Laterality: N/A;  . DILATION AND CURETTAGE OF UTERUS N/A 01/28/2017   Procedure: DILATATION AND CURETTAGE WITH ULTRASOUND GUIDENCE;  Surgeon: [name], MD;  Location: [facility];  Service: Gynecology;  Laterality: N/A;  . LEFT HEART CATH AND CORONARY ANGIOGRAPHY N/A 09/26/2016   Procedure: Left Heart Cath and Coronary Angiography;  Surgeon: Belva Crome, MD;  Location: [facility];  Service: Cardiovascular;  Laterality: N/A;     OB History    Gravida  0   Para  0   Term  0   Preterm  0   AB  0   Living  0     SAB  0   TAB  0   Ectopic  0   Multiple  0   Live Births  0            Home Medications    Prior to Admission medications   Medication Sig Start Date End Date Taking? Authorizing Provider  aspirin 81 MG chewable tablet Chew 1 tablet (81 mg total) by mouth daily. 09/28/16   Dunn, Areta Haber, PA-C  atorvastatin (LIPITOR) 10  MG tablet Take 1 tablet (10 mg total) by mouth daily. 12/02/16 07/12/24  Belva Crome, MD  BRILINTA 90 MG TABS tablet TAKE 1 TABLET BY MOUTH TWICE A DAY 10/25/16   Dunn, Dayna N, PA-C  dicyclomine (BENTYL) 10 MG capsule TAKE 1 CAPSULE BY MOUTH EVERY 8 HOURS AS NEEDED FOR SPASMS. FOR ABDOMINAL DISCOMFORT 06/20/17   Mauri Pole, MD  FLUoxetine (PROZAC) 10 MG capsule TAKE 1 CAPSULE BY MOUTH EVERY DAY Patient taking differently: TAKE 1 CAPSULE BY MOUTH EVERY other DAY 07/22/17   Tenna Delaine D, PA-C  glycerin adult 2 g suppository Place 1 suppository rectally as needed for constipation. 07/27/17   Tanna Furry, MD  glycerin adult 2 g suppository Place 1 suppository rectally as needed for constipation. 07/27/17   Tanna Furry, MD  HYDROcodone-acetaminophen (NORCO) 5-325 MG tablet Take 1 tablet by mouth every 4 (four) hours as needed for moderate pain. 08/13/17   Tanner, Lyndon Code., PA-C  LORazepam (ATIVAN) 0.5 MG tablet Take 1 tablet (0.5 mg total) by mouth every 8 (eight) hours as needed for anxiety. 08/04/17   Heath Lark, MD  magic mouthwash w/lidocaine SOLN Take 5 mLs by mouth 4 (four) times daily. Swish and spit. 07/24/17   Heath Lark, MD  nitroGLYCERIN (NITROSTAT) 0.4 MG SL tablet Place 1 tablet (0.4 mg total) under the tongue every 5 (five) minutes x 3 doses as needed for chest pain. Patient not taking: Reported on 08/13/2017 09/28/16   Rise Mu, PA-C  ondansetron (ZOFRAN) 8 MG tablet Take 1 tablet (8 mg total) by mouth every 8 (eight) hours as needed for refractory nausea / vomiting. Start on day 3 after chemotherapy. 07/16/17   Heath Lark, MD  prochlorperazine (COMPAZINE) 10 MG tablet Take 1 tablet (10 mg total) by mouth every 6 (six) hours as needed (Nausea or vomiting). 07/16/17   Heath Lark, MD  traZODone (DESYREL) 50 MG tablet Take 0.5-1 tablets (25-50 mg total) at bedtime as needed by mouth for sleep. 03/06/17   [name], PA-C    Family History Family History  Problem Relation Age  of Onset  . Cancer Brother        MELANOMA  . Heart disease Father   . Stroke Father   . Heart disease Brother   . Diabetes Brother   . Heart attack Brother   . Heart failure Brother     Social History Social History   Tobacco Use  . Smoking status: Former Smoker    Packs/day: 1.00    Years: 35.00    Pack years: 35.00    Types: Cigarettes    Last attempt to quit: 09/26/2016    Years since quitting: 0.8  . Smokeless tobacco: Never Used  Substance Use Topics  . Alcohol use: Yes    Comment: occasional  . Drug use: No     Allergies   Flagyl [metronidazole]  Review of Systems Review of Systems  Constitutional: Negative for chills, diaphoresis, fatigue and fever.  HENT: Negative for congestion.   Respiratory: Negative for cough, chest tightness, shortness of breath and wheezing.   Cardiovascular: Negative for chest pain and palpitations.  Gastrointestinal: Positive for abdominal pain, nausea and vomiting. Negative for abdominal distention, constipation, diarrhea and flatus.  Genitourinary: Positive for decreased urine volume. Negative for dysuria, flank pain and frequency.  Musculoskeletal: Negative for back pain, neck pain and neck stiffness.  Skin: Negative for rash and wound.  Neurological: Negative for light-headedness.  Psychiatric/Behavioral: Negative for agitation.  All other systems reviewed and are negative.    Physical Exam Updated Vital Signs BP 110/67 (BP Location: Left Arm)   Pulse 83   Temp 98 F (36.7 C) (Oral)   Resp 17   SpO2 98%   Physical Exam  Constitutional: She is oriented to person, place, and time. She appears well-developed and well-nourished.  Non-toxic appearance. She does not appear ill. No distress.  HENT:  Head: Normocephalic and atraumatic.  Mouth/Throat: Oropharynx is clear and moist. No oropharyngeal exudate.  Eyes: Pupils are equal, round, and reactive to light. Conjunctivae and EOM are normal. No scleral icterus.  Neck:  Normal range of motion.  Cardiovascular: Normal rate and intact distal pulses.  No murmur heard. Pulmonary/Chest: Effort normal. No respiratory distress. She has no wheezes. She has no rales. She exhibits no tenderness.  Abdominal: Soft. Normal appearance and bowel sounds are normal. There is generalized tenderness. There is no rigidity, no rebound and no CVA tenderness.  Musculoskeletal: She exhibits no edema or tenderness.  Neurological: She is alert and oriented to person, place, and time. No sensory deficit. She exhibits normal muscle tone.  Skin: Capillary refill takes less than 2 seconds. No rash noted. She is not diaphoretic. No erythema. No pallor.  Psychiatric: She has a normal mood and affect.  Nursing note and vitals reviewed.    ED Treatments / Results  Labs (all labs ordered are listed, but only abnormal results are displayed) Labs Reviewed  URINALYSIS, ROUTINE W REFLEX MICROSCOPIC - Abnormal; Notable for the following components:      Result Value   Hgb urine dipstick MODERATE (*)    Ketones, ur 20 (*)    All other components within normal limits  URINE CULTURE  LIPASE, BLOOD  BASIC METABOLIC PANEL  CBC  I-STAT CG4 LACTIC ACID, ED    EKG None  Radiology Ct Abdomen Pelvis W Contrast  Result Date: 08/13/2017 CLINICAL DATA:  67 year old female with history of colon cancer presents with nausea and vomiting as well as progressive abdominal pain. Concern for small bowel obstruction. EXAM: CT ABDOMEN AND PELVIS WITH CONTRAST TECHNIQUE: Multidetector CT imaging of the abdomen and pelvis was performed using the standard protocol following bolus administration of intravenous contrast. CONTRAST:  44mL ISOVUE-300 IOPAMIDOL (ISOVUE-300) INJECTION 61%, 165mL ISOVUE-300 IOPAMIDOL (ISOVUE-300) INJECTION 61% COMPARISON:  CT from 07/18/2017 and 07/27/2017 FINDINGS: Lower chest: Normal size heart without pericardial effusion. Pulmonary mass or pulmonary consolidation. No effusion.  Hepatobiliary: Status post cholecystectomy. Mild intrahepatic ductal dilatation appears chronic and likely related to reservoir effect cholecystectomy. No choledocholithiasis. No focal liver lesion. Pancreas: No pancreatic mass or pathologic ductal dilatation. Spleen: Normal size spleen without mass. Adrenals/Urinary Tract: Stable mild nodular appearance of the left adrenal apex. Symmetric enhancement of both kidneys with water attenuating 13 mm cyst in the upper pole of the left kidney. No nephrolithiasis nor hydroureteronephrosis. Nondistended urinary bladder without focal mural thickening,  mass or calculus. Stomach/Bowel: The stomach is distended with enteric contrast. No intraluminal mass or focal mural thickening. There are fluid-filled dilated small bowel loops out of proportion to nondistended large bowel, caliber up to 2.7 cm and consistent with changes of high-grade small bowel obstruction. Suspect that the transition zone is within the pelvis given irregularly enhancing masslike opacities within the lower pelvis concerning for changes of possible peritoneal carcinomatosis and/or necrotic adenopathy. The colon is decompressed in appearance. Interval redevelopment of a small to moderate volume of ascites within the upper abdomen with redemonstration of omental edema and thickening. Vascular/Lymphatic: Nonaneurysmal atherosclerotic aorta and branch vessels. Reproductive: Mixed solid and cystic masslike abnormality in the left adnexa possibly of ovarian etiology similar in appearance to prior. Similar in appearance uterus and right adnexa. Other: Masslike abnormality in the right hemipelvis is redemonstrated, margins are ill-defined and difficult to delineate. This may be contributing to the patient's small-bowel obstruction either from adhesion or direct localized involvement of small bowel. Musculoskeletal: No aggressive osseous lesions. Degenerative changes along the dorsal spine. IMPRESSION: 1. The  findings within the abdomen and pelvis are similar to that of 07/12/2017. There has been redevelopment of a small to moderate volume of ascites, small bowel dilatation compatible with small bowel obstruction with probable zone of transition in the right hemipelvis secondary to pelvic mass like abnormality that may reflect peritoneal carcinomatosis for localized pelvic necrotic adenopathy. 2. Redemonstration of omental disease with induration and thickening. Electronically Signed   By: Ashley Royalty M.D.   On: 08/13/2017 20:19    Procedures Procedures (including critical care time)  Medications Ordered in ED Medications  iopamidol (ISOVUE-300) 61 % injection (has no administration in time range)  iopamidol (ISOVUE-300) 61 % injection (has no administration in time range)  sodium chloride 0.9 % injection (has no administration in time range)  0.9 %  sodium chloride infusion (has no administration in time range)  sodium chloride 0.9 % bolus 1,000 mL (0 mLs Intravenous Stopped 08/13/17 1906)  iopamidol (ISOVUE-300) 61 % injection 30 mL (30 mLs Oral Contrast Given 08/13/17 1943)  morphine 4 MG/ML injection 4 mg (4 mg Intravenous Given 08/13/17 1824)  iopamidol (ISOVUE-300) 61 % injection 100 mL (100 mLs Intravenous Contrast Given 08/13/17 1944)     Initial Impression / Assessment and Plan / ED Course  I have reviewed the triage vital signs and the nursing notes.  Pertinent labs & imaging results that were available during my care of the patient were reviewed by me and considered in my medical decision making (see chart for details).     Carol Ferguson is a 67 y.o. female with a past medical history significant for CAD status post PCI, prior cholecystectomy, prior diverticulosis, and current abdominal cancer on chemotherapy with prior bowel obstruction who presents from the cancer center for bowel stricture rule out.  Patient reports that for the last 5 days she has had persistent nausea and several  episodes of vomiting.  She reports that she is having abdominal pain that she describes as a 7 out of 10 pain that comes and goes every 5 minutes.  She reports that it feels slightly different than her prior bowel obstruction but it is a severe pain.  She reports that she had decreased oral intake due to the severe nausea.  She reports a decreased urination but denies any hematuria.  She reports that she did have a normal bowel movement yesterday but has had very minimal passing of flatus.  She  denies any recent trauma.  She denies fevers, chills, or URI symptoms.  No chest pain palpitations or shortness of breath.  On exam, abdomen is exquisitely tender in all fields.  Back is nontender.  Lungs are clear.  Chest is nontender.  Based on patient's report of severe abdominal pain, nausea, vomiting, decreased flatus, and history of bowel obstruction, patient will have CT imaging to rule out obstruction recurrence.  Patient will be given fluids and will have labs and urinalysis as well.     5:09 PM CBC and CMP from earlier today was reassuring.  Creatinine was 0.8.  Do not feel she needs this repeated as it was done in the last few hours.  Patient will have contrast with her CT scan.   9:20 PM CT scan revealed evidence of small bowel obstruction.  General surgery was called who recommended admission to medicine service for bowel rest and further management.  They will see the patient to make of the recommendation's.   Hospitalist team will be called for admission.   Final Clinical Impressions(s) / ED Diagnoses   Final diagnoses:  SBO (small bowel obstruction) [facility])    ED Discharge Orders    None      Clinical Impression: 1. SBO (small bowel obstruction) ([place])     Disposition: Admit  This note was prepared with assistance of Dragon voice recognition software. Occasional wrong-word or sound-a-like substitutions may have occurred due to the inherent limitations of voice recognition  software.     Elicia Lui, Gwenyth Allegra, MD 08/14/17 [phone]

## 2017-08-13 NOTE — Consult Note (Signed)
Reason for Consult: abdominal pain   Carol Ferguson is an 67 y.o. female.  HPI: 67 yo female with advanced cancer with carcinomatosis. Biopsys consistent with GI origin though initially there was thought it was a uterine origin. She presents 1 week after chemo with fatigue and abdominal pain. Over the last week she has had difficulty keeping food down. She was feeling more fatigued. 3 days ago she had multiple bouts of vomiting. Her last bowel movement was 1-2 days ago. Currently she does not feel nauseated and hasn't vomited all day. She is weight stable over the last month, but has lost a total of 40lb.  Past Medical History:  Diagnosis Date  . Anxiety   . 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 CAD (coronary artery disease)    a. Inf STEMI/LHC 09/2016 which showed occluded mid LCx treated with PCI, DES; remaining cath details included a proximal to mid LAD lesion of 20% stenosis, and proximal to mid RCA 25% stenosis. EF 55-60%.  . Depression   . Diverticulosis   . Gallstones   . Ischemic cardiomyopathy    a. 2D Echo 09/28/16 showed EF 55-60%, probable severe hypokinesis of the entire inferiormyocardium, normal diastolic parameters.  . Myocardial infarction ([place]) 09/26/2016  . Pneumonia   . Presence of tooth-root and mandibular implants   . Tobacco abuse   . Wears glasses     Past Surgical History:  Procedure Laterality Date  . CERVICAL CONIZATION W/BX N/A 01/28/2017   Procedure: CONIZATION CERVIX WITH BIOPSY;  Surgeon: [name], MD;  Location: [facility];  Service: Gynecology;  Laterality: N/A;  . CHOLECYSTECTOMY    . CORONARY STENT INTERVENTION N/A 09/26/2016   Procedure: Coronary Stent Intervention;  Surgeon: Belva Crome, MD;  Location: [place] [facility];  Service: Cardiovascular;  Laterality: N/A;  . DILATION AND CURETTAGE OF UTERUS N/A 01/28/2017   Procedure: DILATATION AND CURETTAGE WITH ULTRASOUND GUIDENCE;  Surgeon: [name], MD;  Location: [place];  Service: Gynecology;  Laterality: N/A;  . LEFT HEART CATH AND CORONARY ANGIOGRAPHY N/A 09/26/2016   Procedure: Left Heart Cath and Coronary Angiography;  Surgeon: Belva Crome, MD;  Location: [facility] [facility];  Service: Cardiovascular;  Laterality: N/A;    Family History  Problem Relation Age of Onset  . Cancer Brother        MELANOMA  . Heart disease Father   . Stroke Father   . Heart disease Brother   . Diabetes Brother   . Heart attack Brother   . Heart failure Brother     Social History:  reports that she quit smoking about 10 months ago. Her smoking use included cigarettes. She has a 35.00 pack-year smoking history. She has never used smokeless tobacco. She reports that she drinks alcohol. She reports that she does not use drugs.  Allergies:  Allergies  Allergen Reactions  . Flagyl [Metronidazole] Nausea Only    Medications: I have reviewed the patient's current medications.  Results for orders placed or performed during the hospital encounter of 08/13/17 (from the past 48 hour(s))  Lipase, blood     Status: None   Collection Time: 08/13/17  5:28 PM  Result Value Ref Range   Lipase 39 11 - 51 U/L    Comment: Performed at [facility], [place] [address], [place], [place] [address]  I-Stat CG4 Lactic Acid, ED     Status: None   Collection  Time: 08/13/17  5:39 PM  Result Value Ref Range   Lactic Acid, Venous 0.71 0.5 - 1.9 mmol/L  Urinalysis, Routine w reflex microscopic     Status: Abnormal   Collection Time: 08/13/17  7:06 PM  Result Value Ref Range   Color, Urine YELLOW YELLOW   APPearance CLEAR CLEAR   Specific Gravity, Urine 1.026 1.005 - 1.030   pH 5.0 5.0 - 8.0   Glucose, UA NEGATIVE NEGATIVE mg/dL   Hgb urine dipstick MODERATE (A) NEGATIVE   Bilirubin Urine NEGATIVE NEGATIVE   Ketones, ur 20 (A) NEGATIVE mg/dL   Protein, ur NEGATIVE NEGATIVE mg/dL   Nitrite NEGATIVE NEGATIVE   Leukocytes, UA NEGATIVE NEGATIVE   RBC /  HPF 0-5 0 - 5 RBC/hpf   WBC, UA 0-5 0 - 5 WBC/hpf   Bacteria, UA NONE SEEN NONE SEEN   Squamous Epithelial / LPF 0-5 0 - 5    Comment: Please note change in reference range.   Mucus PRESENT     Comment: Performed at [facility], [place] [address], [place], [place] [address]    Ct Abdomen Pelvis W Contrast  Result Date: 08/13/2017 CLINICAL DATA:  66 year old female with history of colon cancer presents with nausea and vomiting as well as progressive abdominal pain. Concern for small bowel obstruction. EXAM: CT ABDOMEN AND PELVIS WITH CONTRAST TECHNIQUE: Multidetector CT imaging of the abdomen and pelvis was performed using the standard protocol following bolus administration of intravenous contrast. CONTRAST:  14mL ISOVUE-300 IOPAMIDOL (ISOVUE-300) INJECTION 61%, 12mL ISOVUE-300 IOPAMIDOL (ISOVUE-300) INJECTION 61% COMPARISON:  CT from 07/18/2017 and 07/27/2017 FINDINGS: Lower chest: Normal size heart without pericardial effusion. Pulmonary mass or pulmonary consolidation. No effusion. Hepatobiliary: Status post cholecystectomy. Mild intrahepatic ductal dilatation appears chronic and likely related to reservoir effect cholecystectomy. No choledocholithiasis. No focal liver lesion. Pancreas: No pancreatic mass or pathologic ductal dilatation. Spleen: Normal size spleen without mass. Adrenals/Urinary Tract: Stable mild nodular appearance of the left adrenal apex. Symmetric enhancement of both kidneys with water attenuating 13 mm cyst in the upper pole of the left kidney. No nephrolithiasis nor hydroureteronephrosis. Nondistended urinary bladder without focal mural thickening, mass or calculus. Stomach/Bowel: The stomach is distended with enteric contrast. No intraluminal mass or focal mural thickening. There are fluid-filled dilated small bowel loops out of proportion to nondistended large bowel, caliber up to 2.7 cm and consistent with changes of high-grade small bowel obstruction. Suspect  that the transition zone is within the pelvis given irregularly enhancing masslike opacities within the lower pelvis concerning for changes of possible peritoneal carcinomatosis and/or necrotic adenopathy. The colon is decompressed in appearance. Interval redevelopment of a small to moderate volume of ascites within the upper abdomen with redemonstration of omental edema and thickening. Vascular/Lymphatic: Nonaneurysmal atherosclerotic aorta and branch vessels. Reproductive: Mixed solid and cystic masslike abnormality in the left adnexa possibly of ovarian etiology similar in appearance to prior. Similar in appearance uterus and right adnexa. Other: Masslike abnormality in the right hemipelvis is redemonstrated, margins are ill-defined and difficult to delineate. This may be contributing to the patient's small-bowel obstruction either from adhesion or direct localized involvement of small bowel. Musculoskeletal: No aggressive osseous lesions. Degenerative changes along the dorsal spine. IMPRESSION: 1. The findings within the abdomen and pelvis are similar to that of 07/12/2017. There has been redevelopment of a small to moderate volume of ascites, small bowel dilatation compatible with small bowel obstruction with probable zone of transition in the right hemipelvis secondary to pelvic mass  like abnormality that may reflect peritoneal carcinomatosis for localized pelvic necrotic adenopathy. 2. Redemonstration of omental disease with induration and thickening. Electronically Signed   By: Ashley Royalty M.D.   On: 08/13/2017 20:19    Review of Systems  Constitutional: Positive for malaise/fatigue and weight loss. Negative for chills and fever.  HENT: Negative for hearing loss.   Eyes: Negative for blurred vision and double vision.  Respiratory: Negative for cough and hemoptysis.   Cardiovascular: Negative for chest pain and palpitations.  Gastrointestinal: Positive for abdominal pain. Negative for nausea and  vomiting.  Genitourinary: Negative for dysuria and urgency.  Musculoskeletal: Negative for myalgias and neck pain.  Skin: Negative for itching and rash.  Neurological: Negative for dizziness, tingling and headaches.  Endo/Heme/Allergies: Does not bruise/bleed easily.  Psychiatric/Behavioral: Negative for depression and suicidal ideas.   Blood pressure 100/77, pulse 86, temperature 98 F (36.7 C), temperature source Oral, resp. rate 17, SpO2 99 %. Physical Exam  Vitals reviewed. Constitutional: She is oriented to person, place, and time. She appears well-developed and well-nourished.  HENT:  Head: Normocephalic and atraumatic.  Eyes: Pupils are equal, round, and reactive to light. Conjunctivae and EOM are normal.  Neck: Normal range of motion. Neck supple.  Cardiovascular: Normal rate and regular rhythm.  Respiratory: Effort normal and breath sounds normal.  GI: Soft. Bowel sounds are normal. She exhibits distension. There is tenderness.  Diffuse tenderness without guarding or rebound  Musculoskeletal: Normal range of motion.  Neurological: She is alert and oriented to person, place, and time.  Skin: Skin is warm and dry.  Psychiatric: She has a normal mood and affect. Her behavior is normal.    Assessment/Plan: 67 yo female with abdominal cancer and concern for carcinomatosis presents with fatigue and abdominal pain and imaging is concerning for high grade bowel obstruction. -Given that she has not vomited all day, I agree with the plan to not undergo NG tube placement at this time. -I am concerned that this issues will continue to recur and do not think there is an operative solution to it   Arta Bruce Kinsinger 08/13/2017, 9:46 PM

## 2017-08-13 NOTE — ED Notes (Signed)
ED TO INPATIENT HANDOFF REPORT  Name/Age/Gender Carol Ferguson 67 y.o. female  Code Status Code Status History    Date Active Date Inactive Code Status Order ID Comments User Context   07/12/2017 1003 07/14/2017 2015 Full Code [id number]  Elwin Mocha, MD Inpatient   09/26/2016 1147 09/28/2016 1438 Full Code [id number]  Belva Crome, MD Inpatient   09/26/2016 1147 09/26/2016 1147 Full Code [id number]  Leanor Kail, PA Inpatient    Advance Directive Documentation     Most Recent Value  Type of Advance Directive  Healthcare Power of Attorney, Living will  Pre-existing out of facility DNR order (yellow form or pink MOST form)  -  "MOST" Form in Place?  -      Home/SNF/Other Home  Chief Complaint abd pain   Level of Care/Admitting Diagnosis ED Disposition    ED Disposition Condition [place]: [facility] [[id number]]  Level of Care: Telemetry [5]  Admit to tele based on following criteria: Monitor for Ischemic changes  Diagnosis: SBO (small bowel obstruction) [facility]) [[id number]]  Admitting Physician: Rise Patience [phone]  Attending Physician: Rise Patience [phone]  Estimated length of stay: past midnight tomorrow  Certification:: I certify this patient will need inpatient services for at least 2 midnights  PT Class (Do Not Modify): Inpatient [101]  PT Acc Code (Do Not Modify): Private [1]       Medical History Past Medical History:  Diagnosis Date  . Anxiety   . 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 CAD (coronary artery disease)    a. Inf STEMI/LHC 09/2016 which showed occluded mid LCx treated with PCI, DES; remaining cath details included a proximal to mid LAD lesion of 20% stenosis, and proximal to mid RCA 25% stenosis. EF 55-60%.  . Depression   . Diverticulosis   . Gallstones   . Ischemic cardiomyopathy    a. 2D Echo 09/28/16 showed EF 55-60%, probable severe hypokinesis of the entire inferiormyocardium, normal  diastolic parameters.  . Myocardial infarction ([place]) 09/26/2016  . Pneumonia   . Presence of tooth-root and mandibular implants   . Tobacco abuse   . Wears glasses     Allergies Allergies  Allergen Reactions  . Flagyl [Metronidazole] Nausea Only    IV Location/Drains/Wounds Patient Lines/Drains/Airways Status   Active Line/Drains/Airways    Name:   Placement date:   Placement time:   Site:   Days:   PICC Single Lumen 48/54/62 PICC Right Basilic   70/35/00    9381    Basilic   26   Wound / Incision (Open or Dehisced) 07/11/17 Puncture Abdomen Left puncture site from biopsy   07/11/17    1328    Abdomen   33          Labs/Imaging Results for orders placed or performed during the hospital encounter of 08/13/17 (from the past 48 hour(s))  Lipase, blood     Status: None   Collection Time: 08/13/17  5:28 PM  Result Value Ref Range   Lipase 39 11 - 51 U/L    Comment: Performed at [facility], [place] [address], [place], [place] [address]  I-Stat CG4 Lactic Acid, ED     Status: None   Collection Time: 08/13/17  5:39 PM  Result Value Ref Range   Lactic Acid, Venous 0.71 0.5 - 1.9 mmol/L  Urinalysis, Routine w reflex microscopic     Status: Abnormal  Collection Time: 08/13/17  7:06 PM  Result Value Ref Range   Color, Urine YELLOW YELLOW   APPearance CLEAR CLEAR   Specific Gravity, Urine 1.026 1.005 - 1.030   pH 5.0 5.0 - 8.0   Glucose, UA NEGATIVE NEGATIVE mg/dL   Hgb urine dipstick MODERATE (A) NEGATIVE   Bilirubin Urine NEGATIVE NEGATIVE   Ketones, ur 20 (A) NEGATIVE mg/dL   Protein, ur NEGATIVE NEGATIVE mg/dL   Nitrite NEGATIVE NEGATIVE   Leukocytes, UA NEGATIVE NEGATIVE   RBC / HPF 0-5 0 - 5 RBC/hpf   WBC, UA 0-5 0 - 5 WBC/hpf   Bacteria, UA NONE SEEN NONE SEEN   Squamous Epithelial / LPF 0-5 0 - 5    Comment: Please note change in reference range.   Mucus PRESENT     Comment: Performed at [facility], [place] [address],  [place], [place] [address]   Ct Abdomen Pelvis W Contrast  Result Date: 08/13/2017 CLINICAL DATA:  67 year old female with history of colon cancer presents with nausea and vomiting as well as progressive abdominal pain. Concern for small bowel obstruction. EXAM: CT ABDOMEN AND PELVIS WITH CONTRAST TECHNIQUE: Multidetector CT imaging of the abdomen and pelvis was performed using the standard protocol following bolus administration of intravenous contrast. CONTRAST:  50mL ISOVUE-300 IOPAMIDOL (ISOVUE-300) INJECTION 61%, 197mL ISOVUE-300 IOPAMIDOL (ISOVUE-300) INJECTION 61% COMPARISON:  CT from 07/18/2017 and 07/27/2017 FINDINGS: Lower chest: Normal size heart without pericardial effusion. Pulmonary mass or pulmonary consolidation. No effusion. Hepatobiliary: Status post cholecystectomy. Mild intrahepatic ductal dilatation appears chronic and likely related to reservoir effect cholecystectomy. No choledocholithiasis. No focal liver lesion. Pancreas: No pancreatic mass or pathologic ductal dilatation. Spleen: Normal size spleen without mass. Adrenals/Urinary Tract: Stable mild nodular appearance of the left adrenal apex. Symmetric enhancement of both kidneys with water attenuating 13 mm cyst in the upper pole of the left kidney. No nephrolithiasis nor hydroureteronephrosis. Nondistended urinary bladder without focal mural thickening, mass or calculus. Stomach/Bowel: The stomach is distended with enteric contrast. No intraluminal mass or focal mural thickening. There are fluid-filled dilated small bowel loops out of proportion to nondistended large bowel, caliber up to 2.7 cm and consistent with changes of high-grade small bowel obstruction. Suspect that the transition zone is within the pelvis given irregularly enhancing masslike opacities within the lower pelvis concerning for changes of possible peritoneal carcinomatosis and/or necrotic adenopathy. The colon is decompressed in appearance. Interval redevelopment of a  small to moderate volume of ascites within the upper abdomen with redemonstration of omental edema and thickening. Vascular/Lymphatic: Nonaneurysmal atherosclerotic aorta and branch vessels. Reproductive: Mixed solid and cystic masslike abnormality in the left adnexa possibly of ovarian etiology similar in appearance to prior. Similar in appearance uterus and right adnexa. Other: Masslike abnormality in the right hemipelvis is redemonstrated, margins are ill-defined and difficult to delineate. This may be contributing to the patient's small-bowel obstruction either from adhesion or direct localized involvement of small bowel. Musculoskeletal: No aggressive osseous lesions. Degenerative changes along the dorsal spine. IMPRESSION: 1. The findings within the abdomen and pelvis are similar to that of 07/12/2017. There has been redevelopment of a small to moderate volume of ascites, small bowel dilatation compatible with small bowel obstruction with probable zone of transition in the right hemipelvis secondary to pelvic mass like abnormality that may reflect peritoneal carcinomatosis for localized pelvic necrotic adenopathy. 2. Redemonstration of omental disease with induration and thickening. Electronically Signed   By: Ashley Royalty M.D.   On: 08/13/2017 20:19  Pending Labs Unresulted Labs (From admission, onward)   Start     Ordered   08/13/17 1701  Urine culture  STAT,   STAT     08/13/17 1701      Vitals/Pain Today's Vitals   08/13/17 1800 08/13/17 1830 08/13/17 1905 08/13/17 2044  BP: 131/69 (!) 112/58  100/77  Ferguson: 98 86  86  Resp: 17 14  17   Temp:      TempSrc:      SpO2: 99% 98%  99%  PainSc:   3      Isolation Precautions No active isolations  Medications Medications  iopamidol (ISOVUE-300) 61 % injection (has no administration in time range)  iopamidol (ISOVUE-300) 61 % injection (has no administration in time range)  sodium chloride 0.9 % injection (has no administration in  time range)  dextrose 5 %-0.9 % sodium chloride infusion (has no administration in time range)  sodium chloride 0.9 % bolus 1,000 mL (0 mLs Intravenous Stopped 08/13/17 1906)  iopamidol (ISOVUE-300) 61 % injection 30 mL (30 mLs Oral Contrast Given 08/13/17 1943)  morphine 4 MG/ML injection 4 mg (4 mg Intravenous Given 08/13/17 1824)  iopamidol (ISOVUE-300) 61 % injection 100 mL (100 mLs Intravenous Contrast Given 08/13/17 1944)  morphine 4 MG/ML injection 4 mg (4 mg Intravenous Given 08/13/17 2139)    Mobility walks

## 2017-08-13 NOTE — ED Notes (Signed)
No respiratory or acute distress noted alert and oriented x 3 visitors at bedside call light in reach no pain voiced.

## 2017-08-14 ENCOUNTER — Inpatient Hospital Stay (HOSPITAL_COMMUNITY): Payer: Medicare Other

## 2017-08-14 DIAGNOSIS — C786 Secondary malignant neoplasm of retroperitoneum and peritoneum: Principal | ICD-10-CM

## 2017-08-14 DIAGNOSIS — Z808 Family history of malignant neoplasm of other organs or systems: Secondary | ICD-10-CM

## 2017-08-14 DIAGNOSIS — K56609 Unspecified intestinal obstruction, unspecified as to partial versus complete obstruction: Secondary | ICD-10-CM

## 2017-08-14 DIAGNOSIS — I25118 Atherosclerotic heart disease of native coronary artery with other forms of angina pectoris: Secondary | ICD-10-CM

## 2017-08-14 DIAGNOSIS — Z87891 Personal history of nicotine dependence: Secondary | ICD-10-CM

## 2017-08-14 DIAGNOSIS — G893 Neoplasm related pain (acute) (chronic): Secondary | ICD-10-CM

## 2017-08-14 DIAGNOSIS — Z66 Do not resuscitate: Secondary | ICD-10-CM

## 2017-08-14 LAB — CBC
HEMATOCRIT: 37.2 % (ref 36.0–46.0)
Hemoglobin: 12.8 g/dL (ref 12.0–15.0)
MCH: 32.7 pg (ref 26.0–34.0)
MCHC: 34.4 g/dL (ref 30.0–36.0)
MCV: 95.1 fL (ref 78.0–100.0)
Platelets: 200 10*3/uL (ref 150–400)
RBC: 3.91 MIL/uL (ref 3.87–5.11)
RDW: 12.8 % (ref 11.5–15.5)
WBC: 6.1 10*3/uL (ref 4.0–10.5)

## 2017-08-14 LAB — BASIC METABOLIC PANEL
ANION GAP: 6 (ref 5–15)
BUN: 25 mg/dL — AB (ref 6–20)
CHLORIDE: 104 mmol/L (ref 101–111)
CO2: 27 mmol/L (ref 22–32)
Calcium: 8.4 mg/dL — ABNORMAL LOW (ref 8.9–10.3)
Creatinine, Ser: 0.79 mg/dL (ref 0.44–1.00)
GFR calc Af Amer: >60 mL/min (ref >60)
GFR calc non Af Amer: >60 mL/min (ref >60)
Glucose, Bld: 115 mg/dL — ABNORMAL HIGH (ref 65–99)
POTASSIUM: 3.5 mmol/L (ref 3.5–5.1)
SODIUM: 137 mmol/L (ref 135–145)

## 2017-08-14 LAB — PHOSPHORUS: PHOSPHORUS: 3.5 mg/dL (ref 2.5–4.6)

## 2017-08-14 LAB — MAGNESIUM: Magnesium: 1.7 mg/dL (ref 1.7–2.4)

## 2017-08-14 LAB — GLUCOSE, CAPILLARY
GLUCOSE-CAPILLARY: 113 mg/dL — AB (ref 65–99)
GLUCOSE-CAPILLARY: 105 mg/dL — AB (ref 65–99)
Glucose-Capillary: 97 mg/dL (ref 65–99)

## 2017-08-14 MED ORDER — MORPHINE SULFATE (PF) 4 MG/ML IV SOLN
2.0000 mg | INTRAVENOUS | Status: DC | PRN
  Administered 2017-08-14 – 2017-08-17 (×23): 2 mg via INTRAVENOUS
  Filled 2017-08-14 (×24): qty 1

## 2017-08-14 MED ORDER — SODIUM CHLORIDE 0.9% FLUSH
10.0000 mL | INTRAVENOUS | Status: DC | PRN
  Administered 2017-08-16: 20 mL
  Administered 2017-08-21: 10 mL
  Filled 2017-08-14 (×2): qty 40

## 2017-08-14 MED ORDER — POTASSIUM CHLORIDE 10 MEQ/100ML IV SOLN
10.0000 meq | INTRAVENOUS | Status: AC
  Administered 2017-08-14 (×4): 10 meq via INTRAVENOUS
  Filled 2017-08-14 (×3): qty 100

## 2017-08-14 MED ORDER — POTASSIUM CHLORIDE 10 MEQ/100ML IV SOLN
INTRAVENOUS | Status: AC
  Administered 2017-08-14: 10 meq via INTRAVENOUS
  Filled 2017-08-14: qty 100

## 2017-08-14 MED ORDER — ORAL CARE MOUTH RINSE
15.0000 mL | Freq: Two times a day (BID) | OROMUCOSAL | Status: DC
  Administered 2017-08-14 – 2017-08-21 (×10): 15 mL via OROMUCOSAL

## 2017-08-14 MED ORDER — LORAZEPAM 0.5 MG PO TABS
0.5000 mg | ORAL_TABLET | Freq: Three times a day (TID) | ORAL | Status: DC | PRN
  Administered 2017-08-14 – 2017-08-17 (×7): 0.5 mg via ORAL
  Filled 2017-08-14 (×7): qty 1

## 2017-08-14 NOTE — Progress Notes (Signed)
[facility] Progress Note     Subjective: CC- abdominal pain Patient reports persistent abdominal pain. States that she slept well last night but woke up this morning with worsening pain; pain medication has helped. Denies bloating. She does report some mild nausea, no emesis since 4/21. No BM or flatus. Last BM she thinks was on 4/22.  States that she is supposed to have chemo again on 4/29.  Objective: Vital signs in last 24 hours: Temp:  [97.8 F (36.6 C)-98.2 F (36.8 C)] 98.1 F (36.7 C) (04/25 0651) Pulse Rate:  [80-103] 85 (04/25 0651) Resp:  [14-21] 18 (04/25 0651) BP: (100-131)/(58-87) 115/64 (04/25 0651) SpO2:  [96 %-100 %] 96 % (04/25 0651) Weight:  [120 lb 8 oz (54.7 kg)-122 lb 9.2 oz (55.6 kg)] 122 lb 9.2 oz (55.6 kg) (04/24 2311)    Intake/Output from previous day: 04/24 0701 - 04/25 0700 In: 1713.3 [I.V.:713.3; IV Piggyback:1000] Out: -  Intake/Output this shift: No intake/output data recorded.  PE: Gen:  Alert, NAD HEENT: EOM's intact, pupils equal and round Card:  RRR, no M/G/R heard Pulm:  CTAB, no W/R/R, effort normal Abd: Soft, nondistended, diffuse tenderness without rebound or guarding, +BS, no HSM, no hernia Psych: A&Ox3  Skin: no rashes noted, warm and dry  Lab Results:  Recent Labs    08/13/17 1343 08/14/17 0433  WBC 8.4 6.1  HGB 14.0 12.8  HCT 40.9 37.2  PLT 236 200   BMET Recent Labs    08/13/17 1343 08/14/17 0433  NA 137 137  K 3.6 3.5  CL 102 104  CO2 32* 27  GLUCOSE 99 115*  BUN 28* 25*  CREATININE 0.80 0.79  CALCIUM 9.1 8.4*   PT/INR No results for input(s): LABPROT, INR in the last 72 hours. CMP     Component Value Date/Time   NA 137 08/14/2017 0433   K 3.5 08/14/2017 0433   CL 104 08/14/2017 0433   CO2 27 08/14/2017 0433   GLUCOSE 115 (H) 08/14/2017 0433   BUN 25 (H) 08/14/2017 0433   CREATININE 0.79 08/14/2017 0433   CREATININE 0.80 08/13/2017 1343   CALCIUM 8.4 (L) 08/14/2017 0433   PROT 6.6  08/13/2017 1343   PROT 6.7 11/28/2016 0849   ALBUMIN 3.2 (L) 08/13/2017 1343   ALBUMIN 4.3 11/28/2016 0849   AST 14 08/13/2017 1343   ALT 13 08/13/2017 1343   ALKPHOS 86 08/13/2017 1343   BILITOT 0.7 08/13/2017 1343   GFRNONAA >60 08/14/2017 0433   GFRNONAA >60 08/13/2017 1343   GFRAA >60 08/14/2017 0433   GFRAA >60 08/13/2017 1343   Lipase     Component Value Date/Time   LIPASE 39 08/13/2017 1728       Studies/Results: Ct Abdomen Pelvis W Contrast  Result Date: 08/13/2017 CLINICAL DATA:  67 year old female with history of colon cancer presents with nausea and vomiting as well as progressive abdominal pain. Concern for small bowel obstruction. EXAM: CT ABDOMEN AND PELVIS WITH CONTRAST TECHNIQUE: Multidetector CT imaging of the abdomen and pelvis was performed using the standard protocol following bolus administration of intravenous contrast. CONTRAST:  58mL ISOVUE-300 IOPAMIDOL (ISOVUE-300) INJECTION 61%, 134mL ISOVUE-300 IOPAMIDOL (ISOVUE-300) INJECTION 61% COMPARISON:  CT from 07/18/2017 and 07/27/2017 FINDINGS: Lower chest: Normal size heart without pericardial effusion. Pulmonary mass or pulmonary consolidation. No effusion. Hepatobiliary: Status post cholecystectomy. Mild intrahepatic ductal dilatation appears chronic and likely related to reservoir effect cholecystectomy. No choledocholithiasis. No focal liver lesion. Pancreas: No pancreatic mass or pathologic ductal dilatation.  Spleen: Normal size spleen without mass. Adrenals/Urinary Tract: Stable mild nodular appearance of the left adrenal apex. Symmetric enhancement of both kidneys with water attenuating 13 mm cyst in the upper pole of the left kidney. No nephrolithiasis nor hydroureteronephrosis. Nondistended urinary bladder without focal mural thickening, mass or calculus. Stomach/Bowel: The stomach is distended with enteric contrast. No intraluminal mass or focal mural thickening. There are fluid-filled dilated small bowel loops  out of proportion to nondistended large bowel, caliber up to 2.7 cm and consistent with changes of high-grade small bowel obstruction. Suspect that the transition zone is within the pelvis given irregularly enhancing masslike opacities within the lower pelvis concerning for changes of possible peritoneal carcinomatosis and/or necrotic adenopathy. The colon is decompressed in appearance. Interval redevelopment of a small to moderate volume of ascites within the upper abdomen with redemonstration of omental edema and thickening. Vascular/Lymphatic: Nonaneurysmal atherosclerotic aorta and branch vessels. Reproductive: Mixed solid and cystic masslike abnormality in the left adnexa possibly of ovarian etiology similar in appearance to prior. Similar in appearance uterus and right adnexa. Other: Masslike abnormality in the right hemipelvis is redemonstrated, margins are ill-defined and difficult to delineate. This may be contributing to the patient's small-bowel obstruction either from adhesion or direct localized involvement of small bowel. Musculoskeletal: No aggressive osseous lesions. Degenerative changes along the dorsal spine. IMPRESSION: 1. The findings within the abdomen and pelvis are similar to that of 07/12/2017. There has been redevelopment of a small to moderate volume of ascites, small bowel dilatation compatible with small bowel obstruction with probable zone of transition in the right hemipelvis secondary to pelvic mass like abnormality that may reflect peritoneal carcinomatosis for localized pelvic necrotic adenopathy. 2. Redemonstration of omental disease with induration and thickening. Electronically Signed   By: Ashley Royalty M.D.   On: 08/13/2017 20:19    Anti-infectives: Anti-infectives (From admission, onward)   None       Assessment/Plan H/o CAD s/p cardiac stenting 09/2016 - on brillinta Depression/anxiety Constipation  Colon cancer with peritoneal carcinomatosis - followed by Dr.  Alvy Bimler, currently undergoing chemo SBO likely 2/2 carcinomatosis - CT scan shows small bowel dilatation compatible with small bowel obstruction with probable zone of transition in the right hemipelvis secondary to pelvic mass like abnormality that may reflect peritoneal carcinomatosis for localized pelvic necrotic adenopathy - no BM or flatus this morning, mild nausea, no emesis since 4/21  ID - none FEN - IVF, NPO VTE - SCDs Foley - none  Plan - Patient mildly nauseated but declines NG tube. Abdominal xray pending this morning. Continue NPO for now. Recommend consulting oncology for recommendations.  Per MD there is no good operative solution if SBO does not resolve; could consider venting G-tube? Will discuss with MD.   LOS: 1 day    Wellington Hampshire , [facility] 08/14/2017, 10:11 AM Pager: [phone] Consults: [phone] Mon-Fri 7:00 am-4:30 pm Sat-Sun 7:00 am-11:30 am

## 2017-08-14 NOTE — Progress Notes (Signed)
PROGRESS NOTE    Carol Ferguson  WFU:[id number] DOB: 09-Sep-1950 DOA: 08/13/2017 PCP: [name], PA-C   Brief Narrative:  Carol Ferguson is a 67 y.o. female with history of peritoneal carcinomatosis being followed by Oncology [name] and is receiving chemotherapy, CAD status post stenting for ST elevation MI in June 2018 on aspirin and Brilinta presents to the ER because of persistent abdominal pain.  Patient has been having abdominal pain over the last 1 week.  This is a 1 week following the chemotherapy.  Had multiple bouts of vomiting 5 days ago following which patient has not had much food and has not had any further episodes of vomiting.  Last bowel movement was around 3 days ago.  Pain is mostly diffuse and sometimes colicky appearance denies any chest pain or shortness of breath but tender with abdominal pain radiates to her chest.  In the ER patient had CT abdomen and pelvis which show concern for small bowel obstruction General Surgery was consulted for further evaluation and recommendations.  Because there may not be any good surgical options general surgery recommended oncology input for evaluation and recommendations.  Assessment & Plan:   Principal Problem:   SBO (small bowel obstruction) (HCC) Active Problems:   CAD (coronary artery disease), native coronary artery   Peritoneal carcinomatosis (HCC)   Colon cancer (HCC)  SBO with a History of peritoneal Carcinomatosis -CT Abd/Pelvis showed The findings within the abdomen and pelvis are similar to that of 07/12/2017. There has been redevelopment of a small to moderate volume of ascites, small bowel dilatation compatible with small bowel obstruction with probable zone of transition in the right hemipelvis secondary to pelvic mass like abnormality that may reflect peritoneal carcinomatosis for localized pelvic necrotic adenopathy. Redemonstration of omental disease with induration and thickening.  -General Surgery  Consulted and appreciate further recommendations  -Patient is kept NPO except medications and sips -Continue with D5 Normal Saline of 100 mL's per hour for 24 hours -Pain control with IV morphine 2 mg every 3 as needed for severe pain -Continue with antiemetics with Zofran 4 mg p.o./IV as well as 5 mg of IV Compazine every 6 as needed for nausea and vomiting that is refractory to Zofran -Repeat KUB this AM showed There is still gaseous distention of small bowel loops consistent with a persistent partial small bowel obstruction. Some contrast is noted in the right abdomen possibly within the right colon. Surgical clips are noted in the right upper quadrant from prior cholecystectomy. -Since patient has had no further episodes of vomiting or nausea NG tube was not placed. -Further recommendations per surgery.   -Plan is to manage conservatively at this time.   -General Surgery is concerned that these issues were recurrent and they do not think there is an operative solution to this however they may consider a venting G-tube -Will notify Oncology of Patient's Hospitalization and appreciate any further evaluation and recommendations from an Oncology perspective   History of CAD and Ischemic Cardiomyopathy status post stenting in June 2018  -Continue with aspirin 81 mg p.o. daily, atorvastatin 10 mg p.o. nightly, Brilinta 90 mg p.o. twice daily  Peritoneal Carcinomatosis  -Likely GI in Origin from -Being followed by Oncology [name] currently undergoing chemotherapy with her next dose on 4/29 -Will Consult Oncology formally    Depression and Anxiety -Patient takes Fluoxetine 10 mg p.o. every other day and will restart in the a.m. -Resume Lorazepam 0.5 mg every 8 as needed for  Anxiety  Hyperglycemia -Likely Reactive -Check Hemoglobin A1c; Last Value was 5.6 in 09/2016 -Continue to treat blood sugars and if consistently elevated will place on a Sensitive sliding scale insulin  DVT  prophylaxis: SCDs. Code Status: FULL CODE Family Communication: No family present at bedside Disposition Plan: Remain inpatient for treatment of small bowel obstruction  Consultants:  General Surgery    Procedures: None   Antimicrobials:  Anti-infectives (From admission, onward)   None     Subjective: Examination and had some nausea.  Complained of some abdominal tenderness but did not have any actual vomiting.  No chest pain, shortness breath, lightheadedness or dizziness.  States this is happened before in the past and does not want a NG tube if possible.  No other complaints or concerns at this time  Objective: Vitals:   08/13/17 2044 08/13/17 2130 08/13/17 2311 08/14/17 0651  BP: 100/77 110/65 117/62 115/64  Pulse: 86 95 98 85  Resp: 17 (!) 21 20 18   Temp:   98.2 F (36.8 C) 98.1 F (36.7 C)  TempSrc:   Oral Oral  SpO2: 99% 97% 98% 96%  Weight:   55.6 kg (122 lb 9.2 oz)   Height:   5\' 5"  (1.651 m)     Intake/Output Summary (Last 24 hours) at 08/14/2017 0756 Last data filed at 08/14/2017 0350 Gross per 24 hour  Intake 1713.33 ml  Output -  Net 1713.33 ml   Filed Weights   08/13/17 2311  Weight: 55.6 kg (122 lb 9.2 oz)   Examination: Physical Exam:  Constitutional: Thin Caucasian female in NAD and appears calm  Eyes: Lids and conjunctivae normal, sclerae anicteric  ENMT: External Ears, Nose appear normal. Grossly normal hearing. Neck: Appears normal, supple, no cervical masses, normal ROM, no appreciable thyromegaly Respiratory: Diminished to auscultation bilaterally, no wheezing, rales, rhonchi or crackles. Normal respiratory effort and patient is not tachypenic. No accessory muscle use.  Cardiovascular: RRR, no murmurs / rubs / gallops. S1 and S2 auscultated. No extremity edema. Abdomen: Soft, Diffusely tender to palpate, Slight distention. Hypertympanic to percuss. No masses palpated. No appreciable hepatosplenomegaly. Bowel sounds positive x4.  GU:  Deferred. Musculoskeletal: No clubbing / cyanosis of digits/nails. No joint deformity upper and lower extremities.  Skin: No rashes, lesions, ulcers on a limited skin evaluation. No induration; Warm and dry.  Neurologic: CN 2-12 grossly intact with no focal deficits. Romberg sign and cerebellar reflexes not assessed.  Psychiatric: Normal judgment and insight. Alert and oriented x 3. Normal mood and appropriate affect.   Data Reviewed: I have personally reviewed following labs and imaging studies  CBC: Recent Labs  Lab 08/13/17 1343 08/14/17 0433  WBC 8.4 6.1  NEUTROABS 6.4  --   HGB 14.0 12.8  HCT 40.9 37.2  MCV 95.9 95.1  PLT 236 093   Basic Metabolic Panel: Recent Labs  Lab 08/13/17 1343 08/14/17 0433  NA 137 137  K 3.6 3.5  CL 102 104  CO2 32* 27  GLUCOSE 99 115*  BUN 28* 25*  CREATININE 0.80 0.79  CALCIUM 9.1 8.4*   GFR: Estimated Creatinine Clearance: 58.3 mL/min (by C-G formula based on SCr of 0.79 mg/dL). Liver Function Tests: Recent Labs  Lab 08/13/17 1343  AST 14  ALT 13  ALKPHOS 86  BILITOT 0.7  PROT 6.6  ALBUMIN 3.2*   Recent Labs  Lab 08/13/17 1728  LIPASE 39   No results for input(s): AMMONIA in the last 168 hours. Coagulation Profile: No results for input(s):  INR, PROTIME in the last 168 hours. Cardiac Enzymes: No results for input(s): CKTOTAL, CKMB, CKMBINDEX, TROPONINI in the last 168 hours. BNP (last 3 results) No results for input(s): PROBNP in the last 8760 hours. HbA1C: No results for input(s): HGBA1C in the last 72 hours. CBG: Recent Labs  Lab 08/14/17 0044  GLUCAP 97   Lipid Profile: No results for input(s): CHOL, HDL, LDLCALC, TRIG, CHOLHDL, LDLDIRECT in the last 72 hours. Thyroid Function Tests: No results for input(s): TSH, T4TOTAL, FREET4, T3FREE, THYROIDAB in the last 72 hours. Anemia Panel: No results for input(s): VITAMINB12, FOLATE, FERRITIN, TIBC, IRON, RETICCTPCT in the last 72 hours. Sepsis Labs: Recent Labs    Lab 08/13/17 1739  LATICACIDVEN 0.71    No results found for this or any previous visit (from the past 240 hour(s)).   Radiology Studies: Ct Abdomen Pelvis W Contrast  Result Date: 08/13/2017 CLINICAL DATA:  67 year old female with history of colon cancer presents with nausea and vomiting as well as progressive abdominal pain. Concern for small bowel obstruction. EXAM: CT ABDOMEN AND PELVIS WITH CONTRAST TECHNIQUE: Multidetector CT imaging of the abdomen and pelvis was performed using the standard protocol following bolus administration of intravenous contrast. CONTRAST:  19mL ISOVUE-300 IOPAMIDOL (ISOVUE-300) INJECTION 61%, 146mL ISOVUE-300 IOPAMIDOL (ISOVUE-300) INJECTION 61% COMPARISON:  CT from 07/18/2017 and 07/27/2017 FINDINGS: Lower chest: Normal size heart without pericardial effusion. Pulmonary mass or pulmonary consolidation. No effusion. Hepatobiliary: Status post cholecystectomy. Mild intrahepatic ductal dilatation appears chronic and likely related to reservoir effect cholecystectomy. No choledocholithiasis. No focal liver lesion. Pancreas: No pancreatic mass or pathologic ductal dilatation. Spleen: Normal size spleen without mass. Adrenals/Urinary Tract: Stable mild nodular appearance of the left adrenal apex. Symmetric enhancement of both kidneys with water attenuating 13 mm cyst in the upper pole of the left kidney. No nephrolithiasis nor hydroureteronephrosis. Nondistended urinary bladder without focal mural thickening, mass or calculus. Stomach/Bowel: The stomach is distended with enteric contrast. No intraluminal mass or focal mural thickening. There are fluid-filled dilated small bowel loops out of proportion to nondistended large bowel, caliber up to 2.7 cm and consistent with changes of high-grade small bowel obstruction. Suspect that the transition zone is within the pelvis given irregularly enhancing masslike opacities within the lower pelvis concerning for changes of possible  peritoneal carcinomatosis and/or necrotic adenopathy. The colon is decompressed in appearance. Interval redevelopment of a small to moderate volume of ascites within the upper abdomen with redemonstration of omental edema and thickening. Vascular/Lymphatic: Nonaneurysmal atherosclerotic aorta and branch vessels. Reproductive: Mixed solid and cystic masslike abnormality in the left adnexa possibly of ovarian etiology similar in appearance to prior. Similar in appearance uterus and right adnexa. Other: Masslike abnormality in the right hemipelvis is redemonstrated, margins are ill-defined and difficult to delineate. This may be contributing to the patient's small-bowel obstruction either from adhesion or direct localized involvement of small bowel. Musculoskeletal: No aggressive osseous lesions. Degenerative changes along the dorsal spine. IMPRESSION: 1. The findings within the abdomen and pelvis are similar to that of 07/12/2017. There has been redevelopment of a small to moderate volume of ascites, small bowel dilatation compatible with small bowel obstruction with probable zone of transition in the right hemipelvis secondary to pelvic mass like abnormality that may reflect peritoneal carcinomatosis for localized pelvic necrotic adenopathy. 2. Redemonstration of omental disease with induration and thickening. Electronically Signed   By: Ashley Royalty M.D.   On: 08/13/2017 20:19   Scheduled Meds: . aspirin  81 mg Oral Daily  .  atorvastatin  10 mg Oral Daily  . mouth rinse  15 mL Mouth Rinse BID  . ticagrelor  90 mg Oral BID   Continuous Infusions: . dextrose 5 % and 0.9% NaCl 100 mL/hr at 08/13/17 2334    LOS: 1 day   Kerney Elbe, DO [facility] Pager [phone]  If 7PM-7AM, please contact night-coverage [email or web address] Password [facility] 08/14/2017, 7:56 AM

## 2017-08-14 NOTE — Consult Note (Signed)
Reason for the consult: Peritoneal carcinomatosis  HPI: 67 year old woman currently of [place] where she lived the majority of her life.  She started developing symptoms of abdominal pain and developed profound weight loss before presenting in March 2019 with small bowel obstruction.  At that time she was found to have peritoneal carcinomatosis on imaging studies.  Biopsy obtained at that time which showed GI malignancy and less likely of a GYN source.  She was treated by [name] with 2 cycles of FOLFOX with the last cycle of chemotherapy given on August 06, 2017.  After 1 cycle of chemotherapy, she was seen in the emergency department on July 27, 2017 and had a CT scan which showed improvement in her and her ascites without any small bowel obstruction.  After completing the second cycle of chemotherapy, she did reasonably well but presented again on 08/13/2017 with symptoms of abdominal pain and vomiting.  Repeat CT scan on August 13, 2017 which showed small to moderate volume ascites and small bowel dilation compatible with small bowel obstruction.  She was started on IV hydration and general surgery was consulted.  No surgical intervention felt is helpful at this time given her presentation.  Clinically, she remains nauseated but she is not vomiting at this time.  She has not had a bowel movement and passes very little gas.  She continues to have abdominal pain that has been manageable at this time.  She does not report any headaches, blurry vision, syncope or seizures. Does not report any fevers, chills or sweats.  Does not report any cough, wheezing or hemoptysis.  Does not report any chest pain, palpitation, orthopnea or leg edema.  Does not report hematochezia or melena. Does not report any skeletal complaints.    Does not report frequency, urgency or hematuria.  Does not report any skin rashes or lesions. Does not report any heat or cold intolerance.  Does not report any lymphadenopathy or  petechiae.  Does not report any anxiety or depression.  Remaining review of systems is negative.    Past Medical History:  Diagnosis Date  . Anxiety   . 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 CAD (coronary artery disease)    a. Inf STEMI/LHC 09/2016 which showed occluded mid LCx treated with PCI, DES; remaining cath details included a proximal to mid LAD lesion of 20% stenosis, and proximal to mid RCA 25% stenosis. EF 55-60%.  . Depression   . Diverticulosis   . Gallstones   . Ischemic cardiomyopathy    a. 2D Echo 09/28/16 showed EF 55-60%, probable severe hypokinesis of the entire inferiormyocardium, normal diastolic parameters.  . Myocardial infarction ([place]) 09/26/2016  . Pneumonia   . Presence of tooth-root and mandibular implants   . Tobacco abuse   . Wears glasses   :  Past Surgical History:  Procedure Laterality Date  . CERVICAL CONIZATION W/BX N/A 01/28/2017   Procedure: CONIZATION CERVIX WITH BIOPSY;  Surgeon: [name], MD;  Location: [facility];  Service: Gynecology;  Laterality: N/A;  . CHOLECYSTECTOMY    . CORONARY STENT INTERVENTION N/A 09/26/2016   Procedure: Coronary Stent Intervention;  Surgeon: Belva Crome, MD;  Location: [place] [facility];  Service: Cardiovascular;  Laterality: N/A;  . DILATION AND CURETTAGE OF UTERUS N/A 01/28/2017   Procedure: DILATATION AND CURETTAGE WITH ULTRASOUND GUIDENCE;  Surgeon: [name], MD;  Location: [facility];  Service: Gynecology;  Laterality: N/A;  . LEFT HEART  CATH AND CORONARY ANGIOGRAPHY N/A 09/26/2016   Procedure: Left Heart Cath and Coronary Angiography;  Surgeon: Belva Crome, MD;  Location: [facility] [place] [facility];  Service: Cardiovascular;  Laterality: N/A;  :   Current Facility-Administered Medications:  .  acetaminophen (TYLENOL) tablet 650 mg, 650 mg, Oral, Q6H PRN **OR** acetaminophen (TYLENOL) suppository 650 mg, 650 mg, Rectal, Q6H PRN, Rise Patience, MD .  aspirin chewable  tablet 81 mg, 81 mg, Oral, Daily, Rise Patience, MD, 81 mg at 08/14/17 1002 .  atorvastatin (LIPITOR) tablet 10 mg, 10 mg, Oral, Daily, Hal Hope, Arshad N, MD .  dextrose 5 %-0.9 % sodium chloride infusion, , Intravenous, Continuous, Rise Patience, MD, Last Rate: 100 mL/hr at 08/14/17 1200 .  LORazepam (ATIVAN) tablet 0.5 mg, 0.5 mg, Oral, Q8H PRN, Raiford Noble Latif, DO, 0.5 mg at 08/14/17 1504 .  MEDLINE mouth rinse, 15 mL, Mouth Rinse, BID, Rise Patience, MD, 15 mL at 08/14/17 1014 .  morphine 4 MG/ML injection 2 mg, 2 mg, Intravenous, Q3H PRN, Rise Patience, MD, 2 mg at 08/14/17 1504 .  nitroGLYCERIN (NITROSTAT) SL tablet 0.4 mg, 0.4 mg, Sublingual, Q5 Min x 3 PRN, Rise Patience, MD .  ondansetron [facility]) tablet 4 mg, 4 mg, Oral, Q6H PRN **OR** ondansetron (ZOFRAN) injection 4 mg, 4 mg, Intravenous, Q6H PRN, Rise Patience, MD, 4 mg at 08/14/17 0841 .  prochlorperazine (COMPAZINE) injection 5 mg, 5 mg, Intravenous, Q6H PRN, Rise Patience, MD, 5 mg at 08/14/17 1201 .  sodium chloride flush (NS) 0.9 % injection 10-40 mL, 10-40 mL, Intracatheter, PRN, Rise Patience, MD .  ticagrelor [facility]) tablet 90 mg, 90 mg, Oral, BID, Rise Patience, MD, 90 mg at 08/14/17 1002  Facility-Administered Medications Ordered in Other Encounters:  .  heparin lock flush 100 unit/mL, 500 Units, Intracatheter, Once PRN, Alvy Bimler, Ni, MD .  ondansetron (ZOFRAN) injection 4 mg, 4 mg, Intravenous, Once, Tanner, Van E., PA-C .  sodium chloride flush (NS) 0.9 % injection 10 mL, 10 mL, Intracatheter, PRN, Alvy Bimler, Ni, MD:  Allergies  Allergen Reactions  . Flagyl [Metronidazole] Nausea Only  :  Family History  Problem Relation Age of Onset  . Cancer Brother        MELANOMA  . Heart disease Father   . Stroke Father   . Heart disease Brother   . Diabetes Brother   . Heart attack Brother   . Heart failure Brother   :  Social History    Socioeconomic History  . Marital status: Divorced    Spouse name: Not on file  . Number of children: 0  . Years of education: Not on file  . Highest education level: Not on file  Occupational History  . Occupation: retired  Scientific laboratory technician  . Financial resource strain: Not on file  . Food insecurity:    Worry: Not on file    Inability: Not on file  . Transportation needs:    Medical: Not on file    Non-medical: Not on file  Tobacco Use  . Smoking status: Former Smoker    Packs/day: 1.00    Years: 35.00    Pack years: 35.00    Types: Cigarettes    Last attempt to quit: 09/26/2016    Years since quitting: 0.8  . Smokeless tobacco: Never Used  Substance and Sexual Activity  . Alcohol use: Yes    Comment: occasional  . Drug use: No  . Sexual activity: Never  Lifestyle  . Physical activity:    Days per week: Not on file    Minutes per session: Not on file  . Stress: Not on file  Relationships  . Social connections:    Talks on phone: Not on file    Gets together: Not on file    Attends religious service: Not on file    Active member of club or organization: Not on file    Attends meetings of clubs or organizations: Not on file    Relationship status: Not on file  . Intimate partner violence:    Fear of current or ex partner: Not on file    Emotionally abused: Not on file    Physically abused: Not on file    Forced sexual activity: Not on file  Other Topics Concern  . Not on file  Social History Narrative  . Not on file  :  Pertinent items are noted in HPI.  Exam: Blood pressure (!) 116/50, pulse 78, temperature 98.3 F (36.8 C), temperature source Oral, resp. rate 18, height 5\' 5"  (1.651 m), weight 122 lb 9.2 oz (55.6 kg), SpO2 98 %. General appearance: Chronically ill-appearing woman.  Without distress. Head: atraumatic without any abnormalities. Eyes: conjunctivae/corneas clear. PERRL.  Sclera anicteric. Throat: Dry mucous membranes without ulcers or  thrush. Resp: clear to auscultation bilaterally without rhonchi, wheezes or dullness to percussion. Cardio: regular rate and rhythm, S1, S2 normal, no murmur, click, rub or gallop GI: soft, tender to touch with hyperactive bowel sounds.  No rebound or guarding. Skin: Skin color, texture, turgor normal. No rashes or lesions.  PICC line site without any erythema or induration. Lymph nodes: Cervical, supraclavicular, and axillary nodes normal. Neurologic: Grossly normal without any motor, sensory or deep tendon reflexes. Musculoskeletal: No joint deformity or effusion.  Recent Labs    08/13/17 1343 08/14/17 0433  WBC 8.4 6.1  HGB 14.0 12.8  HCT 40.9 37.2  PLT 236 200   Recent Labs    08/13/17 1343 08/14/17 0433  NA 137 137  K 3.6 3.5  CL 102 104  CO2 32* 27  GLUCOSE 99 115*  BUN 28* 25*  CREATININE 0.80 0.79  CALCIUM 9.1 8.4*       Dg Abd 1 View  Result Date: 08/14/2017 CLINICAL DATA:  Abdominal distention and pain EXAM: ABDOMEN - 1 VIEW COMPARISON:  CT abdomen and pelvis of 08/13/2016 FINDINGS: There is still gaseous distention of small bowel loops consistent with a persistent partial small bowel obstruction. Some contrast is noted in the right abdomen possibly within the right colon. Surgical clips are noted in the right upper quadrant from prior cholecystectomy. IMPRESSION: Persistent partial small bowel obstruction. Electronically Signed   By: Ivar Drape M.D.   On: 08/14/2017 10:25   Ct Abdomen Pelvis W Contrast  Result Date: 08/13/2017 CLINICAL DATA:  67 year old female with history of colon cancer presents with nausea and vomiting as well as progressive abdominal pain. Concern for small bowel obstruction. EXAM: CT ABDOMEN AND PELVIS WITH CONTRAST TECHNIQUE: Multidetector CT imaging of the abdomen and pelvis was performed using the standard protocol following bolus administration of intravenous contrast. CONTRAST:  10mL ISOVUE-300 IOPAMIDOL (ISOVUE-300) INJECTION 61%,  165mL ISOVUE-300 IOPAMIDOL (ISOVUE-300) INJECTION 61% COMPARISON:  CT from 07/18/2017 and 07/27/2017 FINDINGS: Lower chest: Normal size heart without pericardial effusion. Pulmonary mass or pulmonary consolidation. No effusion. Hepatobiliary: Status post cholecystectomy. Mild intrahepatic ductal dilatation appears chronic and likely related to reservoir effect cholecystectomy. No choledocholithiasis. No focal liver  lesion. Pancreas: No pancreatic mass or pathologic ductal dilatation. Spleen: Normal size spleen without mass. Adrenals/Urinary Tract: Stable mild nodular appearance of the left adrenal apex. Symmetric enhancement of both kidneys with water attenuating 13 mm cyst in the upper pole of the left kidney. No nephrolithiasis nor hydroureteronephrosis. Nondistended urinary bladder without focal mural thickening, mass or calculus. Stomach/Bowel: The stomach is distended with enteric contrast. No intraluminal mass or focal mural thickening. There are fluid-filled dilated small bowel loops out of proportion to nondistended large bowel, caliber up to 2.7 cm and consistent with changes of high-grade small bowel obstruction. Suspect that the transition zone is within the pelvis given irregularly enhancing masslike opacities within the lower pelvis concerning for changes of possible peritoneal carcinomatosis and/or necrotic adenopathy. The colon is decompressed in appearance. Interval redevelopment of a small to moderate volume of ascites within the upper abdomen with redemonstration of omental edema and thickening. Vascular/Lymphatic: Nonaneurysmal atherosclerotic aorta and branch vessels. Reproductive: Mixed solid and cystic masslike abnormality in the left adnexa possibly of ovarian etiology similar in appearance to prior. Similar in appearance uterus and right adnexa. Other: Masslike abnormality in the right hemipelvis is redemonstrated, margins are ill-defined and difficult to delineate. This may be contributing  to the patient's small-bowel obstruction either from adhesion or direct localized involvement of small bowel. Musculoskeletal: No aggressive osseous lesions. Degenerative changes along the dorsal spine. IMPRESSION: 1. The findings within the abdomen and pelvis are similar to that of 07/12/2017. There has been redevelopment of a small to moderate volume of ascites, small bowel dilatation compatible with small bowel obstruction with probable zone of transition in the right hemipelvis secondary to pelvic mass like abnormality that may reflect peritoneal carcinomatosis for localized pelvic necrotic adenopathy. 2. Redemonstration of omental disease with induration and thickening. Electronically Signed   By: Ashley Royalty M.D.   On: 08/13/2017 20:19   Ct Abdomen Pelvis W Contrast  Result Date: 07/27/2017 CLINICAL DATA:  Recently diagnosed with gastric cancer, unable to have surgery. Patient started her first chemotherapy treatment. Patient states vomiting bile earlier today. EXAM: CT ABDOMEN AND PELVIS WITH CONTRAST TECHNIQUE: Multidetector CT imaging of the abdomen and pelvis was performed using the standard protocol following bolus administration of intravenous contrast. CONTRAST:  155mL ISOVUE-300 IOPAMIDOL (ISOVUE-300) INJECTION 61% COMPARISON:  July 12, 2017 FINDINGS: Lower chest: No acute abnormality. Hepatobiliary: The liver is normal without focal liver lesion. Patient status post prior cholecystectomy. Mild postsurgical intrahepatic biliary ductal dilatation is identified. The common bile duct is normal. Pancreas: Unremarkable. No pancreatic ductal dilatation or surrounding inflammatory changes. Spleen: Normal in size without focal abnormality. Adrenals/Urinary Tract: Hypertrophy of left adrenal gland is unchanged. The right adrenal gland is normal. Stable left kidney cyst in the upper pole is noted. The kidneys are otherwise unremarkable. There is no hydronephrosis bilaterally. The bladder is normal.  Stomach/Bowel: There is wall thickening of the posterior fundal stomach. There is no obstruction of the stomach. There is no small bowel obstruction. No colonic obstruction is identified. The appendix is normal. The sigmoid colon is decompressed thickened bowel wall. Vascular/Lymphatic: Aortic atherosclerosis. No enlarged abdominal or pelvic lymph nodes. Reproductive: Mixed cystic and solid mass in the left ovary is unchanged. The uterus and right ovary are normal. Other: There is interval resolution of previously noted ascites. Omental nodularities of the left lower quadrant and right abdomen are unchanged. Musculoskeletal: No acute or significant osseous findings. Degenerative joint changes of the spine are noted. IMPRESSION: The previously noted ascites in the  abdomen and pelvis has resolved. 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 No small bowel or colonic obstruction is identified. Generalized thickened bowel wall of the sigmoid colon. Mixed cystic and solid mass of the left ovary is unchanged. Status post prior cholecystectomy with mild postsurgical intrahepatic biliary ductal dilatation. Electronically Signed   By: Abelardo Diesel M.D.   On: 07/27/2017 20:29     Assessment and Plan:   67 year old woman with the following issues:  1.  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  She presented with abdominal ascites, chronic abdominal pain and weight loss.  She received 2 cycles of FOLFOX chemotherapy under the care of [name] without any significant changes in her CT scan or clinical status.  She was hospitalized on 08/13/2017 with bowel obstruction and symptoms of abdominal pain.  The natural course of this disease was discussed today with the patient as well as the treatment goal.  She fully understands that she has an incurable malignancy and the role of chemotherapy is to palliate her symptoms.  Although it is too early to judge the  success of chemotherapy it appeared that it was partially successful with slight improvement in her scan on July 27, 2017 but clinically she is hospitalized again with small bowel obstruction.  Her prognosis was discussed today in detail and her outcome depends on her clinical status in the next few days.  If her small bowel obstruction continues to persist without any improvement and surgery appears to be not an option then her prognosis would be very poor associated with very limited life expectancy.  If her symptoms do not improve and her blockage becomes complete then we are looking at potentially proceeding with comfort measures and consider a venting G-tube.  On the other hand, if her small bowel obstruction resolves in a way that she can continue to take oral nutrition and hydration and potentially continue with chemotherapy that might give her a chance to palliate her disease further.  After discussion today, she understands that she is facing unlikely chance of full recovery and will consider proceeding with palliative options if her symptoms do not improve in the next few days.  I also recommended palliative medicine consult which she is agreeable to discuss goals of care and symptom management moving forward.  2.  Small bowel obstruction: I have recommended supportive measures for the next few days including n.p.o., IV hydration per recommendation of surgery and the primary team and the hope of spontaneous recovery.  If no recovery occurs, I I am not optimistic about long-term survival with her at this time.  3.  Pain: Appears to be manageable at this time but she is very concerned about this issue long-term.  She fully understands her she may be facing a terminal diagnosis but worried about dealing with pain as she is dying.  I reassured her that everything possible will be done to prevent that from happening.  Involvement of palliative medicine team would be helpful as well.  4.  Prognosis  and goals of care: This was discussed today in detail.  She understands that she has a incurable malignancy and facing very tough odds of recovery.  She has a living will and desires DO NOT RESUSCITATE status at this time.  She will consider hospice option the next few days if her symptoms do not improve.  Please call with any questions regarding her care while she is hospitalized.  I will alert [name]  of her admission and will follow her care upon her return.  80  minutes was spent with the patient face-to-face today.  More than 50% of time was dedicated to patient counseling, education and discussing diagnosis, prognosis and options regarding plans of care.

## 2017-08-15 ENCOUNTER — Inpatient Hospital Stay (HOSPITAL_COMMUNITY): Payer: Medicare Other

## 2017-08-15 DIAGNOSIS — E43 Unspecified severe protein-calorie malnutrition: Secondary | ICD-10-CM

## 2017-08-15 LAB — COMPREHENSIVE METABOLIC PANEL
ALBUMIN: 2.6 g/dL — AB (ref 3.5–5.0)
ALK PHOS: 94 U/L (ref 38–126)
ALT: 28 U/L (ref 14–54)
AST: 30 U/L (ref 15–41)
Anion gap: 9 (ref 5–15)
BUN: 15 mg/dL (ref 6–20)
CALCIUM: 8.3 mg/dL — AB (ref 8.9–10.3)
CO2: 25 mmol/L (ref 22–32)
CREATININE: 0.63 mg/dL (ref 0.44–1.00)
Chloride: 103 mmol/L (ref 101–111)
GFR calc Af Amer: >60 mL/min (ref >60)
GFR calc non Af Amer: >60 mL/min (ref >60)
GLUCOSE: 95 mg/dL (ref 65–99)
Potassium: 3.7 mmol/L (ref 3.5–5.1)
SODIUM: 137 mmol/L (ref 135–145)
Total Bilirubin: 0.7 mg/dL (ref 0.3–1.2)
Total Protein: 5.4 g/dL — ABNORMAL LOW (ref 6.5–8.1)

## 2017-08-15 LAB — CBC WITH DIFFERENTIAL/PLATELET
BASOS ABS: 0 10*3/uL (ref 0.0–0.1)
BASOS PCT: 0 %
EOS ABS: 0.1 10*3/uL (ref 0.0–0.7)
EOS PCT: 1 %
HCT: 35.4 % — ABNORMAL LOW (ref 36.0–46.0)
HEMOGLOBIN: 12.1 g/dL (ref 12.0–15.0)
LYMPHS ABS: 0.9 10*3/uL (ref 0.7–4.0)
Lymphocytes Relative: 13 %
MCH: 32.4 pg (ref 26.0–34.0)
MCHC: 34.2 g/dL (ref 30.0–36.0)
MCV: 94.7 fL (ref 78.0–100.0)
Monocytes Absolute: 1 10*3/uL (ref 0.1–1.0)
Monocytes Relative: 15 %
NEUTROS PCT: 71 %
Neutro Abs: 4.8 10*3/uL (ref 1.7–7.7)
PLATELETS: 181 10*3/uL (ref 150–400)
RBC: 3.74 MIL/uL — AB (ref 3.87–5.11)
RDW: 12.9 % (ref 11.5–15.5)
WBC: 6.8 10*3/uL (ref 4.0–10.5)

## 2017-08-15 LAB — PHOSPHORUS: Phosphorus: 3.1 mg/dL (ref 2.5–4.6)

## 2017-08-15 LAB — GLUCOSE, CAPILLARY
GLUCOSE-CAPILLARY: 85 mg/dL (ref 65–99)
GLUCOSE-CAPILLARY: 93 mg/dL (ref 65–99)
Glucose-Capillary: 94 mg/dL (ref 65–99)

## 2017-08-15 LAB — MAGNESIUM: Magnesium: 1.6 mg/dL — ABNORMAL LOW (ref 1.7–2.4)

## 2017-08-15 MED ORDER — MAGNESIUM SULFATE 2 GM/50ML IV SOLN
2.0000 g | Freq: Once | INTRAVENOUS | Status: AC
  Administered 2017-08-15: 2 g via INTRAVENOUS
  Filled 2017-08-15: qty 50

## 2017-08-15 MED ORDER — DEXTROSE-NACL 5-0.9 % IV SOLN
INTRAVENOUS | Status: DC
  Administered 2017-08-15 – 2017-08-20 (×11): via INTRAVENOUS

## 2017-08-15 NOTE — Progress Notes (Signed)
[facility] Progress Note     Subjective: CC- SBO Patient states that her pain is a little better this morning. She denies any current nausea. No BM or flatus.  Patient met with oncology yesterday. Plan is watchful waiting to see is SBO will resolve or not.   Objective: Vital signs in last 24 hours: Temp:  [98.3 F (36.8 C)-98.9 F (37.2 C)] 98.9 F (37.2 C) (04/26 0549) Pulse Rate:  [71-82] 82 (04/26 0550) Resp:  [18] 18 (04/26 0550) BP: (109-116)/(50-73) 115/55 (04/26 0550) SpO2:  [96 %-98 %] 98 % (04/26 0550)    Intake/Output from previous day: 04/25 0701 - 04/26 0700 In: 1006.7 [I.V.:1006.7] Out: -  Intake/Output this shift: No intake/output data recorded.  PE: Gen:  Alert, NAD HEENT: EOM's intact, pupils equal and round Card:  RRR, no M/G/R heard Pulm:  effort normal Abd: Soft, mild distension, diffuse tenderness, +BS, no HSM, no hernia Psych: A&Ox3  Skin: no rashes noted, warm and dry  Lab Results:  Recent Labs    08/14/17 0433 08/15/17 0410  WBC 6.1 6.8  HGB 12.8 12.1  HCT 37.2 35.4*  PLT 200 181   BMET Recent Labs    08/14/17 0433 08/15/17 0410  NA 137 137  K 3.5 3.7  CL 104 103  CO2 27 25  GLUCOSE 115* 95  BUN 25* 15  CREATININE 0.79 0.63  CALCIUM 8.4* 8.3*   PT/INR No results for input(s): LABPROT, INR in the last 72 hours. CMP     Component Value Date/Time   NA 137 08/15/2017 0410   K 3.7 08/15/2017 0410   CL 103 08/15/2017 0410   CO2 25 08/15/2017 0410   GLUCOSE 95 08/15/2017 0410   BUN 15 08/15/2017 0410   CREATININE 0.63 08/15/2017 0410   CREATININE 0.80 08/13/2017 1343   CALCIUM 8.3 (L) 08/15/2017 0410   PROT 5.4 (L) 08/15/2017 0410   PROT 6.7 11/28/2016 0849   ALBUMIN 2.6 (L) 08/15/2017 0410   ALBUMIN 4.3 11/28/2016 0849   AST 30 08/15/2017 0410   AST 14 08/13/2017 1343   ALT 28 08/15/2017 0410   ALT 13 08/13/2017 1343   ALKPHOS 94 08/15/2017 0410   BILITOT 0.7 08/15/2017 0410   BILITOT 0.7 08/13/2017  1343   GFRNONAA >60 08/15/2017 0410   GFRNONAA >60 08/13/2017 1343   GFRAA >60 08/15/2017 0410   GFRAA >60 08/13/2017 1343   Lipase     Component Value Date/Time   LIPASE 39 08/13/2017 1728       Studies/Results: Dg Abd 1 View  Result Date: 08/14/2017 CLINICAL DATA:  Abdominal distention and pain EXAM: ABDOMEN - 1 VIEW COMPARISON:  CT abdomen and pelvis of 08/13/2016 FINDINGS: There is still gaseous distention of small bowel loops consistent with a persistent partial small bowel obstruction. Some contrast is noted in the right abdomen possibly within the right colon. Surgical clips are noted in the right upper quadrant from prior cholecystectomy. IMPRESSION: Persistent partial small bowel obstruction. Electronically Signed   By: Ivar Drape M.D.   On: 08/14/2017 10:25   Ct Abdomen Pelvis W Contrast  Result Date: 08/13/2017 CLINICAL DATA:  67 year old female with history of colon cancer presents with nausea and vomiting as well as progressive abdominal pain. Concern for small bowel obstruction. EXAM: CT ABDOMEN AND PELVIS WITH CONTRAST TECHNIQUE: Multidetector CT imaging of the abdomen and pelvis was performed using the standard protocol following bolus administration of intravenous contrast. CONTRAST:  44m ISOVUE-300 IOPAMIDOL (ISOVUE-300) INJECTION 61%,  143m ISOVUE-300 IOPAMIDOL (ISOVUE-300) INJECTION 61% COMPARISON:  CT from 07/18/2017 and 07/27/2017 FINDINGS: Lower chest: Normal size heart without pericardial effusion. Pulmonary mass or pulmonary consolidation. No effusion. Hepatobiliary: Status post cholecystectomy. Mild intrahepatic ductal dilatation appears chronic and likely related to reservoir effect cholecystectomy. No choledocholithiasis. No focal liver lesion. Pancreas: No pancreatic mass or pathologic ductal dilatation. Spleen: Normal size spleen without mass. Adrenals/Urinary Tract: Stable mild nodular appearance of the left adrenal apex. Symmetric enhancement of both kidneys  with water attenuating 13 mm cyst in the upper pole of the left kidney. No nephrolithiasis nor hydroureteronephrosis. Nondistended urinary bladder without focal mural thickening, mass or calculus. Stomach/Bowel: The stomach is distended with enteric contrast. No intraluminal mass or focal mural thickening. There are fluid-filled dilated small bowel loops out of proportion to nondistended large bowel, caliber up to 2.7 cm and consistent with changes of high-grade small bowel obstruction. Suspect that the transition zone is within the pelvis given irregularly enhancing masslike opacities within the lower pelvis concerning for changes of possible peritoneal carcinomatosis and/or necrotic adenopathy. The colon is decompressed in appearance. Interval redevelopment of a small to moderate volume of ascites within the upper abdomen with redemonstration of omental edema and thickening. Vascular/Lymphatic: Nonaneurysmal atherosclerotic aorta and branch vessels. Reproductive: Mixed solid and cystic masslike abnormality in the left adnexa possibly of ovarian etiology similar in appearance to prior. Similar in appearance uterus and right adnexa. Other: Masslike abnormality in the right hemipelvis is redemonstrated, margins are ill-defined and difficult to delineate. This may be contributing to the patient's small-bowel obstruction either from adhesion or direct localized involvement of small bowel. Musculoskeletal: No aggressive osseous lesions. Degenerative changes along the dorsal spine. IMPRESSION: 1. The findings within the abdomen and pelvis are similar to that of 07/12/2017. There has been redevelopment of a small to moderate volume of ascites, small bowel dilatation compatible with small bowel obstruction with probable zone of transition in the right hemipelvis secondary to pelvic mass like abnormality that may reflect peritoneal carcinomatosis for localized pelvic necrotic adenopathy. 2. Redemonstration of omental  disease with induration and thickening. Electronically Signed   By: DAshley RoyaltyM.D.   On: 08/13/2017 20:19   Dg Abd Portable 1v  Result Date: 08/15/2017 CLINICAL DATA:  Abdominal pain.  Small-bowel obstruction. EXAM: PORTABLE ABDOMEN - 1 VIEW COMPARISON:  CT abdomen and pelvis 08/13/2017. Single-view of the abdomen 08/14/2017. FINDINGS: Contrast material seen in the ascending colon. There is persistent gaseous distention of small bowel with loops measuring up to approximately 4.3 cm, unchanged. No new abnormality. IMPRESSION: No change in findings consistent with partial small bowel obstruction. Electronically Signed   By: TInge RiseM.D.   On: 08/15/2017 09:24    Anti-infectives: Anti-infectives (From admission, onward)   None       Assessment/Plan H/o CAD s/p cardiac stenting 09/2016 - on brillinta Depression/anxiety Constipation Code status DNR  Colon cancer with peritoneal carcinomatosis - followed by Dr. GAlvy Bimler currently undergoing chemo. Appreciate inpatient oncology consult, palliative care team has also been consulted for goals of care SBO likely 2/2 carcinomatosis - CT scan shows small bowel dilatation compatible with small bowel obstruction with probable zone of transition in the right hemipelvis secondary to pelvic mass like abnormality that may reflect peritoneal carcinomatosis for localized pelvic necrotic adenopathy - per MD no good operative solution  - xray today shows persistent partial SBO, but contrast has travelled further down the right colon  ID - none FEN - IVF, NPO except ice chips and  sips with meds VTE - SCDs Foley - none  Plan -  No BM or flatus, pain and nausea improved this AM with good bowel sounds on exam. No emesis since 4/21. Ok for ice chips for comfort. Per oncology hoping for a spontaneous recovery of SBO, if this does not resolve "not optimistic about long-term survival." At that point could consider venting G-tube for comfort  measures. Palliate to see patient today. Xray in AM.   LOS: 2 days    Wellington Hampshire , [facility] 08/15/2017, 9:43 AM Pager: [phone] Consults: [phone] Mon-Fri 7:00 am-4:30 pm Sat-Sun 7:00 am-11:30 am

## 2017-08-15 NOTE — Progress Notes (Signed)
Initial Nutrition Assessment  DOCUMENTATION CODES:   Severe malnutrition in context of chronic illness  INTERVENTION:   Monitor for goals of care, oral nutrition vs TPN vs comfort measures  If pt remains full code and NPO, recommend initiation of TPN, estimated needs are below  NUTRITION DIAGNOSIS:   Severe Malnutrition related to chronic illness, cancer and cancer related treatments as evidenced by severe muscle depletion, energy intake < or equal to 50% for > or equal to 5 days, percent weight loss(24% in < 1 year)  GOAL:   Patient will meet greater than or equal to 90% of their needs  MONITOR:   Diet advancement, Labs, Weight trends, I & O's  REASON FOR ASSESSMENT:   Malnutrition Screening Tool    ASSESSMENT:   Pt with PMH of diverticulosis, CAD s/p stenting for MI (2018) and Metastatic colon cancer (undergoing chemotherapy) presents with SBO   Per surgical and oncology notes, plan is to wait to see if SBO will resolve or not. Pt is not a surgical candidate, will continue to monitor for discussions regarding goals of care. Palliative Care Team has been consulted.   Spoke with pt and son at bedside.  Pt has been without adequate nutrition for 6 days, reporting last meal was prior to 1300 last Sunday.   Pt met with cancer center RD, in regards to unintentional weight loss. Pt reports weight loss of 40 lbs in 1 year. Per weight records, pt weighed 161 lb in 09/28/16 and is currently 122 lbs. This is  24% weight loss in < 1 year, significant for time frame.   Pt with no BM or flatus and abdominal pain, however, reports no other nutrition impact symptoms at this time.   Labs reviewed; CBG 85-113, Magnesium 1.6, Albumin 2.6 Medications reviewed; D5 at 100 mL/hr  NUTRITION - FOCUSED PHYSICAL EXAM:    Most Recent Value  Orbital Region  Mild depletion  Upper Arm Region  No depletion  Thoracic and Lumbar Region  No depletion  Buccal Region  Mild depletion  Temple Region   Moderate depletion  Clavicle Bone Region  Severe depletion  Clavicle and Acromion Bone Region  Severe depletion  Scapular Bone Region  Severe depletion  Dorsal Hand  Mild depletion  Patellar Region  Unable to assess  Anterior Thigh Region  Unable to assess  Posterior Calf Region  Unable to assess  Edema (RD Assessment)  None      Diet Order:  Diet NPO time specified Except for: Sips with Meds, Ice Chips  EDUCATION NEEDS:   No education needs have been identified at this time  Skin:  Skin Assessment: Reviewed RN Assessment  Last BM:  Unknown BM date  Height:   Ht Readings from Last 1 Encounters:  08/13/17 _0  (1.651 m)    Weight:   Wt Readings from Last 1 Encounters:  08/13/17 122 lb 9.2 oz (55.6 kg)    Ideal Body Weight:  56.8 kg  BMI:  Body mass index is 20.4 kg/m.  Estimated Nutritional Needs:   Kcal:  1650-1850  Protein:  85-95 grams  Fluid:  >/= 1.6 L/d  Marjo Bicker, MS, RDN, LDN 08/15/2017 11:07 AM

## 2017-08-15 NOTE — Progress Notes (Signed)
[facility] Progress Note   DORENE BRUNI [id number] 14-Sep-1950 67 y.o.  ODETTA FORNESS is managed by Dr. Heath Lark  Actively treated with chemotherapy: yes  Current Therapy: FOLFOX  Last Treated: 08/04/2017 (cycle 2, day 1)  Assessment: Plan:    Lower abdominal pain - Plan: morphine 4 MG/ML injection 2 mg, DG Abd 2 Views, ondansetron (ZOFRAN) injection 4 mg  Nausea and vomiting, intractability of vomiting not specified, unspecified vomiting type - Plan: ondansetron (ZOFRAN) injection 4 mg, DISCONTINUED: ondansetron (ZOFRAN) 4 mg in sodium chloride 0.9 % 50 mL IVPB  Lower abdominal pain, nausea and vomiting in a setting of colon cancer with carcinomatosis: Initially plans have been made to complete a KUB and begin the patient on low-dose Norco with a bowel regiment.  She was given morphine sulfate 2 mg IV when she reported that her abdominal pain was becoming acutely worse.  A decision was made to transport the patient to the emergency room for further evaluation and management.  Please see After Visit Summary for patient specific instructions.  Future Appointments  Date Time Provider [place]  08/18/2017  8:15 AM [facility] LAB 1 [facility] None  08/18/2017  8:30 AM [facility] FLUSH NURSE [facility] None  08/18/2017  9:00 AM Heath Lark, MD [facility] None  08/18/2017 10:00 AM [facility] I26 DNS [facility] None  08/20/2017  3:00 PM [facility] FLUSH NURSE [facility] None  09/01/2017  8:15 AM [facility] LAB 3 [facility] None  09/01/2017  8:30 AM [facility] FLUSH NURSE 2 [facility] None  09/01/2017  9:00 AM Gorsuch, Ni, MD [facility] None  09/01/2017 10:00 AM [facility] F19 [facility] None  09/03/2017  2:00 PM [facility] FLUSH NURSE 2 [facility] None    Orders Placed This Encounter  Procedures  . DG Abd 2 Views       Subjective:   Patient ID:  PETRINA MELBY is a 67 y.o. (DOB [birth year]) female.  Chief Complaint:  Chief Complaint    Patient presents with  . Pain    intermittent abdominal pain  . Fatigue  . Nausea  . Emesis    violent vomiting on Sunday    HPI KESSA FAIRBAIRN is a 67 year old female with a history of colon cancer with peritoneal carcinomatosis.  She presents to the office today status post cycle 2, day 1 of FOLFOX which was dosed on 08/04/2017.  She reports that through last Saturday she was feeling well.  On getting out of the tub on Saturday evening she noted an acute right inguinal pain.  On Sunday she had a significant period of nausea and vomiting.  She also had an episode of presyncope and laid on the bathroom for.  She stated that she passed out briefly but then felt better after she woke up.  She has been having abdominal cramping for the past several days along with abdominal pressure and increase bowel sounds.  She had a bowel movement yesterday.  She began Bentyl today for her abdominal pain was some mild improvement.  She has had nausea  despite her continued use of Zofran and Compazine.  Medications: I have reviewed the patient's current medications.  Allergies:  Allergies  Allergen Reactions  . Flagyl [Metronidazole] Nausea Only    Past Medical History:  Diagnosis Date  . Anxiety   . 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 CAD (coronary artery disease)    a. Inf STEMI/LHC 09/2016 which showed occluded mid LCx treated with PCI, DES; remaining  cath details included a proximal to mid LAD lesion of 20% stenosis, and proximal to mid RCA 25% stenosis. EF 55-60%.  . Depression   . Diverticulosis   . Gallstones   . Ischemic cardiomyopathy    a. 2D Echo 09/28/16 showed EF 55-60%, probable severe hypokinesis of the entire inferiormyocardium, normal diastolic parameters.  . Myocardial infarction ([place]) 09/26/2016  . Pneumonia   . Presence of tooth-root and mandibular implants   . Tobacco abuse   . Wears glasses     Past Surgical History:  Procedure Laterality Date  . CERVICAL  CONIZATION W/BX N/A 01/28/2017   Procedure: CONIZATION CERVIX WITH BIOPSY;  Surgeon: [name], MD;  Location: [facility];  Service: Gynecology;  Laterality: N/A;  . CHOLECYSTECTOMY    . CORONARY STENT INTERVENTION N/A 09/26/2016   Procedure: Coronary Stent Intervention;  Surgeon: Belva Crome, MD;  Location: [facility];  Service: Cardiovascular;  Laterality: N/A;  . DILATION AND CURETTAGE OF UTERUS N/A 01/28/2017   Procedure: DILATATION AND CURETTAGE WITH ULTRASOUND GUIDENCE;  Surgeon: [name], MD;  Location: [facility];  Service: Gynecology;  Laterality: N/A;  . LEFT HEART CATH AND CORONARY ANGIOGRAPHY N/A 09/26/2016   Procedure: Left Heart Cath and Coronary Angiography;  Surgeon: Belva Crome, MD;  Location: [facility];  Service: Cardiovascular;  Laterality: N/A;    Family History  Problem Relation Age of Onset  . Cancer Brother        MELANOMA  . Heart disease Father   . Stroke Father   . Heart disease Brother   . Diabetes Brother   . Heart attack Brother   . Heart failure Brother     Social History   Socioeconomic History  . Marital status: Divorced    Spouse name: Not on file  . Number of children: 0  . Years of education: Not on file  . Highest education level: Not on file  Occupational History  . Occupation: retired  Scientific laboratory technician  . Financial resource strain: Not on file  . Food insecurity:    Worry: Not on file    Inability: Not on file  . Transportation needs:    Medical: Not on file    Non-medical: Not on file  Tobacco Use  . Smoking status: Former Smoker    Packs/day: 1.00    Years: 35.00    Pack years: 35.00    Types: Cigarettes    Last attempt to quit: 09/26/2016    Years since quitting: 0.8  . Smokeless tobacco: Never Used  Substance and Sexual Activity  . Alcohol use: Yes    Comment: occasional  . Drug use: No  . Sexual activity: Never  Lifestyle  . Physical activity:    Days per week: Not on  file    Minutes per session: Not on file  . Stress: Not on file  Relationships  . Social connections:    Talks on phone: Not on file    Gets together: Not on file    Attends religious service: Not on file    Active member of club or organization: Not on file    Attends meetings of clubs or organizations: Not on file    Relationship status: Not on file  . Intimate partner violence:    Fear of current or ex partner: Not on file    Emotionally abused: Not on file    Physically abused: Not on file    Forced sexual  activity: Not on file  Other Topics Concern  . Not on file  Social History Narrative  . Not on file    Past Medical History, Surgical history, Social history, and Family history were reviewed and updated as appropriate.   Please see review of systems for further details on the patient's review from today.   Review of Systems:  Review of Systems  Constitutional: Negative for appetite change, chills, diaphoresis and fever.  HENT: Negative for trouble swallowing.   Respiratory: Negative for chest tightness and shortness of breath.   Cardiovascular: Negative for chest pain, palpitations and leg swelling.  Gastrointestinal: Positive for abdominal pain, nausea and vomiting. Negative for abdominal distention, blood in stool, constipation and diarrhea.  Neurological: Positive for syncope.    Objective:   Physical Exam:  BP 130/87 (BP Location: Left Arm, Patient Position: Sitting)   Pulse (!) 103   Temp 97.8 F (36.6 C) (Oral)   Resp 18   Ht 5\' 5"  (1.651 m)   Wt 120 lb 8 oz (54.7 kg)   SpO2 99%   BMI 20.05 kg/m  ECOG: 1  Physical Exam  Constitutional: No distress.  HENT:  Head: Normocephalic and atraumatic.  Mouth/Throat: No oropharyngeal exudate.  Neck: Normal range of motion. Neck supple.  Cardiovascular: Normal rate, regular rhythm and normal heart sounds. Exam reveals no gallop and no friction rub.  No murmur heard. Pulmonary/Chest: Effort normal and breath  sounds normal. No respiratory distress. She has no wheezes. She has no rales.  Abdominal: Soft. She exhibits no distension and no mass. Bowel sounds are increased. There is tenderness. There is no rebound and no guarding.    Lymphadenopathy:    She has no cervical adenopathy.  Neurological: She is alert. Coordination normal.  Skin: Skin is warm and dry. No rash noted. She is not diaphoretic. No erythema.  Psychiatric: She has a normal mood and affect. Her behavior is normal. Judgment and thought content normal.    Lab Review:     Component Value Date/Time   NA 137 08/15/2017 0410   K 3.7 08/15/2017 0410   CL 103 08/15/2017 0410   CO2 25 08/15/2017 0410   GLUCOSE 95 08/15/2017 0410   BUN 15 08/15/2017 0410   CREATININE 0.63 08/15/2017 0410   CREATININE 0.80 08/13/2017 1343   CALCIUM 8.3 (L) 08/15/2017 0410   PROT 5.4 (L) 08/15/2017 0410   PROT 6.7 11/28/2016 0849   ALBUMIN 2.6 (L) 08/15/2017 0410   ALBUMIN 4.3 11/28/2016 0849   AST 30 08/15/2017 0410   AST 14 08/13/2017 1343   ALT 28 08/15/2017 0410   ALT 13 08/13/2017 1343   ALKPHOS 94 08/15/2017 0410   BILITOT 0.7 08/15/2017 0410   BILITOT 0.7 08/13/2017 1343   GFRNONAA >60 08/15/2017 0410   GFRNONAA >60 08/13/2017 1343   GFRAA >60 08/15/2017 0410   GFRAA >60 08/13/2017 1343       Component Value Date/Time   WBC 6.8 08/15/2017 0410   RBC 3.74 (L) 08/15/2017 0410   HGB 12.1 08/15/2017 0410   HGB 14.0 08/13/2017 1343   HCT 35.4 (L) 08/15/2017 0410   PLT 181 08/15/2017 0410   PLT 236 08/13/2017 1343   MCV 94.7 08/15/2017 0410   MCV 99.2 (A) 12/03/2016 0900   MCH 32.4 08/15/2017 0410   MCHC 34.2 08/15/2017 0410   RDW 12.9 08/15/2017 0410   LYMPHSABS 0.9 08/15/2017 0410   MONOABS 1.0 08/15/2017 0410   EOSABS 0.1 08/15/2017 0410   BASOSABS  0.0 08/15/2017 0410   -------------------------------  Imaging from last 24 hours (if applicable):  Radiology interpretation: Dg Abd 1 View  Result Date:  08/14/2017 CLINICAL DATA:  Abdominal distention and pain EXAM: ABDOMEN - 1 VIEW COMPARISON:  CT abdomen and pelvis of 08/13/2016 FINDINGS: There is still gaseous distention of small bowel loops consistent with a persistent partial small bowel obstruction. Some contrast is noted in the right abdomen possibly within the right colon. Surgical clips are noted in the right upper quadrant from prior cholecystectomy. IMPRESSION: Persistent partial small bowel obstruction. Electronically Signed   By: Ivar Drape M.D.   On: 08/14/2017 10:25   Ct Abdomen Pelvis W Contrast  Result Date: 08/13/2017 CLINICAL DATA:  67 year old female with history of colon cancer presents with nausea and vomiting as well as progressive abdominal pain. Concern for small bowel obstruction. EXAM: CT ABDOMEN AND PELVIS WITH CONTRAST TECHNIQUE: Multidetector CT imaging of the abdomen and pelvis was performed using the standard protocol following bolus administration of intravenous contrast. CONTRAST:  34mL ISOVUE-300 IOPAMIDOL (ISOVUE-300) INJECTION 61%, 140mL ISOVUE-300 IOPAMIDOL (ISOVUE-300) INJECTION 61% COMPARISON:  CT from 07/18/2017 and 07/27/2017 FINDINGS: Lower chest: Normal size heart without pericardial effusion. Pulmonary mass or pulmonary consolidation. No effusion. Hepatobiliary: Status post cholecystectomy. Mild intrahepatic ductal dilatation appears chronic and likely related to reservoir effect cholecystectomy. No choledocholithiasis. No focal liver lesion. Pancreas: No pancreatic mass or pathologic ductal dilatation. Spleen: Normal size spleen without mass. Adrenals/Urinary Tract: Stable mild nodular appearance of the left adrenal apex. Symmetric enhancement of both kidneys with water attenuating 13 mm cyst in the upper pole of the left kidney. No nephrolithiasis nor hydroureteronephrosis. Nondistended urinary bladder without focal mural thickening, mass or calculus. Stomach/Bowel: The stomach is distended with enteric contrast.  No intraluminal mass or focal mural thickening. There are fluid-filled dilated small bowel loops out of proportion to nondistended large bowel, caliber up to 2.7 cm and consistent with changes of high-grade small bowel obstruction. Suspect that the transition zone is within the pelvis given irregularly enhancing masslike opacities within the lower pelvis concerning for changes of possible peritoneal carcinomatosis and/or necrotic adenopathy. The colon is decompressed in appearance. Interval redevelopment of a small to moderate volume of ascites within the upper abdomen with redemonstration of omental edema and thickening. Vascular/Lymphatic: Nonaneurysmal atherosclerotic aorta and branch vessels. Reproductive: Mixed solid and cystic masslike abnormality in the left adnexa possibly of ovarian etiology similar in appearance to prior. Similar in appearance uterus and right adnexa. Other: Masslike abnormality in the right hemipelvis is redemonstrated, margins are ill-defined and difficult to delineate. This may be contributing to the patient's small-bowel obstruction either from adhesion or direct localized involvement of small bowel. Musculoskeletal: No aggressive osseous lesions. Degenerative changes along the dorsal spine. IMPRESSION: 1. The findings within the abdomen and pelvis are similar to that of 07/12/2017. There has been redevelopment of a small to moderate volume of ascites, small bowel dilatation compatible with small bowel obstruction with probable zone of transition in the right hemipelvis secondary to pelvic mass like abnormality that may reflect peritoneal carcinomatosis for localized pelvic necrotic adenopathy. 2. Redemonstration of omental disease with induration and thickening. Electronically Signed   By: Ashley Royalty M.D.   On: 08/13/2017 20:19   Ct Abdomen Pelvis W Contrast  Result Date: 07/27/2017 CLINICAL DATA:  Recently diagnosed with gastric cancer, unable to have surgery. Patient started her  first chemotherapy treatment. Patient states vomiting bile earlier today. EXAM: CT ABDOMEN AND PELVIS WITH CONTRAST TECHNIQUE: Multidetector CT  imaging of the abdomen and pelvis was performed using the standard protocol following bolus administration of intravenous contrast. CONTRAST:  163mL ISOVUE-300 IOPAMIDOL (ISOVUE-300) INJECTION 61% COMPARISON:  July 12, 2017 FINDINGS: Lower chest: No acute abnormality. Hepatobiliary: The liver is normal without focal liver lesion. Patient status post prior cholecystectomy. Mild postsurgical intrahepatic biliary ductal dilatation is identified. The common bile duct is normal. Pancreas: Unremarkable. No pancreatic ductal dilatation or surrounding inflammatory changes. Spleen: Normal in size without focal abnormality. Adrenals/Urinary Tract: Hypertrophy of left adrenal gland is unchanged. The right adrenal gland is normal. Stable left kidney cyst in the upper pole is noted. The kidneys are otherwise unremarkable. There is no hydronephrosis bilaterally. The bladder is normal. Stomach/Bowel: There is wall thickening of the posterior fundal stomach. There is no obstruction of the stomach. There is no small bowel obstruction. No colonic obstruction is identified. The appendix is normal. The sigmoid colon is decompressed thickened bowel wall. Vascular/Lymphatic: Aortic atherosclerosis. No enlarged abdominal or pelvic lymph nodes. Reproductive: Mixed cystic and solid mass in the left ovary is unchanged. The uterus and right ovary are normal. Other: There is interval resolution of previously noted ascites. Omental nodularities of the left lower quadrant and right abdomen are unchanged. Musculoskeletal: No acute or significant osseous findings. Degenerative joint changes of the spine are noted. IMPRESSION: The previously noted ascites in the abdomen and pelvis has resolved. 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 No small bowel or colonic obstruction is  identified. Generalized thickened bowel wall of the sigmoid colon. Mixed cystic and solid mass of the left ovary is unchanged. Status post prior cholecystectomy with mild postsurgical intrahepatic biliary ductal dilatation. Electronically Signed   By: Abelardo Diesel M.D.   On: 07/27/2017 20:29   Dg Abd Acute W/chest  Result Date: 07/27/2017 CLINICAL DATA:  Colorectal cancer.  Nausea and vomiting. EXAM: DG ABDOMEN ACUTE W/ 1V CHEST COMPARISON:  07/14/2017 FINDINGS: The lungs are clear without focal pneumonia, edema, pneumothorax or pleural effusion. The cardiopericardial silhouette is within normal limits for size. Nodular opacity overlying the right lower lung is compatible with a nipple shadow as confirmed on upright abdomen film. Right PICC line tip overlies the mid to distal SVC. Upright film shows no evidence for intraperitoneal free air. Supine film shows no gaseous small bowel dilatation to suggest obstruction. Moderate stool volume seen along the length of the colon. Visualized bony anatomy unremarkable. IMPRESSION: 1. No acute cardiopulmonary findings. 2. No evidence for small bowel obstruction. No intraperitoneal free air. 3. Moderate stool volume. Electronically Signed   By: Misty Stanley M.D.   On: 07/27/2017 18:09   Dg Abd Portable 1v  Result Date: 08/15/2017 CLINICAL DATA:  Abdominal pain.  Small-bowel obstruction. EXAM: PORTABLE ABDOMEN - 1 VIEW COMPARISON:  CT abdomen and pelvis 08/13/2017. Single-view of the abdomen 08/14/2017. FINDINGS: Contrast material seen in the ascending colon. There is persistent gaseous distention of small bowel with loops measuring up to approximately 4.3 cm, unchanged. No new abnormality. IMPRESSION: No change in findings consistent with partial small bowel obstruction. Electronically Signed   By: Inge Rise M.D.   On: 08/15/2017 09:24   Ir Picc Placement Right >5 Yrs Inc Img Guide  Result Date: 07/18/2017 INDICATION: Colon cancer.  Request PICC line  placement for IV therapies. EXAM: RIGHT UPPER EXTREMITY PICC LINE PLACEMENT WITH ULTRASOUND AND FLUOROSCOPIC GUIDANCE MEDICATIONS: None; ANESTHESIA/SEDATION: Moderate Sedation Time:  None The patient was continuously monitored during the procedure by the interventional radiology nurse under my  direct supervision. FLUOROSCOPY TIME:  Fluoroscopy Time: 12 seconds COMPLICATIONS: None immediate. PROCEDURE: The patient was advised of the possible risks and complications and agreed to undergo the procedure. The patient was then brought to the angiographic suite for the procedure. The right arm was prepped with chlorhexidine, draped in the usual sterile fashion using maximum barrier technique (cap and mask, sterile gown, sterile gloves, large sterile sheet, hand hygiene and cutaneous antiseptic). Local anesthesia was attained by infiltration with 1% lidocaine. Ultrasound demonstrated patency of the basilic vein, and this was documented with an image. Under real-time ultrasound guidance, this vein was accessed with a 21 gauge micropuncture needle and image documentation was performed. The needle was exchanged over a guidewire for a peel-away sheath through which a 39 cm 5 [place] single lumen power injectable PICC was advanced, and positioned with its tip at the lower SVC/right atrial junction. Fluoroscopy during the procedure and fluoro spot radiograph confirms appropriate catheter position. The catheter was flushed, secured to the skin, and covered with a sterile dressing. IMPRESSION: Successful placement of a right arm PICC with sonographic and fluoroscopic guidance. The catheter is ready for use. Read by: Ascencion Dike PA-C Electronically Signed   By: Marybelle Killings M.D.   On: 07/18/2017 16:56

## 2017-08-15 NOTE — Progress Notes (Signed)
PROGRESS NOTE    [name]  YHC:[id number] DOB: 08/22/1950 DOA: 08/13/2017 PCP: [name], PA-C   Brief Narrative:  Carol Ferguson is a 67 y.o. female with history of peritoneal carcinomatosis being followed by Oncology [name] and is receiving chemotherapy, CAD status post stenting for ST elevation MI in June 2018 on aspirin and Brilinta presents to the ER because of persistent abdominal pain.  Patient has been having abdominal pain over the last 1 week.  This is a 1 week following the chemotherapy.  Had multiple bouts of vomiting 5 days ago following which patient has not had much food and has not had any further episodes of vomiting.  Last bowel movement was around 3 days ago.  Pain is mostly diffuse and sometimes colicky appearance denies any chest pain or shortness of breath but tender with abdominal pain radiates to her chest.  In the ER patient had CT abdomen and pelvis which show concern for small bowel obstruction General Surgery was consulted for further evaluation and recommendations.  Because there may not be any good surgical options General Surgery recommended Oncology input for evaluation and recommendations.  Assessment & Plan:   Principal Problem:   SBO (small bowel obstruction) (HCC) Active Problems:   CAD (coronary artery disease), native coronary artery   Peritoneal carcinomatosis (HCC)   Colon cancer (HCC)  SBO with a History of peritoneal Carcinomatosis -CT Abd/Pelvis showed The findings within the abdomen and pelvis are similar to that of 07/12/2017. There has been redevelopment of a small to moderate volume of ascites, small bowel dilatation compatible with small bowel obstruction with probable zone of transition in the right hemipelvis secondary to pelvic mass like abnormality that may reflect peritoneal carcinomatosis for localized pelvic necrotic adenopathy. Redemonstration of omental disease with induration and thickening.  -General Surgery  Consulted and appreciate further recommendations  -Patient is kept NPO except medications and sips and will continue for now -Continue with D5 Normal Saline of 100 mL's per hour renewed order -Pain control with IV morphine 2 mg every 3 as needed for severe pain -Continue with antiemetics with Zofran 4 mg p.o./IV as well as 5 mg of IV Compazine every 6 as needed for nausea and vomiting that is refractory to Zofran -Repeat KUB this AM showed No change in findings consistent with partial small bowel obstruction. -Patient states that she has no more nausea and abdominal pain is slightly better however she still not passing any gas having bowel movement -Further recommendations per surgery.   -Plan is to manage conservatively at this time.   -General Surgery is concerned that these issues were recurrent and they do not think there is an operative solution to this however they may consider a venting G-tube -Oncology evaluated and recommended supportive measures for the next few days including n.p.o. IV hydration.  If no spontaneous recovery occurs patient is not optimistic about long-term survival at this and I have recommended palliative care medicine for pain control -Oncology feels her symptoms do not improve in her block she becomes complete their potential care to comfort care along with considering her venting G-tube -Repeat KUB in the a.m.  History of CAD and Ischemic Cardiomyopathy status post stenting in June 2018  -Continue with Aspirin 81 mg p.o. daily, atorvastatin 10 mg p.o. nightly, Brilinta 90 mg p.o. twice daily  Peritoneal Carcinomatosis  -Likely GI in Origin from -Being followed by Oncology [name] currently undergoing chemotherapy with her next dose on 4/29 -Oncology consulted  initiate Dr. Hazeline Junker evaluation.  He discussed the prognosis with the patient in detail and her alcohol dependent clinical course but has recommended palliative care discussion we held along with  palliative options if her symptoms do not improve the next few days -Palliative care consult has been placed -[place] surgeon to follow-up with patient when she is back in town  Depression and Anxiety -Patient takes Fluoxetine 10 mg p.o. every other day and will restart in the a.m. -Resume Lorazepam 0.5 mg every 8 as needed for Anxiety  Hyperglycemia -Likely Reactive -Check Hemoglobin A1c; Last Value was 5.6 in 09/2016 -Continue to treat blood sugars and if consistently elevated will place on a Sensitive sliding scale insulin  Hypomagnesemia  -Patient mag level was 1.6 this a.m. -Replete with 2 g of IV mag sulfate -Continue to monitor and replete as necessary -Repeat mag level in a.m.  DVT prophylaxis: SCDs. Code Status: FULL CODE Family Communication: No family present at bedside Disposition Plan: Remain inpatient for treatment of small bowel obstruction  Consultants:  General Surgery  Medical Oncology Palliative Care Medicine    Procedures: None   Antimicrobials:  Anti-infectives (From admission, onward)   None     Subjective: Seen and examined at bedside and nausea has improved.  Still has not passed any gas or had a bowel movement.  Abdominal pain is slightly better however still very painful.  No chest pain, shortness breath, or vomiting.  Frustrated with very little improvement and understands that palliative care has been consulted for further evaluation and recommendations.   Objective: Vitals:   08/14/17 2035 08/14/17 2359 08/15/17 0549 08/15/17 0550  BP: (!) 109/53 116/73  (!) 115/55  Pulse: 81 71  82  Resp: 18   18  Temp: 98.6 F (37 C)  98.9 F (37.2 C)   TempSrc: Oral  Oral   SpO2: 98% 96%  98%  Weight:      Height:        Intake/Output Summary (Last 24 hours) at 08/15/2017 3875 Last data filed at 08/15/2017 0600 Gross per 24 hour  Intake 1006.67 ml  Output -  Net 1006.67 ml   Filed Weights   08/13/17 2311  Weight: 55.6 kg (122 lb 9.2 oz)    Examination: Physical Exam:  Constitutional: Thin Caucasian female who is currently in no acute distress and appears calm but appears somewhat frustrated as she is not improving Eyes: Sclera anicteric.  Lids and conjunctive are normal ENMT: External ears and nose appear normal.  Grossly normal hearing Neck: Supple with no JVD Respiratory: Diminished to auscultation bilaterally.  No appreciable wheezing, rales, rhonchi.  Normal respiratory effort.  Patient is not tachypneic or using any accessory muscle use Cardiovascular: Regular rate and rhythm.  No murmurs, rubs, gallops.  No lower extremity edema Abdomen: Soft, moderately tender to palpate.  Still slightly distended but not as much as yesterday.  Hypertympanic to percuss.  Bowel sounds diminished x4 GU: Deferred Musculoskeletal: No contractures or cyanosis.  No joint deformities noted Skin: No rashes, lesions on limited skin evaluation.  Warm and dry Neurologic: Cranial nerves II through XII grossly intact with no focal deficits. Psychiatric: Intact judgment and insight.  Patient is awake alert and oriented x3.  Frustrated mood and slightly depressed.  Data Reviewed: I have personally reviewed following labs and imaging studies  CBC: Recent Labs  Lab 08/13/17 1343 08/14/17 0433 08/15/17 0410  WBC 8.4 6.1 6.8  NEUTROABS 6.4  --  4.8  HGB 14.0 12.8 12.1  HCT 40.9 37.2 35.4*  MCV 95.9 95.1 94.7  PLT 236 200 497   Basic Metabolic Panel: Recent Labs  Lab 08/13/17 1343 08/14/17 0433 08/15/17 0410  NA 137 137 137  K 3.6 3.5 3.7  CL 102 104 103  CO2 32* 27 25  GLUCOSE 99 115* 95  BUN 28* 25* 15  CREATININE 0.80 0.79 0.63  CALCIUM 9.1 8.4* 8.3*  MG  --  1.7 1.6*  PHOS  --  3.5 3.1   GFR: Estimated Creatinine Clearance: 58.3 mL/min (by C-G formula based on SCr of 0.63 mg/dL). Liver Function Tests: Recent Labs  Lab 08/13/17 1343 08/15/17 0410  AST 14 30  ALT 13 28  ALKPHOS 86 94  BILITOT 0.7 0.7  PROT 6.6 5.4*   ALBUMIN 3.2* 2.6*   Recent Labs  Lab 08/13/17 1728  LIPASE 39   No results for input(s): AMMONIA in the last 168 hours. Coagulation Profile: No results for input(s): INR, PROTIME in the last 168 hours. Cardiac Enzymes: No results for input(s): CKTOTAL, CKMB, CKMBINDEX, TROPONINI in the last 168 hours. BNP (last 3 results) No results for input(s): PROBNP in the last 8760 hours. HbA1C: No results for input(s): HGBA1C in the last 72 hours. CBG: Recent Labs  Lab 08/14/17 0044 08/14/17 0845 08/14/17 1658 08/14/17 2349 08/15/17 0745  GLUCAP 97 113* 105* 94 85   Lipid Profile: No results for input(s): CHOL, HDL, LDLCALC, TRIG, CHOLHDL, LDLDIRECT in the last 72 hours. Thyroid Function Tests: No results for input(s): TSH, T4TOTAL, FREET4, T3FREE, THYROIDAB in the last 72 hours. Anemia Panel: No results for input(s): VITAMINB12, FOLATE, FERRITIN, TIBC, IRON, RETICCTPCT in the last 72 hours. Sepsis Labs: Recent Labs  Lab 08/13/17 1739  LATICACIDVEN 0.71    Recent Results (from the past 240 hour(s))  Urine culture     Status: Abnormal (Preliminary result)   Collection Time: 08/13/17  7:06 PM  Result Value Ref Range Status   Specimen Description   Final    URINE, CLEAN CATCH Performed at [facility] [address], [place], [place] [address]    Special Requests   Final    NONE Performed at [facility], [place] [address], [place], [place] [address]    Culture (A)  Final    20,000 COLONIES/mL UNIDENTIFIED ORGANISM Performed at [facility], [place] [address] [place], [place] [address]    Report Status PENDING  Incomplete     Radiology Studies: Dg Abd 1 View  Result Date: 08/14/2017 CLINICAL DATA:  Abdominal distention and pain EXAM: ABDOMEN - 1 VIEW COMPARISON:  CT abdomen and pelvis of 08/13/2016 FINDINGS: There is still gaseous distention of small bowel loops consistent with a persistent partial small bowel obstruction. Some  contrast is noted in the right abdomen possibly within the right colon. Surgical clips are noted in the right upper quadrant from prior cholecystectomy. IMPRESSION: Persistent partial small bowel obstruction. Electronically Signed   By: Ivar Drape M.D.   On: 08/14/2017 10:25   Ct Abdomen Pelvis W Contrast  Result Date: 08/13/2017 CLINICAL DATA:  67 year old female with history of colon cancer presents with nausea and vomiting as well as progressive abdominal pain. Concern for small bowel obstruction. EXAM: CT ABDOMEN AND PELVIS WITH CONTRAST TECHNIQUE: Multidetector CT imaging of the abdomen and pelvis was performed using the standard protocol following bolus administration of intravenous contrast. CONTRAST:  58mL ISOVUE-300 IOPAMIDOL (ISOVUE-300) INJECTION 61%, 140mL ISOVUE-300 IOPAMIDOL (ISOVUE-300) INJECTION 61% COMPARISON:  CT from  07/18/2017 and 07/27/2017 FINDINGS: Lower chest: Normal size heart without pericardial effusion. Pulmonary mass or pulmonary consolidation. No effusion. Hepatobiliary: Status post cholecystectomy. Mild intrahepatic ductal dilatation appears chronic and likely related to reservoir effect cholecystectomy. No choledocholithiasis. No focal liver lesion. Pancreas: No pancreatic mass or pathologic ductal dilatation. Spleen: Normal size spleen without mass. Adrenals/Urinary Tract: Stable mild nodular appearance of the left adrenal apex. Symmetric enhancement of both kidneys with water attenuating 13 mm cyst in the upper pole of the left kidney. No nephrolithiasis nor hydroureteronephrosis. Nondistended urinary bladder without focal mural thickening, mass or calculus. Stomach/Bowel: The stomach is distended with enteric contrast. No intraluminal mass or focal mural thickening. There are fluid-filled dilated small bowel loops out of proportion to nondistended large bowel, caliber up to 2.7 cm and consistent with changes of high-grade small bowel obstruction. Suspect that the transition  zone is within the pelvis given irregularly enhancing masslike opacities within the lower pelvis concerning for changes of possible peritoneal carcinomatosis and/or necrotic adenopathy. The colon is decompressed in appearance. Interval redevelopment of a small to moderate volume of ascites within the upper abdomen with redemonstration of omental edema and thickening. Vascular/Lymphatic: Nonaneurysmal atherosclerotic aorta and branch vessels. Reproductive: Mixed solid and cystic masslike abnormality in the left adnexa possibly of ovarian etiology similar in appearance to prior. Similar in appearance uterus and right adnexa. Other: Masslike abnormality in the right hemipelvis is redemonstrated, margins are ill-defined and difficult to delineate. This may be contributing to the patient's small-bowel obstruction either from adhesion or direct localized involvement of small bowel. Musculoskeletal: No aggressive osseous lesions. Degenerative changes along the dorsal spine. IMPRESSION: 1. The findings within the abdomen and pelvis are similar to that of 07/12/2017. There has been redevelopment of a small to moderate volume of ascites, small bowel dilatation compatible with small bowel obstruction with probable zone of transition in the right hemipelvis secondary to pelvic mass like abnormality that may reflect peritoneal carcinomatosis for localized pelvic necrotic adenopathy. 2. Redemonstration of omental disease with induration and thickening. Electronically Signed   By: Ashley Royalty M.D.   On: 08/13/2017 20:19   Scheduled Meds: . aspirin  81 mg Oral Daily  . atorvastatin  10 mg Oral Daily  . mouth rinse  15 mL Mouth Rinse BID  . ticagrelor  90 mg Oral BID   Continuous Infusions:   LOS: 2 days   Kerney Elbe, DO [facility] Pager [phone]  If 7PM-7AM, please contact night-coverage [email or web address] Password [facility] 08/15/2017, 8:29 AM

## 2017-08-15 NOTE — Progress Notes (Signed)
   08/15/17 1522  Clinical Encounter Type  Visited With Patient  Visit Type Initial;Spiritual support  Spiritual Encounters  Spiritual Needs Emotional  Stress Factors  Patient Stress Factors Lack of knowledge;Major life changes   Rounding on Palliative Patients.  Patient was alone and seemed upset.  Stated she did not want to say much now for fear of getting upset and her niece is on the way and does not want to be upset for her.  Indicated she is frustrated that the medical team cannot seem to figure out what is going on and feels like she can't make any decisions without having more information.  Indicated she has lived alone and been independent for so long and now has to rely on others.  She asked a chaplain to come back and see her at another time.  Will pass along the information.  Will follow and support as needed. Chaplain Katherene Ponto

## 2017-08-16 ENCOUNTER — Inpatient Hospital Stay (HOSPITAL_COMMUNITY): Payer: Medicare Other

## 2017-08-16 DIAGNOSIS — Z7189 Other specified counseling: Secondary | ICD-10-CM

## 2017-08-16 LAB — URINE CULTURE

## 2017-08-16 LAB — CBC WITH DIFFERENTIAL/PLATELET
BASOS ABS: 0 10*3/uL (ref 0.0–0.1)
BASOS PCT: 0 %
Eosinophils Absolute: 0.1 10*3/uL (ref 0.0–0.7)
Eosinophils Relative: 1 %
HEMATOCRIT: 35.1 % — AB (ref 36.0–46.0)
Hemoglobin: 12.1 g/dL (ref 12.0–15.0)
Lymphocytes Relative: 14 %
Lymphs Abs: 1.2 10*3/uL (ref 0.7–4.0)
MCH: 32.5 pg (ref 26.0–34.0)
MCHC: 34.5 g/dL (ref 30.0–36.0)
MCV: 94.4 fL (ref 78.0–100.0)
MONOS PCT: 12 %
Monocytes Absolute: 1 10*3/uL (ref 0.1–1.0)
NEUTROS ABS: 6.2 10*3/uL (ref 1.7–7.7)
NEUTROS PCT: 73 %
Platelets: 147 10*3/uL — ABNORMAL LOW (ref 150–400)
RBC: 3.72 MIL/uL — ABNORMAL LOW (ref 3.87–5.11)
RDW: 12.9 % (ref 11.5–15.5)
WBC: 8.5 10*3/uL (ref 4.0–10.5)

## 2017-08-16 LAB — COMPREHENSIVE METABOLIC PANEL
ALBUMIN: 2.5 g/dL — AB (ref 3.5–5.0)
ALK PHOS: 90 U/L (ref 38–126)
ALT: 22 U/L (ref 14–54)
AST: 21 U/L (ref 15–41)
Anion gap: 11 (ref 5–15)
BILIRUBIN TOTAL: 0.9 mg/dL (ref 0.3–1.2)
BUN: 11 mg/dL (ref 6–20)
CALCIUM: 8 mg/dL — AB (ref 8.9–10.3)
CO2: 23 mmol/L (ref 22–32)
CREATININE: 0.61 mg/dL (ref 0.44–1.00)
Chloride: 103 mmol/L (ref 101–111)
GFR calc Af Amer: >60 mL/min (ref >60)
GFR calc non Af Amer: >60 mL/min (ref >60)
GLUCOSE: 101 mg/dL — AB (ref 65–99)
Potassium: 3.2 mmol/L — ABNORMAL LOW (ref 3.5–5.1)
Sodium: 137 mmol/L (ref 135–145)
TOTAL PROTEIN: 5.2 g/dL — AB (ref 6.5–8.1)

## 2017-08-16 LAB — HEMOGLOBIN A1C
HEMOGLOBIN A1C: 5.6 % (ref 4.8–5.6)
Mean Plasma Glucose: 114.02 mg/dL

## 2017-08-16 LAB — GLUCOSE, CAPILLARY
Glucose-Capillary: 113 mg/dL — ABNORMAL HIGH (ref 65–99)
Glucose-Capillary: 83 mg/dL (ref 65–99)
Glucose-Capillary: 95 mg/dL (ref 65–99)

## 2017-08-16 LAB — MAGNESIUM: Magnesium: 1.8 mg/dL (ref 1.7–2.4)

## 2017-08-16 LAB — PHOSPHORUS: Phosphorus: 3.1 mg/dL (ref 2.5–4.6)

## 2017-08-16 MED ORDER — POTASSIUM CHLORIDE 10 MEQ/100ML IV SOLN
INTRAVENOUS | Status: AC
  Filled 2017-08-16: qty 100

## 2017-08-16 MED ORDER — POTASSIUM CHLORIDE 10 MEQ/100ML IV SOLN
10.0000 meq | INTRAVENOUS | Status: AC
  Administered 2017-08-16 (×5): 10 meq via INTRAVENOUS
  Filled 2017-08-16 (×4): qty 100

## 2017-08-16 NOTE — Progress Notes (Addendum)
PROGRESS NOTE    RYNLEE LISBON  XTG:[id number] DOB: 02-Jun-1950 DOA: 08/13/2017 PCP: [name], PA-C   Brief Narrative:  LUCILA KLECKA is a 67 y.o. female with history of peritoneal carcinomatosis being followed by Oncology [name] and is receiving chemotherapy, CAD status post stenting for ST elevation MI in June 2018 on aspirin and Brilinta presents to the ER because of persistent abdominal pain.  Patient has been having abdominal pain over the last 1 week.  This is a 1 week following the chemotherapy.  Had multiple bouts of vomiting 5 days ago following which patient has not had much food and has not had any further episodes of vomiting.  Last bowel movement was around 3 days ago.  Pain is mostly diffuse and sometimes colicky appearance denies any chest pain or shortness of breath but tender with abdominal pain radiates to her chest.  In the ER patient had CT abdomen and pelvis which show concern for small bowel obstruction General Surgery was consulted for further evaluation and recommendations.  Because there may not be any good surgical options General Surgery recommended Oncology input for evaluation and recommendations.  Oncology evaluated and feel that if she does not improve from this or her partial small bowel obstruction becomes complete, comfort cares would potentially be necessitated as well as considering a venting G-tube.  Palliative care has been involved and commending continuing current management with conservative measures.  Assessment & Plan:   Principal Problem:   SBO (small bowel obstruction) (HCC) Active Problems:   CAD (coronary artery disease), native coronary artery   Peritoneal carcinomatosis (HCC)   Colon cancer ([place])   Palliative care encounter   Protein-calorie malnutrition, severe  SBO with a History of peritoneal Carcinomatosis -CT Abd/Pelvis showed The findings within the abdomen and pelvis are similar to that of 07/12/2017. There has been  redevelopment of a small to moderate volume of ascites, small bowel dilatation compatible with small bowel obstruction with probable zone of transition in the right hemipelvis secondary to pelvic mass like abnormality that may reflect peritoneal carcinomatosis for localized pelvic necrotic adenopathy. Redemonstration of omental disease with induration and thickening.  -General Surgery Consulted and appreciate further recommendations  -Patient is kept NPO except medications and sips and will continue for now -Continue with D5 Normal Saline of 100 mL's -Pain control with IV morphine 2 mg every 3 as needed for severe pain -Continue with Antiemetics with Zofran 4 mg p.o./IV as well as 5 mg of IV Compazine every 6 as needed for nausea and vomiting that is refractory to Zofran -KUB this AM showed Unchanged mildly dilated small bowel loops in the central abdomen. Contrast is again noted within the cecum. No acute osseous abnormality. -Patient states that she had mild nausea this AM and abdominal pain getting better but she still not passing any gas having bowel movement -Plan is to manage conservatively at this time.   -General Surgery is concerned that these issues were recurrent and they do not think there is an operative solution to this however they may consider a venting G-tube for palliation -Oncology evaluated and recommended supportive measures for the next few days including n.p.o. IV hydration.  If no spontaneous recovery occurs patient is not optimistic about long-term survival at this and I have recommended palliative care medicine for pain control -Oncology feels her symptoms do not improve or her bowel obstruction becomes complete then potentially proceeding with comfort measures would be the best option and considering a venting  G-tube.  However she does improve and can take oral nutrition chemotherapy may further palliate her disease -Repeat KUB in the AM -Positive care consulted for further  evaluation recommendations.  At this time the recommend continuing conservative management of the SBO as well continue as needed morphine and antiemetics  History of CAD and Ischemic Cardiomyopathy status post stenting in June 2018  -Continue with Aspirin 81 mg p.o. daily, Atorvastatin 10 mg p.o. nightly, Brilinta 90 mg p.o. twice daily  Peritoneal Carcinomatosis  -Likely GI in Origin from -Being followed by Oncology [name] currently undergoing chemotherapy with her next dose on 4/29 -Oncology consulted and Appreciate Dr. Hazeline Junker evaluation.  He discussed the prognosis with the patient in detail and her alcohol dependent clinical course but has recommended palliative care discussion we held along with palliative options if her symptoms do not improve the next few days -Palliative care consult has been placed; at this time they recommend continuing conservative management of the small bowel obstruction as well as continue IV pain medications with morphine -Primary Oncologist [name] to follow-up with patient when she is back in town - Depression and Anxiety -Patient takes Fluoxetine 10 mg p.o. every other day and will restart in the a.m. -Resume Lorazepam 0.5 mg every 8 as needed for Anxiety  Hyperglycemia -Likely Reactive -Checked Hemoglobin A1c and was 5.6 -Continue to treat blood sugars and if consistently elevated will place on a Sensitive sliding scale insulin  Hypomagnesemia  -Patient mag level was 1.8 this AM -Replete with 2 g of IV mag sulfate again to get close to 2.0 -Continue to monitor and replete as necessary -Repeat mag level in a.m.  Hypokalemia -Patient's potassium level was 3.2 -Replete with IV KCl 40 mEq as patient is n.p.o. Except meds and sips -Continue to monitor and replete as necessary  -Repeat CMP in AM  Severe protein calorie malnutrition in the context of chronic illness. -Patient remains n.p.o. at this time and goals of care discussions will be  had if patient does not improve significantly.  May goal comfort measures however she does not wish palliation would initiate TPN for nutrition supplementation  DVT prophylaxis: SCDs. Code Status: FULL CODE Family Communication: No family present at bedside Disposition Plan: Remain inpatient for treatment of small bowel obstruction  Consultants:  General Surgery  Medical Oncology Palliative Care Medicine    Procedures: None   Antimicrobials:  Anti-infectives (From admission, onward)   None     Subjective: Seen and examined at bedside and remains frustrated.  Still not passing any gas or having a bowel movement. States she had a little bit of nausea this morning to improve with antiemetics.  Abdominal pain has improved significantly since he came in however she still has some.  No chest pain, shortness of breath, lightheadedness, or dizziness.  Objective: Vitals:   08/15/17 1430 08/15/17 2104 08/16/17 0451 08/16/17 1545  BP: (!) 116/58 (!) 105/53 (!) 110/57 (!) 114/56  Pulse: 86 86 87 92  Resp: 18 18 20 17   Temp: 98.7 F (37.1 C) 99.6 F (37.6 C) (!) 97.3 F (36.3 C) 97.6 F (36.4 C)  TempSrc: Oral Oral Oral   SpO2: 98% 97% 94% 98%  Weight:      Height:        Intake/Output Summary (Last 24 hours) at 08/16/2017 1643 Last data filed at 08/16/2017 1600 Gross per 24 hour  Intake 1100 ml  Output 2 ml  Net 1098 ml   Filed Weights   08/13/17  2311  Weight: 55.6 kg (122 lb 9.2 oz)   Examination: Physical Exam:  Constitutional: Thin Caucasian female who is currently in no acute distress.  Appears calm but remains frustrated she is not improving and states she will make need to make a decision about palliation if she does not improve Eyes: Sclera anicteric.  Lids and conjunctive are normal ENMT: Ears and nose appear normal.  Grossly normal hearing Neck: Supple with no JVD Respiratory: Diminished to auscultation bilaterally.  No appreciable wheezing, rales, rhonchi.   Patient is not tachypneic or using any accessory muscles to breathe Cardiovascular: Regular rate and rhythm.  No appreciable murmurs, rubs, gallops.  No lower extremity edema Abdomen: Soft, somewhat tender to palpate but not as much as yesterday.  Distended slightly.  Remains hypertympanic to percussion.  Bowel sounds diminished x4 GU: Deferred Musculoskeletal: No contractures or cyanosis.  No joint deformities noted Skin: Skin is warm and dry with no appreciable rashes or lesions limited skin evaluation Neurologic: Cranial nerves II through XII grossly intact with no appreciable focal deficits Psychiatric: Intact judgment and insight.  Patient is awake and alert and oriented x3.  Patient remains frustrated and appears depressed.  Data Reviewed: I have personally reviewed following labs and imaging studies  CBC: Recent Labs  Lab 08/13/17 1343 08/14/17 0433 08/15/17 0410 08/16/17 0344  WBC 8.4 6.1 6.8 8.5  NEUTROABS 6.4  --  4.8 6.2  HGB 14.0 12.8 12.1 12.1  HCT 40.9 37.2 35.4* 35.1*  MCV 95.9 95.1 94.7 94.4  PLT 236 200 181 601*   Basic Metabolic Panel: Recent Labs  Lab 08/13/17 1343 08/14/17 0433 08/15/17 0410 08/16/17 0344  NA 137 137 137 137  K 3.6 3.5 3.7 3.2*  CL 102 104 103 103  CO2 32* 27 25 23   GLUCOSE 99 115* 95 101*  BUN 28* 25* 15 11  CREATININE 0.80 0.79 0.63 0.61  CALCIUM 9.1 8.4* 8.3* 8.0*  MG  --  1.7 1.6* 1.8  PHOS  --  3.5 3.1 3.1   GFR: Estimated Creatinine Clearance: 58.3 mL/min (by C-G formula based on SCr of 0.61 mg/dL). Liver Function Tests: Recent Labs  Lab 08/13/17 1343 08/15/17 0410 08/16/17 0344  AST 14 30 21   ALT 13 28 22   ALKPHOS 86 94 90  BILITOT 0.7 0.7 0.9  PROT 6.6 5.4* 5.2*  ALBUMIN 3.2* 2.6* 2.5*   Recent Labs  Lab 08/13/17 1728  LIPASE 39   No results for input(s): AMMONIA in the last 168 hours. Coagulation Profile: No results for input(s): INR, PROTIME in the last 168 hours. Cardiac Enzymes: No results for  input(s): CKTOTAL, CKMB, CKMBINDEX, TROPONINI in the last 168 hours. BNP (last 3 results) No results for input(s): PROBNP in the last 8760 hours. HbA1C: Recent Labs    08/16/17 0344  HGBA1C 5.6   CBG: Recent Labs  Lab 08/15/17 0745 08/15/17 1651 08/16/17 0002 08/16/17 0753 08/16/17 1614  GLUCAP 85 93 113* 95 83   Lipid Profile: No results for input(s): CHOL, HDL, LDLCALC, TRIG, CHOLHDL, LDLDIRECT in the last 72 hours. Thyroid Function Tests: No results for input(s): TSH, T4TOTAL, FREET4, T3FREE, THYROIDAB in the last 72 hours. Anemia Panel: No results for input(s): VITAMINB12, FOLATE, FERRITIN, TIBC, IRON, RETICCTPCT in the last 72 hours. Sepsis Labs: Recent Labs  Lab 08/13/17 1739  LATICACIDVEN 0.71    Recent Results (from the past 240 hour(s))  Urine culture     Status: Abnormal   Collection Time: 08/13/17  7:06 PM  Result Value Ref Range Status   Specimen Description   Final    URINE, CLEAN CATCH Performed at [facility], [place] [address] [place], [place] [address]    Special Requests   Final    NONE Performed at [facility], [place] [address] [place], [place] [address]    Culture 20,000 COLONIES/mL ENTEROCOCCUS FAECALIS (A)  Final   Report Status 08/16/2017 FINAL  Final   Organism ID, Bacteria ENTEROCOCCUS FAECALIS (A)  Final      Susceptibility   Enterococcus faecalis - MIC*    AMPICILLIN <=2 SENSITIVE Sensitive     LEVOFLOXACIN 2 SENSITIVE Sensitive     NITROFURANTOIN <=16 SENSITIVE Sensitive     VANCOMYCIN 2 SENSITIVE Sensitive     * 20,000 COLONIES/mL ENTEROCOCCUS FAECALIS    Radiology Studies: Dg Abd Portable 1v  Result Date: 08/16/2017 CLINICAL DATA:  Small bowel obstruction. EXAM: PORTABLE ABDOMEN - 1 VIEW COMPARISON:  Abdominal x-ray from yesterday. FINDINGS: Unchanged mildly dilated small bowel loops in the central abdomen. Contrast is again noted within the cecum. No acute osseous abnormality. IMPRESSION: 1.  Unchanged small bowel obstruction. Electronically Signed   By: Titus Dubin M.D.   On: 08/16/2017 09:04   Dg Abd Portable 1v  Result Date: 08/15/2017 CLINICAL DATA:  Abdominal pain.  Small-bowel obstruction. EXAM: PORTABLE ABDOMEN - 1 VIEW COMPARISON:  CT abdomen and pelvis 08/13/2017. Single-view of the abdomen 08/14/2017. FINDINGS: Contrast material seen in the ascending colon. There is persistent gaseous distention of small bowel with loops measuring up to approximately 4.3 cm, unchanged. No new abnormality. IMPRESSION: No change in findings consistent with partial small bowel obstruction. Electronically Signed   By: Inge Rise M.D.   On: 08/15/2017 09:24   Scheduled Meds: . aspirin  81 mg Oral Daily  . atorvastatin  10 mg Oral Daily  . mouth rinse  15 mL Mouth Rinse BID  . ticagrelor  90 mg Oral BID   Continuous Infusions: . dextrose 5 % and 0.9% NaCl 100 mL/hr at 08/16/17 0804    LOS: 3 days   Kerney Elbe, DO [facility] Pager [phone]  If 7PM-7AM, please contact night-coverage [email or web address] Password [email or web address] 08/16/2017, 4:43 PM

## 2017-08-16 NOTE — Consult Note (Signed)
Consultation Note Date: 08/16/2017   Patient Name: Carol Ferguson  DOB: [birth year]  MRN: [id number]  Age / Sex: 67 y.o., female  PCP: [name], PA-C Referring Physician: Kerney Elbe, DO  Reason for Consultation: Establishing goals of care  HPI/Patient Profile: 67 y.o. female  with past medical history of peritoneal carcinomatosis (dx 06/2017) being treated with FOLFOX (last dose 08/06/17), h/o bowel obstruction, peripheral neuropathy from chemotherapy, CAD with h/o STEMI s/p DES to LCx (09/2016), chronic nausea, mild protein-calorie malnutrion admitted on 08/13/2017 with SBO. Patient has been evaluated by surgery and oncology and felt not to be a surgical candidate. She is felt to have a poor prognosis in the event that SBO does not resolve with conservative management. Palliative care has been asked to consult to help address goals of care. Today, I met with patient and her nephew.   Clinical Assessment and Goals of Care: Patient says she is feeling mildly improved today with less pain and nausea. However, she has continued to have pain with any po intake and is being tx'd with IV morphine. No flatus or BM to date. CXR pending.   We had a lengthy conversation about patient's current clinical status and possible outcomes. She seems very realistic that the SBO might not resolve and she wanted to discuss end of life care and decisions. She was tearful discussing this but says she is not afraid. Her primary concern seems to be that she will be comfortable, pain free, and that her niece and nephew will be supported in the event of clinical decline. We talked about the possible option of hospice care and she was receptive to this. Patient would not want to return home if it were her end of life and would prefer instead to transfer to a facility for residential hospice. She says that her brother (the father of  her niece and nephew) was cared for at [facility] and that she would also prefer her end of life to be at that location if possible.   Patient has previously completed advance directives and named her niece and nephew as her HCPOAs. She says that she does not want to be resuscitated or have her life prolonged artificially. She wants to be a DNR.   Prior to this hospitalization, patient was living at home alone in a townhouse. She is divorced and has no children. Her niece and nephew are very involved in her care.  SUMMARY OF RECOMMENDATIONS   1. Continue conservative management of SBO.  2. Continue prn morphine and antiemetics 3. DNR 4. Chaplain for emotional support 5. Will follow     Primary Diagnoses: Present on Admission: . SBO (small bowel obstruction) ([place]) . Peritoneal carcinomatosis ([place]) . Colon cancer ([place]) . CAD (coronary artery disease), native coronary artery   I have reviewed the medical record, interviewed the patient and family, and examined the patient. The following aspects are pertinent.  Past Medical History:  Diagnosis Date  . Anxiety   . Arthritis    DDD  in neck and lower back  . CAD (coronary artery disease)    a. Inf STEMI/LHC 09/2016 which showed occluded mid LCx treated with PCI, DES; remaining cath details included a proximal to mid LAD lesion of 20% stenosis, and proximal to mid RCA 25% stenosis. EF 55-60%.  . Depression   . Diverticulosis   . Gallstones   . Ischemic cardiomyopathy    a. 2D Echo 09/28/16 showed EF 55-60%, probable severe hypokinesis of the entire inferiormyocardium, normal diastolic parameters.  . Myocardial infarction ([place]) 09/26/2016  . Pneumonia   . Presence of tooth-root and mandibular implants   . Tobacco abuse   . Wears glasses    Social History   Socioeconomic History  . Marital status: Divorced    Spouse name: Not on file  . Number of children: 0  . Years of education: Not on file  . Highest education level: Not on  file  Occupational History  . Occupation: retired  Scientific laboratory technician  . Financial resource strain: Not on file  . Food insecurity:    Worry: Not on file    Inability: Not on file  . Transportation needs:    Medical: Not on file    Non-medical: Not on file  Tobacco Use  . Smoking status: Former Smoker    Packs/day: 1.00    Years: 35.00    Pack years: 35.00    Types: Cigarettes    Last attempt to quit: 09/26/2016    Years since quitting: 0.8  . Smokeless tobacco: Never Used  Substance and Sexual Activity  . Alcohol use: Yes    Comment: occasional  . Drug use: No  . Sexual activity: Never  Lifestyle  . Physical activity:    Days per week: Not on file    Minutes per session: Not on file  . Stress: Not on file  Relationships  . Social connections:    Talks on phone: Not on file    Gets together: Not on file    Attends religious service: Not on file    Active member of club or organization: Not on file    Attends meetings of clubs or organizations: Not on file    Relationship status: Not on file  Other Topics Concern  . Not on file  Social History Narrative  . Not on file   Family History  Problem Relation Age of Onset  . Cancer Brother        MELANOMA  . Heart disease Father   . Stroke Father   . Heart disease Brother   . Diabetes Brother   . Heart attack Brother   . Heart failure Brother    Scheduled Meds: . aspirin  81 mg Oral Daily  . atorvastatin  10 mg Oral Daily  . mouth rinse  15 mL Mouth Rinse BID  . ticagrelor  90 mg Oral BID   Continuous Infusions: . dextrose 5 % and 0.9% NaCl 100 mL/hr at 08/16/17 0804  . potassium chloride     PRN Meds:.acetaminophen **OR** acetaminophen, LORazepam, morphine injection, nitroGLYCERIN, ondansetron **OR** ondansetron (ZOFRAN) IV, prochlorperazine, sodium chloride flush Medications Prior to Admission:  Prior to Admission medications   Medication Sig Start Date End Date Taking? Authorizing Provider  aspirin 81 MG  chewable tablet Chew 1 tablet (81 mg total) by mouth daily. 09/28/16  Yes Dunn, Areta Haber, PA-C  atorvastatin (LIPITOR) 10 MG tablet Take 1 tablet (10 mg total) by mouth daily. 12/02/16 07/12/24 Yes Belva Crome, MD  BRILINTA 90 MG TABS tablet TAKE 1 TABLET BY MOUTH TWICE A DAY 10/25/16  Yes Dunn, Dayna N, PA-C  dicyclomine (BENTYL) 10 MG capsule TAKE 1 CAPSULE BY MOUTH EVERY 8 HOURS AS NEEDED FOR SPASMS. FOR ABDOMINAL DISCOMFORT 06/20/17  Yes Nandigam, Venia Minks, MD  FLUoxetine (PROZAC) 10 MG capsule TAKE 1 CAPSULE BY MOUTH EVERY DAY Patient taking differently: TAKE 1 CAPSULE BY MOUTH EVERY other DAY 07/22/17  Yes Timmothy Euler, [place] D, PA-C  LORazepam (ATIVAN) 0.5 MG tablet Take 1 tablet (0.5 mg total) by mouth every 8 (eight) hours as needed for anxiety. Patient taking differently: Take 0.5 mg by mouth every 8 (eight) hours as needed for anxiety (nausea).  08/04/17  Yes Heath Lark, MD  magic mouthwash w/lidocaine SOLN Take 5 mLs by mouth 4 (four) times daily. Swish and spit. Patient taking differently: Take 5 mLs by mouth 4 (four) times daily as needed for mouth pain. Swish and spit. 07/24/17  Yes Gorsuch, Ni, MD  ondansetron (ZOFRAN) 8 MG tablet Take 1 tablet (8 mg total) by mouth every 8 (eight) hours as needed for refractory nausea / vomiting. Start on day 3 after chemotherapy. 07/16/17  Yes Heath Lark, MD  prochlorperazine (COMPAZINE) 10 MG tablet Take 1 tablet (10 mg total) by mouth every 6 (six) hours as needed (Nausea or vomiting). 07/16/17  Yes Heath Lark, MD  traZODone (DESYREL) 50 MG tablet Take 0.5-1 tablets (25-50 mg total) at bedtime as needed by mouth for sleep. Patient taking differently: Take 25 mg by mouth at bedtime.  03/06/17  Yes Timmothy Euler, [place] D, PA-C  glycerin adult 2 g suppository Place 1 suppository rectally as needed for constipation. Patient not taking: Reported on 08/13/2017 07/27/17   Tanna Furry, MD  glycerin adult 2 g suppository Place 1 suppository rectally as needed for  constipation. Patient not taking: Reported on 08/13/2017 07/27/17   Tanna Furry, MD  HYDROcodone-acetaminophen Brand [facility]) 5-325 MG tablet Take 1 tablet by mouth every 4 (four) hours as needed for moderate pain. 08/13/17   Tanner, Lyndon Code., PA-C  nitroGLYCERIN (NITROSTAT) 0.4 MG SL tablet Place 1 tablet (0.4 mg total) under the tongue every 5 (five) minutes x 3 doses as needed for chest pain. 09/28/16   Rise Mu, PA-C   Allergies  Allergen Reactions  . Flagyl [Metronidazole] Nausea Only   Review of Systems  Gastrointestinal: Positive for abdominal pain and nausea.  All other systems reviewed and are negative.   Physical Exam  Constitutional: She is oriented to person, place, and time.  NAD, lying in bed  Cardiovascular: Normal rate and regular rhythm.  Pulmonary/Chest: Effort normal and breath sounds normal.  Abdominal: Bowel sounds are decreased. There is generalized tenderness.  Neurological: She is alert and oriented to person, place, and time.  Skin: Skin is warm and dry.    Vital Signs: BP (!) 110/57 (BP Location: Left Arm)   Pulse 87   Temp (!) 97.3 F (36.3 C) (Oral)   Resp 20   Ht 5' 5"  (1.651 m)   Wt 55.6 kg (122 lb 9.2 oz)   SpO2 94%   BMI 20.40 kg/m  Pain Scale: 0-10   Pain Score: 5    SpO2: SpO2: 94 % O2 Device:SpO2: 94 % O2 Flow Rate: .   IO: Intake/output summary:   Intake/Output Summary (Last 24 hours) at 08/16/2017 0907 Last data filed at 08/16/2017 0200 Gross per 24 hour  Intake 1550 ml  Output -  Net 1550 ml  LBM:   Baseline Weight: Weight: 55.6 kg (122 lb 9.2 oz) Most recent weight: Weight: 55.6 kg (122 lb 9.2 oz)     Palliative Assessment/Data:   Flowsheet Rows     Most Recent Value  Intake Tab  Referral Department  Hospitalist  Unit at Time of Referral  Oncology Unit  Date Notified  08/14/17  Palliative Care Type  New Palliative care  Reason for referral  Advance Care Planning, Pain  Date of Admission  08/13/17  Date first seen by  Palliative Care  08/16/17  # of days Palliative referral response time  2 Day(s)  # of days IP prior to Palliative referral  1  Clinical Assessment  Palliative Performance Scale Score  30%  Psychosocial & Spiritual Assessment  Palliative Care Outcomes  Patient/Family meeting held?  Yes  Who was at the meeting?  patient and nephew  Palliative Care Outcomes  Improved pain interventions, Clarified goals of care      Time In: 0800 Time Out: 0900 Time Total: 60 minutes Greater than 50%  of this time was spent counseling and coordinating care related to the above assessment and plan.  Signed by: Irean Hong, NP   Please contact Palliative Medicine Team phone at [phone] for questions and concerns.  For individual provider: See Shea Evans

## 2017-08-17 ENCOUNTER — Inpatient Hospital Stay (HOSPITAL_COMMUNITY): Payer: Medicare Other

## 2017-08-17 DIAGNOSIS — Z515 Encounter for palliative care: Secondary | ICD-10-CM

## 2017-08-17 DIAGNOSIS — F411 Generalized anxiety disorder: Secondary | ICD-10-CM

## 2017-08-17 DIAGNOSIS — G893 Neoplasm related pain (acute) (chronic): Secondary | ICD-10-CM

## 2017-08-17 DIAGNOSIS — E43 Unspecified severe protein-calorie malnutrition: Secondary | ICD-10-CM

## 2017-08-17 DIAGNOSIS — C801 Malignant (primary) neoplasm, unspecified: Secondary | ICD-10-CM

## 2017-08-17 DIAGNOSIS — R110 Nausea: Secondary | ICD-10-CM

## 2017-08-17 LAB — COMPREHENSIVE METABOLIC PANEL
ALBUMIN: 2.5 g/dL — AB (ref 3.5–5.0)
ALK PHOS: 81 U/L (ref 38–126)
ALT: 20 U/L (ref 14–54)
AST: 21 U/L (ref 15–41)
Anion gap: 8 (ref 5–15)
BILIRUBIN TOTAL: 0.7 mg/dL (ref 0.3–1.2)
BUN: 8 mg/dL (ref 6–20)
CO2: 25 mmol/L (ref 22–32)
Calcium: 8.2 mg/dL — ABNORMAL LOW (ref 8.9–10.3)
Chloride: 103 mmol/L (ref 101–111)
Creatinine, Ser: 0.67 mg/dL (ref 0.44–1.00)
GFR calc Af Amer: >60 mL/min (ref >60)
GLUCOSE: 111 mg/dL — AB (ref 65–99)
POTASSIUM: 3.5 mmol/L (ref 3.5–5.1)
Sodium: 136 mmol/L (ref 135–145)
TOTAL PROTEIN: 5.5 g/dL — AB (ref 6.5–8.1)

## 2017-08-17 LAB — CBC WITH DIFFERENTIAL/PLATELET
BASOS ABS: 0 10*3/uL (ref 0.0–0.1)
BASOS PCT: 0 %
Eosinophils Absolute: 0.1 10*3/uL (ref 0.0–0.7)
Eosinophils Relative: 2 %
HEMATOCRIT: 34.5 % — AB (ref 36.0–46.0)
HEMOGLOBIN: 12 g/dL (ref 12.0–15.0)
LYMPHS PCT: 13 %
Lymphs Abs: 0.8 10*3/uL (ref 0.7–4.0)
MCH: 32.6 pg (ref 26.0–34.0)
MCHC: 34.8 g/dL (ref 30.0–36.0)
MCV: 93.8 fL (ref 78.0–100.0)
MONO ABS: 1.1 10*3/uL — AB (ref 0.1–1.0)
Monocytes Relative: 18 %
NEUTROS ABS: 4.1 10*3/uL (ref 1.7–7.7)
Neutrophils Relative %: 67 %
Platelets: 154 10*3/uL (ref 150–400)
RBC: 3.68 MIL/uL — AB (ref 3.87–5.11)
RDW: 13 % (ref 11.5–15.5)
WBC: 6.1 10*3/uL (ref 4.0–10.5)

## 2017-08-17 LAB — MAGNESIUM: MAGNESIUM: 1.6 mg/dL — AB (ref 1.7–2.4)

## 2017-08-17 LAB — GLUCOSE, CAPILLARY
GLUCOSE-CAPILLARY: 123 mg/dL — AB (ref 65–99)
Glucose-Capillary: 107 mg/dL — ABNORMAL HIGH (ref 65–99)
Glucose-Capillary: 117 mg/dL — ABNORMAL HIGH (ref 65–99)

## 2017-08-17 LAB — PHOSPHORUS: Phosphorus: 3 mg/dL (ref 2.5–4.6)

## 2017-08-17 MED ORDER — ONDANSETRON HCL 4 MG/2ML IJ SOLN
4.0000 mg | INTRAMUSCULAR | Status: DC
  Administered 2017-08-17 – 2017-08-18 (×5): 4 mg via INTRAVENOUS
  Filled 2017-08-17 (×5): qty 2

## 2017-08-17 MED ORDER — POTASSIUM CHLORIDE 10 MEQ/100ML IV SOLN
INTRAVENOUS | Status: AC
  Administered 2017-08-17: 10 meq
  Filled 2017-08-17: qty 100

## 2017-08-17 MED ORDER — DIPHENHYDRAMINE HCL 12.5 MG/5ML PO ELIX: 12.5000 mg | ORAL_SOLUTION | Freq: Four times a day (QID) | ORAL | Status: DC | PRN

## 2017-08-17 MED ORDER — LORAZEPAM 2 MG/ML IJ SOLN
0.5000 mg | INTRAMUSCULAR | Status: DC | PRN
  Administered 2017-08-17 – 2017-08-18 (×4): 0.5 mg via INTRAVENOUS
  Filled 2017-08-17 (×4): qty 1

## 2017-08-17 MED ORDER — HYDROMORPHONE 1 MG/ML IV SOLN
INTRAVENOUS | Status: DC
  Administered 2017-08-17: 18:00:00 via INTRAVENOUS
  Administered 2017-08-17: 0.5 mg via INTRAVENOUS
  Administered 2017-08-18: 0 mg via INTRAVENOUS
  Administered 2017-08-18: 0.2 mg via INTRAVENOUS
  Administered 2017-08-18: 1 mg via INTRAVENOUS
  Administered 2017-08-18: 0.6 mg via INTRAVENOUS
  Filled 2017-08-17: qty 25

## 2017-08-17 MED ORDER — DIPHENHYDRAMINE HCL 50 MG/ML IJ SOLN: 12.5000 mg | Freq: Four times a day (QID) | INTRAMUSCULAR | Status: DC | PRN

## 2017-08-17 MED ORDER — POTASSIUM CHLORIDE 10 MEQ/100ML IV SOLN
10.0000 meq | INTRAVENOUS | Status: AC
  Administered 2017-08-17 (×4): 10 meq via INTRAVENOUS
  Filled 2017-08-17 (×3): qty 100

## 2017-08-17 MED ORDER — MAGNESIUM SULFATE 2 GM/50ML IV SOLN
2.0000 g | Freq: Once | INTRAVENOUS | Status: AC
  Administered 2017-08-17: 2 g via INTRAVENOUS
  Filled 2017-08-17: qty 50

## 2017-08-17 NOTE — Progress Notes (Signed)
Daily Progress Note   Patient Name: Carol Ferguson       Date: 08/17/2017 DOB: 1950-12-23  Age: 67 y.o. MRN#: [id number] Attending Physician: Kerney Elbe, DO Primary Care Physician: [name], PA-C Admit Date: 08/13/2017  Reason for Consultation/Follow-up: Establishing goals of care and Terminal Care  Subjective: Patient will wake to voice. Alert and oriented. C/o of 4/10 abdominal pain. Also c/o of nausea this afternoon.   Niece, Rachel Moulds, at bedside. Introduced myself with palliative medicine. Ms. Dipinto tells me she is overwhelmed with decisions and feels anxious.   We focused on her symptom management this afternoon. She becomes very nauseous after receiving morphine dose. She tells me GI MD mentioned starting PCA pump to better control pain. Patient agreeable with initiation of PCA pump and hopeful for pain relief. We also discussed scheduling medicine for nausea. Patient understands that a hospice facility may not continue PCA pump when discharged to facility.   I confirmed her goal of comfort focused care. I asked about venting g-tube (which she has previously declined). Patient tells me "I have not made that decision yet." We also discussed hospice facility on discharge. She states "will I ever get to go home?" Explained that she had previously expressed her wish to discharge to hospice facility but that hospice services can care for patients in their homes too. Ms. Rotert became overwhelmed during this part of the conversation. I reassured her we would focus on her pain management this evening and overnight and further discuss other decisions tomorrow. Patient and niece agreeable.   Answered questions and concerns. Emotional support provided.   Length of Stay:  4  Current Medications: Scheduled Meds:  . aspirin  81 mg Oral Daily  . HYDROmorphone   Intravenous Q4H  . mouth rinse  15 mL Mouth Rinse BID  . ondansetron (ZOFRAN) IV  4 mg Intravenous Q4H  . ticagrelor  90 mg Oral BID    Continuous Infusions: . dextrose 5 % and 0.9% NaCl 100 mL/hr at 08/17/17 1257    PRN Meds: acetaminophen **OR** acetaminophen, diphenhydrAMINE **OR** diphenhydrAMINE, LORazepam, nitroGLYCERIN, prochlorperazine, sodium chloride flush  Physical Exam  Constitutional: She is oriented to person, place, and time. She is cooperative. She appears ill.  HENT:  Head: Normocephalic and atraumatic.  Pulmonary/Chest: No accessory muscle usage. No tachypnea.  No respiratory distress.  Abdominal: She exhibits distension. There is tenderness.  Neurological: She is alert and oriented to person, place, and time.  Skin: Skin is warm and dry.  Nursing note and vitals reviewed.          Vital Signs: BP 130/65 (BP Location: Left Arm)   Pulse 98   Temp 99 F (37.2 C) (Oral)   Resp 16   Ht 5\' 5"  (1.651 m)   Wt 55.6 kg (122 lb 9.2 oz)   SpO2 98%   BMI 20.40 kg/m  SpO2: SpO2: 98 % O2 Device: O2 Device: Room Air O2 Flow Rate:    Intake/output summary:   Intake/Output Summary (Last 24 hours) at 08/17/2017 1714 Last data filed at 08/17/2017 1656 Gross per 24 hour  Intake 2636.67 ml  Output -  Net 2636.67 ml   LBM:   Baseline Weight: Weight: 55.6 kg (122 lb 9.2 oz) Most recent weight: Weight: 55.6 kg (122 lb 9.2 oz)       Palliative Assessment/Data: PPS 30%   Flowsheet Rows     Most Recent Value  Intake Tab  Referral Department  Hospitalist  Unit at Time of Referral  Oncology Unit  Date Notified  08/14/17  Palliative Care Type  New Palliative care  Reason for referral  Advance Care Planning, Pain  Date of Admission  08/13/17  Date first seen by Palliative Care  08/16/17  # of days Palliative referral response time  2 Day(s)  # of days IP prior to Palliative  referral  1  Clinical Assessment  Palliative Performance Scale Score  30%  Psychosocial & Spiritual Assessment  Palliative Care Outcomes  Patient/Family meeting held?  Yes  Who was at the meeting?  patient and nephew  Palliative Care Outcomes  Improved pain interventions, Clarified goals of care      Patient Active Problem List   Diagnosis Date Noted  . Protein-calorie malnutrition, severe 08/15/2017  . Other constipation 08/04/2017  . Peripheral neuropathy due to chemotherapy ([place]) 08/04/2017  . Mild protein-calorie malnutrition ([place]) 08/04/2017  . Anxiety 07/16/2017  . Chronic nausea 07/16/2017  . Weight loss 07/16/2017  . Colon cancer ([place]) 07/15/2017  . Palliative care encounter 07/15/2017  . SBO (small bowel obstruction) ([place]) 07/12/2017  . Left tubo-ovarian mass 07/02/2017  . Peritoneal carcinomatosis ([place]) 07/02/2017  . Diverticulosis 01/10/2017  . Cervical mass 12/27/2016  . CAD (coronary artery disease), native coronary artery 11/27/2016  . Chronic diastolic heart failure ([place]) 09/27/2016  . Hyperlipidemia with target LDL less than 70 09/27/2016  . 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 Lumbar degenerative disc disease 06/14/2016    Palliative Care Assessment & Plan   Patient Profile: 67 y.o. female  with past medical history of peritoneal carcinomatosis (dx 06/2017) being treated with FOLFOX (last dose 08/06/17), h/o bowel obstruction, peripheral neuropathy from chemotherapy, CAD with h/o STEMI s/p DES to LCx (09/2016), chronic nausea, mild protein-calorie malnutrion admitted on 08/13/2017 with SBO. Patient has been evaluated by surgery and oncology and felt not to be a surgical candidate. She is felt to have a poor prognosis in the event that SBO does not resolve with conservative management. Palliative care has been asked to consult to help address goals of care.   Assessment: SBO Peritoneal carcinomatosis Abdominal pain Nausea Anxiety  state Severe protein calorie malnutrition Hx of CAD and ischemic cardiomyopathy  Recommendations/Plan:  Continue conservative management of SBO.  DNR  Symptom management  Dilaudid PCA initiated: 0.2mg   q70min prn. 1mg /hr lockout. No basal rate. Loading dose 0.5mg .  Zofran 4mg  IV q4h scheduled for nausea/vomiting  Ativan 0.5-1mg  IV q4h prn anxiety  Compazine 5mg  IV q6h prn nausea/vomiting  Patient considering hospice facility on discharge.   PMT will follow.   Code Status: DNR   Code Status Orders  (From admission, onward)        Start     Ordered   08/16/17 0943  Do not attempt resuscitation (DNR)  Continuous    Question Answer Comment  In the event of cardiac or respiratory ARREST Do not call a "code blue"   In the event of cardiac or respiratory ARREST Do not perform Intubation, CPR, defibrillation or ACLS   In the event of cardiac or respiratory ARREST Use medication by any route, position, wound care, and other measures to relive pain and suffering. May use oxygen, suction and manual treatment of airway obstruction as needed for comfort.      08/16/17 0943    Code Status History    Date Active Date Inactive Code Status Order ID Comments User Context   08/13/2017 2253 08/16/2017 0943 Full Code [id number]  Rise Patience, MD ED   07/12/2017 1003 07/14/2017 2015 Full Code [id number]  Elwin Mocha, MD Inpatient   09/26/2016 1147 09/28/2016 1438 Full Code [id number]  Belva Crome, MD Inpatient   09/26/2016 1147 09/26/2016 1147 Full Code [id number]  Leanor Kail, PA Inpatient    Advance Directive Documentation     Most Recent Value  Type of Advance Directive  Healthcare Power of Attorney  Pre-existing out of facility DNR order (yellow form or pink MOST form)  -  "MOST" Form in Place?  -       Prognosis:   Poor prognosis with small bowel obstruction not a surgical candidate and peritoneal carcinomatosis   Discharge Planning:  To Be Determined likely  hospice facility  Care plan was discussed with RN, patient, niece, Dr. Domingo Cocking  Thank you for allowing the Palliative Medicine Team to assist in the care of this patient.   Time In: 1615 Time Out: 1725 Total Time 75min Prolonged Time Billed  yes      Greater than 50%  of this time was spent counseling and coordinating care related to the above assessment and plan.  Ihor Dow, FNP-C Palliative Medicine Team  Phone: [phone] Fax: [phone]  Please contact Palliative Medicine Team phone at [phone] for questions and concerns.

## 2017-08-17 NOTE — Progress Notes (Signed)
PROGRESS NOTE    Carol Ferguson  TML:[id number] DOB: 08-25-1950 DOA: 08/13/2017 PCP: [name], PA-C   Brief Narrative:  Carol Ferguson is a 67 y.o. female with history of peritoneal carcinomatosis being followed by Oncology [name] and is receiving chemotherapy, CAD status post stenting for ST elevation MI in June 2018 on aspirin and Brilinta presents to the ER because of persistent abdominal pain.  Patient has been having abdominal pain over the last 1 week.  This is a 1 week following the chemotherapy.  Had multiple bouts of vomiting 5 days ago following which patient has not had much food and has not had any further episodes of vomiting.  Last bowel movement was around 3 days ago.  Pain is mostly diffuse and sometimes colicky appearance denies any chest pain or shortness of breath but tender with abdominal pain radiates to her chest.  In the ER patient had CT abdomen and pelvis which show concern for small bowel obstruction and General Surgery was consulted for further evaluation and recommendations.  Because there may not be any good surgical options General Surgery recommended Oncology input for evaluation and recommendations. Oncology evaluated and feel that if she does not improve from this or her partial small bowel obstruction becomes complete, comfort cares would potentially be necessitated as well as considering a venting G-tube. Gastroenterology consulted for goal about endoscopic stenting and this is not possible based on her location she is a nonsurgical candidate palliative care would be the best option.  Patient discussed with gastroenterology about her overall situation and patient feels she may be ready to transition to comfort care  Palliative care has been involved and met with the patient today after Gastroenterology and patient debating about whether going to residential hospice or not;  she is leaning toward transitioning to comfort measures currently.  She is  placed on a Dilaudid PCA by Gastroenterology and medications not in patient's benefit have been stopped.  Patient continues to discuss further goals of care with palliative in the morning and to decide about possible residential hospice placement.  Assessment & Plan:   Principal Problem:   SBO (small bowel obstruction) (HCC) Active Problems:   CAD (coronary artery disease), native coronary artery   Peritoneal carcinomatosis (HCC)   Colon cancer (HCC)   Goals of care, counseling/discussion   Anxiety state   Nausea without vomiting   Protein-calorie malnutrition, severe   Cancer associated pain   Palliative care by specialist  SBO with a History of peritoneal Carcinomatosis -CT Abd/Pelvis showed The findings within the abdomen and pelvis are similar to that of 07/12/2017. There has been redevelopment of a small to moderate volume of ascites, small bowel dilatation compatible with small bowel obstruction with probable zone of transition in the right hemipelvis secondary to pelvic mass like abnormality that may reflect peritoneal carcinomatosis for localized pelvic necrotic adenopathy. Redemonstration of omental disease with induration and thickening.  -General Surgery Consulted and appreciate further recommendations  -Patient is kept NPO except medications and sips and will continue for now -Continue with D5 Normal Saline of 100 mL's -Pain control with IV morphine 2 mg every 3 as needed for severe pain -Continue with Antiemetics with Zofran 4 mg p.o./IV as well as 5 mg of IV Compazine every 6 as needed for nausea and vomiting that is refractory to Zofran -KUB this AM showed stable SBO and unchanged -Patient had some nausea this AM and abdominal pain was worse today  -Plan is to manage  conservatively at this time.   -General Surgery is concerned that these issues were recurrent and they do not think there is an operative solution to this however they may consider a venting G-tube for  palliation but patient has refused  -Oncology evaluated and had recommended supportive measures for the next few days including n.p.o. IV hydration.   -Oncology feels if her symptoms do not improve or her bowel obstruction becomes complete then potentially proceeding with comfort measures would be the best option and considering a venting G-tube.  However she does improve and can take oral nutrition chemotherapy may further palliate her disease  -Gastroenterology was consulted for possible endoscopic stenting however this is not possible based on her location and they feel that palliative care is appropriate in the situation. -Given Gastroenterology's discussion patient does not want a venting G-tube and likely wants to transition to comfort care. Dilaudid PCA was started by gastroenterology and will be continued -Comfort measures have been started -There have been discussions with the patient and patient undecided about potential hospice placement and will decide further tomorrow  History of CAD and Ischemic Cardiomyopathy status post stenting in June 2018  -Continue with Aspirin 81 mg p.o. daily, Atorvastatin 10 mg p.o. nightly, Brilinta 90 mg p.o. twice daily for now until patient is full comfort  Peritoneal Carcinomatosis  -Likely GI in Origin from -Being followed by Oncology [name] currently undergoing chemotherapy with her next dose on 4/29 -Oncology consulted and Appreciate Dr. Hazeline Junker evaluation.  He discussed the prognosis with the patient in detail -Primary Oncologist [name] to follow-up with patient when she is back in town -Discussion as above patient placed on a Dilaudid PCA for pain and likely will be positioning to comfort care.  Undecided about residential hospice placement are not positive to follow-up in the morning and have a discussion  Depression and Anxiety -Patient takes Fluoxetine 10 mg p.o. every other day and will restart in the a.m. -Resume Lorazepam 0.5 mg  every 8 as needed for Anxiety  Hyperglycemia -Likely Reactive -Checked Hemoglobin A1c and was 5.6 -Continue to treat blood sugars and if consistently elevated will place on a Sensitive sliding scale insulin  Hypomagnesemia  -Patient mag level was 1.6 this AM -Replete with 2 g of IV mag sulfate -Continue to monitor and replete as necessary -Repeat mag level in a.m.  Hypokalemia -Patient's potassium level was 3.5 -Replete with IV KCl 40 mEq as patient is n.p.o. Except meds and sips -Continue to monitor and replete as necessary  -Repeat CMP in AM  Severe protein calorie malnutrition in the context of chronic illness. -Patient remains n.p.o. at this time and goals of care discussions will be had if patient does not improve significantly.  May go on comfort measures however she does not wish palliation would initiate TPN for nutrition supplementation  DVT prophylaxis: SCDs. Code Status: FULL CODE Family Communication: No family present at bedside Disposition Plan: Remain inpatient and determine disposition after palliative care goals of discussion she may go home with hospice versus residential hospice  Consultants:  General Surgery  Medical Oncology Palliative Care Medicine    Procedures: None   Antimicrobials:  Anti-infectives (From admission, onward)   None     Subjective: Seen and examined at bedside and asked about possible GI evaluation for endoscopic stenting.  Gas or having any complaint of having some nausea this a.m. and still has not passing any bowel movements.  Denied any chest pain, shortness breath, but did have some  nausea but no vomiting this morning.   Objective: Vitals:   08/17/17 0534 08/17/17 1509 08/17/17 1820 08/17/17 1828  BP: 118/72 130/65 113/65 107/65  Pulse: 91 98 96 94  Resp: 18 16  16   Temp: 98.6 F (37 C) 99 F (37.2 C) 99.1 F (37.3 C)   TempSrc: Oral Oral Oral   SpO2: 96% 98% 93% 93%  Weight:      Height:        Intake/Output  Summary (Last 24 hours) at 08/17/2017 1850 Last data filed at 08/17/2017 1656 Gross per 24 hour  Intake 2636.67 ml  Output -  Net 2636.67 ml   Filed Weights   08/13/17 2311  Weight: 55.6 kg (122 lb 9.2 oz)   Examination: Physical Exam:  Constitutional: Thin Caucasian female who is currently no acute distress remains frustrated about her lack of improvement but appears more anxious today.. Eyes: Sclera anicteric.  Lids and conjunctive are normal. ENMT: External ears and nose appear normal.  Grossly normal hearing Neck: Supple with no JVD Respiratory: Diminished to auscultation bilaterally.  No appreciable wheezing, rales, rhonchi.  Patient is not tachypneic or using accessory muscles to breathe Cardiovascular: Regular rate and rhythm.  No appreciable murmurs, rubs, gallops.  No lower extremity edema Abdomen: Soft, more tender to palpate today.  Slightly distended.  Hypertympanic on percussion again.  Bowel sounds are diminished x4 GU: Deferred Musculoskeletal: No contractures or cyanosis.  No joint deformities noted Skin: Skin is warm and dry with no appreciable rashes, lesions on limited skin evaluation Neurologic: Cranial nerves II through XII grossly intact with no appreciable focal deficits Psychiatric: Intact judgment insight.  Patient is awake, alert, and oriented x3.  Patient remains frustrated that she is not improving and appears anxious  Data Reviewed: I have personally reviewed following labs and imaging studies  CBC: Recent Labs  Lab 08/13/17 1343 08/14/17 0433 08/15/17 0410 08/16/17 0344 08/17/17 0323  WBC 8.4 6.1 6.8 8.5 6.1  NEUTROABS 6.4  --  4.8 6.2 4.1  HGB 14.0 12.8 12.1 12.1 12.0  HCT 40.9 37.2 35.4* 35.1* 34.5*  MCV 95.9 95.1 94.7 94.4 93.8  PLT 236 200 181 147* 735   Basic Metabolic Panel: Recent Labs  Lab 08/13/17 1343 08/14/17 0433 08/15/17 0410 08/16/17 0344 08/17/17 0323  NA 137 137 137 137 136  K 3.6 3.5 3.7 3.2* 3.5  CL 102 104 103 103  103  CO2 32* 27 25 23 25   GLUCOSE 99 115* 95 101* 111*  BUN 28* 25* 15 11 8   CREATININE 0.80 0.79 0.63 0.61 0.67  CALCIUM 9.1 8.4* 8.3* 8.0* 8.2*  MG  --  1.7 1.6* 1.8 1.6*  PHOS  --  3.5 3.1 3.1 3.0   GFR: Estimated Creatinine Clearance: 58.3 mL/min (by C-G formula based on SCr of 0.67 mg/dL). Liver Function Tests: Recent Labs  Lab 08/13/17 1343 08/15/17 0410 08/16/17 0344 08/17/17 0323  AST 14 30 21 21   ALT 13 28 22 20   ALKPHOS 86 94 90 81  BILITOT 0.7 0.7 0.9 0.7  PROT 6.6 5.4* 5.2* 5.5*  ALBUMIN 3.2* 2.6* 2.5* 2.5*   Recent Labs  Lab 08/13/17 1728  LIPASE 39   No results for input(s): AMMONIA in the last 168 hours. Coagulation Profile: No results for input(s): INR, PROTIME in the last 168 hours. Cardiac Enzymes: No results for input(s): CKTOTAL, CKMB, CKMBINDEX, TROPONINI in the last 168 hours. BNP (last 3 results) No results for input(s): PROBNP in the last 8760  hours. HbA1C: Recent Labs    08/16/17 0344  HGBA1C 5.6   CBG: Recent Labs  Lab 08/16/17 0753 08/16/17 1614 08/17/17 0000 08/17/17 0758 08/17/17 1558  GLUCAP 95 83 107* 117* 123*   Lipid Profile: No results for input(s): CHOL, HDL, LDLCALC, TRIG, CHOLHDL, LDLDIRECT in the last 72 hours. Thyroid Function Tests: No results for input(s): TSH, T4TOTAL, FREET4, T3FREE, THYROIDAB in the last 72 hours. Anemia Panel: No results for input(s): VITAMINB12, FOLATE, FERRITIN, TIBC, IRON, RETICCTPCT in the last 72 hours. Sepsis Labs: Recent Labs  Lab 08/13/17 1739  LATICACIDVEN 0.71    Recent Results (from the past 240 hour(s))  Urine culture     Status: Abnormal   Collection Time: 08/13/17  7:06 PM  Result Value Ref Range Status   Specimen Description   Final    URINE, CLEAN CATCH Performed at [facility] [address], [place], [place] [address]    Special Requests   Final    NONE Performed at [facility] [place] [address], [place], [place] [address]     Culture 20,000 COLONIES/mL ENTEROCOCCUS FAECALIS (A)  Final   Report Status 08/16/2017 FINAL  Final   Organism ID, Bacteria ENTEROCOCCUS FAECALIS (A)  Final      Susceptibility   Enterococcus faecalis - MIC*    AMPICILLIN <=2 SENSITIVE Sensitive     LEVOFLOXACIN 2 SENSITIVE Sensitive     NITROFURANTOIN <=16 SENSITIVE Sensitive     VANCOMYCIN 2 SENSITIVE Sensitive     * 20,000 COLONIES/mL ENTEROCOCCUS FAECALIS    Radiology Studies: Dg Abd 1 View  Result Date: 08/17/2017 CLINICAL DATA:  Abdominal distension. EXAM: ABDOMEN - 1 VIEW COMPARISON:  August 16, 2017 FINDINGS: Stable small bowel obstruction.  No other interval changes. IMPRESSION: Stable small bowel obstruction. Electronically Signed   By: Dorise Bullion III M.D   On: 08/17/2017 07:48   Dg Abd Portable 1v  Result Date: 08/16/2017 CLINICAL DATA:  Small bowel obstruction. EXAM: PORTABLE ABDOMEN - 1 VIEW COMPARISON:  Abdominal x-ray from yesterday. FINDINGS: Unchanged mildly dilated small bowel loops in the central abdomen. Contrast is again noted within the cecum. No acute osseous abnormality. IMPRESSION: 1. Unchanged small bowel obstruction. Electronically Signed   By: Titus Dubin M.D.   On: 08/16/2017 09:04   Scheduled Meds: . aspirin  81 mg Oral Daily  . HYDROmorphone   Intravenous Q4H  . mouth rinse  15 mL Mouth Rinse BID  . ondansetron (ZOFRAN) IV  4 mg Intravenous Q4H  . ticagrelor  90 mg Oral BID   Continuous Infusions: . dextrose 5 % and 0.9% NaCl 100 mL/hr at 08/17/17 1257    LOS: 4 days   Kerney Elbe, DO [facility] Pager [phone]  If 7PM-7AM, please contact night-coverage [email or web address] Password [facility] 08/17/2017, 6:50 PM

## 2017-08-17 NOTE — Progress Notes (Signed)
Pt had eventful day. Pt very emotional with the  Dx and prognosis given by GI. Pt wants to go to hospice as she understand her disease process and that there is nothing else that can be done for her. Sat with pt and allowed her to vent and cry. She is awaiting arrival of her niece to discuss the news from the MD.  Discussed pain management with GI MD. He is receptive to PCA.   Called and spoke with [facility] med MD regarding PCA with a basel rate. He informed this RN that someone would be up to see patient. It will be addressed at that time. If pain management became an issue I can call back for further direction.  Pt aware. Report this to oncoming RN as well

## 2017-08-17 NOTE — Consult Note (Signed)
Consultation  Referring Provider: Dr. Alfredia Ferguson     Primary Care Physician:  [name], PA-C Primary Gastroenterologist:   Dr. Silverio Decamp      Reason for Consultation:   SBO, Peritoneal Carcinomatosis           HPI:   Carol Ferguson is a 67 y.o. female with a past medical history as listed below including peritoneal carcinomatosis currently undergoing chemotherapy, last August 06, 2017, who presented to the hospital 08/13/2017 with symptoms of abdominal pain and vomiting.  CT scan showing small to moderate volume ascites and small bowel dilation compatible with small bowel obstruction.  Started on IV hydration and general surgery was consulted.  No surgical intervention was felt helpful.  Patient has not improved over the past 4 days.  We were consulted today in regards to possible bowel stenting.    Today, patient found weary and tired in bed.  She struggles through her history with me and is verbally quite frustrated at the fact that she has had nothing to eat or drink in the past 4 days since admission.  Describes generalized abdominal pain.  Bowel sounds have decreased since admission per her.  She has not passed gas or any stool in the past 4 days.  Patient is aware of her situation and has spoken with palliative care already.  Previous GI history: 06/18/2017 office visit Dr. Silverio Decamp: For abdominal pain, repeat CT abdomen pelvis ordered- suggestive of metastatic ovarian cancer, with new 7.4 cm left adnexal mass 04/18/2017 office visit Dr. Silverio Decamp: for heartburn, epigastric discomfort and right lower quadrant abdominal pain, prescribed Omeprazole 20 mg daily, recommended bowel purge with MiraLAX and Gatorade, Bentyl 10 mg every 8 hours as needed for abdominal cramping and scheduled for a colonoscopy, patient was on Brilinta at that time and this is discussed with her cardiologist, they recommended waiting 12 months from NSTEMI to have her colonoscopy  Past Medical History:  Diagnosis  Date  . Anxiety   . 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 CAD (coronary artery disease)    a. Inf STEMI/LHC 09/2016 which showed occluded mid LCx treated with PCI, DES; remaining cath details included a proximal to mid LAD lesion of 20% stenosis, and proximal to mid RCA 25% stenosis. EF 55-60%.  . Depression   . Diverticulosis   . Gallstones   . Ischemic cardiomyopathy    a. 2D Echo 09/28/16 showed EF 55-60%, probable severe hypokinesis of the entire inferiormyocardium, normal diastolic parameters.  . Myocardial infarction ([place]) 09/26/2016  . Pneumonia   . Presence of tooth-root and mandibular implants   . Tobacco abuse   . Wears glasses     Past Surgical History:  Procedure Laterality Date  . CERVICAL CONIZATION W/BX N/A 01/28/2017   Procedure: CONIZATION CERVIX WITH BIOPSY;  Surgeon: [name], MD;  Location: [facility];  Service: Gynecology;  Laterality: N/A;  . CHOLECYSTECTOMY    . CORONARY STENT INTERVENTION N/A 09/26/2016   Procedure: Coronary Stent Intervention;  Surgeon: Belva Crome, MD;  Location: [facility];  Service: Cardiovascular;  Laterality: N/A;  . DILATION AND CURETTAGE OF UTERUS N/A 01/28/2017   Procedure: DILATATION AND CURETTAGE WITH ULTRASOUND GUIDENCE;  Surgeon: [name], MD;  Location: [facility];  Service: Gynecology;  Laterality: N/A;  . LEFT HEART CATH AND CORONARY ANGIOGRAPHY N/A 09/26/2016   Procedure: Left Heart Cath and Coronary Angiography;  Surgeon: Belva Crome, MD;  Location: [facility];  Service: Cardiovascular;  Laterality: N/A;    Family History  Problem Relation Age of Onset  . Cancer Brother        MELANOMA  . Heart disease Father   . Stroke Father   . Heart disease Brother   . Diabetes Brother   . Heart attack Brother   . Heart failure Brother      Social History   Tobacco Use  . Smoking status: Former Smoker    Packs/day: 1.00    Years: 35.00    Pack years: 35.00    Types:  Cigarettes    Last attempt to quit: 09/26/2016    Years since quitting: 0.8  . Smokeless tobacco: Never Used  Substance Use Topics  . Alcohol use: Yes    Comment: occasional  . Drug use: No    Prior to Admission medications   Medication Sig Start Date End Date Taking? Authorizing Provider  aspirin 81 MG chewable tablet Chew 1 tablet (81 mg total) by mouth daily. 09/28/16  Yes Dunn, Areta Haber, PA-C  atorvastatin (LIPITOR) 10 MG tablet Take 1 tablet (10 mg total) by mouth daily. 12/02/16 07/12/24 Yes Belva Crome, MD  BRILINTA 90 MG TABS tablet TAKE 1 TABLET BY MOUTH TWICE A DAY 10/25/16  Yes Dunn, Dayna N, PA-C  dicyclomine (BENTYL) 10 MG capsule TAKE 1 CAPSULE BY MOUTH EVERY 8 HOURS AS NEEDED FOR SPASMS. FOR ABDOMINAL DISCOMFORT 06/20/17  Yes Nandigam, Venia Minks, MD  FLUoxetine (PROZAC) 10 MG capsule TAKE 1 CAPSULE BY MOUTH EVERY DAY Patient taking differently: TAKE 1 CAPSULE BY MOUTH EVERY other DAY 07/22/17  Yes Timmothy Euler, [place] D, PA-C  LORazepam (ATIVAN) 0.5 MG tablet Take 1 tablet (0.5 mg total) by mouth every 8 (eight) hours as needed for anxiety. Patient taking differently: Take 0.5 mg by mouth every 8 (eight) hours as needed for anxiety (nausea).  08/04/17  Yes Heath Lark, MD  magic mouthwash w/lidocaine SOLN Take 5 mLs by mouth 4 (four) times daily. Swish and spit. Patient taking differently: Take 5 mLs by mouth 4 (four) times daily as needed for mouth pain. Swish and spit. 07/24/17  Yes Gorsuch, Ni, MD  ondansetron (ZOFRAN) 8 MG tablet Take 1 tablet (8 mg total) by mouth every 8 (eight) hours as needed for refractory nausea / vomiting. Start on day 3 after chemotherapy. 07/16/17  Yes Heath Lark, MD  prochlorperazine (COMPAZINE) 10 MG tablet Take 1 tablet (10 mg total) by mouth every 6 (six) hours as needed (Nausea or vomiting). 07/16/17  Yes Heath Lark, MD  traZODone (DESYREL) 50 MG tablet Take 0.5-1 tablets (25-50 mg total) at bedtime as needed by mouth for sleep. Patient taking differently:  Take 25 mg by mouth at bedtime.  03/06/17  Yes Timmothy Euler, [place] D, PA-C  glycerin adult 2 g suppository Place 1 suppository rectally as needed for constipation. Patient not taking: Reported on 08/13/2017 07/27/17   Tanna Furry, MD  glycerin adult 2 g suppository Place 1 suppository rectally as needed for constipation. Patient not taking: Reported on 08/13/2017 07/27/17   Tanna Furry, MD  HYDROcodone-acetaminophen [facility]) 5-325 MG tablet Take 1 tablet by mouth every 4 (four) hours as needed for moderate pain. 08/13/17   Tanner, Lyndon Code., PA-C  nitroGLYCERIN (NITROSTAT) 0.4 MG SL tablet Place 1 tablet (0.4 mg total) under the tongue every 5 (five) minutes x 3 doses as needed for chest pain. 09/28/16   Rise Mu, PA-C    Current  Facility-Administered Medications  Medication Dose Route Frequency Provider Last Rate Last Dose  . acetaminophen (TYLENOL) tablet 650 mg  650 mg Oral Q6H PRN Rise Patience, MD       Or  . acetaminophen (TYLENOL) suppository 650 mg  650 mg Rectal Q6H PRN Rise Patience, MD      . aspirin chewable tablet 81 mg  81 mg Oral Daily Rise Patience, MD   81 mg at 08/17/17 0943  . atorvastatin (LIPITOR) tablet 10 mg  10 mg Oral Daily Rise Patience, MD   10 mg at 08/16/17 2224  . dextrose 5 %-0.9 % sodium chloride infusion   Intravenous Continuous Raiford Noble [place], DO 100 mL/hr at 08/16/17 2309    . LORazepam (ATIVAN) tablet 0.5 mg  0.5 mg Oral Q8H PRN Raiford Noble [place], DO   0.5 mg at 08/17/17 6144  . magnesium sulfate IVPB 2 g 50 mL  2 g Intravenous Once Sheikh, Omair Latif, DO      . MEDLINE mouth rinse  15 mL Mouth Rinse BID Rise Patience, MD   15 mL at 08/17/17 0942  . morphine 4 MG/ML injection 2 mg  2 mg Intravenous Q3H PRN Rise Patience, MD   2 mg at 08/17/17 0943  . nitroGLYCERIN (NITROSTAT) SL tablet 0.4 mg  0.4 mg Sublingual Q5 Min x 3 PRN Rise Patience, MD      . ondansetron [facility]) tablet 4 mg  4 mg Oral Q6H PRN Rise Patience, MD       Or  . ondansetron [facility]) injection 4 mg  4 mg Intravenous Q6H PRN Rise Patience, MD   4 mg at 08/16/17 2243  . potassium chloride 10 mEq in 100 mL IVPB  10 mEq Intravenous Q1 Hr x 4 Sheikh, Omair Latif, DO      . prochlorperazine (COMPAZINE) injection 5 mg  5 mg Intravenous Q6H PRN Rise Patience, MD   5 mg at 08/17/17 1004  . sodium chloride flush (NS) 0.9 % injection 10-40 mL  10-40 mL Intracatheter PRN Rise Patience, MD   20 mL at 08/16/17 0342  . ticagrelor (BRILINTA) tablet 90 mg  90 mg Oral BID Rise Patience, MD   90 mg at 08/17/17 1004   Facility-Administered Medications Ordered in Other Encounters  Medication Dose Route Frequency Provider Last Rate Last Dose  . heparin lock flush 100 unit/mL  500 Units Intracatheter Once PRN Alvy Bimler, Ni, MD      . sodium chloride flush (NS) 0.9 % injection 10 mL  10 mL Intracatheter PRN Heath Lark, MD        Allergies as of 08/13/2017 - Review Complete 08/13/2017  Allergen Reaction Noted  . Flagyl [metronidazole] Nausea Only 12/13/2016     Review of Systems:    Constitutional: +weight loss Skin: No rash Cardiovascular: No chest pain Respiratory: No SOB  Gastrointestinal: See HPI and otherwise negative Genitourinary: No dysuria  Neurological: No headache Musculoskeletal: No new muscle or joint pain Hematologic: No bleeding  Psychiatric: +anxiety   Physical Exam:  Vital signs in last 24 hours: Temp:  [97.6 F (36.4 C)-99 F (37.2 C)] 98.6 F (37 C) (04/28 0534) Pulse Rate:  [89-92] 91 (04/28 0534) Resp:  [17-18] 18 (04/28 0534) BP: (114-119)/(49-72) 118/72 (04/28 0534) SpO2:  [96 %-98 %] 96 % (04/28 0534)   General:  Frail appearing Caucasian female appears to be in mild distress, lethargic, cooperative Head:  Normocephalic and  atraumatic. Eyes:   PEERL, EOMI. No icterus. Conjunctiva pink. Ears:  Normal auditory acuity. Neck:  Supple Throat: Oral cavity and pharynx without  inflammation, swelling or lesion.  Lungs: Respirations even and unlabored. Lungs clear to auscultation bilaterally.   No wheezes, crackles, or rhonchi.  Heart: Normal S1, S2. No MRG. Regular rate and rhythm. No peripheral edema, cyanosis or pallor.  Abdomen: Tense, distended, Moderate generalized ttp. No rebound or guarding. No appreciable bowel sounds x4, No appreciable masses or hepatomegaly. Rectal:  Not performed.  Msk:  Symmetrical without gross deformities.  Extremities:  Without edema, no deformity or joint abnormality.  Neurologic:  Alert and  oriented x4;  grossly normal neurologically.  Skin:   Dry and intact without significant lesions or rashes. Psychiatric: Demonstrates good judgement, frustrated  LAB RESULTS: Recent Labs    08/15/17 0410 08/16/17 0344 08/17/17 0323  WBC 6.8 8.5 6.1  HGB 12.1 12.1 12.0  HCT 35.4* 35.1* 34.5*  PLT 181 147* 154   BMET Recent Labs    08/15/17 0410 08/16/17 0344 08/17/17 0323  NA 137 137 136  K 3.7 3.2* 3.5  CL 103 103 103  CO2 25 23 25   GLUCOSE 95 101* 111*  BUN 15 11 8   CREATININE 0.63 0.61 0.67  CALCIUM 8.3* 8.0* 8.2*   LFT Recent Labs    08/17/17 0323  PROT 5.5*  ALBUMIN 2.5*  AST 21  ALT 20  ALKPHOS 81  BILITOT 0.7    STUDIES: Dg Abd 1 View  Result Date: 08/17/2017 CLINICAL DATA:  Abdominal distension. EXAM: ABDOMEN - 1 VIEW COMPARISON:  August 16, 2017 FINDINGS: Stable small bowel obstruction.  No other interval changes. IMPRESSION: Stable small bowel obstruction. Electronically Signed   By: Dorise Bullion III M.D   On: 08/17/2017 07:48   Dg Abd Portable 1v  Result Date: 08/16/2017 CLINICAL DATA:  Small bowel obstruction. EXAM: PORTABLE ABDOMEN - 1 VIEW COMPARISON:  Abdominal x-ray from yesterday. FINDINGS: Unchanged mildly dilated small bowel loops in the central abdomen. Contrast is again noted within the cecum. No acute osseous abnormality. IMPRESSION: 1. Unchanged small bowel obstruction. Electronically  Signed   By: Titus Dubin M.D.   On: 08/16/2017 09:04   EXAM: CT ABDOMEN AND PELVIS WITH CONTRAST 08/13/17  TECHNIQUE: Multidetector CT imaging of the abdomen and pelvis was performed using the standard protocol following bolus administration of intravenous contrast.  CONTRAST:  81mL ISOVUE-300 IOPAMIDOL (ISOVUE-300) INJECTION 61%, 140mL ISOVUE-300 IOPAMIDOL (ISOVUE-300) INJECTION 61%  COMPARISON:  CT from 07/18/2017 and 07/27/2017  FINDINGS: Lower chest: Normal size heart without pericardial effusion. Pulmonary mass or pulmonary consolidation. No effusion.  Hepatobiliary: Status post cholecystectomy. Mild intrahepatic ductal dilatation appears chronic and likely related to reservoir effect cholecystectomy. No choledocholithiasis. No focal liver lesion.  Pancreas: No pancreatic mass or pathologic ductal dilatation.  Spleen: Normal size spleen without mass.  Adrenals/Urinary Tract: Stable mild nodular appearance of the left adrenal apex. Symmetric enhancement of both kidneys with water attenuating 13 mm cyst in the upper pole of the left kidney. No nephrolithiasis nor hydroureteronephrosis. Nondistended urinary bladder without focal mural thickening, mass or calculus.  Stomach/Bowel: The stomach is distended with enteric contrast. No intraluminal mass or focal mural thickening. There are fluid-filled dilated small bowel loops out of proportion to nondistended large bowel, caliber up to 2.7 cm and consistent with changes of high-grade small bowel obstruction. Suspect that the transition zone is within the pelvis given irregularly enhancing masslike opacities within the lower pelvis concerning for  changes of possible peritoneal carcinomatosis and/or necrotic adenopathy. The colon is decompressed in appearance. Interval redevelopment of a small to moderate volume of ascites within the upper abdomen with redemonstration of omental edema and  thickening.  Vascular/Lymphatic: Nonaneurysmal atherosclerotic aorta and branch vessels.  Reproductive: Mixed solid and cystic masslike abnormality in the left adnexa possibly of ovarian etiology similar in appearance to prior. Similar in appearance uterus and right adnexa.  Other: Masslike abnormality in the right hemipelvis is redemonstrated, margins are ill-defined and difficult to delineate. This may be contributing to the patient's small-bowel obstruction either from adhesion or direct localized involvement of small bowel.  Musculoskeletal: No aggressive osseous lesions. Degenerative changes along the dorsal spine.  IMPRESSION: 1. The findings within the abdomen and pelvis are similar to that of 07/12/2017. There has been redevelopment of a small to moderate volume of ascites, small bowel dilatation compatible with small bowel obstruction with probable zone of transition in the right hemipelvis secondary to pelvic mass like abnormality that may reflect peritoneal carcinomatosis for localized pelvic necrotic adenopathy. 2. Redemonstration of omental disease with induration and thickening.   Electronically Signed   By: Ashley Royalty M.D.   On: 08/13/2017 20:19   Impression / Plan:   Impression: 1. SBO with history of peritoneal carcinomatosis: No improvement over the past 4 days, patient n.p.o. x4 days, no gas, limited BS  Plan: 1. We have been consulted in regards to possible bowel stenting. Dr. Hilarie Fredrickson will see this patient this afternoon, please await his recommendations, though it does not appear obstruction would be amenable to stenting.  Thank you for your kind consultation, we will sign off.  Lavone Nian Conner Muegge  08/17/2017, 11:30 AM Pager #: [phone]

## 2017-08-18 ENCOUNTER — Ambulatory Visit: Payer: Medicare Other | Admitting: Hematology and Oncology

## 2017-08-18 ENCOUNTER — Other Ambulatory Visit: Payer: Medicare Other

## 2017-08-18 ENCOUNTER — Ambulatory Visit: Payer: Medicare Other

## 2017-08-18 DIAGNOSIS — R112 Nausea with vomiting, unspecified: Secondary | ICD-10-CM

## 2017-08-18 DIAGNOSIS — C189 Malignant neoplasm of colon, unspecified: Secondary | ICD-10-CM

## 2017-08-18 LAB — GLUCOSE, CAPILLARY
GLUCOSE-CAPILLARY: 120 mg/dL — AB (ref 65–99)
Glucose-Capillary: 121 mg/dL — ABNORMAL HIGH (ref 65–99)

## 2017-08-18 MED ORDER — HALOPERIDOL LACTATE 5 MG/ML IJ SOLN
2.0000 mg | INTRAMUSCULAR | Status: DC | PRN
  Administered 2017-08-18: 2 mg via INTRAVENOUS
  Filled 2017-08-18: qty 1

## 2017-08-18 MED ORDER — LORAZEPAM 2 MG/ML IJ SOLN
0.5000 mg | INTRAMUSCULAR | Status: DC
  Administered 2017-08-18 – 2017-08-21 (×19): 0.5 mg via INTRAVENOUS
  Filled 2017-08-18 (×19): qty 1

## 2017-08-18 MED ORDER — HALOPERIDOL LACTATE 5 MG/ML IJ SOLN
2.0000 mg | Freq: Four times a day (QID) | INTRAMUSCULAR | Status: DC
  Administered 2017-08-18 – 2017-08-21 (×13): 2 mg via INTRAVENOUS
  Filled 2017-08-18 (×12): qty 1

## 2017-08-18 MED ORDER — HALOPERIDOL LACTATE 5 MG/ML IJ SOLN
2.0000 mg | INTRAMUSCULAR | Status: DC | PRN
  Administered 2017-08-18: 2 mg via INTRAVENOUS
  Filled 2017-08-18 (×2): qty 1

## 2017-08-18 MED ORDER — HYDROMORPHONE 1 MG/ML IV SOLN: INTRAVENOUS | Status: DC

## 2017-08-18 MED ORDER — ONDANSETRON HCL 4 MG/2ML IJ SOLN
4.0000 mg | Freq: Four times a day (QID) | INTRAMUSCULAR | Status: DC | PRN
Start: 2017-08-18 — End: 2017-08-21
  Administered 2017-08-19 (×2): 4 mg via INTRAVENOUS
  Filled 2017-08-18 (×2): qty 2

## 2017-08-18 MED ORDER — ONDANSETRON HCL 4 MG/2ML IJ SOLN
4.0000 mg | Freq: Once | INTRAMUSCULAR | Status: AC
  Administered 2017-08-18: 4 mg via INTRAVENOUS
  Filled 2017-08-18: qty 2

## 2017-08-18 MED ORDER — HYDROMORPHONE 1 MG/ML IV SOLN
INTRAVENOUS | Status: DC
  Administered 2017-08-18: 1.2 mg via INTRAVENOUS
  Administered 2017-08-18: 0.4 mg via INTRAVENOUS
  Administered 2017-08-19 (×2): 0 mg via INTRAVENOUS
  Administered 2017-08-19: 0.4 mg via INTRAVENOUS
  Administered 2017-08-19: 0.2 mg via INTRAVENOUS

## 2017-08-18 NOTE — Plan of Care (Signed)
  Problem: Pain Managment: Goal: General experience of comfort will improve Outcome: Progressing   Problem: Safety: Goal: Ability to remain free from injury will improve Outcome: Progressing   

## 2017-08-18 NOTE — Care Management Note (Signed)
Case Management Note  Patient Details  Name: Carol Ferguson MRN: [id number] Date of Birth: [birth year]  Subjective/Objective: 67 y/o f admitted w/SBO. Hx: Colon Ca.From home. Peritoneal carcinomatosis.Palliative following-DNR, IV dilaudid PCA-GOC ongoing discussions for possible residential hospice-CSW notified.                   Action/Plan:d/c residential hospice facility.   Expected Discharge Date:  (unknown)               Expected Discharge Plan:  [place]  In-House Referral:  Clinical Social Work  Discharge planning Services  CM Consult  Post Acute Care Choice:    Choice offered to:     DME Arranged:    DME Agency:     HH Arranged:    [place] Agency:     Status of Service:  In process, will continue to follow  If discussed at Long Length of Stay Meetings, dates discussed:    Additional Comments:  Dessa Phi, RN 08/18/2017, 12:48 PM

## 2017-08-18 NOTE — Progress Notes (Signed)
Chaplain providing spiritual care around end of life.    Met with Carol Ferguson, Carol Ferguson, in hallway.  Provided brief support with him.  Pt welcoming of chaplain presence.  Lethargic and intermittently oriented.  She requested prayers.  Provided prayers at bedside.    Pt did not recall paperwork which she needed to complete.  Will consult with SW / Dr Alvy Bimler.  Carol Ferguson will converse with pt and pt's niece, Carol Ferguson, to determine needs for notary.     Will continue to follow for spiritual support around end of life / caregiving.      [facility] / [facility] Chaplain Jerene Pitch MDiv, [facility]

## 2017-08-18 NOTE — Care Management Important Message (Signed)
Important Message  Patient Details  Name: ELARA COCKE MRN: [id number] Date of Birth: 12-29-1950   Medicare Important Message Given:  Yes    Kerin Salen 08/18/2017, 11:17 AMImportant Message  Patient Details  Name: KRYSTAL TEACHEY MRN: [id number] Date of Birth: February 02, 1951   Medicare Important Message Given:  Yes    Kerin Salen 08/18/2017, 11:17 AM

## 2017-08-18 NOTE — Progress Notes (Signed)
Carol Ferguson   DOB:04/23/1950   ZO#:[id number]    Assessment & Plan:  Recurrent small bowel obstruction secondary to peritoneal carcinomatosis from colon cancer She has accepted inability to tolerate further systemic chemotherapy. I will cancel all her outpatient treatment and focus on palliative measures only  Recurrent small bowel obstruction Severe nausea We discussed options including NG tube placement for venting gastrostomy tube.  The patient has declined these measures. In the meantime, she will continue IV fluids and antiemetics  Cancer associated pain She appears comfortable with Dilaudid PCA. Will continue the same  Goals of care discussion She has explicitly indicated the need to complete all legal paperwork before her death I have spoken with her medical healthcare power of attorney, Rachel Moulds, and we will try our best to expedite whatever paperwork needed to complete the patient's wishes.  I have consulted social worker for help in this regard We have discussed extensively about goals of care and she would like to transition her care to hospice eventually For now, we agreed to continue IV fluid support along with other active intervention Once the patient's legal paperwork is complete, she is in agreement to transition to full comfort measures I anticipate a possibility of inpatient hospital death versus residential hospice facility We discussed prognosis extensively If we stop IV fluids and other aggressive measures, I anticipate her prognosis to be less than 7 days She appreciates spiritual support.  I will consult chaplain for this Will follow I spent 40 minutes with the patient today  Heath Lark, MD 08/18/2017  8:18 AM   Subjective:  Patient is well-known to me.  Noted she was hospitalized due to recurrent small bowel obstruction.  CT scan showed persistent bowel obstruction with possible recurrent ascites.  The patient is experiencing significant amount of pain,  requiring continuous infusion of pain medicine.  She has repeatedly rejected the idea of NG tube placement.  She has continuous nausea, inability for any form of oral intake and no bowel movement for many days.  The patient repeatedly apologized to me for being a "difficult" patient.  She falls asleep intermittently during our conversation, likely secondary to side effects from treatment.  Objective:  Vitals:   08/18/17 0206 08/18/17 0624  BP: (!) 91/49 103/61  Pulse: 99 89  Resp: 18 18  Temp: 100.1 F (37.8 C) 98.6 F (37 C)  SpO2: 95% 95%     Intake/Output Summary (Last 24 hours) at 08/18/2017 0818 Last data filed at 08/17/2017 1656 Gross per 24 hour  Intake 800 ml  Output -  Net 800 ml    GENERAL: Appears somewhat sedated, in mild to moderate distress from pain and appears uncomfortable OROPHARYNX: Dry mucous membrane is noted LUNGS: clear to auscultation and percussion with normal breathing effort HEART: regular rate & rhythm and no murmurs and no lower extremity edema ABDOMEN:abdomen soft, distended with ascites and appears tense, minimum bowel sounds Musculoskeletal:no cyanosis of digits and no clubbing  NEURO: alert & oriented x 3 with fluent speech, no focal motor/sensory deficits   Labs:  Lab Results  Component Value Date   WBC 6.1 08/17/2017   HGB 12.0 08/17/2017   HCT 34.5 (L) 08/17/2017   MCV 93.8 08/17/2017   PLT 154 08/17/2017   NEUTROABS 4.1 08/17/2017    Lab Results  Component Value Date   NA 136 08/17/2017   K 3.5 08/17/2017   CL 103 08/17/2017   CO2 25 08/17/2017    Studies:  Dg Abd 1 View  Result Date: 08/17/2017 CLINICAL DATA:  Abdominal distension. EXAM: ABDOMEN - 1 VIEW COMPARISON:  August 16, 2017 FINDINGS: Stable small bowel obstruction.  No other interval changes. IMPRESSION: Stable small bowel obstruction. Electronically Signed   By: Dorise Bullion III M.D   On: 08/17/2017 07:48

## 2017-08-18 NOTE — Progress Notes (Signed)
[facility] Progress Note     Subjective: CC- SBO Patient states that her pain is stable today relative to the past 2 days. She denies any current emesis; smoldering nausea. No BM; ?flatus.  Objective: Vital signs in last 24 hours: Temp:  [98.6 F (37 C)-100.1 F (37.8 C)] 98.6 F (37 C) (04/29 0624) Pulse Rate:  [89-114] 89 (04/29 0624) Resp:  [16-18] 18 (04/29 0624) BP: (91-130)/(49-86) 103/61 (04/29 0624) SpO2:  [93 %-98 %] 95 % (04/29 0624)    Intake/Output from previous day: 04/28 0701 - 04/29 0700 In: 800 [I.V.:400; IV Piggyback:400] Out: -  Intake/Output this shift: No intake/output data recorded.  PE: Gen:  Alert, NAD HEENT: EOM's intact, pupils equal and round Card:  RRR, no M/G/R heard Pulm:  effort normal Abd: Soft, moderate distension, mildly tender, +BS Psych: A&Ox3  Skin: no rashes noted, warm and dry  Lab Results:  Recent Labs    08/16/17 0344 08/17/17 0323  WBC 8.5 6.1  HGB 12.1 12.0  HCT 35.1* 34.5*  PLT 147* 154   BMET Recent Labs    08/16/17 0344 08/17/17 0323  NA 137 136  K 3.2* 3.5  CL 103 103  CO2 23 25  GLUCOSE 101* 111*  BUN 11 8  CREATININE 0.61 0.67  CALCIUM 8.0* 8.2*   PT/INR No results for input(s): LABPROT, INR in the last 72 hours. CMP     Component Value Date/Time   NA 136 08/17/2017 0323   K 3.5 08/17/2017 0323   CL 103 08/17/2017 0323   CO2 25 08/17/2017 0323   GLUCOSE 111 (H) 08/17/2017 0323   BUN 8 08/17/2017 0323   CREATININE 0.67 08/17/2017 0323   CREATININE 0.80 08/13/2017 1343   CALCIUM 8.2 (L) 08/17/2017 0323   PROT 5.5 (L) 08/17/2017 0323   PROT 6.7 11/28/2016 0849   ALBUMIN 2.5 (L) 08/17/2017 0323   ALBUMIN 4.3 11/28/2016 0849   AST 21 08/17/2017 0323   AST 14 08/13/2017 1343   ALT 20 08/17/2017 0323   ALT 13 08/13/2017 1343   ALKPHOS 81 08/17/2017 0323   BILITOT 0.7 08/17/2017 0323   BILITOT 0.7 08/13/2017 1343   GFRNONAA >60 08/17/2017 0323   GFRNONAA >60 08/13/2017 1343    GFRAA >60 08/17/2017 0323   GFRAA >60 08/13/2017 1343   Lipase     Component Value Date/Time   LIPASE 39 08/13/2017 1728       Studies/Results: Dg Abd 1 View  Result Date: 08/17/2017 CLINICAL DATA:  Abdominal distension. EXAM: ABDOMEN - 1 VIEW COMPARISON:  August 16, 2017 FINDINGS: Stable small bowel obstruction.  No other interval changes. IMPRESSION: Stable small bowel obstruction. Electronically Signed   By: Dorise Bullion III M.D   On: 08/17/2017 07:48    Anti-infectives: Anti-infectives (From admission, onward)   None       Assessment/Plan H/o CAD s/p cardiac stenting 09/2016 - on brillinta Depression/anxiety Constipation Code status DNR  Colon cancer with peritoneal carcinomatosis - followed by [name], currently undergoing chemo. Appreciate inpatient oncology consult, palliative care team has also been consulted for goals of care and are following as well SBO likely 2/2 carcinomatosis - CT scan shows small bowel dilatation compatible with small bowel obstruction with probable zone of transition in the right hemipelvis secondary to pelvic mass like abnormality that may reflect peritoneal carcinomatosis for localized pelvic necrotic adenopathy. There is also omental thickening as well as ascites going along with carcinomatosis. - Very unfortunate case. I had a  long discussion with the patient today and her niece. She reports clearly understanding all of the above and the poor prognosis associated with this. She already expressed understanding based on discussions with my partners that surgery is likely to do more harm than good in the setting of carcinomatosis and would be highly unlikely to change the course of events in her case. - Agree with PCA to calculate opioid need for adequate pain control.  ID - none FEN - IVF, NPO except ice chips and sips with meds VTE - SCDs Foley - none   LOS: 5 days    Carol Ferguson , [facility]  Surgery 08/18/2017, 9:23 AM

## 2017-08-18 NOTE — Progress Notes (Signed)
Daily Progress Note   Patient Name: Carol Ferguson       Date: 08/18/2017 DOB: August 13, 1950  Age: 67 y.o. MRN#: [id number] Attending Physician: Kerney Elbe, DO Primary Care Physician: [name], PA-C Admit Date: 08/13/2017  Reason for Consultation/Follow-up: Establishing goals of care and Terminal Care  Subjective: Patient awake and alert. Feels "miserable" this afternoon and has been "nauseated all day." She recently vomited and states small amount of relief, but remains nauseous. She asks about scheduling medicine for anxiety.   Niece, Rachel Moulds, at bedside. Inquired about patient's thoughts regarding NGT for comfort and relief from nausea. Patient declines. Rachel Moulds speaks of her disliking NGT last hospitalization. Clarified goal for "comfort." Rachel Moulds states "we just want her comfortable." Discussed scheduling medications that may help with nausea but make her sleepy. Patient again speaks of being ok with sleepiness/drowsiness if she is comfortable and free from nausea. Rachel Moulds agrees and understands medications may make her sleepy.   Medication adjustments completed and updated patient at bedside. This time, nephew, [name] at bedside. He also agreed with plan to focus on her comfort. We discussed discontinuing interventions not aimed at comfort including cardiac monitor. Patient agrees.   Emotional support provided. PMT contact information given.    Length of Stay: 5  Current Medications: Scheduled Meds:  . aspirin  81 mg Oral Daily  . haloperidol lactate  2 mg Intravenous Q6H  . HYDROmorphone   Intravenous Q4H  . LORazepam  0.5 mg Intravenous Q4H  . mouth rinse  15 mL Mouth Rinse BID  . ticagrelor  90 mg Oral BID    Continuous Infusions: . dextrose 5 % and 0.9% NaCl 100  mL/hr at 08/18/17 0936    PRN Meds: acetaminophen **OR** acetaminophen, haloperidol lactate, LORazepam, nitroGLYCERIN, ondansetron (ZOFRAN) IV, sodium chloride flush  Physical Exam  Constitutional: She is oriented to person, place, and time. She is cooperative. She appears ill.  HENT:  Head: Normocephalic and atraumatic.  Pulmonary/Chest: No accessory muscle usage. No tachypnea. No respiratory distress.  Abdominal: She exhibits distension. There is tenderness.  Neurological: She is alert and oriented to person, place, and time.  Skin: Skin is warm and dry.  Nursing note and vitals reviewed.          Vital Signs: BP 116/78 (BP Location: Left Arm)  Pulse 94   Temp 98.9 F (37.2 C) (Oral)   Resp 18   Ht 5\' 5"  (1.651 m)   Wt 55.6 kg (122 lb 9.2 oz)   SpO2 97%   BMI 20.40 kg/m  SpO2: SpO2: 97 % O2 Device: O2 Device: Room Air O2 Flow Rate:    Intake/output summary:   Intake/Output Summary (Last 24 hours) at 08/18/2017 1407 Last data filed at 08/17/2017 1656 Gross per 24 hour  Intake 400 ml  Output -  Net 400 ml   LBM:   Baseline Weight: Weight: 55.6 kg (122 lb 9.2 oz) Most recent weight: Weight: 55.6 kg (122 lb 9.2 oz)       Palliative Assessment/Data: PPS 20%   Flowsheet Rows     Most Recent Value  Intake Tab  Referral Department  Hospitalist  Unit at Time of Referral  Oncology Unit  Date Notified  08/14/17  Palliative Care Type  New Palliative care  Reason for referral  Advance Care Planning, Pain  Date of Admission  08/13/17  Date first seen by Palliative Care  08/16/17  # of days Palliative referral response time  2 Day(s)  # of days IP prior to Palliative referral  1  Clinical Assessment  Palliative Performance Scale Score  30%  Psychosocial & Spiritual Assessment  Palliative Care Outcomes  Patient/Family meeting held?  Yes  Who was at the meeting?  patient and nephew  Palliative Care Outcomes  Improved pain interventions, Clarified goals of care       Patient Active Problem List   Diagnosis Date Noted  . Cancer associated pain   . Palliative care by specialist   . Protein-calorie malnutrition, severe 08/15/2017  . Other constipation 08/04/2017  . Peripheral neuropathy due to chemotherapy ([place]) 08/04/2017  . Mild protein-calorie malnutrition ([place]) 08/04/2017  . Anxiety state 07/16/2017  . Nausea without vomiting 07/16/2017  . Weight loss 07/16/2017  . Colon cancer ([place]) 07/15/2017  . Goals of care, counseling/discussion 07/15/2017  . SBO (small bowel obstruction) ([place]) 07/12/2017  . Left tubo-ovarian mass 07/02/2017  . Peritoneal carcinomatosis ([place]) 07/02/2017  . Diverticulosis 01/10/2017  . Cervical mass 12/27/2016  . CAD (coronary artery disease), native coronary artery 11/27/2016  . Chronic diastolic heart failure ([place]) 09/27/2016  . Hyperlipidemia with target LDL less than 70 09/27/2016  . 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 Lumbar degenerative disc disease 06/14/2016    Palliative Care Assessment & Plan   Patient Profile: 67 y.o. female  with past medical history of peritoneal carcinomatosis (dx 06/2017) being treated with FOLFOX (last dose 08/06/17), h/o bowel obstruction, peripheral neuropathy from chemotherapy, CAD with h/o STEMI s/p DES to LCx (09/2016), chronic nausea, mild protein-calorie malnutrion admitted on 08/13/2017 with SBO. Patient has been evaluated by surgery and oncology and felt not to be a surgical candidate. She is felt to have a poor prognosis in the event that SBO does not resolve with conservative management. Palliative care has been asked to consult to help address goals of care.   Assessment: SBO Peritoneal carcinomatosis Abdominal pain Nausea Anxiety state Severe protein calorie malnutrition Hx of CAD and ischemic cardiomyopathy  Recommendations/Plan:  Continue conservative management of SBO. Declines NGT for comfort/relief from nausea.  DNR/DNI  Symptom  management  Continue Dilaudid PCA: 0.2mg  q31min prn. 1mg /hr lockout. No basal rate. Loading dose 0.5mg . Pain controlled.   Haldol 2mg  IV q6h scheduled for nausea  Ativan 0.5mg  IV q4h scheduled for anxiety and may help with  nausea  Haldol 2mg  q3h IV prn refractory n/v  Ativan 0.5-1mg  IV q4h prn anxiety/sleep  Zofran 4mg  IV q6h prn n/v  Discontinued interventions not aimed at comfort including cardiac monitoring.   Pending disposition: likely residential hospice facility but will ensure her comfort with current medication regimen prior to recommending discharge to hospice.  Code Status: DNR   Code Status Orders  (From admission, onward)        Start     Ordered   08/16/17 0943  Do not attempt resuscitation (DNR)  Continuous    Question Answer Comment  In the event of cardiac or respiratory ARREST Do not call a "code blue"   In the event of cardiac or respiratory ARREST Do not perform Intubation, CPR, defibrillation or ACLS   In the event of cardiac or respiratory ARREST Use medication by any route, position, wound care, and other measures to relive pain and suffering. May use oxygen, suction and manual treatment of airway obstruction as needed for comfort.      08/16/17 0943    Code Status History    Date Active Date Inactive Code Status Order ID Comments User Context   08/13/2017 2253 08/16/2017 0943 Full Code [id number]  Rise Patience, MD ED   07/12/2017 1003 07/14/2017 2015 Full Code [id number]  Elwin Mocha, MD Inpatient   09/26/2016 1147 09/28/2016 1438 Full Code [id number]  Belva Crome, MD Inpatient   09/26/2016 1147 09/26/2016 1147 Full Code [id number]  Leanor Kail, PA Inpatient    Advance Directive Documentation     Most Recent Value  Type of Advance Directive  Healthcare Power of Attorney  Pre-existing out of facility DNR order (yellow form or pink MOST form)  -  "MOST" Form in Place?  -       Prognosis:   Poor prognosis with small bowel obstruction  not a surgical candidate secondary to peritoneal carcinomatosis   Discharge Planning:   To Be Determined likely hospice facility  Care plan was discussed with RN, patient, niece, nephew, Dr. Domingo Cocking, Dr. Alfredia Ferguson   Thank you for allowing the Palliative Medicine Team to assist in the care of this patient.   Time In: 1330 Time Out: 1420 Total Time 68min Prolonged Time Billed no      Greater than 50%  of this time was spent counseling and coordinating care related to the above assessment and plan.  Ihor Dow, FNP-C Palliative Medicine Team  Phone: [phone] Fax: [phone]  Please contact Palliative Medicine Team phone at [phone] for questions and concerns.

## 2017-08-18 NOTE — Progress Notes (Signed)
PROGRESS NOTE    Carol Ferguson  BHA:[id number] DOB: [birth year] DOA: 08/13/2017 PCP: Carol Douglas, PA-C   Brief Narrative:  Carol Ferguson is a 67 y.o. female with history of peritoneal carcinomatosis being followed by Oncology [name] and is receiving chemotherapy, CAD status post stenting for ST elevation MI in June 2018 on aspirin and Brilinta presents to the ER because of persistent abdominal pain.  Patient has been having abdominal pain over the last 1 week.  This is a 1 week following the chemotherapy.  Had multiple bouts of vomiting 5 days ago following which patient has not had much food and has not had any further episodes of vomiting.  Last bowel movement was around 3 days ago.  Pain is mostly diffuse and sometimes colicky appearance denies any chest pain or shortness of breath but tender with abdominal pain radiates to her chest.  In the ER patient had CT abdomen and pelvis which show concern for small bowel obstruction and General Surgery was consulted for further evaluation and recommendations.  Because there may not be any good surgical options General Surgery recommended Oncology input for evaluation and recommendations. Oncology evaluated and feel that if she does not improve from this or her partial small bowel obstruction becomes complete, comfort cares would potentially be necessitated as well as considering a venting G-tube. Gastroenterology consulted for goal about endoscopic stenting and this is not possible based on her location she is a nonsurgical candidate palliative care would be the best option.  Patient discussed with gastroenterology about her overall situation and patient feels she may be ready to transition to comfort care  Palliative care has been involved and met with the patient after Gastroenterology. She is leaning toward transitioning to comfort measures currently and is Dilaudid PCA.  Continues to be extremely nauseous however will need to get nausea  under control prior to transitioning to residential hospice.  All other interventions not aimed at comfort were discontinued by palliative care and comfort care order set has been utilized.  He was given Haldol and Ativan for her nausea it was still significant.  She will likely discharge to residential hospice facility but will need to ensure comfort with the current medication regimen prior to her discharge into hospice.  Assessment & Plan:   Principal Problem:   SBO (small bowel obstruction) (HCC) Active Problems:   CAD (coronary artery disease), native coronary artery   Peritoneal carcinomatosis (HCC)   Colon cancer (HCC)   Goals of care, counseling/discussion   Anxiety state   Non-intractable vomiting with nausea   Protein-calorie malnutrition, severe   Cancer related pain   Palliative care by specialist  SBO with a History of peritoneal Carcinomatosis -CT Abd/Pelvis showed The findings within the abdomen and pelvis are similar to that of 07/12/2017. There has been redevelopment of a small to moderate volume of ascites, small bowel dilatation compatible with small bowel obstruction with probable zone of transition in the right hemipelvis secondary to pelvic mass like abnormality that may reflect peritoneal carcinomatosis for localized pelvic necrotic adenopathy. Redemonstration of omental disease with induration and thickening.  -General Surgery Consulted and appreciate further recommendations  -Patient is kept NPO except medications and sips and will continue for now -Continue with D5 Normal Saline of 100 mL's -Continue Dilaudid PCA -Continue with Antiemetics with Zofran 4 mg p.o./IV as well as 5 mg of IV Compazine every 6 as needed for nausea and vomiting that is refractory to Zofran -Urgent care adjusting oral  medications as patient was extremely nauseous this morning.  They placed her on IV Haldol as well as Ativan -Plan is to manage conservatively at this time.   -General Surgery  is concerned that these issues were recurrent and they do not think there is an operative solution to this however they may consider a venting G-tube for palliation but patient has refused  -Comfort measures have been initiated all medications or interventions not related to comfort have been discontinued. -We will need to address patient's nausea. -Patient's primary oncologist evaluated today and recommended that no more chemotherapy will be tried and focus on palliative measures only  History of CAD and Ischemic Cardiomyopathy status post stenting in June 2018  -Continue with Aspirin 81 mg p.o. daily, Atorvastatin 10 mg p.o. nightly, Brilinta 90 mg p.o. twice daily for now until patient is full comfort transitioning to hospice  Peritoneal Carcinomatosis  -Likely GI in Origin from -Being followed by Oncology [name] currently undergoing chemotherapy with her next dose on 4/29 -Now transitioning likely towards full comfort and will address nausea before discharging to residential hospice   Depression and Anxiety -On IV lorazepam 0.5-1 mg every 4 hours as needed for anxiety  Hyperglycemia -Likely Reactive -Checked Hemoglobin A1c and was 5.6 -Will not check blood sugar further  Hypomagnesemia  -We will not repeat lab work as patient is leaning towards comfort care  Hypokalemia Not repeat lab work as patient is leaning towards comfort care  Severe protein calorie malnutrition in the context of chronic illness. -Patient remains n.p.o. at this time and goals of care discussions will be had if patient does not improve significantly.  May go on comfort measures however she does not wish palliation would initiate TPN for nutrition supplementation  DVT prophylaxis: SCDs. Code Status: FULL CODE Family Communication: No family present at bedside Disposition Plan: Transition to residential hospice if nausea is controlled  Consultants:  General Surgery  Medical Oncology Palliative Care  Medicine    Procedures: None   Antimicrobials:  Anti-infectives (From admission, onward)   None     Subjective: Seen and examined at bedside and was slightly out of it.  Complained of significant nausea without was unrelenting.  States that she vomited this morning.  Appeared comfortable with pain however her biggest concern was a large.  No lightheadedness or dizziness at this time.  Objective: Vitals:   08/18/17 0953 08/18/17 1150 08/18/17 1355 08/18/17 1427  BP:   116/78   Pulse:   94   Resp: _0 Temp:   98.9 F (37.2 C)   TempSrc:   Oral   SpO2: 95%  97% 97%  Weight:      Height:        Intake/Output Summary (Last 24 hours) at 08/18/2017 2059 Last data filed at 08/18/2017 1824 Gross per 24 hour  Intake 1200 ml  Output -  Net 1200 ml   Filed Weights   08/13/17 2311  Weight: 55.6 kg (122 lb 9.2 oz)   Examination: Physical Exam:  Constitutional: Thin Caucasian female who was complaining of nausea and is somewhat drowsy.  Appears calm and in NAD. Eyes: Sclera anicteric.  Lids normal ENMT: Ears and nose appear normal Neck: Supple with no JVD Respiratory: Diminished to auscultation bilaterally however no appreciable wheezing, rales, rhonchi. Cardiovascular: Regular rate and rhythm.  No appreciable lower extremity edema Abdomen: Soft however extremely tender to palpate.  Slightly distended.  Diminished bowel sounds GU: Deferred Musculoskeletal: No contractures or  cyanosis Skin: Warm and dry with no appreciable rashes or lesions on limited skin evaluation Neurologic: Cranial nerves II through XII grossly intact with no appreciable focal deficit Psychiatric: Somewhat drowsy but awake enough to answer questions.  Intact judgment and insight  Data Reviewed: I have personally reviewed following labs and imaging studies  CBC: Recent Labs  Lab 08/13/17 1343 08/14/17 0433 08/15/17 0410 08/16/17 0344 08/17/17 0323  WBC 8.4 6.1 6.8 8.5 6.1  NEUTROABS 6.4   --  4.8 6.2 4.1  HGB 14.0 12.8 12.1 12.1 12.0  HCT 40.9 37.2 35.4* 35.1* 34.5*  MCV 95.9 95.1 94.7 94.4 93.8  PLT 236 200 181 147* 916   Basic Metabolic Panel: Recent Labs  Lab 08/13/17 1343 08/14/17 0433 08/15/17 0410 08/16/17 0344 08/17/17 0323  NA 137 137 137 137 136  K 3.6 3.5 3.7 3.2* 3.5  CL 102 104 103 103 103  CO2 32* _0 GLUCOSE 99 115* 95 101* 111*  BUN 28* 25* _1 CREATININE 0.80 0.79 0.63 0.61 0.67  CALCIUM 9.1 8.4* 8.3* 8.0* 8.2*  MG  --  1.7 1.6* 1.8 1.6*  PHOS  --  3.5 3.1 3.1 3.0   GFR: Estimated Creatinine Clearance: 58.3 mL/min (by C-G formula based on SCr of 0.67 mg/dL). Liver Function Tests: Recent Labs  Lab 08/13/17 1343 08/15/17 0410 08/16/17 0344 08/17/17 0323  AST _2 ALT _3 ALKPHOS 86 94 90 81  BILITOT 0.7 0.7 0.9 0.7  PROT 6.6 5.4* 5.2* 5.5*  ALBUMIN 3.2* 2.6* 2.5* 2.5*   Recent Labs  Lab 08/13/17 1728  LIPASE 39   No results for input(s): AMMONIA in the last 168 hours. Coagulation Profile: No results for input(s): INR, PROTIME in the last 168 hours. Cardiac Enzymes: No results for input(s): CKTOTAL, CKMB, CKMBINDEX, TROPONINI in the last 168 hours. BNP (last 3 results) No results for input(s): PROBNP in the last 8760 hours. HbA1C: Recent Labs    08/16/17 0344  HGBA1C 5.6   CBG: Recent Labs  Lab 08/17/17 0000 08/17/17 0758 08/17/17 1558 08/18/17 0004 08/18/17 0815  GLUCAP 107* 117* 123* 121* 120*   Lipid Profile: No results for input(s): CHOL, HDL, LDLCALC, TRIG, CHOLHDL, LDLDIRECT in the last 72 hours. Thyroid Function Tests: No results for input(s): TSH, T4TOTAL, FREET4, T3FREE, THYROIDAB in the last 72 hours. Anemia Panel: No results for input(s): VITAMINB12, FOLATE, FERRITIN, TIBC, IRON, RETICCTPCT in the last 72 hours. Sepsis Labs: Recent Labs  Lab 08/13/17 1739  LATICACIDVEN 0.71    Recent Results (from the past 240 hour(s))  Urine culture     Status: Abnormal    Collection Time: 08/13/17  7:06 PM  Result Value Ref Range Status   Specimen Description   Final    URINE, CLEAN CATCH Performed at [facility] [address], [place], [place] [address]    Special Requests   Final    NONE Performed at [facility], [place] [address], [place], [place] [address]    Culture 20,000 COLONIES/mL ENTEROCOCCUS FAECALIS (A)  Final   Report Status 08/16/2017 FINAL  Final   Organism ID, Bacteria ENTEROCOCCUS FAECALIS (A)  Final      Susceptibility   Enterococcus faecalis - MIC*    AMPICILLIN <=2 SENSITIVE Sensitive     LEVOFLOXACIN 2 SENSITIVE Sensitive     NITROFURANTOIN <=16 SENSITIVE Sensitive     VANCOMYCIN 2 SENSITIVE Sensitive     *  20,000 COLONIES/mL ENTEROCOCCUS FAECALIS    Radiology Studies: Dg Abd 1 View  Result Date: 08/17/2017 CLINICAL DATA:  Abdominal distension. EXAM: ABDOMEN - 1 VIEW COMPARISON:  August 16, 2017 FINDINGS: Stable small bowel obstruction.  No other interval changes. IMPRESSION: Stable small bowel obstruction. Electronically Signed   By: Dorise Bullion III M.D   On: 08/17/2017 07:48   Scheduled Meds: . aspirin  81 mg Oral Daily  . haloperidol lactate  2 mg Intravenous Q6H  . HYDROmorphone   Intravenous Q4H  . LORazepam  0.5 mg Intravenous Q4H  . mouth rinse  15 mL Mouth Rinse BID  . ticagrelor  90 mg Oral BID   Continuous Infusions: . dextrose 5 % and 0.9% NaCl 100 mL/hr at 08/18/17 1808    LOS: 5 days   Kerney Elbe, DO [facility] Pager [phone]  If 7PM-7AM, please contact night-coverage [email or web address] Password [facility] 08/18/2017, 8:59 PM

## 2017-08-19 DIAGNOSIS — Z515 Encounter for palliative care: Secondary | ICD-10-CM

## 2017-08-19 DIAGNOSIS — R140 Abdominal distension (gaseous): Secondary | ICD-10-CM

## 2017-08-19 MED ORDER — HYDROMORPHONE HCL 1 MG/ML IJ SOLN
0.2500 mg | INTRAMUSCULAR | Status: DC
  Administered 2017-08-19 – 2017-08-21 (×10): 0.25 mg via INTRAVENOUS
  Filled 2017-08-19 (×10): qty 0.5

## 2017-08-19 MED ORDER — HYDROMORPHONE HCL 1 MG/ML IJ SOLN
0.5000 mg | INTRAMUSCULAR | Status: DC | PRN
  Administered 2017-08-19 – 2017-08-20 (×2): 1 mg via INTRAVENOUS
  Filled 2017-08-19 (×2): qty 1

## 2017-08-19 NOTE — Consult Note (Signed)
   [facility] CM Inpatient Consult   08/19/2017  Carol Ferguson 02/20/1951 [id number]   Patient screened for potential [facility] Care Management services due to extreme risk of unplanned readmission score.   However, after further chart review, [name] is currently on comfort measures only.   Therefore, there are no identifiable [facility] Care Management needs.   Marthenia Rolling, MSN-Ed, RN,BSN [facility] Liaison [phone]

## 2017-08-19 NOTE — Progress Notes (Signed)
CSW acknowledged consult for advance directives. CSW asked patient's RN for clarity, patient's RN agreed to check with patient. CSW informed by patient's RN that patient has no needs. CSW signing off, no other needs identified at this time.  Abundio Miu, [place] Social Worker [facility] Cell#: [phone]

## 2017-08-19 NOTE — Progress Notes (Signed)
Daily Progress Note   Patient Name: Carol Ferguson       Date: 08/19/2017 DOB: 08-16-1950  Age: 67 y.o. MRN#: [id number] Attending Physician: Kerney Elbe, DO Primary Care Physician: [name], PA-C Admit Date: 08/13/2017  Reason for Consultation/Follow-up: Establishing goals of care and Terminal Care  Subjective: Patient sleeping this afternoon.   Nephew, Carol Ferguson at bedside. He speaks of her being agitated and nauseous this morning but has been comfortable this afternoon. We discussed medication regimen for comfort including medications for pain, nausea, and anxiety. Carol Ferguson understands the medications are contributing to her drowsiness/periods of confusion but confirms goal for comfort. We also discussed discontinuing PCA pump since she has had low demands--Carol Ferguson agreeable to scheduling low dose IV dilaudid with prn dilaudid for breakthrough pain.   I also spoke with niece Carol Ferguson) via telephone. She speaks of her being more "forgetful" today but also understanding this is related to multiple medications to manage her high symptom burden. She confirms the goal of comfort for her aunt. She speaks of multiple neighbors coming to visit this morning/afternoon and Carol Ferguson understanding her neighbors are "coming to say goodbye."   Carol Ferguson recalls their conversation with [name] this morning. The patient wishes to continue IVF for one more day and may consider stopping tomorrow. Carol Ferguson believes Carol Ferguson is having a hard time discontinuing the IVF knowing it may be an intervention giving her a little more time. Carol Ferguson asks why her aunt's stomach seems more distended today. I explained possibility of worsening bowel obstruction with progressive cancer and/or IVF contributing to third spacing.     Carol Ferguson also asked about IVF when I was in the room. I explained Carol Ferguson's wishes to continue IVF for another day. I did explain that IVF are typically discontinued at EOL because they can be prolonging and also cause further discomfort with risk for fluid overload.  Emotional support provided to patient and family.   Length of Stay: 6  Current Medications: Scheduled Meds:  . haloperidol lactate  2 mg Intravenous Q6H  .  HYDROmorphone (DILAUDID) injection  0.25 mg Intravenous Q4H  . LORazepam  0.5 mg Intravenous Q4H  . mouth rinse  15 mL Mouth Rinse BID    Continuous Infusions: . dextrose 5 % and 0.9% NaCl 100 mL/hr at 08/18/17 2217  PRN Meds: acetaminophen **OR** acetaminophen, haloperidol lactate, HYDROmorphone (DILAUDID) injection, LORazepam, nitroGLYCERIN, ondansetron (ZOFRAN) IV, sodium chloride flush  Physical Exam  Constitutional: She is oriented to person, place, and time. She is sleeping. She is easily aroused. She appears ill.  HENT:  Head: Normocephalic and atraumatic.  Pulmonary/Chest: No accessory muscle usage. No tachypnea. No respiratory distress.  Abdominal: She exhibits distension. There is tenderness.  Neurological: She is oriented to person, place, and time and easily aroused.  Skin: Skin is warm and dry.  Nursing note and vitals reviewed.          Vital Signs: BP 114/67 (BP Location: Left Leg)   Pulse 85   Temp 98.3 F (36.8 C) (Oral)   Resp 16   Ht 5\' 5"  (1.651 m)   Wt 55.6 kg (122 lb 9.2 oz)   SpO2 99%   BMI 20.40 kg/m  SpO2: SpO2: 99 % O2 Device: O2 Device: Room Air O2 Flow Rate:    Intake/output summary:   Intake/Output Summary (Last 24 hours) at 08/19/2017 1556 Last data filed at 08/19/2017 1459 Gross per 24 hour  Intake 3258.33 ml  Output 1 ml  Net 3257.33 ml   LBM:   Baseline Weight: Weight: 55.6 kg (122 lb 9.2 oz) Most recent weight: Weight: 55.6 kg (122 lb 9.2 oz)       Palliative Assessment/Data: PPS 20%   Flowsheet Rows      Most Recent Value  Intake Tab  Referral Department  Hospitalist  Unit at Time of Referral  Oncology Unit  Date Notified  08/14/17  Palliative Care Type  New Palliative care  Reason for referral  Advance Care Planning, Pain  Date of Admission  08/13/17  Date first seen by Palliative Care  08/16/17  # of days Palliative referral response time  2 Day(s)  # of days IP prior to Palliative referral  1  Clinical Assessment  Palliative Performance Scale Score  20%  Psychosocial & Spiritual Assessment  Palliative Care Outcomes  Patient/Family meeting held?  Yes  Who was at the meeting?  patient, niece, nephew  Palliative Care Outcomes  Clarified goals of care, Improved pain interventions, Improved non-pain symptom therapy, Counseled regarding hospice, Provided psychosocial or spiritual support, Provided end of life care assistance      Patient Active Problem List   Diagnosis Date Noted  . Cancer related pain   . Palliative care by specialist   . Protein-calorie malnutrition, severe 08/15/2017  . Other constipation 08/04/2017  . Peripheral neuropathy due to chemotherapy ([place]) 08/04/2017  . Mild protein-calorie malnutrition ([place]) 08/04/2017  . Anxiety state 07/16/2017  . Non-intractable vomiting with nausea 07/16/2017  . Weight loss 07/16/2017  . Colon cancer ([place]) 07/15/2017  . Goals of care, counseling/discussion 07/15/2017  . SBO (small bowel obstruction) ([place]) 07/12/2017  . Left tubo-ovarian mass 07/02/2017  . Peritoneal carcinomatosis ([place]) 07/02/2017  . Diverticulosis 01/10/2017  . Cervical mass 12/27/2016  . CAD (coronary artery disease), native coronary artery 11/27/2016  . Chronic diastolic heart failure ([place]) 09/27/2016  . Hyperlipidemia with target LDL less than 70 09/27/2016  . 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 Lumbar degenerative disc disease 06/14/2016    Palliative Care Assessment & Plan   Patient Profile: 67 y.o. female  with past  medical history of peritoneal carcinomatosis (dx 06/2017) being treated with FOLFOX (last dose 08/06/17), h/o bowel obstruction, peripheral neuropathy from chemotherapy, CAD with h/o STEMI s/p DES to LCx (09/2016), chronic nausea, mild protein-calorie  malnutrion admitted on 08/13/2017 with SBO. Patient has been evaluated by surgery and oncology and felt not to be a surgical candidate. She is felt to have a poor prognosis in the event that SBO does not resolve with conservative management. Palliative care has been asked to consult to help address goals of care.   Assessment: SBO Peritoneal carcinomatosis Abdominal pain Nausea Anxiety state Severe protein calorie malnutrition Hx of CAD and ischemic cardiomyopathy  Recommendations/Plan:  Continue conservative management of SBO. Declines NGT for comfort/relief from nausea.  DNR/DNI  Comfort focused care.  Symptom management  Low PCA demand. Discontinued.  Scheduled Dilaudid 0.25mg  IV q4h pain  Dilaudid 0.5-1mg  IV q2h prn breakthrough pain/dyspnea/air hunger  Haldol 2mg  IV q6h scheduled for nausea  Ativan 0.5mg  IV q4h scheduled for anxiety and may help with nausea  Haldol 2mg  q3h IV prn refractory n/v  Ativan 0.5-1mg  IV q4h prn anxiety/sleep  Zofran 4mg  IV q6h prn n/v  Patient wishes to continue IVF for at least one more day. I did discuss with niece and nephew that this can be prolonging and also contribute to more discomfort with risk for fluid overload/third spacing.  Patient/family not yet ready for hospice referral/discharge. Will continue to discuss daily.   Code Status: DNR   Code Status Orders  (From admission, onward)        Start     Ordered   08/16/17 0943  Do not attempt resuscitation (DNR)  Continuous    Question Answer Comment  In the event of cardiac or respiratory ARREST Do not call a "code blue"   In the event of cardiac or respiratory ARREST Do not perform Intubation, CPR, defibrillation or ACLS   In the  event of cardiac or respiratory ARREST Use medication by any route, position, wound care, and other measures to relive pain and suffering. May use oxygen, suction and manual treatment of airway obstruction as needed for comfort.      08/16/17 0943    Code Status History    Date Active Date Inactive Code Status Order ID Comments User Context   08/13/2017 2253 08/16/2017 0943 Full Code [id number]  Rise Patience, MD ED   07/12/2017 1003 07/14/2017 2015 Full Code [id number]  Elwin Mocha, MD Inpatient   09/26/2016 1147 09/28/2016 1438 Full Code [id number]  Belva Crome, MD Inpatient   09/26/2016 1147 09/26/2016 1147 Full Code [id number]  Leanor Kail, PA Inpatient    Advance Directive Documentation     Most Recent Value  Type of Advance Directive  Healthcare Power of Attorney  Pre-existing out of facility DNR order (yellow form or pink MOST form)  -  "MOST" Form in Place?  -       Prognosis:   Poor prognosis with small bowel obstruction not a surgical candidate secondary to peritoneal carcinomatosis   Discharge Planning:   To Be Determined likely hospice facility  Care plan was discussed with RN, patient, niece, nephew,   Thank you for allowing the Palliative Medicine Team to assist in the care of this patient.   Time In: 1500 Time Out: 1535 Total Time 35 Prolonged Time Billed no      Greater than 50%  of this time was spent counseling and coordinating care related to the above assessment and plan.  Ihor Dow, FNP-C Palliative Medicine Team  Phone: [phone] Fax: [phone]  Please contact Palliative Medicine Team phone at [phone] for questions and concerns.

## 2017-08-19 NOTE — Progress Notes (Signed)
PROGRESS NOTE    Carol Ferguson  IZT:[id number] DOB: 23-Aug-1950 DOA: 08/13/2017 PCP: [name], PA-C   Brief Narrative:  Carol Ferguson is a 67 y.o. female with history of peritoneal carcinomatosis being followed by Oncology [name] and is receiving chemotherapy, CAD status post stenting for ST elevation MI in June 2018 on aspirin and Brilinta presents to the ER because of persistent abdominal pain.  Patient has been having abdominal pain over the last 1 week.  This is a 1 week following the chemotherapy.  Had multiple bouts of vomiting 5 days ago following which patient has not had much food and has not had any further episodes of vomiting.  Last bowel movement was around 3 days ago.  Pain is mostly diffuse and sometimes colicky appearance denies any chest pain or shortness of breath but tender with abdominal pain radiates to her chest.  In the ER patient had CT abdomen and pelvis which show concern for small bowel obstruction and General Surgery was consulted for further evaluation and recommendations.  Because there may not be any good surgical options General Surgery recommended Oncology input for evaluation and recommendations. Oncology evaluated and feel that if she does not improve from this or her partial small bowel obstruction or becomes complete, Comfort cares would potentially be necessitated as well as considering a venting G-tube. Gastroenterology consulted for question about endoscopic stenting and this is not possible based on her location she is a nonsurgical candidate palliative care would be the best option.  Patient discussed with Gastroenterology about her overall situation and patient feels she may be ready to transition to comfort care  Palliative care has been involved and met with the patient after Gastroenterology. She has been leaning toward transitioning to comfort measures currently and was on a Dilaudid PCA but is still not ready to move forward fully with  hospice. Continues to be extremely nauseous however still declines NGT.  All other interventions not aimed at comfort were discontinued by Palliative Care/Oncology and Comfort care order set has been utilized.  She was given Haldol and Ativan for her nausea it was still significant.  She will likely discharge to residential hospice facility but will need to ensure comfort with the current medication regimen prior to her discharge into hospice. Patient still not ready for Hospice at this time and wishes to continue IVF for at least 24 more hours and considering stopping tomorrow.   Assessment & Plan:   Principal Problem:   SBO (small bowel obstruction) (HCC) Active Problems:   CAD (coronary artery disease), native coronary artery   Peritoneal carcinomatosis (HCC)   Colon cancer (HCC)   Goals of care, counseling/discussion   Anxiety state   Non-intractable vomiting with nausea   Protein-calorie malnutrition, severe   Cancer related pain   Palliative care by specialist   Terminal care   Abdominal distention  SBO with a History of peritoneal Carcinomatosis -CT Abd/Pelvis showed The findings within the abdomen and pelvis are similar to that of 07/12/2017. There has been redevelopment of a small to moderate volume of ascites, small bowel dilatation compatible with small bowel obstruction with probable zone of transition in the right hemipelvis secondary to pelvic mass like abnormality that may reflect peritoneal carcinomatosis for localized pelvic necrotic adenopathy. Redemonstration of omental disease with induration and thickening.  -General Surgery Consulted was consulted and appreciated further recommendations but unfortunately patient is now a candidate for Operative Intervention  -Patient is kept NPO except medications and sips  and will continue for now -Continue with D5 Normal Saline at a rate of 100 mL/hr per patient request for at least 24 more hours -Dilaudid PCA discontinued and she is  getting IV Dilaudid 0.25 mg q4h scheduled and 0.5-1 mg q2hprn for Moderate Pain/Severe Pain/Dyspnea/Air Hunger -Continue with Antiemetics per Palliative Care and is now on IV Haldol 2 mg q6h and 2 mg IV q3hprn for Nausea and will also continue IV Lorazepam 0.5 mg q4h and 0.5-1 mg IV q4hprn Anxiety/Sleep -Plan is to manage conservatively at this time.   -General Surgery is signing off now that patient is transitioning to [place] measures have been initiated all medications or interventions not related to comfort have been discontinued. -Patient's Primary Oncologist evaluated again today and recommended that no more chemotherapy will be tried and focus on palliative measures and comfort only and has recommended scheduling IV Lorazepam -Poor Prognosis and In the Process of Transitioning to Hospice but possiblity of In Hospitalization Death   History of CAD and Ischemic Cardiomyopathy status post stenting in June 2018  -Discontinue Aspirin 81 mg p.o. daily, Atorvastatin 10 mg p.o. nightly, Brilinta 90 mg p.o. twice daily now that patient's goal is Comfort   Peritoneal Carcinomatosis  -Likely GI in Origin from -Being followed by Oncology [name] currently and was to undergo chemotherapy with her next dose on 4/29 but all Chemotherapy doses have been canceled -Now transitioning likely towards full comfort but not fully ready for hospice  Depression and Anxiety -Continue IV Lorazepam 0.5 mg q4h and 0.5-1 mg IV q4hprn Anxiety/Sleep  Hyperglycemia -Likely Reactive -Checked Hemoglobin A1c and was 5.6 -Will not check blood sugar further  Hypomagnesemia  -We will not repeat lab work as patient is leaning towards comfort care  Hypokalemia Not repeat lab work as patient is leaning towards comfort care  Severe protein calorie malnutrition in the context of chronic illness. -Patient remains n.p.o. at this time and goals of care discussions will be had if patient does not improve  significantly.  -Will continue IVF with D5W NS at 100 mL/hr at the patient's request as she wants to continue IVF for at least another 24 hours as she is not fully ready to transition to Residential Hospice.   DVT prophylaxis: SCDs. Code Status: FULL CODE Family Communication: No family present at bedside Disposition Plan: Transition to residential hospice if nausea is controlled  Consultants:  General Surgery  Medical Oncology Palliative Care Medicine    Procedures: None   Antimicrobials:  Anti-infectives (From admission, onward)   None     Subjective: Seen and examined at bedside resting comfortably.  States that she is very nauseous this morning and vomited.  Still does not want to stop IV fluid hydration at this time and still not ready to transition fully to residential hospice as she also give IV fluid hydration 24 hours.  Goal continues to remain comfort.  Objective: Vitals:   08/18/17 2111 08/19/17 0538 08/19/17 0843 08/19/17 1152  BP: 119/82 114/67    Pulse: 98 85    Resp: _0 Temp: 98.7 F (37.1 C) 98.3 F (36.8 C)    TempSrc: Oral Oral    SpO2: 98% 99%    Weight:      Height:        Intake/Output Summary (Last 24 hours) at 08/19/2017 1641 Last data filed at 08/19/2017 1459 Gross per 24 hour  Intake 3258.33 ml  Output 1 ml  Net 3257.33 ml  Filed Weights   08/13/17 2311  Weight: 55.6 kg (122 lb 9.2 oz)   Examination: Physical Exam:  Constitutional: Thin Caucasian female whose resting comfortably at this time and appears calm.  Was nauseous earlier but improved Eyes: Sclera anicteric.  Lids normal ENMT: External ears and nose appear normal Neck: Supple no JVD Respiratory: Diminished to auscultation however no appreciable wheezing, rales, rhonchi.  Unlabored breathing Cardiovascular: Regular rate and rhythm.  No extremity edema Abdomen: Soft, tender to palpate the abdomen.  Slightly distended.  Diminished bowel sounds GU:  Deferred Musculoskeletal: No contractures or cyanosis.  No joint deformities noted Skin: Dry with no appreciable rashes or lesions on limited skin evaluation Neurologic: No nerves II through XII grossly intact with no appreciable focal Psychiatric: Drowsy and resting comfortably.  Has periods of lucidity but does have some confusion  Data Reviewed: I have personally reviewed following labs and imaging studies  CBC: Recent Labs  Lab 08/13/17 1343 08/14/17 0433 08/15/17 0410 08/16/17 0344 08/17/17 0323  WBC 8.4 6.1 6.8 8.5 6.1  NEUTROABS 6.4  --  4.8 6.2 4.1  HGB 14.0 12.8 12.1 12.1 12.0  HCT 40.9 37.2 35.4* 35.1* 34.5*  MCV 95.9 95.1 94.7 94.4 93.8  PLT 236 200 181 147* 161   Basic Metabolic Panel: Recent Labs  Lab 08/13/17 1343 08/14/17 0433 08/15/17 0410 08/16/17 0344 08/17/17 0323  NA 137 137 137 137 136  K 3.6 3.5 3.7 3.2* 3.5  CL 102 104 103 103 103  CO2 32* _0 GLUCOSE 99 115* 95 101* 111*  BUN 28* 25* _1 CREATININE 0.80 0.79 0.63 0.61 0.67  CALCIUM 9.1 8.4* 8.3* 8.0* 8.2*  MG  --  1.7 1.6* 1.8 1.6*  PHOS  --  3.5 3.1 3.1 3.0   GFR: Estimated Creatinine Clearance: 58.3 mL/min (by C-G formula based on SCr of 0.67 mg/dL). Liver Function Tests: Recent Labs  Lab 08/13/17 1343 08/15/17 0410 08/16/17 0344 08/17/17 0323  AST _2 ALT _3 ALKPHOS 86 94 90 81  BILITOT 0.7 0.7 0.9 0.7  PROT 6.6 5.4* 5.2* 5.5*  ALBUMIN 3.2* 2.6* 2.5* 2.5*   Recent Labs  Lab 08/13/17 1728  LIPASE 39   No results for input(s): AMMONIA in the last 168 hours. Coagulation Profile: No results for input(s): INR, PROTIME in the last 168 hours. Cardiac Enzymes: No results for input(s): CKTOTAL, CKMB, CKMBINDEX, TROPONINI in the last 168 hours. BNP (last 3 results) No results for input(s): PROBNP in the last 8760 hours. HbA1C: No results for input(s): HGBA1C in the last 72 hours. CBG: Recent Labs  Lab 08/17/17 0000 08/17/17 0758  08/17/17 1558 08/18/17 0004 08/18/17 0815  GLUCAP 107* 117* 123* 121* 120*   Lipid Profile: No results for input(s): CHOL, HDL, LDLCALC, TRIG, CHOLHDL, LDLDIRECT in the last 72 hours. Thyroid Function Tests: No results for input(s): TSH, T4TOTAL, FREET4, T3FREE, THYROIDAB in the last 72 hours. Anemia Panel: No results for input(s): VITAMINB12, FOLATE, FERRITIN, TIBC, IRON, RETICCTPCT in the last 72 hours. Sepsis Labs: Recent Labs  Lab 08/13/17 1739  LATICACIDVEN 0.71    Recent Results (from the past 240 hour(s))  Urine culture     Status: Abnormal   Collection Time: 08/13/17  7:06 PM  Result Value Ref Range Status   Specimen Description   Final    URINE, CLEAN CATCH Performed at [facility] [address] [place], [place] [address]  Special Requests   Final    NONE Performed at [facility], [place] [address], [place], [place] [address]    Culture 20,000 COLONIES/mL ENTEROCOCCUS FAECALIS (A)  Final   Report Status 08/16/2017 FINAL  Final   Organism ID, Bacteria ENTEROCOCCUS FAECALIS (A)  Final      Susceptibility   Enterococcus faecalis - MIC*    AMPICILLIN <=2 SENSITIVE Sensitive     LEVOFLOXACIN 2 SENSITIVE Sensitive     NITROFURANTOIN <=16 SENSITIVE Sensitive     VANCOMYCIN 2 SENSITIVE Sensitive     * 20,000 COLONIES/mL ENTEROCOCCUS FAECALIS    Radiology Studies: No results found. Scheduled Meds: . haloperidol lactate  2 mg Intravenous Q6H  .  HYDROmorphone (DILAUDID) injection  0.25 mg Intravenous Q4H  . LORazepam  0.5 mg Intravenous Q4H  . mouth rinse  15 mL Mouth Rinse BID   Continuous Infusions: . dextrose 5 % and 0.9% NaCl 100 mL/hr at 08/18/17 2217    LOS: 6 days   Kerney Elbe, DO [facility] Pager [phone]  If 7PM-7AM, please contact night-coverage [email or web address] Password [facility] 08/19/2017, 4:41 PM

## 2017-08-19 NOTE — Progress Notes (Signed)
  [facility] Progress Note     Subjective: CC- nausea No change. Patient reports persistent abdominal pain, no better or worse. She has been nauseated and vomited once this morning. No flatus or BM.  Transitioning to comfort care.  Objective: Vital signs in last 24 hours: Temp:  [98.3 F (36.8 C)-98.9 F (37.2 C)] 98.3 F (36.8 C) (04/30 0538) Pulse Rate:  [85-98] 85 (04/30 0538) Resp:  [14-18] 14 (04/30 0843) BP: (109-119)/(58-82) 114/67 (04/30 0538) SpO2:  [94 %-99 %] 99 % (04/30 0538)    Intake/Output from previous day: 04/29 0701 - 04/30 0700 In: 2360 [I.V.:2360] Out: -  Intake/Output this shift: Total I/O In: 0  Out: 1 [Emesis/NG output:1]  PE: Gen: Alert, NAD, drowsy HEENT: EOM's intact, pupils equal and round Card: RRR, no M/G/R heard Pulm: effort normal Abd: Soft,mild distension, diffuse tenderness, +BS, no HSM, no hernia Skin: no rashes noted, warm and dry  Lab Results:  Recent Labs    08/17/17 0323  WBC 6.1  HGB 12.0  HCT 34.5*  PLT 154   BMET Recent Labs    08/17/17 0323  NA 136  K 3.5  CL 103  CO2 25  GLUCOSE 111*  BUN 8  CREATININE 0.67  CALCIUM 8.2*   PT/INR No results for input(s): LABPROT, INR in the last 72 hours. CMP     Component Value Date/Time   NA 136 08/17/2017 0323   K 3.5 08/17/2017 0323   CL 103 08/17/2017 0323   CO2 25 08/17/2017 0323   GLUCOSE 111 (H) 08/17/2017 0323   BUN 8 08/17/2017 0323   CREATININE 0.67 08/17/2017 0323   CREATININE 0.80 08/13/2017 1343   CALCIUM 8.2 (L) 08/17/2017 0323   PROT 5.5 (L) 08/17/2017 0323   PROT 6.7 11/28/2016 0849   ALBUMIN 2.5 (L) 08/17/2017 0323   ALBUMIN 4.3 11/28/2016 0849   AST 21 08/17/2017 0323   AST 14 08/13/2017 1343   ALT 20 08/17/2017 0323   ALT 13 08/13/2017 1343   ALKPHOS 81 08/17/2017 0323   BILITOT 0.7 08/17/2017 0323   BILITOT 0.7 08/13/2017 1343   GFRNONAA >60 08/17/2017 0323   GFRNONAA >60 08/13/2017 1343   GFRAA >60 08/17/2017 0323    GFRAA >60 08/13/2017 1343   Lipase     Component Value Date/Time   LIPASE 39 08/13/2017 1728       Studies/Results: No results found.  Anti-infectives: Anti-infectives (From admission, onward)   None       Assessment/Plan H/o CAD s/p cardiac stenting 09/2016 - on brillinta Depression/anxiety Constipation Code status DNR  Colon cancer with peritoneal carcinomatosis- followed by [name] and palliative team, transitioning to comfort care SBO likely 2/2 carcinomatosis - CT scan showssmall bowel dilatation compatible with small bowel obstruction with probable zone of transition in the right hemipelvis secondary to pelvic mass like abnormality that may reflect peritoneal carcinomatosis for localized pelvic necrotic adenopathy. There is also omental thickening as well as ascites going along with carcinomatosis. - poor prognosis, no good surgical option - Patient is transitioning to comfort care. Continue care per [name]. General surgery will sign off, please call with concerns.   LOS: 6 days    Wellington Hampshire , [facility] 08/19/2017, 9:30 AM Pager: [phone] Consults: [phone] Mon-Fri 7:00 am-4:30 pm Sat-Sun 7:00 am-11:30 am

## 2017-08-19 NOTE — Progress Notes (Signed)
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 She is currently on comfort measures only  Recurrent small bowel obstruction Severe nausea We discussed options including NG tube placement for venting gastrostomy tube.  The patient has declined these measures. In the meantime, she will continue IV fluids and antiemetics  Cancer associated pain She appears comfortable with Dilaudid PCA. Will continue the same  Occasional agitation I recommend scheduled IV lorazepam  Goals of care discussion We discussed the goals of care again The patient would like to continue IV fluids for 1 more day if possible We will continue palliative comfort measures Prognosis is poor Hospital death is a possibility   Heath Lark, MD 08/19/2017  8:22 AM   Subjective:  She is noted to be more lethargic.  Her niece is present.  She is falling asleep intermittently.  She stated that her pain is well controlled.  She has persistent nausea. To the best of her knowledge, her niece verified that all legal paperwork has been completed  Objective:  Vitals:   08/18/17 2111 08/19/17 0538  BP: 119/82 114/67  Pulse: 98 85  Resp: 16 18  Temp: 98.7 F (37.1 C) 98.3 F (36.8 C)  SpO2: 98% 99%     Intake/Output Summary (Last 24 hours) at 08/19/2017 9038 Last data filed at 08/19/2017 0600 Gross per 24 hour  Intake 2360 ml  Output -  Net 2360 ml    GENERAL: She appears sleepy but intermittently arousable. NEURO: no focal motor/sensory deficits   Labs:  Lab Results  Component Value Date   WBC 6.1 08/17/2017   HGB 12.0 08/17/2017   HCT 34.5 (L) 08/17/2017   MCV 93.8 08/17/2017   PLT 154 08/17/2017   NEUTROABS 4.1 08/17/2017    Lab Results  Component Value Date   NA 136 08/17/2017   K 3.5 08/17/2017   CL 103  08/17/2017   CO2 25 08/17/2017

## 2017-08-20 DIAGNOSIS — R109 Unspecified abdominal pain: Secondary | ICD-10-CM

## 2017-08-20 MED ORDER — ASPIRIN 81 MG PO CHEW
81.0000 mg | CHEWABLE_TABLET | Freq: Every day | ORAL | Status: DC
  Administered 2017-08-20 – 2017-08-21 (×2): 81 mg via ORAL
  Filled 2017-08-20 (×2): qty 1

## 2017-08-20 MED ORDER — TICAGRELOR 90 MG PO TABS
90.0000 mg | ORAL_TABLET | Freq: Two times a day (BID) | ORAL | Status: DC
  Administered 2017-08-20 – 2017-08-21 (×3): 90 mg via ORAL
  Filled 2017-08-20 (×3): qty 1

## 2017-08-20 NOTE — Progress Notes (Signed)
Nutrition Brief Note  Pt with metastatic colon cancer. She was initially seen by an RD on 4/26. Chart reviewed. Palliative following pt and she is now transitioning to comfort care only.  No further nutrition interventions warranted at this time. Please re-consult as needed.     Jarome Matin, MS, RD, LDN, [facility] Inpatient Clinical Dietitian Pager # [phone] After hours/weekend pager # [phone]

## 2017-08-20 NOTE — Progress Notes (Signed)
Daily Progress Note   Patient Name: Carol Ferguson       Date: 08/20/2017 DOB: [birth year]  Age: 67 y.o. MRN#: [id number] Attending Physician: Debbe Odea, MD Primary Care Physician: [name], PA-C Admit Date: 08/13/2017  Reason for Consultation/Follow-up: Establishing goals of care and Terminal Care  Subjective: Patient resting comfortably. Opens eyes to voice. Denies nausea or pain at this time. Comfortable on current medication regimen. Patient's friend at bedside.   Spoke with Rachel Moulds (niece and HCPOA) via telephone. She confirmed plan as discussed by patient and [name] this morning for IVF to be decreased today and discontinued tomorrow. Rachel Moulds is relieved that her aunt's symptoms are much better controlled and she is "peaceful." Also discussed plan for discharge to hospice facility possibly tomorrow if bed available. [facility] is the patient and niece's first choice for facility. [facility] will continue current medication regimen. If they will not continue IV symptom management medications, Rachel Moulds is interested in either staying at the hospital or trying another hospice facility.   Questions and concerns addressed. Emotional support provided.   Length of Stay: 7  Current Medications: Scheduled Meds:  . haloperidol lactate  2 mg Intravenous Q6H  .  HYDROmorphone (DILAUDID) injection  0.25 mg Intravenous Q4H  . LORazepam  0.5 mg Intravenous Q4H  . mouth rinse  15 mL Mouth Rinse BID    Continuous Infusions: . dextrose 5 % and 0.9% NaCl 50 mL/hr at 08/20/17 0844    PRN Meds: acetaminophen **OR** acetaminophen, haloperidol lactate, HYDROmorphone (DILAUDID) injection, LORazepam, nitroGLYCERIN, ondansetron (ZOFRAN) IV, sodium chloride flush  Physical  Exam  Constitutional: She is sleeping. She is easily aroused. She appears ill.  HENT:  Head: Normocephalic and atraumatic.  Pulmonary/Chest: No accessory muscle usage. No tachypnea. No respiratory distress.  Abdominal: There is no tenderness.  Neurological: She is easily aroused.  Wakes to voice. Drowsy  Skin: Skin is warm and dry.  Psychiatric: Her speech is delayed. She is inattentive.  Nursing note and vitals reviewed.          Vital Signs: BP 114/67 (BP Location: Left Leg)   Pulse 85   Temp 98.3 F (36.8 C) (Oral)   Resp 16   Ht 5\' 5"  (1.651 m)   Wt 55.6 kg (122 lb 9.2 oz)  SpO2 99%   BMI 20.40 kg/m  SpO2: SpO2: 99 % O2 Device: O2 Device: Room Air O2 Flow Rate:    Intake/output summary:   Intake/Output Summary (Last 24 hours) at 08/20/2017 1037 Last data filed at 08/20/2017 0600 Gross per 24 hour  Intake 2400 ml  Output -  Net 2400 ml   LBM:   Baseline Weight: Weight: 55.6 kg (122 lb 9.2 oz) Most recent weight: Weight: 55.6 kg (122 lb 9.2 oz)       Palliative Assessment/Data: PPS 20%   Flowsheet Rows     Most Recent Value  Intake Tab  Referral Department  Hospitalist  Unit at Time of Referral  Oncology Unit  Date Notified  08/14/17  Palliative Care Type  New Palliative care  Reason for referral  Advance Care Planning, Pain  Date of Admission  08/13/17  Date first seen by Palliative Care  08/16/17  # of days Palliative referral response time  2 Day(s)  # of days IP prior to Palliative referral  1  Clinical Assessment  Palliative Performance Scale Score  20%  Psychosocial & Spiritual Assessment  Palliative Care Outcomes  Patient/Family meeting held?  Yes  Who was at the meeting?  patient, niece, nephew  Palliative Care Outcomes  Clarified goals of care, Improved pain interventions, Improved non-pain symptom therapy, Counseled regarding hospice, Provided psychosocial or spiritual support, Provided end of life care assistance      Patient Active Problem  List   Diagnosis Date Noted  . Terminal care   . Abdominal distention   . Cancer related pain   . Palliative care by specialist   . Protein-calorie malnutrition, severe 08/15/2017  . Other constipation 08/04/2017  . 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 Mild protein-calorie malnutrition ([place]) 08/04/2017  . Anxiety state 07/16/2017  . Non-intractable vomiting with nausea 07/16/2017  . Weight loss 07/16/2017  . Colon cancer ([place]) 07/15/2017  . Goals of care, counseling/discussion 07/15/2017  . SBO (small bowel obstruction) ([place]) 07/12/2017  . Left tubo-ovarian mass 07/02/2017  . Peritoneal carcinomatosis ([place]) 07/02/2017  . Diverticulosis 01/10/2017  . Cervical mass 12/27/2016  . CAD (coronary artery disease), native coronary artery 11/27/2016  . Chronic diastolic heart failure ([place]) 09/27/2016  . Hyperlipidemia with target LDL less than 70 09/27/2016  . 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 Lumbar degenerative disc disease 06/14/2016    Palliative Care Assessment & Plan   Patient Profile: 67 y.o. female  with past medical history of peritoneal carcinomatosis (dx 06/2017) being treated with FOLFOX (last dose 08/06/17), h/o bowel obstruction, peripheral neuropathy from chemotherapy, CAD with h/o STEMI s/p DES to LCx (09/2016), chronic nausea, mild protein-calorie malnutrion admitted on 08/13/2017 with SBO. Patient has been evaluated by surgery and oncology and felt not to be a surgical candidate. She is felt to have a poor prognosis in the event that SBO does not resolve with conservative management. Palliative care has been asked to consult to help address goals of care.   Assessment: SBO Peritoneal carcinomatosis Abdominal pain Nausea Anxiety state Severe protein calorie malnutrition Hx of CAD and ischemic cardiomyopathy  Recommendations/Plan:  Continue conservative management of SBO. Declines NGT for comfort/relief from  nausea.  DNR/DNI  Comfort focused care.  Symptom management  Low PCA demand. Discontinued.  Scheduled Dilaudid 0.25mg  IV q4h pain  Dilaudid 0.5-1mg  IV q2h prn breakthrough pain/dyspnea/air hunger  Haldol 2mg  IV q6h scheduled for nausea  Ativan 0.5mg  IV q4h scheduled for anxiety  and may help with nausea  Haldol 2mg  q3h IV prn refractory n/v  Ativan 0.5-1mg  IV q4h prn anxiety/sleep  Zofran 4mg  IV q6h prn n/v  Per oncology, IVF reduced to 50cc/hr and plan is to discontinue 5/2.   SW consulted for residential hospice placement. Patient/niece request [facility] as first choice. If [facility] will not accept patient with current medication regimen, niece requesting [facility] as second option. SW updated.   Code Status: DNR   Code Status Orders  (From admission, onward)        Start     Ordered   08/16/17 0943  Do not attempt resuscitation (DNR)  Continuous    Question Answer Comment  In the event of cardiac or respiratory ARREST Do not call a "code blue"   In the event of cardiac or respiratory ARREST Do not perform Intubation, CPR, defibrillation or ACLS   In the event of cardiac or respiratory ARREST Use medication by any route, position, wound care, and other measures to relive pain and suffering. May use oxygen, suction and manual treatment of airway obstruction as needed for comfort.      08/16/17 0943    Code Status History    Date Active Date Inactive Code Status Order ID Comments User Context   08/13/2017 2253 08/16/2017 0943 Full Code [id number]  Rise Patience, MD ED   07/12/2017 1003 07/14/2017 2015 Full Code [id number]  Elwin Mocha, MD Inpatient   09/26/2016 1147 09/28/2016 1438 Full Code [id number]  Belva Crome, MD Inpatient   09/26/2016 1147 09/26/2016 1147 Full Code [id number]  Leanor Kail, PA Inpatient    Advance Directive Documentation     Most Recent Value  Type of Advance Directive  Healthcare Power of Attorney  Pre-existing out of facility DNR order  (yellow form or pink MOST form)  -  "MOST" Form in Place?  -       Prognosis:   Poor prognosis with small bowel obstruction not a surgical candidate secondary to peritoneal carcinomatosis   Discharge Planning:   Hospice facility   Care plan was discussed with RN, patient, niece, nephew  Thank you for allowing the Palliative Medicine Team to assist in the care of this patient.   Time In: 1020- Time Out: 1045 Total Time 4min Prolonged Time Billed no      Greater than 50%  of this time was spent counseling and coordinating care related to the above assessment and plan.  Ihor Dow, FNP-C Palliative Medicine Team  Phone: [phone] Fax: [phone]  Please contact Palliative Medicine Team phone at [phone] for questions and concerns.

## 2017-08-20 NOTE — Progress Notes (Signed)
Noted d/c plan residential hospice([facility])-CSW following. If too unstable to transfer consider GIP status.

## 2017-08-20 NOTE — Progress Notes (Signed)
PROGRESS NOTE    Carol Ferguson   YJE:[id number]  DOB: 10-Mar-1951  DOA: 08/13/2017 PCP: [name], PA-C   Brief Narrative:  Carol Ferguson is a 67 year old with colon cancer with peritoneal carcinomatosis who presents with a recurrent small bowel obstruction.  Her oncologist has been assisting during his hospital stay.  She has not improved and has been transitioned to comfort care on the most part however has been resistant to stop her IV fluids.   Subjective: Feels groggy today and is falling asleep mid conversation.  The severe nausea that she has been having has resolved.  No pain currently.  We have had a discussion in regards to going to [facility] and she is in agreement with this.  Her niece who is the power of attorney is at bedside and also agrees.    Assessment & Plan:   Principal Problem:   SBO (small bowel obstruction) (HCC) Active Problems:   CAD (coronary artery disease), native coronary artery   Peritoneal carcinomatosis (HCC)   Colon cancer (HCC)   Goals of care, counseling/discussion   Anxiety state   Nausea without vomiting   Protein-calorie malnutrition, severe   Cancer related pain   Terminal care   We will continue current medications being given to treat her pain and nausea.  She has a PICC line which she can be discharged with to hospice home.  Dr. Florene Glen has weaned IV fluids to 50 cc an hour today and has discussed that they will be discontinued tomorrow with the patient.  Plan is for her to be transitioned to [facility] as soon as a bed is available.  DVT prophylaxis: None Code Status: DNR Family Communication: Niece at bedside Disposition Plan: Transitioning to hospice home Consultants:   Oncology  Palliative care  General surgery Procedures:    Antimicrobials:  Anti-infectives (From admission, onward)   None       Objective: Vitals:   08/18/17 2111 08/19/17 0538 08/19/17 0843 08/19/17 1152  BP: 119/82 114/67      Pulse: 98 85    Resp: 16 18 14 16   Temp: 98.7 F (37.1 C) 98.3 F (36.8 C)    TempSrc: Oral Oral    SpO2: 98% 99%    Weight:      Height:        Intake/Output Summary (Last 24 hours) at 08/20/2017 1743 Last data filed at 08/20/2017 1500 Gross per 24 hour  Intake 2088.34 ml  Output -  Net 2088.34 ml   Filed Weights   08/13/17 2311  Weight: 55.6 kg (122 lb 9.2 oz)    Examination: General exam: Appears comfortable -very drowsy and falls asleep easily HEENT: PERRLA, oral mucosa moist, no sclera icterus or thrush Respiratory system: Clear to auscultation. Respiratory effort normal. Cardiovascular system: S1 & S2 heard, RRR.   Gastrointestinal system: Abdomen soft, moderately distended, bowel sounds positive mild diffuse tenderness  Central nervous system: Alert and oriented. No focal neurological deficits. Extremities: No cyanosis, clubbing or edema Skin: No rashes or ulcers Psychiatry: Flat affect    Data Reviewed: I have personally reviewed following labs and imaging studies  CBC: Recent Labs  Lab 08/14/17 0433 08/15/17 0410 08/16/17 0344 08/17/17 0323  WBC 6.1 6.8 8.5 6.1  NEUTROABS  --  4.8 6.2 4.1  HGB 12.8 12.1 12.1 12.0  HCT 37.2 35.4* 35.1* 34.5*  MCV 95.1 94.7 94.4 93.8  PLT 200 181 147* 637   Basic Metabolic Panel: Recent Labs  Lab 08/14/17 0433 08/15/17 0410 08/16/17 0344 08/17/17 0323  NA 137 137 137 136  K 3.5 3.7 3.2* 3.5  CL 104 103 103 103  CO2 27 25 23 25   GLUCOSE 115* 95 101* 111*  BUN 25* 15 11 8   CREATININE 0.79 0.63 0.61 0.67  CALCIUM 8.4* 8.3* 8.0* 8.2*  MG 1.7 1.6* 1.8 1.6*  PHOS 3.5 3.1 3.1 3.0   GFR: Estimated Creatinine Clearance: 58.3 mL/min (by C-G formula based on SCr of 0.67 mg/dL). Liver Function Tests: Recent Labs  Lab 08/15/17 0410 08/16/17 0344 08/17/17 0323  AST 30 21 21   ALT 28 22 20   ALKPHOS 94 90 81  BILITOT 0.7 0.9 0.7  PROT 5.4* 5.2* 5.5*  ALBUMIN 2.6* 2.5* 2.5*   No results for input(s): LIPASE,  AMYLASE in the last 168 hours. No results for input(s): AMMONIA in the last 168 hours. Coagulation Profile: No results for input(s): INR, PROTIME in the last 168 hours. Cardiac Enzymes: No results for input(s): CKTOTAL, CKMB, CKMBINDEX, TROPONINI in the last 168 hours. BNP (last 3 results) No results for input(s): PROBNP in the last 8760 hours. HbA1C: No results for input(s): HGBA1C in the last 72 hours. CBG: Recent Labs  Lab 08/17/17 0000 08/17/17 0758 08/17/17 1558 08/18/17 0004 08/18/17 0815  GLUCAP 107* 117* 123* 121* 120*   Lipid Profile: No results for input(s): CHOL, HDL, LDLCALC, TRIG, CHOLHDL, LDLDIRECT in the last 72 hours. Thyroid Function Tests: No results for input(s): TSH, T4TOTAL, FREET4, T3FREE, THYROIDAB in the last 72 hours. Anemia Panel: No results for input(s): VITAMINB12, FOLATE, FERRITIN, TIBC, IRON, RETICCTPCT in the last 72 hours. Urine analysis:    Component Value Date/Time   COLORURINE YELLOW 08/13/2017 1906   APPEARANCEUR CLEAR 08/13/2017 1906   LABSPEC 1.026 08/13/2017 1906   PHURINE 5.0 08/13/2017 1906   GLUCOSEU NEGATIVE 08/13/2017 1906   HGBUR MODERATE (A) 08/13/2017 1906   BILIRUBINUR NEGATIVE 08/13/2017 1906   BILIRUBINUR small (A) 12/03/2016 0917   KETONESUR 20 (A) 08/13/2017 1906   PROTEINUR NEGATIVE 08/13/2017 1906   UROBILINOGEN 0.2 12/03/2016 0917   UROBILINOGEN 0.2 08/06/2010 0246   NITRITE NEGATIVE 08/13/2017 1906   LEUKOCYTESUR NEGATIVE 08/13/2017 1906   Sepsis Labs: @LABRCNTIP (procalcitonin:4,lacticidven:4) ) Recent Results (from the past 240 hour(s))  Urine culture     Status: Abnormal   Collection Time: 08/13/17  7:06 PM  Result Value Ref Range Status   Specimen Description   Final    URINE, CLEAN CATCH Performed at [facility], [place] [address] [place], [place] [address]    Special Requests   Final    NONE Performed at [facility], [place] [address] [place], [place] [address]      Culture 20,000 COLONIES/mL ENTEROCOCCUS FAECALIS (A)  Final   Report Status 08/16/2017 FINAL  Final   Organism ID, Bacteria ENTEROCOCCUS FAECALIS (A)  Final      Susceptibility   Enterococcus faecalis - MIC*    AMPICILLIN <=2 SENSITIVE Sensitive     LEVOFLOXACIN 2 SENSITIVE Sensitive     NITROFURANTOIN <=16 SENSITIVE Sensitive     VANCOMYCIN 2 SENSITIVE Sensitive     * 20,000 COLONIES/mL ENTEROCOCCUS FAECALIS         Radiology Studies: No results found.    Scheduled Meds: . aspirin  81 mg Oral Daily  . haloperidol lactate  2 mg Intravenous Q6H  .  HYDROmorphone (DILAUDID) injection  0.25 mg Intravenous Q4H  . LORazepam  0.5 mg Intravenous Q4H  .  mouth rinse  15 mL Mouth Rinse BID  . ticagrelor  90 mg Oral BID   Continuous Infusions: . dextrose 5 % and 0.9% NaCl 50 mL/hr at 08/20/17 0844     LOS: 7 days    Time spent in minutes: 35    Debbe Odea, MD [facility] Pager: [email or web address] Password [facility] 08/20/2017, 5:43 PM

## 2017-08-20 NOTE — Progress Notes (Signed)
Carol Ferguson   DOB:1951/01/04   Meshilem Machuca#:[id number]    Assessment & Plan:   Recurrent small bowel obstruction secondary to peritoneal carcinomatosis from colon cancer She has accepted inability to tolerate further systemic chemotherapy. She is currently on comfort measures only  Recurrent small bowel obstruction Severe nausea, resolving She will continue IV antiemetics I have discussed with the patient numerous times about possibility of discontinuing IV fluids I felt that the IV fluids is contributing to third spacing and abdominal swelling She is in agreement to reduce IV fluid to half the rate at 50 cc/hour I will continue to work with her and plan to discuss possibility of discontinuation of IV fluids tomorrow  Cancer associated pain She appears comfortable with Dilaudid IV. Will continue the same  Occasional agitation I recommend scheduled IV lorazepam  Goals of care discussion We discussed the goals of care again The patient would like to continue IV fluids for 1 more day if possible We will continue palliative comfort measures Prognosis is poor Hospital death is a possibility  Heath Lark, MD 08/20/2017  7:39 AM   Subjective:  The patient appears to be somewhat more alert.  She denies worsening pain.  Nausea appears to be somewhat improved.  Her abdomen appears somewhat distended She continues to have questions in regards to plan of care.  Objective:  Vitals:   08/19/17 0843 08/19/17 1152  BP:    Pulse:    Resp: 14 16  Temp:    SpO2:       Intake/Output Summary (Last 24 hours) at 08/20/2017 0739 Last data filed at 08/20/2017 0600 Gross per 24 hour  Intake 2400 ml  Output 1 ml  Net 2399 ml    GENERAL:No distress and comfortable NEURO: alert but continues to fall asleep intermittently   Labs:  Lab Results  Component Value Date   WBC 6.1 08/17/2017   HGB 12.0 08/17/2017   HCT 34.5 (L) 08/17/2017   MCV 93.8 08/17/2017   PLT 154 08/17/2017   NEUTROABS 4.1  08/17/2017    Lab Results  Component Value Date   NA 136 08/17/2017   K 3.5 08/17/2017   CL 103 08/17/2017   CO2 25 08/17/2017

## 2017-08-20 NOTE — Plan of Care (Signed)
  Problem: Pain Managment: Goal: General experience of comfort will improve Outcome: Progressing   Problem: Safety: Goal: Ability to remain free from injury will improve Outcome: Progressing   

## 2017-08-20 NOTE — Progress Notes (Signed)
[facility] Liaison: RN visit  Received request from Abundio Miu, [place] for patient and family interest in [facility]. Chart reviewed and spoke with patient and family, niece Rachel Moulds, nephew [name], to acknowledge referral. Unfortunately [facility] is not able to offer a room today.  Patient, Family and CSW are aware [facility] Liaison will follow up with CSW and family tomorrow or sooner if a room becomes available.   Please do not hesitate to call with questions.  Thank you for this referral.  Farrel Gordon, RN, [place] hospital liaisons are on [place]

## 2017-08-21 MED ORDER — POLYETHYLENE GLYCOL 3350 17 G PO PACK: 17.0000 g | PACK | Freq: Every day | ORAL | 0 refills | Status: DC | PRN

## 2017-08-21 MED ORDER — SENNOSIDES-DOCUSATE SODIUM 8.6-50 MG PO TABS: 1.0000 | ORAL_TABLET | Freq: Every day | ORAL | 1 refills | Status: DC

## 2017-08-21 MED ORDER — LORAZEPAM 2 MG/ML IJ SOLN: 0.5000 mg | INTRAMUSCULAR | 0 refills | Status: DC | PRN

## 2017-08-21 MED ORDER — HEPARIN SOD (PORK) LOCK FLUSH 100 UNIT/ML IV SOLN
250.0000 [IU] | INTRAVENOUS | Status: AC | PRN
  Administered 2017-08-21: 250 [IU]

## 2017-08-21 MED ORDER — HYDROMORPHONE HCL 1 MG/ML IJ SOLN: 0.5000 mg | INTRAMUSCULAR | 0 refills | Status: DC | PRN

## 2017-08-21 MED ORDER — ONDANSETRON HCL 4 MG/2ML IJ SOLN: 4.0000 mg | Freq: Four times a day (QID) | INTRAMUSCULAR | 0 refills | Status: DC | PRN

## 2017-08-21 MED ORDER — HALOPERIDOL LACTATE 5 MG/ML IJ SOLN: 2.0000 mg | Freq: Four times a day (QID) | INTRAMUSCULAR | Status: DC

## 2017-08-21 MED ORDER — BISACODYL 10 MG RE SUPP: 10.0000 mg | RECTAL | 0 refills | Status: DC | PRN

## 2017-08-21 NOTE — Progress Notes (Signed)
CSW following to assist with discharge to [facility].  CSW faxed discharge summary to [facility].  [facility] contacted, patient's family notified at bedside Guadelupe Sabin).  Patient's RN can call report to [phone], packet complete.  CSW signing off, no other needs identified at this time.  Abundio Miu, [place] Social Worker [facility] Cell#: [phone]

## 2017-08-21 NOTE — Progress Notes (Signed)
PICC line flushed and capped for [facility].  RN called [facility] to give report, nurse unavailable.  Writer left number with nurse to call back when able.  Pt transported via [facility].

## 2017-08-21 NOTE — Progress Notes (Signed)
Carol Ferguson   DOB:10/09/1950   DJ#:[id number]    Assessment & Plan:  Recurrent small bowel obstruction secondary to peritoneal carcinomatosis from colon cancer She has accepted inability to tolerate further systemic chemotherapy. She is currently on comfort measures only but with improved symptoms, she is undecided.  Recurrent small bowel obstruction, resolving Severe nausea, resolving She will continue IV antiemetics I have discussed with the patient numerous times about possibility of discontinuing IV fluids  Overall, her symptoms of recurrent bowel obstruction is resolving  Cancer associated pain She appears comfortable with Dilaudid IV. Will continue the same  Goals of care discussion We discussed the goals of care again The patient is undecided about decision for IV fluids I spoke with her medical healthcare power of attorney and she would like the patient to try and clear liquids We discussed potential discharge to begin place versus going home on palliative care support with and without IV fluids I will defer to the patient and her niece for decision making I have discussed this with primary service If she decides to go home, I will schedule outpatient follow-up for her  Heath Lark, MD 08/21/2017  8:39 AM   Subjective:  Patient continues to improve clinically.  She had spontaneous bowel movement this morning.  Her nausea has resolved.  She has minimum pain.  She appears to be more alert The patient is undecided about her decision regarding goals of care, whether to discontinue IV fluids and transition to comfort measures versus continuing aggressive IV fluid support along with everything else.  Objective:  Vitals:   08/19/17 1152 08/21/17 0511  BP:  (!) 94/57  Pulse:  89  Resp: 16 20  Temp:  98.2 F (36.8 C)  SpO2:  95%     Intake/Output Summary (Last 24 hours) at 08/21/2017 0839 Last data filed at 08/21/2017 0700 Gross per 24 hour  Intake 1386.67 ml  Output -   Net 1386.67 ml    GENERAL:alert, no distress and comfortable.  She appears to be more alert today SKIN: skin color, texture, turgor are normal, no rashes or significant lesions EYES: normal, Conjunctiva are pink and non-injected, sclera clear OROPHARYNX: Noted dry mucous membrane NECK: supple, thyroid normal size, non-tender, without nodularity LYMPH:  no palpable lymphadenopathy in the cervical, axillary or inguinal LUNGS: clear to auscultation and percussion with normal breathing effort HEART: regular rate & rhythm and no murmurs and no lower extremity edema ABDOMEN:abdomen soft, abdominal distention has improved significantly. Musculoskeletal:no cyanosis of digits and no clubbing  NEURO: alert & oriented x 3 with fluent speech, no focal motor/sensory deficits   Labs:  Lab Results  Component Value Date   WBC 6.1 08/17/2017   HGB 12.0 08/17/2017   HCT 34.5 (L) 08/17/2017   MCV 93.8 08/17/2017   PLT 154 08/17/2017   NEUTROABS 4.1 08/17/2017    Lab Results  Component Value Date   NA 136 08/17/2017   K 3.5 08/17/2017   CL 103 08/17/2017   CO2 25 08/17/2017

## 2017-08-21 NOTE — Care Management Note (Signed)
Case Management Note  Patient Details  Name: Carol Ferguson MRN: [id number] Date of Birth: November 21, 1950  Subjective/Objective: CSW following for [place].                   Action/Plan:d/c [facility] Facility[facility]   Expected Discharge Date:  08/21/17               Expected Discharge Plan:  [place]  In-House Referral:  Clinical Social Work  Discharge planning Services  CM Consult  Post Acute Care Choice:    Choice offered to:     DME Arranged:    DME Agency:     HH Arranged:    [place] Agency:     Status of Service:  Completed, signed off  If discussed at [employer], dates discussed:    Additional Comments:  Dessa Phi, RN 08/21/2017, 10:53 AM

## 2017-08-21 NOTE — Clinical Social Work Note (Signed)
Clinical Social Work Assessment  Patient Details  Name: Carol Ferguson MRN: [id number] Date of Birth: Jun 02, 1950  Date of referral:  08/20/17               Reason for consult:  End of Life/Hospice                Permission sought to share information with:  Other([facility]) Permission granted to share information::  Yes, Verbal Permission Granted  Name::        Agency::  [facility]  Relationship::     Contact Information:     Housing/Transportation Living arrangements for the past 2 months:  [place] of Information:  Patient, Other (Comment Required)(Nephew - Geneticist, molecular) Patient Interpreter Needed:  None Criminal Activity/Legal Involvement Pertinent to Current Situation/Hospitalization:  No - Comment as needed Significant Relationships:  Other Family Members Lives with:  Self Do you feel safe going back to the place where you live?    Need for family participation in patient care:  Yes (Comment)  Care giving concerns:  Patient from home. Palliative following and recommended residential hospice.   Social Worker assessment / plan:  CSW spoke with patient/patient's nephew [name]) regarding interest in residential hospice. Patient reported that she prefers [facility] if they are be to continue her current medication regimen, patient's nephew agreed. CSW explained referral process and agreed to make referral.  CSW contact [facility] RN staff member [facility]) and made referral.  CSW will continue to follow and assist with patient's transition to residential hospice.  Employment status:  Retired Forensic scientist:  Medicare PT Recommendations:  Not assessed at this time [place] / Referral to community resources:  Other (Comment Required)([facility])  Patient/Family's Response to care:  Patient thanked CSW for assistance with [facility] referral.  Patient/Family's Understanding of and Emotional Response to Diagnosis, Current Treatment,  and Prognosis:  Patient presented calm and verbalized plan to dc to residential hospice. Patient accompanied by nephew, patient's nephew in agreement.   Emotional Assessment Appearance:  Appears stated age Attitude/Demeanor/Rapport:    Affect (typically observed):  Calm, Quiet Orientation:  Oriented to Self, Oriented to Situation, Oriented to Place, Oriented to  Time Alcohol / Substance use:  Not Applicable Psych involvement (Current and /or in the community):  No (Comment)  Discharge Needs  Concerns to be addressed:  Care Coordination Readmission within the last 30 days:  No Current discharge risk:  Terminally ill Barriers to Discharge:  No Barriers Identified   Burnis Medin, LCSW 08/21/2017, 9:15 AM

## 2017-08-21 NOTE — Discharge Summary (Signed)
Physician Discharge Summary  Carol Ferguson ZHY:[id number] DOB: June 29, 1950 DOA: 08/13/2017  PCP: [name], PA-C  Admit date: 08/13/2017 Discharge date: 08/21/2017  Admitted From: home  Disposition:  Hospice home   Recommendations for Outpatient Follow-up:  1. Continue current meds. If she improves, advance diet please.  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 She continues to require IV Haldol, Ativan and Dilaudid to keep her symptoms controlled.   Brief Summary: Carol Ferguson is a 67 year old with colon cancer with peritoneal carcinomatosis who presents with a recurrent small bowel obstruction.  Her oncologist has been assisting during his hospital stay.     Hospital Course:  Principal Problem:   SBO (small bowel obstruction) and nausea due to peritoneal carcinomatosis from colon cancer   Protein-calorie malnutrition, severe   Cancer related pain   Terminal care    CAD (coronary artery disease), native coronary artery   Anxiety state  We will continue current medications being given to treat her pain and nausea.  She has a PICC line which she can be discharged with to hospice home.  Her nausea is improving and she has had a small BM. She has been started on clears by Dr Alvy Bimler.  Hospice was dicussed and agreed upon a number of days ago. Plan is for her to be transitioned to [facility]  place today as there is a bed available. Although it appears her current SBO may be resolving, she may continue to  have issues with eating due to her peritoneal metastatsis. The patient is still wanting to go to [facility] and per [facility] Starleen Arms, she should be ok to transition there today. Dr Alvy Bimler and I have discussed the plan. If the patient is to improve, she will be discharged to home from the hospice home. Dr Alvy Bimler will speak with her niece (POA) in the upcoming week to see if there has been any improvement.      Discharge Exam: Vitals:   08/19/17 1152 08/21/17 0511  BP:  (!) 94/57  Pulse:  89  Resp: 16 20  Temp:  98.2 F (36.8 C)  SpO2:  95%   Vitals:   08/19/17 0538 08/19/17 0843 08/19/17 1152 08/21/17 0511  BP: 114/67   (!) 94/57  Pulse: 85   89  Resp: 18 14 16 20   Temp: 98.3 F (36.8 C)   98.2 F (36.8 C)  TempSrc: Oral   Oral  SpO2: 99%   95%  Weight:      Height:        General: Pt is alert, awake, not in acute distress Cardiovascular: RRR, S1/S2 +, no rubs, no gallops Respiratory: CTA bilaterally, no wheezing, no rhonchi Abdominal: Soft, NT, ND, bowel sounds + Extremities: no edema, no cyanosis   Discharge Instructions  Discharge Instructions    Discharge diet:   Complete by:  As directed    Clear liquids. Advance to solids if tolerated.     Allergies as  of 08/21/2017      Reactions   Flagyl [metronidazole] Nausea Only      Medication List    STOP taking these medications   dicyclomine 10 MG capsule Commonly known as:  BENTYL   FLUoxetine 10 MG capsule Commonly known as:  PROZAC   glycerin adult 2 g suppository   HYDROcodone-acetaminophen 5-325 MG tablet Commonly known as:  NORCO   LORazepam 0.5 MG tablet Commonly known as:  ATIVAN Replaced by:  LORazepam 2 MG/ML injection   magic mouthwash w/lidocaine Soln   nitroGLYCERIN 0.4 MG SL tablet Commonly known as:  NITROSTAT   ondansetron 8 MG tablet Commonly known as:  ZOFRAN Replaced by:  ondansetron 4 MG/2ML Soln injection   prochlorperazine 10 MG tablet Commonly known as:   COMPAZINE   traZODone 50 MG tablet Commonly known as:  DESYREL     TAKE these medications   aspirin 81 MG chewable tablet Chew 1 tablet (81 mg total) by mouth daily.   atorvastatin 10 MG tablet Commonly known as:  LIPITOR Take 1 tablet (10 mg total) by mouth daily.   bisacodyl 10 MG suppository Commonly known as:  DULCOLAX Place 1 suppository (10 mg total) rectally as needed for moderate constipation.   BRILINTA 90 MG Tabs tablet Generic drug:  ticagrelor TAKE 1 TABLET BY MOUTH TWICE A DAY   haloperidol lactate 5 MG/ML injection Commonly known as:  HALDOL Inject 0.4 mLs (2 mg total) into the vein every 6 (six) hours.   HYDROmorphone 1 MG/ML injection Commonly known as:  DILAUDID Inject 0.5-1 mLs (0.5-1 mg total) into the vein every 2 (two) hours as needed for moderate pain or severe pain (dyspnea/air hunger).   LORazepam 2 MG/ML injection Commonly known as:  ATIVAN Inject 0.25-0.5 mLs (0.5-1 mg total) into the vein every 4 (four) hours as needed for anxiety (sleep). Replaces:  LORazepam 0.5 MG tablet   ondansetron 4 MG/2ML Soln injection Commonly known as:  ZOFRAN Inject 2 mLs (4 mg total) into the vein every 6 (six) hours as needed for nausea, vomiting or refractory nausea / vomiting. Replaces:  ondansetron 8 MG tablet   polyethylene glycol packet Commonly known as:  MIRALAX Take 17 g by mouth daily as needed for mild constipation.   senna-docusate 8.6-50 MG tablet Commonly known as:  Senokot-S Take 1 tablet by mouth daily.       Allergies  Allergen Reactions  . Flagyl [Metronidazole] Nausea Only     Procedures/Studies:    Dg Abd 1 View  Result Date: 08/17/2017 CLINICAL DATA:  Abdominal distension. EXAM: ABDOMEN - 1 VIEW COMPARISON:  August 16, 2017 FINDINGS: Stable small bowel obstruction.  No other interval changes. IMPRESSION: Stable small bowel obstruction. Electronically Signed   By: Dorise Bullion III M.D   On: 08/17/2017 07:48   Dg Abd 1  View  Result Date: 08/14/2017 CLINICAL DATA:  Abdominal distention and pain EXAM: ABDOMEN - 1 VIEW COMPARISON:  CT abdomen and pelvis of 08/13/2016 FINDINGS: There is still gaseous distention of small bowel loops consistent with a persistent partial small bowel obstruction. Some contrast is noted in the right abdomen possibly within the right colon. Surgical clips are noted in the right upper quadrant from prior cholecystectomy. IMPRESSION: Persistent partial small bowel obstruction. Electronically Signed   By: Ivar Drape M.D.   On: 08/14/2017 10:25   Ct Abdomen Pelvis W Contrast  Result Date: 08/13/2017 CLINICAL DATA:  67 year old female with history of colon cancer presents with nausea and  vomiting as well as progressive abdominal pain. Concern for small bowel obstruction. EXAM: CT ABDOMEN AND PELVIS WITH CONTRAST TECHNIQUE: Multidetector CT imaging of the abdomen and pelvis was performed using the standard protocol following bolus administration of intravenous contrast. CONTRAST:  19mL ISOVUE-300 IOPAMIDOL (ISOVUE-300) INJECTION 61%, 140mL ISOVUE-300 IOPAMIDOL (ISOVUE-300) INJECTION 61% COMPARISON:  CT from 07/18/2017 and 07/27/2017 FINDINGS: Lower chest: Normal size heart without pericardial effusion. Pulmonary mass or pulmonary consolidation. No effusion. Hepatobiliary: Status post cholecystectomy. Mild intrahepatic ductal dilatation appears chronic and likely related to reservoir effect cholecystectomy. No choledocholithiasis. No focal liver lesion. Pancreas: No pancreatic mass or pathologic ductal dilatation. Spleen: Normal size spleen without mass. Adrenals/Urinary Tract: Stable mild nodular appearance of the left adrenal apex. Symmetric enhancement of both kidneys with water attenuating 13 mm cyst in the upper pole of the left kidney. No nephrolithiasis nor hydroureteronephrosis. Nondistended urinary bladder without focal mural thickening, mass or calculus. Stomach/Bowel: The stomach is distended  with enteric contrast. No intraluminal mass or focal mural thickening. There are fluid-filled dilated small bowel loops out of proportion to nondistended large bowel, caliber up to 2.7 cm and consistent with changes of high-grade small bowel obstruction. Suspect that the transition zone is within the pelvis given irregularly enhancing masslike opacities within the lower pelvis concerning for changes of possible peritoneal carcinomatosis and/or necrotic adenopathy. The colon is decompressed in appearance. Interval redevelopment of a small to moderate volume of ascites within the upper abdomen with redemonstration of omental edema and thickening. Vascular/Lymphatic: Nonaneurysmal atherosclerotic aorta and branch vessels. Reproductive: Mixed solid and cystic masslike abnormality in the left adnexa possibly of ovarian etiology similar in appearance to prior. Similar in appearance uterus and right adnexa. Other: Masslike abnormality in the right hemipelvis is redemonstrated, margins are ill-defined and difficult to delineate. This may be contributing to the patient's small-bowel obstruction either from adhesion or direct localized involvement of small bowel. Musculoskeletal: No aggressive osseous lesions. Degenerative changes along the dorsal spine. IMPRESSION: 1. The findings within the abdomen and pelvis are similar to that of 07/12/2017. There has been redevelopment of a small to moderate volume of ascites, small bowel dilatation compatible with small bowel obstruction with probable zone of transition in the right hemipelvis secondary to pelvic mass like abnormality that may reflect peritoneal carcinomatosis for localized pelvic necrotic adenopathy. 2. Redemonstration of omental disease with induration and thickening. Electronically Signed   By: Ashley Royalty M.D.   On: 08/13/2017 20:19   Ct Abdomen Pelvis W Contrast  Result Date: 07/27/2017 CLINICAL DATA:  Recently diagnosed with gastric cancer, unable to have  surgery. Patient started her first chemotherapy treatment. Patient states vomiting bile earlier today. EXAM: CT ABDOMEN AND PELVIS WITH CONTRAST TECHNIQUE: Multidetector CT imaging of the abdomen and pelvis was performed using the standard protocol following bolus administration of intravenous contrast. CONTRAST:  157mL ISOVUE-300 IOPAMIDOL (ISOVUE-300) INJECTION 61% COMPARISON:  July 12, 2017 FINDINGS: Lower chest: No acute abnormality. Hepatobiliary: The liver is normal without focal liver lesion. Patient status post prior cholecystectomy. Mild postsurgical intrahepatic biliary ductal dilatation is identified. The common bile duct is normal. Pancreas: Unremarkable. No pancreatic ductal dilatation or surrounding inflammatory changes. Spleen: Normal in size without focal abnormality. Adrenals/Urinary Tract: Hypertrophy of left adrenal gland is unchanged. The right adrenal gland is normal. Stable left kidney cyst in the upper pole is noted. The kidneys are otherwise unremarkable. There is no hydronephrosis bilaterally. The bladder is normal. Stomach/Bowel: There is wall thickening of the posterior fundal stomach. There is no  obstruction of the stomach. There is no small bowel obstruction. No colonic obstruction is identified. The appendix is normal. The sigmoid colon is decompressed thickened bowel wall. Vascular/Lymphatic: Aortic atherosclerosis. No enlarged abdominal or pelvic lymph nodes. Reproductive: Mixed cystic and solid mass in the left ovary is unchanged. The uterus and right ovary are normal. Other: There is interval resolution of previously noted ascites. Omental nodularities of the left lower quadrant and right abdomen are unchanged. Musculoskeletal: No acute or significant osseous findings. Degenerative joint changes of the spine are noted. IMPRESSION: The previously noted ascites in the abdomen and pelvis has resolved. 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 No small bowel  or colonic obstruction is identified. Generalized thickened bowel wall of the sigmoid colon. Mixed cystic and solid mass of the left ovary is unchanged. Status post prior cholecystectomy with mild postsurgical intrahepatic biliary ductal dilatation. Electronically Signed   By: Abelardo Diesel M.D.   On: 07/27/2017 20:29   Dg Abd Acute W/chest  Result Date: 07/27/2017 CLINICAL DATA:  Colorectal cancer.  Nausea and vomiting. EXAM: DG ABDOMEN ACUTE W/ 1V CHEST COMPARISON:  07/14/2017 FINDINGS: The lungs are clear without focal pneumonia, edema, pneumothorax or pleural effusion. The cardiopericardial silhouette is within normal limits for size. Nodular opacity overlying the right lower lung is compatible with a nipple shadow as confirmed on upright abdomen film. Right PICC line tip overlies the mid to distal SVC. Upright film shows no evidence for intraperitoneal free air. Supine film shows no gaseous small bowel dilatation to suggest obstruction. Moderate stool volume seen along the length of the colon. Visualized bony anatomy unremarkable. IMPRESSION: 1. No acute cardiopulmonary findings. 2. No evidence for small bowel obstruction. No intraperitoneal free air. 3. Moderate stool volume. Electronically Signed   By: Misty Stanley M.D.   On: 07/27/2017 18:09   Dg Abd Portable 1v  Result Date: 08/16/2017 CLINICAL DATA:  Small bowel obstruction. EXAM: PORTABLE ABDOMEN - 1 VIEW COMPARISON:  Abdominal x-ray from yesterday. FINDINGS: Unchanged mildly dilated small bowel loops in the central abdomen. Contrast is again noted within the cecum. No acute osseous abnormality. IMPRESSION: 1. Unchanged small bowel obstruction. Electronically Signed   By: Titus Dubin M.D.   On: 08/16/2017 09:04   Dg Abd Portable 1v  Result Date: 08/15/2017 CLINICAL DATA:  Abdominal pain.  Small-bowel obstruction. EXAM: PORTABLE ABDOMEN - 1 VIEW COMPARISON:  CT abdomen and pelvis 08/13/2017. Single-view of the abdomen 08/14/2017. FINDINGS:  Contrast material seen in the ascending colon. There is persistent gaseous distention of small bowel with loops measuring up to approximately 4.3 cm, unchanged. No new abnormality. IMPRESSION: No change in findings consistent with partial small bowel obstruction. Electronically Signed   By: Inge Rise M.D.   On: 08/15/2017 09:24     The results of significant diagnostics from this hospitalization (including imaging, microbiology, ancillary and laboratory) are listed below for reference.     Microbiology: Recent Results (from the past 240 hour(s))  Urine culture     Status: Abnormal   Collection Time: 08/13/17  7:06 PM  Result Value Ref Range Status   Specimen Description   Final    URINE, CLEAN CATCH Performed at [facility], [place] [address], [place], [place] [address]    Special Requests   Final    NONE Performed at [facility],  [address], [place], [place] [address]    Culture 20,000 COLONIES/mL ENTEROCOCCUS FAECALIS (A)  Final   Report Status 08/16/2017  FINAL  Final   Organism ID, Bacteria ENTEROCOCCUS FAECALIS (A)  Final      Susceptibility   Enterococcus faecalis - MIC*    AMPICILLIN <=2 SENSITIVE Sensitive     LEVOFLOXACIN 2 SENSITIVE Sensitive     NITROFURANTOIN <=16 SENSITIVE Sensitive     VANCOMYCIN 2 SENSITIVE Sensitive     * 20,000 COLONIES/mL ENTEROCOCCUS FAECALIS     Labs: BNP (last 3 results) No results for input(s): BNP in the last 8760 hours. Basic Metabolic Panel: Recent Labs  Lab 08/15/17 0410 08/16/17 0344 08/17/17 0323  NA 137 137 136  K 3.7 3.2* 3.5  CL 103 103 103  CO2 25 23 25   GLUCOSE 95 101* 111*  BUN 15 11 8   CREATININE 0.63 0.61 0.67  CALCIUM 8.3* 8.0* 8.2*  MG 1.6* 1.8 1.6*  PHOS 3.1 3.1 3.0   Liver Function Tests: Recent Labs  Lab 08/15/17 0410 08/16/17 0344 08/17/17 0323  AST 30 21 21   ALT 28 22 20   ALKPHOS 94 90 81  BILITOT 0.7 0.9 0.7  PROT 5.4* 5.2* 5.5*  ALBUMIN 2.6* 2.5*  2.5*   No results for input(s): LIPASE, AMYLASE in the last 168 hours. No results for input(s): AMMONIA in the last 168 hours. CBC: Recent Labs  Lab 08/15/17 0410 08/16/17 0344 08/17/17 0323  WBC 6.8 8.5 6.1  NEUTROABS 4.8 6.2 4.1  HGB 12.1 12.1 12.0  HCT 35.4* 35.1* 34.5*  MCV 94.7 94.4 93.8  PLT 181 147* 154   Cardiac Enzymes: No results for input(s): CKTOTAL, CKMB, CKMBINDEX, TROPONINI in the last 168 hours. BNP: Invalid input(s): POCBNP CBG: Recent Labs  Lab 08/17/17 0000 08/17/17 0758 08/17/17 1558 08/18/17 0004 08/18/17 0815  GLUCAP 107* 117* 123* 121* 120*   D-Dimer No results for input(s): DDIMER in the last 72 hours. Hgb A1c No results for input(s): HGBA1C in the last 72 hours. Lipid Profile No results for input(s): CHOL, HDL, LDLCALC, TRIG, CHOLHDL, LDLDIRECT in the last 72 hours. Thyroid function studies No results for input(s): TSH, T4TOTAL, T3FREE, THYROIDAB in the last 72 hours.  Invalid input(s): FREET3 Anemia work up No results for input(s): VITAMINB12, FOLATE, FERRITIN, TIBC, IRON, RETICCTPCT in the last 72 hours. Urinalysis    Component Value Date/Time   COLORURINE YELLOW 08/13/2017 1906   APPEARANCEUR CLEAR 08/13/2017 1906   LABSPEC 1.026 08/13/2017 1906   PHURINE 5.0 08/13/2017 1906   GLUCOSEU NEGATIVE 08/13/2017 1906   HGBUR MODERATE (A) 08/13/2017 1906   BILIRUBINUR NEGATIVE 08/13/2017 1906   BILIRUBINUR small (A) 12/03/2016 0917   KETONESUR 20 (A) 08/13/2017 1906   PROTEINUR NEGATIVE 08/13/2017 1906   UROBILINOGEN 0.2 12/03/2016 0917   UROBILINOGEN 0.2 08/06/2010 0246   NITRITE NEGATIVE 08/13/2017 1906   LEUKOCYTESUR NEGATIVE 08/13/2017 1906   Sepsis Labs Invalid input(s): PROCALCITONIN,  WBC,  LACTICIDVEN Microbiology Recent Results (from the past 240 hour(s))  Urine culture     Status: Abnormal   Collection Time: 08/13/17  7:06 PM  Result Value Ref Range Status   Specimen Description   Final    URINE, CLEAN CATCH Performed  at [facility], [place] [address], [place], [place] [address]    Special Requests   Final    NONE Performed at [facility], [place] [address], [place], [place] [address]    Culture 20,000 COLONIES/mL ENTEROCOCCUS FAECALIS (A)  Final   Report Status 08/16/2017 FINAL  Final   Organism ID, Bacteria ENTEROCOCCUS FAECALIS (A)  Final  Susceptibility   Enterococcus faecalis - MIC*    AMPICILLIN <=2 SENSITIVE Sensitive     LEVOFLOXACIN 2 SENSITIVE Sensitive     NITROFURANTOIN <=16 SENSITIVE Sensitive     VANCOMYCIN 2 SENSITIVE Sensitive     * 20,000 COLONIES/mL ENTEROCOCCUS FAECALIS     Time coordinating discharge: 60 min  SIGNED:   Debbe Odea, MD  [facility] 08/21/2017, 11:06 AM Pager   If 7PM-7AM, please contact night-coverage [email or web address] Password [email or web address]

## 2017-08-21 NOTE — Progress Notes (Signed)
PT Cancellation Note  Patient Details Name: Carol Ferguson MRN: [id number] DOB: 27-Jul-1950   Cancelled Treatment:    Reason Eval/Treat Not Completed: PT screened, no needs identified, will sign off. Inappropriate order. Pt to transfer to [facility]. Will sign off.    Weston Anna, MPT Pager: [phone]

## 2017-08-21 NOTE — Progress Notes (Signed)
[facility] room available today for patient. She completed her paperwork for transfer today with HCPOA/Niece Shanna present. Please leave PICC in place for continued use at [facility]. Appreciate help from bedside RN, Dr. Wynelle Cleveland and [facility] Juliann Pulse.   Please send discharge summary to [phone].  RN please call report to [phone].  Thank you,  Erling Conte, LCSW [phone]

## 2017-08-21 NOTE — Progress Notes (Signed)
[facility])  [place] room is available today for patient. Sent message to [place] making her aware. Will follow up with patient/family shortly to complete paper work for transfer.   Thank you. Erling Conte, [place]

## 2017-08-21 NOTE — Progress Notes (Signed)
   08/21/17 1100  Clinical Encounter Type  Visited With Patient and family together  Visit Type Follow-up;Spiritual support   Niece is still present as they await transport to [facility].  Patient a little more alert and knows she will have good care.  Supported patient and niece.   Chaplain Katherene Ponto

## 2017-08-21 NOTE — Progress Notes (Signed)
   08/21/17 0955  Clinical Encounter Type  Visited With Patient and family together;Health care provider  Visit Type Follow-up;Spiritual support  Spiritual Encounters  Spiritual Needs Prayer   Rounding on Palliative Patients and following on visit from last week.  The niece of the patient was at bedside.  Indicated patient will be going to [facility].  Patient is on pain medicine and dozing on and off, but seemed comfortable.  We prayed together and I will follow up before she gets transferred.   Chaplain Katherene Ponto

## 2017-08-27 ENCOUNTER — Telehealth: Payer: Self-pay

## 2017-08-27 NOTE — Telephone Encounter (Signed)
Per Dr Alvy Bimler:   Can you call her niece for update how pateint is doing?   Called niece Carol Ferguson  as we have permission to talk with her, she said the following of patient's condition:  -Since going to hospice, patient is doing better, more awake, drinking and eating well for her -Carol Ferguson they reduced some of the sedatives, so thinks that has helped -Still unsteady on her feet -No signs of blockage as far as GI - had a bm when first got to hospice with no nausea or pain   Her questions are the following: -What does Dr Alvy Bimler recommend now, what are the next steps since she seems better? -Should we consider any treatment, further testing, or will she stay at hospice or have to move her since she seems better?  I told her I will route note back to Dr Alvy Bimler for her to address her questions. No other needs per niece at this time.

## 2017-08-28 NOTE — Telephone Encounter (Signed)
Called with below message. Verbalized understanding. Instructed to call for questions.

## 2017-08-28 NOTE — Telephone Encounter (Signed)
I do not have any suggestions In my opinion, with recent CT showed progression on chemo, her prognosis remained poor Please get her niece to call me if she is discharged from [facility]

## 2017-09-01 ENCOUNTER — Ambulatory Visit: Payer: Medicare Other

## 2017-09-01 ENCOUNTER — Ambulatory Visit: Payer: Medicare Other | Admitting: Hematology and Oncology

## 2017-09-01 ENCOUNTER — Other Ambulatory Visit: Payer: Medicare Other

## 2017-09-09 ENCOUNTER — Telehealth: Payer: Self-pay

## 2017-09-09 NOTE — Telephone Encounter (Signed)
Niece called and left message. Claudell has improved so much and no longer eligible for hospice. She is eating and drinking okay. She is walking by herself. She is asking what the the next step would be?

## 2017-09-10 NOTE — Telephone Encounter (Signed)
Called niece and given below message. Verbalized understanding.  Shanna asked if [name] had a cancellation.  They would be willing to fill that spot.

## 2017-09-10 NOTE — Telephone Encounter (Signed)
None so far but I will keep her in mind I need to spend more time with her so the appointment is not rushed

## 2017-09-10 NOTE — Telephone Encounter (Signed)
I will see her on 6/3 Scheduling msg sent

## 2017-09-11 ENCOUNTER — Telehealth: Payer: Self-pay | Admitting: Hematology and Oncology

## 2017-09-11 ENCOUNTER — Telehealth: Payer: Self-pay

## 2017-09-11 NOTE — Telephone Encounter (Signed)
Spoke with Rachel Moulds, pt's niece, to let her know Dr Alvy Bimler has an opening at 23 today.  Rachel Moulds states that to her knowledge Medicare will not pay for appt if pt is still in Hospice care.  Rachel Moulds states she is currently working with the Hospice social worker to have her aunt placed in a rehab facility and once that happens she would like for her aunt to be seen by Dr Alvy Bimler on 09/22/17, if that appt is still available.  This RN will send in basket msg to scheduling to have 6/3 appt scheduled.

## 2017-09-11 NOTE — Telephone Encounter (Signed)
Mailed patient calendar of upcoming June appointments.

## 2017-09-17 DIAGNOSIS — G620 Drug-induced polyneuropathy: Secondary | ICD-10-CM | POA: Diagnosis not present

## 2017-09-17 DIAGNOSIS — E119 Type 2 diabetes mellitus without complications: Secondary | ICD-10-CM | POA: Diagnosis not present

## 2017-09-17 DIAGNOSIS — E1159 Type 2 diabetes mellitus with other circulatory complications: Secondary | ICD-10-CM | POA: Diagnosis not present

## 2017-09-17 DIAGNOSIS — M6281 Muscle weakness (generalized): Secondary | ICD-10-CM | POA: Diagnosis not present

## 2017-09-17 DIAGNOSIS — F5102 Adjustment insomnia: Secondary | ICD-10-CM | POA: Diagnosis not present

## 2017-09-17 DIAGNOSIS — R1909 Other intra-abdominal and pelvic swelling, mass and lump: Secondary | ICD-10-CM | POA: Diagnosis not present

## 2017-09-17 DIAGNOSIS — E43 Unspecified severe protein-calorie malnutrition: Secondary | ICD-10-CM | POA: Diagnosis not present

## 2017-09-17 DIAGNOSIS — Z5111 Encounter for antineoplastic chemotherapy: Secondary | ICD-10-CM | POA: Diagnosis not present

## 2017-09-17 DIAGNOSIS — K56699 Other intestinal obstruction unspecified as to partial versus complete obstruction: Secondary | ICD-10-CM | POA: Diagnosis not present

## 2017-09-17 DIAGNOSIS — C562 Malignant neoplasm of left ovary: Secondary | ICD-10-CM | POA: Diagnosis not present

## 2017-09-17 DIAGNOSIS — C801 Malignant (primary) neoplasm, unspecified: Secondary | ICD-10-CM | POA: Diagnosis not present

## 2017-09-17 DIAGNOSIS — G893 Neoplasm related pain (acute) (chronic): Secondary | ICD-10-CM | POA: Diagnosis not present

## 2017-09-17 DIAGNOSIS — F411 Generalized anxiety disorder: Secondary | ICD-10-CM | POA: Diagnosis not present

## 2017-09-17 DIAGNOSIS — N329 Bladder disorder, unspecified: Secondary | ICD-10-CM | POA: Diagnosis not present

## 2017-09-17 DIAGNOSIS — R488 Other symbolic dysfunctions: Secondary | ICD-10-CM | POA: Diagnosis not present

## 2017-09-17 DIAGNOSIS — R188 Other ascites: Secondary | ICD-10-CM | POA: Diagnosis not present

## 2017-09-17 DIAGNOSIS — K56609 Unspecified intestinal obstruction, unspecified as to partial versus complete obstruction: Secondary | ICD-10-CM | POA: Diagnosis not present

## 2017-09-17 DIAGNOSIS — I2510 Atherosclerotic heart disease of native coronary artery without angina pectoris: Secondary | ICD-10-CM | POA: Diagnosis not present

## 2017-09-17 DIAGNOSIS — I429 Cardiomyopathy, unspecified: Secondary | ICD-10-CM | POA: Diagnosis not present

## 2017-09-17 DIAGNOSIS — C189 Malignant neoplasm of colon, unspecified: Secondary | ICD-10-CM | POA: Diagnosis not present

## 2017-09-17 DIAGNOSIS — C786 Secondary malignant neoplasm of retroperitoneum and peritoneum: Secondary | ICD-10-CM | POA: Diagnosis not present

## 2017-09-17 DIAGNOSIS — F329 Major depressive disorder, single episode, unspecified: Secondary | ICD-10-CM | POA: Diagnosis not present

## 2017-09-17 DIAGNOSIS — I25118 Atherosclerotic heart disease of native coronary artery with other forms of angina pectoris: Secondary | ICD-10-CM | POA: Diagnosis not present

## 2017-09-17 DIAGNOSIS — K5909 Other constipation: Secondary | ICD-10-CM | POA: Diagnosis not present

## 2017-09-17 DIAGNOSIS — E441 Mild protein-calorie malnutrition: Secondary | ICD-10-CM | POA: Diagnosis not present

## 2017-09-17 DIAGNOSIS — R2681 Unsteadiness on feet: Secondary | ICD-10-CM | POA: Diagnosis not present

## 2017-09-18 ENCOUNTER — Non-Acute Institutional Stay (SKILLED_NURSING_FACILITY): Payer: Medicare Other | Admitting: Internal Medicine

## 2017-09-18 ENCOUNTER — Encounter: Payer: Self-pay | Admitting: Internal Medicine

## 2017-09-18 DIAGNOSIS — C189 Malignant neoplasm of colon, unspecified: Secondary | ICD-10-CM | POA: Diagnosis not present

## 2017-09-18 DIAGNOSIS — K5909 Other constipation: Secondary | ICD-10-CM

## 2017-09-18 DIAGNOSIS — F411 Generalized anxiety disorder: Secondary | ICD-10-CM

## 2017-09-18 DIAGNOSIS — I25118 Atherosclerotic heart disease of native coronary artery with other forms of angina pectoris: Secondary | ICD-10-CM | POA: Diagnosis not present

## 2017-09-18 DIAGNOSIS — F329 Major depressive disorder, single episode, unspecified: Secondary | ICD-10-CM

## 2017-09-18 DIAGNOSIS — F5102 Adjustment insomnia: Secondary | ICD-10-CM | POA: Diagnosis not present

## 2017-09-18 DIAGNOSIS — F32A Depression, unspecified: Secondary | ICD-10-CM

## 2017-09-18 NOTE — Progress Notes (Signed)
: Provider:  Noah Delaine. Sheppard Coil, MD Location:  [facility] Room Number: 626-R Place of Service:  SNF ([id number])  PCP: [name], PA-C Patient Care Team: Donzetta Kohut as PCP - General (Physician Assistant) [name], MD as Consulting Physician (Obstetrics and Gynecology) Mauri Pole, MD as Consulting Physician (Gastroenterology) Belva Crome, MD as Consulting Physician (Cardiology)  Extended Emergency Contact Information Primary Emergency Contact: Reece,Shanna Address: [address]          [place], [place] [address] Johnnette Litter of [place] Phone: [phone] Relation: Niece Secondary Emergency Contact: Guadelupe Sabin Mobile Phone: [phone] Relation: Nephew     Allergies: Flagyl [metronidazole]  Chief Complaint  Patient presents with  . New Admit To SNF    Admit to Facility     HPI: Patient is 67 y.o. 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  Patient now plans on participating in OT/PT.  While at skilled nursing facility patient will be followed for coronary artery disease treated with aspirin, anxiety treated with Lorazepam and constipation treated with Senokot-S.  For the moment patient's cancer pain will continue to be treated with morphine.  Past Medical History:  Diagnosis Date  . Anxiety   . 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 CAD (coronary artery disease)    a. Inf STEMI/LHC 09/2016 which showed occluded mid LCx treated with PCI, DES; remaining cath details included a proximal to mid LAD lesion of 20% stenosis, and proximal to mid RCA 25% stenosis. EF 55-60%.  . Depression   . Diverticulosis   . Gallstones   . Ischemic cardiomyopathy    a.  2D Echo 09/28/16 showed EF 55-60%, probable severe hypokinesis of the entire inferiormyocardium, normal diastolic parameters.  . Myocardial infarction ([place]) 09/26/2016  . Pneumonia   . Presence of tooth-root and mandibular implants   . Tobacco abuse   . Wears glasses     Past Surgical History:  Procedure Laterality Date  . CERVICAL CONIZATION W/BX N/A 01/28/2017   Procedure: CONIZATION CERVIX WITH BIOPSY;  Surgeon: [name], MD;  Location: [facility];  Service: Gynecology;  Laterality: N/A;  . CHOLECYSTECTOMY    . CORONARY STENT INTERVENTION N/A 09/26/2016   Procedure: Coronary Stent Intervention;  Surgeon: Belva Crome, MD;  Location: [facility];  Service: Cardiovascular;  Laterality: N/A;  . DILATION AND CURETTAGE OF UTERUS N/A 01/28/2017   Procedure: DILATATION AND CURETTAGE WITH ULTRASOUND GUIDENCE;  Surgeon: [name], MD;  Location: [facility];  Service: Gynecology;  Laterality: N/A;  . LEFT HEART CATH AND CORONARY ANGIOGRAPHY N/A 09/26/2016   Procedure: Left Heart Cath and Coronary Angiography;  Surgeon: Belva Crome, MD;  Location: [facility];  Service: Cardiovascular;  Laterality: N/A;    Allergies as of 09/18/2017      Reactions   Flagyl [metronidazole] Nausea Only      Medication List        Accurate as of 09/18/17 12:08 PM. Always use your most recent med list.          diphenhydrAMINE 25 MG tablet Commonly known as:  BENADRYL Take 25 mg by mouth every 6 (six) hours as needed.  LORazepam 1 MG tablet Commonly known as:  ATIVAN Take 1 mg by mouth every 4 (four) hours.   morphine 20 MG/ML concentrated solution Commonly known as:  ROXANOL Take 5 mg by mouth every 6 (six) hours as needed for severe pain.   sennosides-docusate sodium 8.6-50 MG tablet Commonly known as:  SENOKOT-S Take 2-4 tablets by mouth 2 (two) times daily.       No orders of the defined types were placed in this encounter.   Immunization  History  Administered Date(s) Administered  . Pneumococcal Polysaccharide-23 09/28/2016    Social History   Tobacco Use  . Smoking status: Former Smoker    Packs/day: 1.00    Years: 35.00    Pack years: 35.00    Types: Cigarettes    Last attempt to quit: 09/26/2016    Years since quitting: 0.9  . Smokeless tobacco: Never Used  Substance Use Topics  . Alcohol use: Yes    Comment: occasional    Family history is   Family History  Problem Relation Age of Onset  . Cancer Brother        MELANOMA  . Heart disease Father   . Stroke Father   . Heart disease Brother   . Diabetes Brother   . Heart attack Brother   . Heart failure Brother       Review of Systems  DATA OBTAINED: from patient GENERAL:  no fevers, fatigue, appetite changes SKIN: No itching, or rash EYES: No eye pain, redness, discharge EARS: No earache, tinnitus, change in hearing NOSE: No congestion, drainage or bleeding  MOUTH/THROAT: No mouth or tooth pain, No sore throat RESPIRATORY: No cough, wheezing, SOB CARDIAC: No chest pain, palpitations, lower extremity edema  GI: No abdominal pain, No N/V/D or constipation, No heartburn or reflux  GU: No dysuria, frequency or urgency, or incontinence  MUSCULOSKELETAL: No unrelieved bone/joint pain NEUROLOGIC: No headache, dizziness or focal weakness PSYCHIATRIC: No c/o anxiety or sadness   Vitals:   09/18/17 1128  BP: (!) 98/52  Pulse: 69  Resp: 18  Temp: 97.6 F (36.4 C)  SpO2: 97%    SpO2 Readings from Last 1 Encounters:  09/18/17 97%   Body mass index is 20.3 kg/m.     Physical Exam  GENERAL APPEARANCE: Alert, conversant,  No acute distress, looking much much better than expected SKIN: No diaphoresis rash HEAD: Normocephalic, atraumatic  EYES: Conjunctiva/lids clear. Pupils round, reactive. EOMs intact.  EARS: External exam WNL, canals clear. Hearing grossly normal.  NOSE: No deformity or discharge.  MOUTH/THROAT: Lips w/o lesions    RESPIRATORY: Breathing is even, unlabored. Lung sounds are clear   CARDIOVASCULAR: Heart RRR no murmurs, rubs or gallops. No peripheral edema.   GASTROINTESTINAL: Abdomen is soft, non-tender, not distended w/ normal bowel sounds. GENITOURINARY: Bladder non tender, not distended  MUSCULOSKELETAL: No abnormal joints or musculature NEUROLOGIC:  Cranial nerves 2-12 grossly intact. Moves all extremities  PSYCHIATRIC: Mood and affect appropriate to situation, no behavioral issues  Patient Active Problem List   Diagnosis Date Noted  . Abdominal pain   . Terminal care   . Abdominal distention   . Cancer related pain   . Palliative care by specialist   . Protein-calorie malnutrition, severe 08/15/2017  . Other constipation 08/04/2017  . Peripheral neuropathy due to chemotherapy ([place]) 08/04/2017  . Mild protein-calorie malnutrition ([place]) 08/04/2017  . Anxiety state 07/16/2017  . Nausea without vomiting 07/16/2017  . Weight loss 07/16/2017  . Colon cancer ([place]) 07/15/2017  .  Goals of care, counseling/discussion 07/15/2017  . SBO (small bowel obstruction) ([place]) 07/12/2017  . Left tubo-ovarian mass 07/02/2017  . Peritoneal carcinomatosis ([place]) 07/02/2017  . Diverticulosis 01/10/2017  . Cervical mass 12/27/2016  . CAD (coronary artery disease), native coronary artery 11/27/2016  . Chronic diastolic heart failure ([place]) 09/27/2016  . Hyperlipidemia with target LDL less than 70 09/27/2016  . 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 Lumbar degenerative disc disease 06/14/2016      Labs reviewed: Basic Metabolic Panel:    Component Value Date/Time   NA 136 08/17/2017 0323   K 3.5 08/17/2017 0323   CL 103 08/17/2017 0323   CO2 25 08/17/2017 0323   GLUCOSE 111 (H) 08/17/2017 0323   BUN 8 08/17/2017 0323   CREATININE 0.67 08/17/2017 0323   CREATININE 0.80 08/13/2017 1343   CALCIUM 8.2 (L) 08/17/2017 0323   PROT 5.5 (L) 08/17/2017 0323   PROT 6.7 11/28/2016 0849    ALBUMIN 2.5 (L) 08/17/2017 0323   ALBUMIN 4.3 11/28/2016 0849   AST 21 08/17/2017 0323   AST 14 08/13/2017 1343   ALT 20 08/17/2017 0323   ALT 13 08/13/2017 1343   ALKPHOS 81 08/17/2017 0323   BILITOT 0.7 08/17/2017 0323   BILITOT 0.7 08/13/2017 1343   GFRNONAA >60 08/17/2017 0323   GFRNONAA >60 08/13/2017 1343   GFRAA >60 08/17/2017 0323   GFRAA >60 08/13/2017 1343    Recent Labs    08/15/17 0410 08/16/17 0344 08/17/17 0323  NA 137 137 136  K 3.7 3.2* 3.5  CL 103 103 103  CO2 25 23 25   GLUCOSE 95 101* 111*  BUN 15 11 8   CREATININE 0.63 0.61 0.67  CALCIUM 8.3* 8.0* 8.2*  MG 1.6* 1.8 1.6*  PHOS 3.1 3.1 3.0   Liver Function Tests: Recent Labs    08/15/17 0410 08/16/17 0344 08/17/17 0323  AST 30 21 21   ALT 28 22 20   ALKPHOS 94 90 81  BILITOT 0.7 0.9 0.7  PROT 5.4* 5.2* 5.5*  ALBUMIN 2.6* 2.5* 2.5*   Recent Labs    07/12/17 0436 07/27/17 1728 08/13/17 1728  LIPASE 25 36 39   No results for input(s): AMMONIA in the last 8760 hours. CBC: Recent Labs    08/15/17 0410 08/16/17 0344 08/17/17 0323  WBC 6.8 8.5 6.1  NEUTROABS 4.8 6.2 4.1  HGB 12.1 12.1 12.0  HCT 35.4* 35.1* 34.5*  MCV 94.7 94.4 93.8  PLT 181 147* 154   Lipid Recent Labs    09/27/16 0407 11/28/16 0849  CHOL 144 124  HDL 56 80  LDLCALC 68 26  TRIG 102 89    Cardiac Enzymes: Recent Labs    09/26/16 1253 09/26/16 1822  TROPONINI 2.35* 34.78*   BNP: No results for input(s): BNP in the last 8760 hours. No results found for: [facility] Lab Results  Component Value Date   HGBA1C 5.6 08/16/2017   Lab Results  Component Value Date   TSH 0.999 09/26/2016   No results found for: VITAMINB12 No results found for: FOLATE No results found for: IRON, TIBC, FERRITIN  Imaging and Procedures obtained prior to SNF admission: Dg Abd 1 View  Result Date: 08/14/2017 CLINICAL DATA:  Abdominal distention and pain EXAM: ABDOMEN - 1 VIEW COMPARISON:  CT abdomen and pelvis of 08/13/2016  FINDINGS: There is still gaseous distention of small bowel loops consistent with a persistent partial small bowel obstruction. Some contrast is noted in the right abdomen possibly within the  right colon. Surgical clips are noted in the right upper quadrant from prior cholecystectomy. IMPRESSION: Persistent partial small bowel obstruction. Electronically Signed   By: Ivar Drape M.D.   On: 08/14/2017 10:25   Ct Abdomen Pelvis W Contrast  Result Date: 08/13/2017 CLINICAL DATA:  67 year old female with history of colon cancer presents with nausea and vomiting as well as progressive abdominal pain. Concern for small bowel obstruction. EXAM: CT ABDOMEN AND PELVIS WITH CONTRAST TECHNIQUE: Multidetector CT imaging of the abdomen and pelvis was performed using the standard protocol following bolus administration of intravenous contrast. CONTRAST:  74mL ISOVUE-300 IOPAMIDOL (ISOVUE-300) INJECTION 61%, 147mL ISOVUE-300 IOPAMIDOL (ISOVUE-300) INJECTION 61% COMPARISON:  CT from 07/18/2017 and 07/27/2017 FINDINGS: Lower chest: Normal size heart without pericardial effusion. Pulmonary mass or pulmonary consolidation. No effusion. Hepatobiliary: Status post cholecystectomy. Mild intrahepatic ductal dilatation appears chronic and likely related to reservoir effect cholecystectomy. No choledocholithiasis. No focal liver lesion. Pancreas: No pancreatic mass or pathologic ductal dilatation. Spleen: Normal size spleen without mass. Adrenals/Urinary Tract: Stable mild nodular appearance of the left adrenal apex. Symmetric enhancement of both kidneys with water attenuating 13 mm cyst in the upper pole of the left kidney. No nephrolithiasis nor hydroureteronephrosis. Nondistended urinary bladder without focal mural thickening, mass or calculus. Stomach/Bowel: The stomach is distended with enteric contrast. No intraluminal mass or focal mural thickening. There are fluid-filled dilated small bowel loops out of proportion to nondistended  large bowel, caliber up to 2.7 cm and consistent with changes of high-grade small bowel obstruction. Suspect that the transition zone is within the pelvis given irregularly enhancing masslike opacities within the lower pelvis concerning for changes of possible peritoneal carcinomatosis and/or necrotic adenopathy. The colon is decompressed in appearance. Interval redevelopment of a small to moderate volume of ascites within the upper abdomen with redemonstration of omental edema and thickening. Vascular/Lymphatic: Nonaneurysmal atherosclerotic aorta and branch vessels. Reproductive: Mixed solid and cystic masslike abnormality in the left adnexa possibly of ovarian etiology similar in appearance to prior. Similar in appearance uterus and right adnexa. Other: Masslike abnormality in the right hemipelvis is redemonstrated, margins are ill-defined and difficult to delineate. This may be contributing to the patient's small-bowel obstruction either from adhesion or direct localized involvement of small bowel. Musculoskeletal: No aggressive osseous lesions. Degenerative changes along the dorsal spine. IMPRESSION: 1. The findings within the abdomen and pelvis are similar to that of 07/12/2017. There has been redevelopment of a small to moderate volume of ascites, small bowel dilatation compatible with small bowel obstruction with probable zone of transition in the right hemipelvis secondary to pelvic mass like abnormality that may reflect peritoneal carcinomatosis for localized pelvic necrotic adenopathy. 2. Redemonstration of omental disease with induration and thickening. Electronically Signed   By: Ashley Royalty M.D.   On: 08/13/2017 20:19     Not all labs, radiology exams or other studies done during hospitalization come through on my EPIC note; however they are reviewed by me.    Assessment and Plan  Metastatic colon cancer, status post small bowel obstruction- patient says her goal is to get stronger and to go  back to her her home to live for the next month or 2; she refuses to have any more chemotherapy, she says she might consider radiation therapy- patient will participate in OT/PT; continue bed until 10 mg every 8 as needed spasms and abdominal discomfort; will also continue morphine for pain  Coronary artery disease, status post MI 09/2016- patient is currently on ASA 81 mg daily  which will continue  Anxiety-we will continue PRN lorazepam 1 mg for 2 weeks until we decide what her schedule is and then we will schedule her medications  Depression-continue Prozac 20 mg daily  Insomnia-continue trazodone 50 mg daily  Constipation- continue Senokot-S 1 p.o. twice daily    Time spent greater than 35 minutes;> 50% of time with patient was spent reviewing records, labs, tests and studies, counseling and developing plan of care  Webb Silversmith D. Sheppard Coil, MD

## 2017-09-21 ENCOUNTER — Encounter: Payer: Self-pay | Admitting: Internal Medicine

## 2017-09-21 DIAGNOSIS — F329 Major depressive disorder, single episode, unspecified: Secondary | ICD-10-CM | POA: Insufficient documentation

## 2017-09-21 DIAGNOSIS — G4709 Other insomnia: Secondary | ICD-10-CM | POA: Insufficient documentation

## 2017-09-21 DIAGNOSIS — F32A Depression, unspecified: Secondary | ICD-10-CM | POA: Insufficient documentation

## 2017-09-22 ENCOUNTER — Inpatient Hospital Stay: Payer: Medicare Other

## 2017-09-22 ENCOUNTER — Inpatient Hospital Stay: Payer: Medicare Other | Attending: Gynecologic Oncology | Admitting: Hematology and Oncology

## 2017-09-22 ENCOUNTER — Telehealth: Payer: Self-pay | Admitting: Hematology and Oncology

## 2017-09-22 ENCOUNTER — Encounter: Payer: Self-pay | Admitting: Hematology and Oncology

## 2017-09-22 VITALS — BP 133/106 | HR 72 | Temp 98.5°F | Resp 18 | Ht 65.0 in | Wt 114.0 lb

## 2017-09-22 DIAGNOSIS — Z7189 Other specified counseling: Secondary | ICD-10-CM

## 2017-09-22 DIAGNOSIS — E441 Mild protein-calorie malnutrition: Secondary | ICD-10-CM | POA: Insufficient documentation

## 2017-09-22 DIAGNOSIS — K5909 Other constipation: Secondary | ICD-10-CM

## 2017-09-22 DIAGNOSIS — G620 Drug-induced polyneuropathy: Secondary | ICD-10-CM | POA: Insufficient documentation

## 2017-09-22 DIAGNOSIS — C562 Malignant neoplasm of left ovary: Secondary | ICD-10-CM | POA: Diagnosis not present

## 2017-09-22 DIAGNOSIS — T451X5A Adverse effect of antineoplastic and immunosuppressive drugs, initial encounter: Secondary | ICD-10-CM

## 2017-09-22 DIAGNOSIS — C786 Secondary malignant neoplasm of retroperitoneum and peritoneum: Secondary | ICD-10-CM

## 2017-09-22 DIAGNOSIS — C189 Malignant neoplasm of colon, unspecified: Secondary | ICD-10-CM | POA: Insufficient documentation

## 2017-09-22 DIAGNOSIS — G893 Neoplasm related pain (acute) (chronic): Secondary | ICD-10-CM | POA: Insufficient documentation

## 2017-09-22 DIAGNOSIS — Z5111 Encounter for antineoplastic chemotherapy: Secondary | ICD-10-CM | POA: Insufficient documentation

## 2017-09-22 DIAGNOSIS — C801 Malignant (primary) neoplasm, unspecified: Secondary | ICD-10-CM

## 2017-09-22 LAB — CEA (IN HOUSE-CHCC): CEA (CHCC-In House): 3.44 ng/mL (ref 0.00–5.00)

## 2017-09-22 LAB — CBC WITH DIFFERENTIAL (CANCER CENTER ONLY)
Basophils Absolute: 0 10*3/uL (ref 0.0–0.1)
Basophils Relative: 1 %
Eosinophils Absolute: 0.1 10*3/uL (ref 0.0–0.5)
Eosinophils Relative: 1 %
HCT: 36 % (ref 34.8–46.6)
HEMOGLOBIN: 12.1 g/dL (ref 11.6–15.9)
LYMPHS ABS: 1.7 10*3/uL (ref 0.9–3.3)
LYMPHS PCT: 33 %
MCH: 32.8 pg (ref 25.1–34.0)
MCHC: 33.4 g/dL (ref 31.5–36.0)
MCV: 98.1 fL (ref 79.5–101.0)
Monocytes Absolute: 0.4 10*3/uL (ref 0.1–0.9)
Monocytes Relative: 9 %
NEUTROS PCT: 56 %
Neutro Abs: 2.8 10*3/uL (ref 1.5–6.5)
Platelet Count: 155 10*3/uL (ref 145–400)
RBC: 3.67 MIL/uL — AB (ref 3.70–5.45)
RDW: 15.1 % — ABNORMAL HIGH (ref 11.2–14.5)
WBC: 5 10*3/uL (ref 3.9–10.3)

## 2017-09-22 LAB — CMP (CANCER CENTER ONLY)
ALT: 10 U/L (ref 0–55)
AST: 20 U/L (ref 5–34)
Albumin: 3.3 g/dL — ABNORMAL LOW (ref 3.5–5.0)
Alkaline Phosphatase: 92 U/L (ref 40–150)
Anion gap: 8 (ref 3–11)
BUN: 21 mg/dL (ref 7–26)
CHLORIDE: 105 mmol/L (ref 98–109)
CO2: 26 mmol/L (ref 22–29)
Calcium: 8.9 mg/dL (ref 8.4–10.4)
Creatinine: 0.78 mg/dL (ref 0.60–1.10)
Glucose, Bld: 91 mg/dL (ref 70–140)
Potassium: 3.8 mmol/L (ref 3.5–5.1)
Sodium: 139 mmol/L (ref 136–145)
Total Bilirubin: 0.4 mg/dL (ref 0.2–1.2)
Total Protein: 6.6 g/dL (ref 6.4–8.3)

## 2017-09-22 NOTE — Assessment & Plan Note (Signed)
She has chronic pain stable on morphine Her pain has reduced dramatically and that could mean she might have positive response to treatment As above, I will repeat imaging study For now, she will continue pain medicine as needed

## 2017-09-22 NOTE — Progress Notes (Signed)
[place] OFFICE PROGRESS NOTE  Patient Care Team: Donzetta Kohut as PCP - General (Physician Assistant) [name], MD as Consulting Physician (Obstetrics and Gynecology) Mauri Pole, MD as Consulting Physician (Gastroenterology) Belva Crome, MD as Consulting Physician (Cardiology)  ASSESSMENT & PLAN:  Colon cancer Carol Ferguson'S) She is recovering very well since recent hospitalization and has been discharged from hospice service The patient is interested to pursue further chemotherapy I recommend repeat imaging study with CT scan of the abdomen and pelvis and see her back next week to discuss test results Today, I will order blood work for further evaluation including tumor marker monitoring She agree with the plan of care  Other constipation She continues to have chronic constipation She will continue laxative therapy  Peripheral neuropathy due to chemotherapy [facility]) She denies peripheral neuropathy on exam today  Cancer related pain She has chronic pain stable on morphine Her pain has reduced dramatically and that could mean she might have positive response to treatment As above, I will repeat imaging study For now, she will continue pain medicine as needed  Goals of care, counseling/discussion We have numerous discussions about goals of care Her niece and nephew are both dedicated healthcare power of attorney The patient has changed her decision about treatment and would like to pursue further therapy   Orders Placed This Encounter  Procedures  . CT ABDOMEN PELVIS W CONTRAST    Standing Status:   Future    Standing Expiration Date:   09/23/2018    Order Specific Question:   If indicated for the ordered procedure, I authorize the administration of contrast media per Radiology protocol    Answer:   Yes    Order Specific Question:   Preferred imaging location?    Answer:   [facility]    Order Specific Question:   Radiology Contrast  Protocol - do NOT remove file path    Answer:   [email or web address]    INTERVAL HISTORY: Please see below for problem oriented charting. She returns with her nephew, Carol Ferguson for further evaluation She is currently in skilled nursing facility after discharge from hospice for rehab She is doing very well She has started to eat better and has gained weight She denies nausea or vomiting Left lower quadrant pain is minimum She continues to have chronic constipation, responded well to laxatives She felt stronger and is interested to pursue further therapy  SUMMARY OF ONCOLOGIC HISTORY: Oncology History   MSI stable     Colon cancer ([place])   12/17/2016 Imaging    US pelvis 1. There appears to be a 2.2 x 1.9 x 2.0 cm mass centered in the cervix, likely extending into the lower uterus. Recommend correlation with physical exam. An MRI could better evaluate. 2. Thickened endometrium measuring 12 mm. Recommend gynecologic consultation and endometrial sampling given patient age. 3. Probable fibroid in the uterus.      12/27/2016 Pathology Results    Endocervix, curettage - BENIGN ENDOCERVICAL MUCOSA AND BENIGN SQUAMOUS MUCOSA      01/01/2017 Imaging    MRI pelvis 1. No classic intermediate to high signal intensity tissue disrupting the hypointense fibrous cervical stroma as typically seen in the setting of cervical carcinoma. However, there is asymmetric thickening of the posterior cervix with anterior displacement of the cervical canal and associated widening and heterogeneity of the endometrial cavity. As such, imaging features are indeterminate by MRI and close follow-up or tissue sampling is recommended. 2. Apparent areas  of tethering/scarring between the sigmoid colon posterior uterus/cervix. Diverticuli are noted in the sigmoid colon and this may be related to scarring from prior bouts is diverticulitis. 3. Small volume intraperitoneal free fluid.      01/28/2017  Pathology Results    1. Cervix, biopsy, posterior - SPINDLE CELL LESION CONSISTENT WITH SMOOTH MUSCLE NEOPLASM, SEE COMMENT. 2. Endometrium, curettage - BENIGN SQUAMOUS MUCOSA. - BENIGN SMOOTH MUSCLE. - NO ENDOMETRIUM PRESENT. Microscopic Comment 1. There is a spindle cell lesion with focal atypia. There is no necrosis or increase in mitotic figures. The sample is somewhat limited, but the differential includes a cellular leiomyoma.      01/28/2017 Surgery    Surgery: ultrasound guided dilation of cervix with D&C and cervical biopsy  Surgeons:  Donaciano Eva, MD  Operative findings: 6cm uterus, dilated endometrial cavity, very stenotic cervical os. Thickened, fibrotic cervix without discrete mass, grossly normal cervical mucosa.         03/22/2017 Imaging    CT abdomen and pelvis 1. Diffuse prominent fluid-filled small bowel with mesenteric edema, enteritis pattern. This may be infectious or inflammatory. There is no evidence of obstruction. 2. Mild omental stranding in the left abdomen likely reactive secondary to primary bowel process, omental caking is not entirely excluded. 3. Sigmoid colonic diverticulosis without diverticulitis. 4. Prominent heterogeneous endometrium, recommend correlation with recent D and C. 5.  Aortic Atherosclerosis (ICD10-I70.0).      06/26/2017 Imaging    US pelvis T/V images. Anteverted and heterogenous uterus measuring 5.85 x 3.87 x 3.36 cm. Right subserous fibroid with calcifications measuring 2.7 x 2.6 cm. Endometrial line appears thickened at 8.1 mm. Cervix appears normal, no mass seen. Right ovary not seen. Left adnexal mass cystic/solid measuring 7.6 x 6.0 x 7.1 cm. Positive color flow Doppler.No free fluid in the posterior cul-de-sac.      06/26/2017 Tumor Marker    Patient's tumor was tested for the following markers: CA-125 Results of the tumor marker test revealed 90      06/26/2017 Tumor Marker    Patient's tumor was tested  for the following markers: CEA Results of the tumor marker test revealed 4.5      06/27/2017 Imaging    Ct abdomen and pelvis New 7.4 cm left adnexal mass with worsening abdominal ascites and omental nodularity, suggesting metastatic ovarian carcinoma.      07/02/2017 Pathology Results    Cervix, biopsy, ectocervix - SQUAMOUS MUCOSA WITH ATYPICAL INFILTRATE WITHIN FIBROMUSCULAR STROMA. - SEE MICROSCOPIC DESCRIPTION. Microscopic Comment There is a small fragment of squamous mucosa with a small portion of benign squamous epithelium. Within the fibromuscular stroma there are strands and nests of epithelioid cells, which have an infiltrating pattern and there are a few with cytoplasmic vacuoles. The findings are worrisome for an infiltrating neoplastic process and additional tissue may be indicated. There is insufficient tissue remaining in the current biopsy for additional studies.      07/11/2017 Procedure    CT-guided biopsy of the peritoneal thickening in the left lateral abdomen. Procedure was technically challenging due to the small amount of peritoneal thickening and close proximity of multiple bowel loops. False negative biopsy is possible.      07/11/2017 Pathology Results    Soft Tissue Needle Core Biopsy, omentum - ADENOCARCINOMA. - SEE COMMENT. Microscopic Comment The malignant cells are positive for CDX-2, cytokeratin 20, and p53. They are negative for cytokeratin 7, estrogen receptor, and PAX-8. This immunohistochemical profile argues against a gynecologic primary, and the differential  diagnosis includes gastrointestinal primary.      07/12/2017 Imaging    Ct abdomen and pelvis 1. New small bowel obstruction with transition point seen in the right lower quadrant, possibly secondary to adhesions related to peritoneal carcinomatosis. No pneumatosis. 2. Findings suggestive of metastatic ovarian cancer. Slightly worsened abdominal ascites may be related to metastatic disease or  reactive to the small-bowel obstruction. 3. Expected post biopsy changes in the left lower quadrant adjacent to peritoneal nodularity. No intra-abdominal hemorrhage.      07/12/2017 - 07/14/2017 Hospital Admission    She was admitted for management of subacute bowel obstruction      07/15/2017 Cancer Staging    Staging form: Colon and Rectum, AJCC 8th Edition - Clinical: Stage Unknown (cTX, cNX, pM1) - Signed by Heath Lark, MD on 07/15/2017      07/18/2017 Procedure    Successful placement of a right arm PICC with sonographic and fluoroscopic guidance. The catheter is ready for use.      08/04/2017 Imaging    The previously noted ascites in the abdomen and pelvis has resolved. 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  No small bowel or colonic obstruction is identified.  Generalized thickened bowel wall of the sigmoid colon.  Mixed cystic and solid mass of the left ovary is unchanged.  Status post prior cholecystectomy with mild postsurgical intrahepatic biliary ductal dilatation.        REVIEW OF SYSTEMS:   Constitutional: Denies fevers, chills  Eyes: Denies blurriness of vision Ears, nose, mouth, throat, and face: Denies mucositis or sore throat Respiratory: Denies cough, dyspnea or wheezes Cardiovascular: Denies palpitation, chest discomfort or lower extremity swelling Skin: Denies abnormal skin rashes Lymphatics: Denies new lymphadenopathy or easy bruising Neurological:Denies numbness, tingling or new weaknesses Behavioral/Psych: Mood is stable, no new changes  All other systems were reviewed with the patient and are negative.  I have reviewed the past medical history, past surgical history, social history and family history with the patient and they are unchanged from previous note.  ALLERGIES:  is allergic to flagyl [metronidazole].  MEDICATIONS:  Current Outpatient Medications  Medication Sig Dispense Refill  . diphenhydrAMINE  (BENADRYL) 25 MG tablet Take 25 mg by mouth every 6 (six) hours as needed.    Marland Kitchen LORazepam (ATIVAN) 1 MG tablet Take 1 mg by mouth every 4 (four) hours.    Marland Kitchen morphine (ROXANOL) 20 MG/ML concentrated solution Take 5 mg by mouth every 6 (six) hours as needed for severe pain.    Marland Kitchen sennosides-docusate sodium (SENOKOT-S) 8.6-50 MG tablet Take 2-4 tablets by mouth 2 (two) times daily.     No current facility-administered medications for this visit.    Facility-Administered Medications Ordered in Other Visits  Medication Dose Route Frequency Provider Last Rate Last Dose  . heparin lock flush 100 unit/mL  500 Units Intracatheter Once PRN Alvy Bimler, Chitara Clonch, MD      . sodium chloride flush (NS) 0.9 % injection 10 mL  10 mL Intracatheter PRN Alvy Bimler, Lilliane Sposito, MD        PHYSICAL EXAMINATION: ECOG PERFORMANCE STATUS: 1 - Symptomatic but completely ambulatory  Vitals:   09/22/17 1328  BP: (!) 133/106  Pulse: 72  Resp: 18  Temp: 98.5 F (36.9 C)  SpO2: 100%   Filed Weights   09/22/17 1328  Weight: 114 lb (51.7 kg)    GENERAL:alert, no distress and comfortable SKIN: skin color, texture, turgor are normal, no rashes or significant lesions EYES: normal, Conjunctiva are pink  and non-injected, sclera clear OROPHARYNX:no exudate, no erythema and lips, buccal mucosa, and tongue normal  NECK: supple, thyroid normal size, non-tender, without nodularity LYMPH:  no palpable lymphadenopathy in the cervical, axillary or inguinal LUNGS: clear to auscultation and percussion with normal breathing effort HEART: regular rate & rhythm and no murmurs and no lower extremity edema ABDOMEN:abdomen soft, non-tender and normal bowel sounds Musculoskeletal:no cyanosis of digits and no clubbing  NEURO: alert & oriented x 3 with fluent speech, no focal motor/sensory deficits  LABORATORY DATA:  I have reviewed the data as listed    Component Value Date/Time   NA 139 09/22/2017 1402   K 3.8 09/22/2017 1402   CL 105  09/22/2017 1402   CO2 26 09/22/2017 1402   GLUCOSE 91 09/22/2017 1402   BUN 21 09/22/2017 1402   CREATININE 0.78 09/22/2017 1402   CALCIUM 8.9 09/22/2017 1402   PROT 6.6 09/22/2017 1402   PROT 6.7 11/28/2016 0849   ALBUMIN 3.3 (L) 09/22/2017 1402   ALBUMIN 4.3 11/28/2016 0849   AST 20 09/22/2017 1402   ALT 10 09/22/2017 1402   ALKPHOS 92 09/22/2017 1402   BILITOT 0.4 09/22/2017 1402   GFRNONAA >60 09/22/2017 1402   GFRAA >60 09/22/2017 1402    No results found for: SPEP, UPEP  Lab Results  Component Value Date   WBC 5.0 09/22/2017   NEUTROABS 2.8 09/22/2017   HGB 12.1 09/22/2017   HCT 36.0 09/22/2017   MCV 98.1 09/22/2017   PLT 155 09/22/2017      Chemistry      Component Value Date/Time   NA 139 09/22/2017 1402   K 3.8 09/22/2017 1402   CL 105 09/22/2017 1402   CO2 26 09/22/2017 1402   BUN 21 09/22/2017 1402   CREATININE 0.78 09/22/2017 1402      Component Value Date/Time   CALCIUM 8.9 09/22/2017 1402   ALKPHOS 92 09/22/2017 1402   AST 20 09/22/2017 1402   ALT 10 09/22/2017 1402   BILITOT 0.4 09/22/2017 1402       All questions were answered. The patient knows to call the clinic with any problems, questions or concerns. No barriers to learning was detected.  I spent 20 minutes counseling the patient face to face. The total time spent in the appointment was 25 minutes and more than 50% was on counseling and review of test results  Heath Lark, MD 09/22/2017 3:09 PM

## 2017-09-22 NOTE — Assessment & Plan Note (Signed)
She denies peripheral neuropathy on exam today

## 2017-09-22 NOTE — Assessment & Plan Note (Signed)
We have numerous discussions about goals of care Her niece and nephew are both dedicated healthcare power of attorney The patient has changed her decision about treatment and would like to pursue further therapy

## 2017-09-22 NOTE — Telephone Encounter (Signed)
Gave patient AVs and calendar of upcoming appointments.  °

## 2017-09-22 NOTE — Assessment & Plan Note (Signed)
She continues to have chronic constipation She will continue laxative therapy

## 2017-09-22 NOTE — Assessment & Plan Note (Addendum)
She is recovering very well since recent hospitalization and has been discharged from hospice service The patient is interested to pursue further chemotherapy I recommend repeat imaging study with CT scan of the abdomen and pelvis and see her back next week to discuss test results Today, I will order blood work for further evaluation including tumor marker monitoring She agree with the plan of care

## 2017-09-26 ENCOUNTER — Encounter: Payer: Self-pay | Admitting: Internal Medicine

## 2017-09-26 ENCOUNTER — Non-Acute Institutional Stay (SKILLED_NURSING_FACILITY): Payer: Medicare Other | Admitting: Internal Medicine

## 2017-09-26 ENCOUNTER — Telehealth: Payer: Self-pay

## 2017-09-26 DIAGNOSIS — I25118 Atherosclerotic heart disease of native coronary artery with other forms of angina pectoris: Secondary | ICD-10-CM | POA: Diagnosis not present

## 2017-09-26 DIAGNOSIS — C189 Malignant neoplasm of colon, unspecified: Secondary | ICD-10-CM

## 2017-09-26 DIAGNOSIS — F411 Generalized anxiety disorder: Secondary | ICD-10-CM | POA: Diagnosis not present

## 2017-09-26 DIAGNOSIS — F5102 Adjustment insomnia: Secondary | ICD-10-CM

## 2017-09-26 DIAGNOSIS — F329 Major depressive disorder, single episode, unspecified: Secondary | ICD-10-CM

## 2017-09-26 DIAGNOSIS — F32A Depression, unspecified: Secondary | ICD-10-CM

## 2017-09-26 LAB — BASIC METABOLIC PANEL
BUN: 20 (ref 4–21)
Creatinine: 0.6 (ref 0.5–1.1)
GLUCOSE: 92
Potassium: 3.5 (ref 3.4–5.3)
SODIUM: 143 (ref 137–147)

## 2017-09-26 LAB — CBC AND DIFFERENTIAL
HEMATOCRIT: 33 — AB (ref 36–46)
HEMOGLOBIN: 10.9 — AB (ref 12.0–16.0)
Platelets: 136 — AB (ref 150–399)
WBC: 4

## 2017-09-26 NOTE — Telephone Encounter (Signed)
She called and left a message to call her.  Called back. She is concerned about drinking contrast for CT scan ordered for 6/10 that it may cause a bowel obstruction. Per [name], contrast does not cause a bowel obstruction. If she can drink water, she can take the contrast. Instructed to continue what ever she takes for her bowels. Verbalized understanding.

## 2017-09-26 NOTE — Progress Notes (Signed)
Location:  [place] of Service:  SNF (31)Skilled nursing facility Provider: Hennie Duos MD  PCP: [name], PA-C Patient Care Team: Donzetta Kohut as PCP - General (Physician Assistant) [name], MD as Consulting Physician (Obstetrics and Gynecology) Mauri Pole, MD as Consulting Physician (Gastroenterology) Belva Crome, MD as Consulting Physician (Cardiology)  Extended Emergency Contact Information Primary Emergency Contact: Guadelupe Sabin Mobile Phone: [phone] Relation: Nephew Secondary Emergency Contact: Reece,Shanna Address: [address]          [place], [place] [address] Johnnette Litter of [place] Phone: [phone] Relation: Niece  Allergies  Allergen Reactions  . Flagyl [Metronidazole] Nausea Only    Chief Complaint  Patient presents with  . Discharge Note    HPI:  67 y.o. 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  Participated in OT/PT and is now ready to be discharged home.    Past Medical History:  Diagnosis Date  . Anxiety   . 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 CAD (coronary artery disease)    a. Inf STEMI/LHC 09/2016 which showed occluded mid LCx treated with PCI, DES; remaining cath details included a proximal to mid LAD lesion of 20% stenosis, and proximal to mid RCA 25% stenosis. EF 55-60%.  . Depression   . Diverticulosis   . Gallstones   . Ischemic cardiomyopathy    a. 2D Echo 09/28/16 showed EF 55-60%, probable severe hypokinesis of the entire inferiormyocardium, normal diastolic parameters.  . Myocardial infarction ([place]) 09/26/2016  . Pneumonia   . Presence of tooth-root and mandibular implants   . Tobacco  abuse   . Wears glasses     Past Surgical History:  Procedure Laterality Date  . CERVICAL CONIZATION W/BX N/A 01/28/2017   Procedure: CONIZATION CERVIX WITH BIOPSY;  Surgeon: [name], MD;  [facility]: [facility];  Service: Gynecology;  Laterality: N/A;  . CHOLECYSTECTOMY    . CORONARY STENT INTERVENTION N/A 09/26/2016   Procedure: Coronary Stent Intervention;  Surgeon: Belva Crome, MD;  Location: [facility];  Service: Cardiovascular;  Laterality: N/A;  . DILATION AND CURETTAGE OF UTERUS N/A 01/28/2017   Procedure: DILATATION AND CURETTAGE WITH ULTRASOUND GUIDENCE;  Surgeon: [name], MD;  Location: [facility];  Service: Gynecology;  Laterality: N/A;  . LEFT HEART CATH AND CORONARY ANGIOGRAPHY N/A 09/26/2016   Procedure: Left Heart Cath and Coronary Angiography;  Surgeon: Belva Crome, MD;  Location: [facility];  Service: Cardiovascular;  Laterality: N/A;     reports that she quit smoking about a year ago. Her smoking use included cigarettes. She has a 35.00 pack-year smoking history. She has never used smokeless tobacco. She reports that she drinks alcohol. She reports that she does not use drugs. Social History   Socioeconomic History  . Marital status: Divorced    Spouse name: Not on file  . Number of children: 0  . Years of education: Not on file  . Highest education level: Not on file  Occupational History  . Occupation: retired  Scientific laboratory technician  . Financial resource strain: Not on file  . Food insecurity:    Worry: Not on file  Inability: Not on file  . Transportation needs:    Medical: Not on file    Non-medical: Not on file  Tobacco Use  . Smoking status: Former Smoker    Packs/day: 1.00    Years: 35.00    Pack years: 35.00    Types: Cigarettes    Last attempt to quit: 09/26/2016    Years since quitting: 1.0  . Smokeless tobacco: Never Used  Substance and Sexual Activity  . Alcohol use: Yes    Comment: occasional    . Drug use: No  . Sexual activity: Never  Lifestyle  . Physical activity:    Days per week: Not on file    Minutes per session: Not on file  . Stress: Not on file  Relationships  . Social connections:    Talks on phone: Not on file    Gets together: Not on file    Attends religious service: Not on file    Active member of club or organization: Not on file    Attends meetings of clubs or organizations: Not on file    Relationship status: Not on file  . Intimate partner violence:    Fear of current or ex partner: Not on file    Emotionally abused: Not on file    Physically abused: Not on file    Forced sexual activity: Not on file  Other Topics Concern  . Not on file  Social History Narrative  . Not on file    Pertinent  Health Maintenance Due  Topic Date Due  . COLONOSCOPY  07/04/1998  . MAMMOGRAM  10/22/2012  . DEXA SCAN  07/03/2013  . PNA vac Low Risk Adult (2 of 2 - PCV13) 09/28/2017  . INFLUENZA VACCINE  11/20/2017    Medications: Allergies as of 09/26/2017      Reactions   Flagyl [metronidazole] Nausea Only      Medication List        Accurate as of 09/26/17 11:59 PM. Always use your most recent med list.          aspirin EC 81 MG tablet Take 81 mg by mouth daily.   dicyclomine 20 MG tablet Commonly known as:  BENTYL Take 20 mg by mouth 4 (four) times daily -  before meals and at bedtime.   diphenhydrAMINE 25 MG tablet Commonly known as:  BENADRYL Take 25 mg by mouth every 6 (six) hours as needed.   FLUoxetine 20 MG tablet Commonly known as:  PROZAC Take 20 mg by mouth daily.   LORazepam 1 MG tablet Commonly known as:  ATIVAN Take 1 mg by mouth every 6 (six) hours as needed.   morphine 15 MG tablet Commonly known as:  MSIR Take 15 mg by mouth every 8 (eight) hours.   ondansetron 4 MG tablet Commonly known as:  ZOFRAN Take 4 mg by mouth every 8 (eight) hours as needed for nausea or vomiting.   sennosides-docusate sodium 8.6-50 MG  tablet Commonly known as:  SENOKOT-S Take 2-4 tablets by mouth 2 (two) times daily.   traZODone 50 MG tablet Commonly known as:  DESYREL Take 12.5 mg by mouth at bedtime.        Vitals:   09/28/17 2005  BP: 130/78  Pulse: 74  Resp: 18  Temp: 97.7 F (36.5 C)  Weight: 116 lb 9.6 oz (52.9 kg)   Body mass index is 19.4 kg/m.  Physical Exam  GENERAL APPEARANCE: Alert, conversant. No acute distress.  HEENT: Unremarkable. RESPIRATORY:  Breathing is even, unlabored. Lung sounds are clear   CARDIOVASCULAR: Heart RRR no murmurs, rubs or gallops. No peripheral edema.  GASTROINTESTINAL: Abdomen is soft, non-tender, not distended w/ normal bowel sounds.  NEUROLOGIC: Cranial nerves 2-12 grossly intact. Moves all extremities   Labs reviewed: Basic Metabolic Panel: Recent Labs    08/15/17 0410 08/16/17 0344 08/17/17 0323 09/22/17 1402 09/26/17  NA 137 137 136 139 143  K 3.7 3.2* 3.5 3.8 3.5  CL 103 103 103 105  --   CO2 25 23 25 26   --   GLUCOSE 95 101* 111* 91  --   BUN 15 11 8 21 20   CREATININE 0.63 0.61 0.67 0.78 0.6  CALCIUM 8.3* 8.0* 8.2* 8.9  --   MG 1.6* 1.8 1.6*  --   --   PHOS 3.1 3.1 3.0  --   --    No results found for: [facility] Liver Function Tests: Recent Labs    08/16/17 0344 08/17/17 0323 09/22/17 1402  AST 21 21 20   ALT 22 20 10   ALKPHOS 90 81 92  BILITOT 0.9 0.7 0.4  PROT 5.2* 5.5* 6.6  ALBUMIN 2.5* 2.5* 3.3*   Recent Labs    07/12/17 0436 07/27/17 1728 08/13/17 1728  LIPASE 25 36 39   No results for input(s): AMMONIA in the last 8760 hours. CBC: Recent Labs    08/16/17 0344 08/17/17 0323 09/22/17 1402 09/26/17  WBC 8.5 6.1 5.0 4.0  NEUTROABS 6.2 4.1 2.8  --   HGB 12.1 12.0 12.1 10.9*  HCT 35.1* 34.5* 36.0 33*  MCV 94.4 93.8 98.1  --   PLT 147* 154 155 136*   Lipid Recent Labs    11/28/16 0849  CHOL 124  HDL 80  LDLCALC 26  TRIG 89   Cardiac Enzymes: No results for input(s): CKTOTAL, CKMB, CKMBINDEX, TROPONINI in the  last 8760 hours. BNP: No results for input(s): BNP in the last 8760 hours. CBG: Recent Labs    08/17/17 1558 08/18/17 0004 08/18/17 0815  GLUCAP 123* 121* 120*    Procedures and Imaging Studies During Stay: No results found.  Assessment/Plan:   Malignant neoplasm of colon, unspecified part of colon ([place])  Coronary artery disease of native artery of native heart with stable angina pectoris (HCC)  Anxiety state  Depression, unspecified depression type  Adjustment insomnia   Patient is being discharged with the following home health services:  PT/Nursing  Patient is being discharged with the following durable medical equipment: none   Patient has been advised to f/u with their PCP in 1-2 weeks to bring them up to date on their rehab stay.  Social services at facility was responsible for arranging this appointment.  Pt was provided with a 30 day supply of prescriptions for medications and refills must be obtained from their PCP.  For controlled substances, a more limited supply may be provided adequate until PCP appointment only.  Medications have been reconciled.  Time spent greater than 30 minutes;> 50% of time with patient was spent reviewing records, labs, tests and studies, counseling and developing plan of care  Inocencio Homes, MD

## 2017-09-28 ENCOUNTER — Encounter: Payer: Self-pay | Admitting: Internal Medicine

## 2017-09-29 ENCOUNTER — Ambulatory Visit (HOSPITAL_COMMUNITY)
Admission: RE | Admit: 2017-09-29 | Discharge: 2017-09-29 | Disposition: A | Discharge status: Home / Self Care | Payer: Medicare Other | Source: Ambulatory Visit | Attending: Hematology and Oncology | Admitting: Hematology and Oncology

## 2017-09-29 DIAGNOSIS — N329 Bladder disorder, unspecified: Secondary | ICD-10-CM | POA: Insufficient documentation

## 2017-09-29 DIAGNOSIS — R1909 Other intra-abdominal and pelvic swelling, mass and lump: Secondary | ICD-10-CM | POA: Diagnosis not present

## 2017-09-29 DIAGNOSIS — R188 Other ascites: Secondary | ICD-10-CM | POA: Diagnosis not present

## 2017-09-29 DIAGNOSIS — C786 Secondary malignant neoplasm of retroperitoneum and peritoneum: Secondary | ICD-10-CM | POA: Insufficient documentation

## 2017-09-29 DIAGNOSIS — C801 Malignant (primary) neoplasm, unspecified: Secondary | ICD-10-CM | POA: Insufficient documentation

## 2017-09-29 DIAGNOSIS — C189 Malignant neoplasm of colon, unspecified: Secondary | ICD-10-CM | POA: Insufficient documentation

## 2017-09-29 MED ORDER — HEPARIN SOD (PORK) LOCK FLUSH 100 UNIT/ML IV SOLN: 500.0000 [IU] | Freq: Once | INTRAVENOUS | Status: DC

## 2017-09-29 MED ORDER — IOPAMIDOL (ISOVUE-300) INJECTION 61%
INTRAVENOUS | Status: AC
  Filled 2017-09-29: qty 100

## 2017-09-29 MED ORDER — IOPAMIDOL (ISOVUE-300) INJECTION 61%
100.0000 mL | Freq: Once | INTRAVENOUS | Status: AC | PRN
  Administered 2017-09-29: 80 mL via INTRAVENOUS

## 2017-09-29 MED ORDER — HEPARIN SOD (PORK) LOCK FLUSH 100 UNIT/ML IV SOLN
INTRAVENOUS | Status: AC
  Administered 2017-09-29: 500 [IU]
  Filled 2017-09-29: qty 5

## 2017-09-30 ENCOUNTER — Encounter: Payer: Self-pay | Admitting: Hematology and Oncology

## 2017-09-30 ENCOUNTER — Inpatient Hospital Stay (HOSPITAL_BASED_OUTPATIENT_CLINIC_OR_DEPARTMENT_OTHER): Payer: Medicare Other | Admitting: Hematology and Oncology

## 2017-09-30 ENCOUNTER — Other Ambulatory Visit: Payer: Self-pay | Admitting: Hematology and Oncology

## 2017-09-30 DIAGNOSIS — E441 Mild protein-calorie malnutrition: Secondary | ICD-10-CM

## 2017-09-30 DIAGNOSIS — G620 Drug-induced polyneuropathy: Secondary | ICD-10-CM | POA: Diagnosis not present

## 2017-09-30 DIAGNOSIS — K5909 Other constipation: Secondary | ICD-10-CM | POA: Diagnosis not present

## 2017-09-30 DIAGNOSIS — C786 Secondary malignant neoplasm of retroperitoneum and peritoneum: Secondary | ICD-10-CM | POA: Diagnosis not present

## 2017-09-30 DIAGNOSIS — Z5111 Encounter for antineoplastic chemotherapy: Secondary | ICD-10-CM | POA: Diagnosis not present

## 2017-09-30 DIAGNOSIS — C562 Malignant neoplasm of left ovary: Secondary | ICD-10-CM

## 2017-09-30 DIAGNOSIS — C801 Malignant (primary) neoplasm, unspecified: Secondary | ICD-10-CM

## 2017-09-30 DIAGNOSIS — C189 Malignant neoplasm of colon, unspecified: Secondary | ICD-10-CM | POA: Diagnosis not present

## 2017-09-30 NOTE — Progress Notes (Signed)
ALERT: A disease instance has been permanently removed from this patient's pathway record and replaced with a new disease instance. Information on the new disease instance will be transmitted in a separate message.  Disease Being Removed: Colorectal  Reason for Removal: Previous diagnosis has morphed into a different diagnosis

## 2017-09-30 NOTE — Assessment & Plan Note (Signed)
Carcinomatosis has improved and she has no signs or symptoms of bowel obstruction For now, she will continue to monitor her bowel habits and to take laxatives as needed

## 2017-09-30 NOTE — Assessment & Plan Note (Signed)
Overall, her nutrition status has improved She is gaining weight She will continue the same

## 2017-09-30 NOTE — Progress Notes (Signed)
[facility] OFFICE PROGRESS NOTE  Patient Care Team: Donzetta Kohut as PCP - General (Physician Assistant) [name], MD as Consulting Physician (Obstetrics and Gynecology) Mauri Pole, MD as Consulting Physician (Gastroenterology) Belva Crome, MD as Consulting Physician (Cardiology)  ASSESSMENT & PLAN:  Disseminated ovarian cancer, left [facility]) I have reviewed her case in great detail with GYN oncologist I reviewed multiple imaging studies with the patient and her niece Overall, she has positive response to treatment with resolution of ascites even though the ovarian mass looks a little bigger I am concerned about misdiagnosis of metastatic colon cancer It is also possible that her bowel obstruction was due to carcinomatosis from ovarian cancer Treatment choices would be drastically different She is off antiplatelet agents right now I recommend colonoscopy to look for intrinsic colon mass If colonoscopy is negative, I will treat her with ovarian cancer regimen I have discussed this with her gastroenterologist who will try to work her in soon In the meantime, I will consult advanced home care to resume weekly home care visit and PICC line care at home I will bring her back in as soon as I have results of her colonoscopy for further discussion We briefly discussed the current national guidelines for treatment of ovarian cancer and prognosis  Peritoneal carcinomatosis ([place]) Carcinomatosis has improved and she has no signs or symptoms of bowel obstruction For now, she will continue to monitor her bowel habits and to take laxatives as needed  Mild protein-calorie malnutrition ([place]) Overall, her nutrition status has improved She is gaining weight She will continue the same   No orders of the defined types were placed in this encounter.   INTERVAL HISTORY: Please see below for problem oriented charting. She returns with her knees to review test  results Since the last time I saw her, she has gained more weight in a healthy fashion She denies abdominal distention She has spontaneous regular normal bowel movement without laxatives She denies nausea She denies abnormal vaginal bleeding, bloating or abdominal pain  SUMMARY OF ONCOLOGIC HISTORY: Oncology History   MSI stable     Disseminated ovarian cancer, left ([place])   12/17/2016 Imaging    US pelvis 1. There appears to be a 2.2 x 1.9 x 2.0 cm mass centered in the cervix, likely extending into the lower uterus. Recommend correlation with physical exam. An MRI could better evaluate. 2. Thickened endometrium measuring 12 mm. Recommend gynecologic consultation and endometrial sampling given patient age. 3. Probable fibroid in the uterus.      12/27/2016 Pathology Results    Endocervix, curettage - BENIGN ENDOCERVICAL MUCOSA AND BENIGN SQUAMOUS MUCOSA      01/01/2017 Imaging    MRI pelvis 1. No classic intermediate to high signal intensity tissue disrupting the hypointense fibrous cervical stroma as typically seen in the setting of cervical carcinoma. However, there is asymmetric thickening of the posterior cervix with anterior displacement of the cervical canal and associated widening and heterogeneity of the endometrial cavity. As such, imaging features are indeterminate by MRI and close follow-up or tissue sampling is recommended. 2. Apparent areas of tethering/scarring between the sigmoid colon posterior uterus/cervix. Diverticuli are noted in the sigmoid colon and this may be related to scarring from prior bouts is diverticulitis. 3. Small volume intraperitoneal free fluid.      01/28/2017 Pathology Results    1. Cervix, biopsy, posterior - SPINDLE CELL LESION CONSISTENT WITH SMOOTH MUSCLE NEOPLASM, SEE COMMENT. 2. Endometrium, curettage - BENIGN  SQUAMOUS MUCOSA. - BENIGN SMOOTH MUSCLE. - NO ENDOMETRIUM PRESENT. Microscopic Comment 1. There is a spindle cell lesion with focal  atypia. There is no necrosis or increase in mitotic figures. The sample is somewhat limited, but the differential includes a cellular leiomyoma.      01/28/2017 Surgery    Surgery: ultrasound guided dilation of cervix with D&C and cervical biopsy  Surgeons:  Donaciano Eva, MD  Operative findings: 6cm uterus, dilated endometrial cavity, very stenotic cervical os. Thickened, fibrotic cervix without discrete mass, grossly normal cervical mucosa.         03/22/2017 Imaging    CT abdomen and pelvis 1. Diffuse prominent fluid-filled small bowel with mesenteric edema, enteritis pattern. This may be infectious or inflammatory. There is no evidence of obstruction. 2. Mild omental stranding in the left abdomen likely reactive secondary to primary bowel process, omental caking is not entirely excluded. 3. Sigmoid colonic diverticulosis without diverticulitis. 4. Prominent heterogeneous endometrium, recommend correlation with recent D and C. 5.  Aortic Atherosclerosis (ICD10-I70.0).      06/26/2017 Imaging    US pelvis T/V images. Anteverted and heterogenous uterus measuring 5.85 x 3.87 x 3.36 cm. Right subserous fibroid with calcifications measuring 2.7 x 2.6 cm. Endometrial line appears thickened at 8.1 mm. Cervix appears normal, no mass seen. Right ovary not seen. Left adnexal mass cystic/solid measuring 7.6 x 6.0 x 7.1 cm. Positive color flow Doppler.No free fluid in the posterior cul-de-sac.      06/26/2017 Tumor Marker    Patient's tumor was tested for the following markers: CA-125 Results of the tumor marker test revealed 90      06/26/2017 Tumor Marker    Patient's tumor was tested for the following markers: CEA Results of the tumor marker test revealed 4.5      06/27/2017 Imaging    Ct abdomen and pelvis New 7.4 cm left adnexal mass with worsening abdominal ascites and omental nodularity, suggesting metastatic ovarian carcinoma.      07/02/2017 Pathology Results     Cervix, biopsy, ectocervix - SQUAMOUS MUCOSA WITH ATYPICAL INFILTRATE WITHIN FIBROMUSCULAR STROMA. - SEE MICROSCOPIC DESCRIPTION. Microscopic Comment There is a small fragment of squamous mucosa with a small portion of benign squamous epithelium. Within the fibromuscular stroma there are strands and nests of epithelioid cells, which have an infiltrating pattern and there are a few with cytoplasmic vacuoles. The findings are worrisome for an infiltrating neoplastic process and additional tissue may be indicated. There is insufficient tissue remaining in the current biopsy for additional studies.      07/11/2017 Procedure    CT-guided biopsy of the peritoneal thickening in the left lateral abdomen. Procedure was technically challenging due to the small amount of peritoneal thickening and close proximity of multiple bowel loops. False negative biopsy is possible.      07/11/2017 Pathology Results    Soft Tissue Needle Core Biopsy, omentum - ADENOCARCINOMA. - SEE COMMENT. Microscopic Comment The malignant cells are positive for CDX-2, cytokeratin 20, and p53. They are negative for cytokeratin 7, estrogen receptor, and PAX-8. This immunohistochemical profile argues against a gynecologic primary, and the differential diagnosis includes gastrointestinal primary.      07/12/2017 Imaging    Ct abdomen and pelvis 1. New small bowel obstruction with transition point seen in the right lower quadrant, possibly secondary to adhesions related to peritoneal carcinomatosis. No pneumatosis. 2. Findings suggestive of metastatic ovarian cancer. Slightly worsened abdominal ascites may be related to metastatic disease or reactive to the small-bowel  obstruction. 3. Expected post biopsy changes in the left lower quadrant adjacent to peritoneal nodularity. No intra-abdominal hemorrhage.      07/12/2017 - 07/14/2017 Hospital Admission    She was admitted for management of subacute bowel obstruction      07/15/2017  Cancer Staging    Staging form: Colon and Rectum, AJCC 8th Edition - Clinical: Stage Unknown (cTX, cNX, pM1) - Signed by Heath Lark, MD on 07/15/2017      07/18/2017 Procedure    Successful placement of a right arm PICC with sonographic and fluoroscopic guidance. The catheter is ready for use.      08/04/2017 Imaging    The previously noted ascites in the abdomen and pelvis has resolved. 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  No small bowel or colonic obstruction is identified.  Generalized thickened bowel wall of the sigmoid colon.  Mixed cystic and solid mass of the left ovary is unchanged.  Status post prior cholecystectomy with mild postsurgical intrahepatic biliary ductal dilatation.       09/29/2017 Imaging    1. Interval resolution of small bowel obstruction. 2. Persistent findings of peritoneal carcinomatosis with improvement in ascites and probable improvement in the previously demonstrated ill-defined right lower quadrant mass. 3. Grossly stable complex left adnexal mass from recent prior studies. This was first demonstrated on the exam done 06/27/2017 and has enlarged since then, consistent with an ovarian metastasis. This abuts the sigmoid colon and may invade it. 4. Bladder wall thickening and surrounding soft tissue stranding consistent with cystitis, possibly treatment related.       10/05/2017 -  Chemotherapy    The patient had PALONOSETRON HCL INJECTION 0.25 MG/5ML, 0.25 mg, Intravenous,  Once, 0 of 6 cycles CARBOplatin (PARAPLATIN) in sodium chloride 0.9 % 100 mL chemo infusion, , Intravenous,  Once, 0 of 6 cycles PACLitaxel (TAXOL) 276 mg in sodium chloride 0.9 % 250 mL chemo infusion (> 7m/m2), 175 mg/m2, Intravenous,  Once, 0 of 6 cycles FOSAPREPITANT 150MG + DEXAMETHASONE INFUSION [facility], , Intravenous,  Once, 0 of 6 cycles  for chemotherapy treatment.        REVIEW OF SYSTEMS:   Constitutional: Denies fevers, chills  or abnormal weight loss Eyes: Denies blurriness of vision Ears, nose, mouth, throat, and face: Denies mucositis or sore throat Respiratory: Denies cough, dyspnea or wheezes Cardiovascular: Denies palpitation, chest discomfort or lower extremity swelling Gastrointestinal:  Denies nausea, heartburn or change in bowel habits Skin: Denies abnormal skin rashes Lymphatics: Denies new lymphadenopathy or easy bruising Neurological:Denies numbness, tingling or new weaknesses Behavioral/Psych: Mood is stable, no new changes  All other systems were reviewed with the patient and are negative.  I have reviewed the past medical history, past surgical history, social history and family history with the patient and they are unchanged from previous note.  ALLERGIES:  is allergic to flagyl [metronidazole].  MEDICATIONS:  Current Outpatient Medications  Medication Sig Dispense Refill  . aspirin EC 81 MG tablet Take 81 mg by mouth daily.    .Marland Kitchendicyclomine (BENTYL) 20 MG tablet Take 20 mg by mouth 4 (four) times daily -  before meals and at bedtime.    . diphenhydrAMINE (BENADRYL) 25 MG tablet Take 25 mg by mouth every 6 (six) hours as needed.    .Marland KitchenFLUoxetine (PROZAC) 20 MG tablet Take 20 mg by mouth daily.    .Marland KitchenLORazepam (ATIVAN) 1 MG tablet Take 1 mg by mouth every 6 (six) hours as needed.     .Marland Kitchen  morphine (MSIR) 15 MG tablet Take 15 mg by mouth every 8 (eight) hours.    . ondansetron (ZOFRAN) 4 MG tablet Take 4 mg by mouth every 8 (eight) hours as needed for nausea or vomiting.    . sennosides-docusate sodium (SENOKOT-S) 8.6-50 MG tablet Take 2-4 tablets by mouth 2 (two) times daily.    . traZODone (DESYREL) 50 MG tablet Take 12.5 mg by mouth at bedtime.     No current facility-administered medications for this visit.     PHYSICAL EXAMINATION: ECOG PERFORMANCE STATUS: 0 - Asymptomatic  Vitals:   09/30/17 0953  BP: (!) 102/49  Pulse: 63  Resp: 18  Temp: 97.8 F (36.6 C)  SpO2: 99%   Filed  Weights   09/30/17 0953  Weight: 120 lb 6.4 oz (54.6 kg)    GENERAL:alert, no distress and comfortable SKIN: skin color, texture, turgor are normal, no rashes or significant lesions Musculoskeletal:no cyanosis of digits and no clubbing  NEURO: alert & oriented x 3 with fluent speech, no focal motor/sensory deficits  LABORATORY DATA:  I have reviewed the data as listed    Component Value Date/Time   NA 143 09/26/2017   K 3.5 09/26/2017   CL 105 09/22/2017 1402   CO2 26 09/22/2017 1402   GLUCOSE 91 09/22/2017 1402   BUN 20 09/26/2017   CREATININE 0.6 09/26/2017   CREATININE 0.78 09/22/2017 1402   CALCIUM 8.9 09/22/2017 1402   PROT 6.6 09/22/2017 1402   PROT 6.7 11/28/2016 0849   ALBUMIN 3.3 (L) 09/22/2017 1402   ALBUMIN 4.3 11/28/2016 0849   AST 20 09/22/2017 1402   ALT 10 09/22/2017 1402   ALKPHOS 92 09/22/2017 1402   BILITOT 0.4 09/22/2017 1402   GFRNONAA >60 09/22/2017 1402   GFRAA >60 09/22/2017 1402    No results found for: SPEP, UPEP  Lab Results  Component Value Date   WBC 4.0 09/26/2017   NEUTROABS 2.8 09/22/2017   HGB 10.9 (A) 09/26/2017   HCT 33 (A) 09/26/2017   MCV 98.1 09/22/2017   PLT 136 (A) 09/26/2017      Chemistry      Component Value Date/Time   NA 143 09/26/2017   K 3.5 09/26/2017   CL 105 09/22/2017 1402   CO2 26 09/22/2017 1402   BUN 20 09/26/2017   CREATININE 0.6 09/26/2017   CREATININE 0.78 09/22/2017 1402   GLU 92 09/26/2017      Component Value Date/Time   CALCIUM 8.9 09/22/2017 1402   ALKPHOS 92 09/22/2017 1402   AST 20 09/22/2017 1402   ALT 10 09/22/2017 1402   BILITOT 0.4 09/22/2017 1402       RADIOGRAPHIC STUDIES: I have personally reviewed the radiological images as listed and agreed with the findings in the report. Ct Abdomen Pelvis W Contrast  Result Date: 09/29/2017 CLINICAL DATA:  Stage IV colon cancer diagnosed in March 2019 with previous peritoneal carcinomatosis. Ongoing chemotherapy. No current complaints.  EXAM: CT ABDOMEN AND PELVIS WITH CONTRAST TECHNIQUE: Multidetector CT imaging of the abdomen and pelvis was performed using the standard protocol following bolus administration of intravenous contrast. CONTRAST:  50m ISOVUE-300 IOPAMIDOL (ISOVUE-300) INJECTION 61% COMPARISON:  CT 08/13/2017 and 07/27/2017. FINDINGS: Lower chest: Clear lung bases. No significant pleural or pericardial effusion. Hepatobiliary: No focal hepatic lesions are identified. Intrahepatic and extrahepatic biliary dilatation is similar to the previous study and likely related to previous cholecystectomy. The common hepatic duct measures 9 mm in diameter. Pancreas: Unremarkable. No pancreatic ductal dilatation or  surrounding inflammatory changes. Spleen: Normal in size without focal abnormality. Adrenals/Urinary Tract: The adrenal glands appear stable without suspicious findings. There is a stable cyst in the upper pole of the left kidney. The kidneys otherwise appear normal. There is no urinary tract calculus or hydronephrosis. The bladder is incompletely distended, although demonstrates probable wall thickening and surrounding soft tissue stranding. Stomach/Bowel: There is no residual bowel distension or wall thickening. Contrast extends into the distal small bowel. There is moderate stool throughout the colon, especially in the rectum. Vascular/Lymphatic: There are no enlarged abdominal or pelvic lymph nodes. Aortic and branch vessel atherosclerosis. No acute vascular findings. Reproductive: A large complex cystic and solid mass involving the left adnexa is similar to multiple prior studies, measuring 4.3 x 3.9 cm transverse on image 57/2 and up to 8 cm on coronal image 38/4. This abuts the sigmoid colon. The uterus and right ovary appear normal. Other: The previously demonstrated mass-like density in the right lower quadrant contributing to the patient's distal small bowel obstruction is not well delineated and difficult to separate from  adjacent contrast enhanced small-bowel loops. It does appear improved. Likewise, there is improvement in the volume of ascites. There is mild diffuse soft tissue thickening of the peritoneal surfaces with mild omental nodularity consistent with carcinomatosis. This does not appear progressive. Musculoskeletal: No acute or significant osseous findings. IMPRESSION: 1. Interval resolution of small bowel obstruction. 2. Persistent findings of peritoneal carcinomatosis with improvement in ascites and probable improvement in the previously demonstrated ill-defined right lower quadrant mass. 3. Grossly stable complex left adnexal mass from recent prior studies. This was first demonstrated on the exam done 06/27/2017 and has enlarged since then, consistent with an ovarian metastasis. This abuts the sigmoid colon and may invade it. 4. Bladder wall thickening and surrounding soft tissue stranding consistent with cystitis, possibly treatment related. Electronically Signed   By: Richardean Sale M.D.   On: 09/29/2017 10:02    All questions were answered. The patient knows to call the clinic with any problems, questions or concerns. No barriers to learning was detected.  I spent 30 minutes counseling the patient face to face. The total time spent in the appointment was 40 minutes and more than 50% was on counseling and review of test results  Heath Lark, MD 09/30/2017 11:55 AM

## 2017-09-30 NOTE — Progress Notes (Signed)
START ON PATHWAY REGIMEN - Ovarian     A cycle is every 21 days:     Paclitaxel      Carboplatin   **Always confirm dose/schedule in your pharmacy ordering system**  Patient Characteristics: Newly Diagnosed, Neoadjuvant Therapy Therapeutic Status: Newly Diagnosed AJCC T Category: T3 AJCC N Category: N0 AJCC M Category: M1 AJCC 8 Stage Grouping: IV BRCA Mutation Status: Did Not Order Test Intent of Therapy: Non-Curative / Palliative Intent, Discussed with Patient

## 2017-09-30 NOTE — Assessment & Plan Note (Signed)
I have reviewed her case in great detail with GYN oncologist I reviewed multiple imaging studies with the patient and her niece Overall, she has positive response to treatment with resolution of ascites even though the ovarian mass looks a little bigger I am concerned about misdiagnosis of metastatic colon cancer It is also possible that her bowel obstruction was due to carcinomatosis from ovarian cancer Treatment choices would be drastically different She is off antiplatelet agents right now I recommend colonoscopy to look for intrinsic colon mass If colonoscopy is negative, I will treat her with ovarian cancer regimen I have discussed this with her gastroenterologist who will try to work her in soon In the meantime, I will consult advanced home care to resume weekly home care visit and PICC line care at home I will bring her back in as soon as I have results of her colonoscopy for further discussion We briefly discussed the current national guidelines for treatment of ovarian cancer and prognosis

## 2017-10-01 ENCOUNTER — Ambulatory Visit (AMBULATORY_SURGERY_CENTER): Payer: Self-pay

## 2017-10-01 ENCOUNTER — Other Ambulatory Visit: Payer: Self-pay

## 2017-10-01 VITALS — Ht 65.0 in | Wt 121.2 lb

## 2017-10-01 DIAGNOSIS — C189 Malignant neoplasm of colon, unspecified: Secondary | ICD-10-CM | POA: Diagnosis not present

## 2017-10-01 DIAGNOSIS — Z85038 Personal history of other malignant neoplasm of large intestine: Secondary | ICD-10-CM

## 2017-10-01 DIAGNOSIS — C786 Secondary malignant neoplasm of retroperitoneum and peritoneum: Secondary | ICD-10-CM | POA: Diagnosis not present

## 2017-10-01 MED ORDER — NA SULFATE-K SULFATE-MG SULF 17.5-3.13-1.6 GM/177ML PO SOLN: 1.0000 | Freq: Once | ORAL | 0 refills | Status: AC

## 2017-10-01 NOTE — Progress Notes (Signed)
Denies allergies to eggs or soy products. Denies complication of anesthesia or sedation. Denies use of weight loss medication. Denies use of O2.   Emmi instructions declined.   A sample of Suprep was given to patient.

## 2017-10-02 ENCOUNTER — Telehealth: Payer: Self-pay | Admitting: Interventional Cardiology

## 2017-10-02 ENCOUNTER — Telehealth: Payer: Self-pay

## 2017-10-02 ENCOUNTER — Other Ambulatory Visit: Payer: Self-pay | Admitting: Hematology and Oncology

## 2017-10-02 DIAGNOSIS — C562 Malignant neoplasm of left ovary: Secondary | ICD-10-CM

## 2017-10-02 DIAGNOSIS — C801 Malignant (primary) neoplasm, unspecified: Secondary | ICD-10-CM

## 2017-10-02 DIAGNOSIS — C786 Secondary malignant neoplasm of retroperitoneum and peritoneum: Secondary | ICD-10-CM

## 2017-10-02 NOTE — Telephone Encounter (Signed)
Georgina Snell called with [facility] and left a message. Faythe refused care today. She does not want to be seen until next week.

## 2017-10-02 NOTE — Telephone Encounter (Signed)
Pt recently hospitalized for small bowel blockage.  States she was diagnosed with cancer as well.  States MDs are unsure if it is Colon CA or Ovarian CA.  Pt was sent to Hospice from hospital but has now been home for 2 days.  Pt has not had Brilinta since early May.  She was to d/c Brilinta completely in June.  Pt wants to know if she should continue this for a little longer or go ahead and continue with the plan to d/c it?  Pt also mentioned she is having a colonoscopy on Monday with possible biopsy.  Will send to Dr. Tamala Julian for review.

## 2017-10-02 NOTE — Telephone Encounter (Signed)
Informed pt to stay off the [place].  Pt appreciative for call.

## 2017-10-02 NOTE — Telephone Encounter (Signed)
New message:      Pt c/o medication issue:  1. Name of Medication: Brilinta  2. How are you currently taking this medication (dosage and times per day)?   3. Are you having a reaction (difficulty breathing--STAT)? No  4. What is your medication issue? Pt states she has been in the hospital so she has been off of this medication for a month now. Pt needs to know if she needs to start back on this medication.

## 2017-10-02 NOTE — Telephone Encounter (Signed)
Stay off [place]. DC Brilinta.

## 2017-10-03 ENCOUNTER — Inpatient Hospital Stay: Payer: PRIVATE HEALTH INSURANCE | Admitting: Physician Assistant

## 2017-10-06 ENCOUNTER — Ambulatory Visit (AMBULATORY_SURGERY_CENTER): Payer: Medicare Other | Admitting: Gastroenterology

## 2017-10-06 ENCOUNTER — Encounter: Payer: Self-pay | Admitting: Gastroenterology

## 2017-10-06 ENCOUNTER — Other Ambulatory Visit: Payer: Self-pay

## 2017-10-06 ENCOUNTER — Ambulatory Visit (HOSPITAL_COMMUNITY)
Admission: RE | Admit: 2017-10-06 | Discharge: 2017-10-06 | Disposition: A | Discharge status: Home / Self Care | Payer: Medicare Other | Source: Ambulatory Visit | Attending: Gastroenterology | Admitting: Gastroenterology

## 2017-10-06 VITALS — BP 144/75 | HR 67 | Temp 97.1°F | Resp 9 | Ht 65.0 in | Wt 120.0 lb

## 2017-10-06 DIAGNOSIS — K56609 Unspecified intestinal obstruction, unspecified as to partial versus complete obstruction: Secondary | ICD-10-CM | POA: Diagnosis not present

## 2017-10-06 DIAGNOSIS — Z85038 Personal history of other malignant neoplasm of large intestine: Secondary | ICD-10-CM

## 2017-10-06 DIAGNOSIS — C772 Secondary and unspecified malignant neoplasm of intra-abdominal lymph nodes: Secondary | ICD-10-CM | POA: Diagnosis not present

## 2017-10-06 DIAGNOSIS — C800 Disseminated malignant neoplasm, unspecified: Secondary | ICD-10-CM | POA: Diagnosis not present

## 2017-10-06 DIAGNOSIS — C786 Secondary malignant neoplasm of retroperitoneum and peritoneum: Secondary | ICD-10-CM | POA: Diagnosis not present

## 2017-10-06 DIAGNOSIS — F341 Dysthymic disorder: Secondary | ICD-10-CM | POA: Diagnosis not present

## 2017-10-06 DIAGNOSIS — K639 Disease of intestine, unspecified: Secondary | ICD-10-CM

## 2017-10-06 DIAGNOSIS — I2510 Atherosclerotic heart disease of native coronary artery without angina pectoris: Secondary | ICD-10-CM | POA: Diagnosis not present

## 2017-10-06 MED ORDER — SODIUM CHLORIDE 0.9 % IV SOLN: 500.0000 mL | Freq: Once | INTRAVENOUS | Status: DC

## 2017-10-06 NOTE — Patient Instructions (Signed)
Handouts given:  Hemorrhoids  YOU HAD AN ENDOSCOPIC PROCEDURE TODAY AT THE [facility]:   Refer to the procedure report that was given to you for any specific questions about what was found during the examination.  If the procedure report does not answer your questions, please call your gastroenterologist to clarify.  If you requested that your care partner not be given the details of your procedure findings, then the procedure report has been included in a sealed envelope for you to review at your convenience later.  YOU SHOULD EXPECT: Some feelings of bloating in the abdomen. Passage of more gas than usual.  Walking can help get rid of the air that was put into your GI tract during the procedure and reduce the bloating. If you had a lower endoscopy (such as a colonoscopy or flexible sigmoidoscopy) you may notice spotting of blood in your stool or on the toilet paper. If you underwent a bowel prep for your procedure, you may not have a normal bowel movement for a few days.  Please Note:  You might notice some irritation and congestion in your nose or some drainage.  This is from the oxygen used during your procedure.  There is no need for concern and it should clear up in a day or so.  SYMPTOMS TO REPORT IMMEDIATELY:   Following lower endoscopy (colonoscopy or flexible sigmoidoscopy):  Excessive amounts of blood in the stool  Significant tenderness or worsening of abdominal pains  Swelling of the abdomen that is new, acute  Fever of 100F or higher  For urgent or emergent issues, a gastroenterologist can be reached at any hour by calling [phone].   DIET:  We do recommend a small meal at first, but then you may proceed to your regular diet.  Drink plenty of fluids but you should avoid alcoholic beverages for 24 hours.  ACTIVITY:  You should plan to take it easy for the rest of today and you should NOT DRIVE or use heavy machinery until tomorrow (because of the sedation  medicines used during the test).    FOLLOW UP: Our staff will call the number listed on your records the next business day following your procedure to check on you and address any questions or concerns that you may have regarding the information given to you following your procedure. If we do not reach you, we will leave a message.  However, if you are feeling well and you are not experiencing any problems, there is no need to return our call.  We will assume that you have returned to your regular daily activities without incident.  If any biopsies were taken you will be contacted by phone or by letter within the next 1-3 weeks.  Please call us at [phone] if you have not heard about the biopsies in 3 weeks.    SIGNATURES/CONFIDENTIALITY: You and/or your care partner have signed paperwork which will be entered into your electronic medical record.  These signatures attest to the fact that that the information above on your After Visit Summary has been reviewed and is understood.  Full responsibility of the confidentiality of this discharge information lies with you and/or your care-partner. 

## 2017-10-06 NOTE — Op Note (Signed)
[place] Patient Name: Carol Ferguson Procedure Date: 10/06/2017 1:44 PM MRN: [id number] Endoscopist: Mauri Pole , MD Age: 67 Referring MD:  Date of Birth: March 06, 1951 Gender: Female Account #: [id number] Procedure:                Colonoscopy Indications:              Peritoneal metastatic disease with carcinomatosis.                            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 The patient's tolerance of                            previous anesthesia was also reviewed. The risks                            and benefits of the procedure and the sedation                            options and risks were discussed with the patient.                            All questions were answered, and informed consent                            was obtained. Prior Anticoagulants: The patient has                            taken no previous anticoagulant or antiplatelet                            agents. ASA Grade Assessment: III - A patient with                            severe systemic disease. After reviewing the risks                            and benefits, the patient was deemed in                            satisfactory condition to undergo the procedure.  After obtaining informed consent, the colonoscope                            was passed under direct vision. Throughout the                            procedure, the patient's blood pressure, pulse, and                            oxygen saturations were monitored continuously. The                            Colonoscope was introduced through the anus and   advanced to the the sigmoid colon for evaluation.                            The colonoscopy was extremely difficult due to                            restricted mobility of the colon. Switched to adult                            endoscope but unable to advance any further. The                            patient tolerated the procedure well. The quality                            of the bowel preparation was excellent. The rectum                            was photographed. Scope In: 1:49:16 PM Scope Out: 2:08:01 PM Total Procedure Duration: 0 hours 18 minutes 45 seconds  Findings:                 The perianal and digital rectal examinations were                            normal.                           The rectum and visualized sigmoid colon mucosa                            appeared normal. Scope couldnt be extended beyond                            40cm due to extrinsic compression and fixed sigmoid                            colon.                           Non-bleeding internal hemorrhoids were found during  retroflexion. The hemorrhoids were small. Complications:            No immediate complications. Estimated Blood Loss:     Estimated blood loss: none. Impression:               - The rectum and sigmoid colon are normal.                           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                           - No specimens collected. Recommendation:           - Patient has a contact number available for                            emergencies. The signs and symptoms of potential                            delayed complications were discussed with the                            patient. Return to normal activities tomorrow.                            Written discharge instructions were provided to the                             patient.                           - Resume previous diet.                           - Continue present medications.                           - Perform a single contrast barium enema today.                           - Follow up with Dr Claretha Cooper Mauri Pole, MD 10/06/2017 2:17:16 PM This report has been signed electronically.

## 2017-10-06 NOTE — Progress Notes (Signed)
Report to PACU, RN, vss, BBS= Clear.  

## 2017-10-06 NOTE — Progress Notes (Signed)
Pt's states no medical or surgical changes since previsit or office visit. Client has PIC line on right arm.

## 2017-10-07 ENCOUNTER — Telehealth: Payer: Self-pay | Admitting: Gastroenterology

## 2017-10-07 ENCOUNTER — Other Ambulatory Visit: Payer: Self-pay

## 2017-10-07 ENCOUNTER — Telehealth: Payer: Self-pay

## 2017-10-07 ENCOUNTER — Telehealth: Payer: Self-pay | Admitting: Hematology and Oncology

## 2017-10-07 ENCOUNTER — Other Ambulatory Visit: Payer: Self-pay | Admitting: Hematology and Oncology

## 2017-10-07 DIAGNOSIS — C562 Malignant neoplasm of left ovary: Secondary | ICD-10-CM

## 2017-10-07 DIAGNOSIS — Z7189 Other specified counseling: Secondary | ICD-10-CM

## 2017-10-07 NOTE — Telephone Encounter (Signed)
Pls call Carol Ferguson, She wants to know if pt needs a sooner appt. Thank you

## 2017-10-07 NOTE — Telephone Encounter (Signed)
I spoke with the GI physician Her overall impression is that her presentation is not consistent with colon cancer The mucosal lining seen on colonoscopy looks healthy She felt thatCT colonography will not be helpful Based on her overall presentation, recent imaging findings, I feel it is appropriate to proceed with treatment for ovarian cancer as soon as possible.

## 2017-10-07 NOTE — Telephone Encounter (Signed)
  Follow up Call-  Call back number 10/06/2017  Post procedure Call Back phone  # [phone]  Permission to leave phone message Yes  Some recent data might be hidden     Patient questions:  Do you have a fever, pain , or abdominal swelling? No. Pain Score  0 *  Have you tolerated food without any problems? Yes.    Have you been able to return to your normal activities? Yes.    Do you have any questions about your discharge instructions: Diet   No. Medications  No. Follow up visit  No.  Do you have questions or concerns about your Care? No.  Actions: * If pain score is 4 or above: No action needed, pain <4.

## 2017-10-07 NOTE — Telephone Encounter (Signed)
Patient wanted to speak to Dr. Silverio Decamp, so she was verbally told this morning at 0830 am.

## 2017-10-08 DIAGNOSIS — I429 Cardiomyopathy, unspecified: Secondary | ICD-10-CM | POA: Diagnosis not present

## 2017-10-08 DIAGNOSIS — F329 Major depressive disorder, single episode, unspecified: Secondary | ICD-10-CM | POA: Diagnosis not present

## 2017-10-08 DIAGNOSIS — C189 Malignant neoplasm of colon, unspecified: Secondary | ICD-10-CM | POA: Diagnosis not present

## 2017-10-08 DIAGNOSIS — Z7982 Long term (current) use of aspirin: Secondary | ICD-10-CM | POA: Diagnosis not present

## 2017-10-08 DIAGNOSIS — C562 Malignant neoplasm of left ovary: Secondary | ICD-10-CM | POA: Diagnosis not present

## 2017-10-08 DIAGNOSIS — I2510 Atherosclerotic heart disease of native coronary artery without angina pectoris: Secondary | ICD-10-CM | POA: Diagnosis not present

## 2017-10-08 DIAGNOSIS — E43 Unspecified severe protein-calorie malnutrition: Secondary | ICD-10-CM | POA: Diagnosis not present

## 2017-10-08 DIAGNOSIS — M5136 Other intervertebral disc degeneration, lumbar region: Secondary | ICD-10-CM | POA: Diagnosis not present

## 2017-10-08 DIAGNOSIS — K56699 Other intestinal obstruction unspecified as to partial versus complete obstruction: Secondary | ICD-10-CM | POA: Diagnosis not present

## 2017-10-08 DIAGNOSIS — F411 Generalized anxiety disorder: Secondary | ICD-10-CM | POA: Diagnosis not present

## 2017-10-08 DIAGNOSIS — Z452 Encounter for adjustment and management of vascular access device: Secondary | ICD-10-CM | POA: Diagnosis not present

## 2017-10-08 DIAGNOSIS — C786 Secondary malignant neoplasm of retroperitoneum and peritoneum: Secondary | ICD-10-CM | POA: Diagnosis not present

## 2017-10-08 DIAGNOSIS — E119 Type 2 diabetes mellitus without complications: Secondary | ICD-10-CM | POA: Diagnosis not present

## 2017-10-08 DIAGNOSIS — M1990 Unspecified osteoarthritis, unspecified site: Secondary | ICD-10-CM | POA: Diagnosis not present

## 2017-10-08 DIAGNOSIS — G893 Neoplasm related pain (acute) (chronic): Secondary | ICD-10-CM | POA: Diagnosis not present

## 2017-10-08 DIAGNOSIS — Z87891 Personal history of nicotine dependence: Secondary | ICD-10-CM | POA: Diagnosis not present

## 2017-10-08 DIAGNOSIS — K5790 Diverticulosis of intestine, part unspecified, without perforation or abscess without bleeding: Secondary | ICD-10-CM | POA: Diagnosis not present

## 2017-10-08 NOTE — Telephone Encounter (Signed)
Spoke with [place]. The patient will not be scheduled for a virtual colonoscopy at this time. She will see oncology first. Sherrie George for working on getting the patient a sooner appointment.

## 2017-10-09 ENCOUNTER — Inpatient Hospital Stay: Payer: Medicare Other

## 2017-10-09 ENCOUNTER — Telehealth: Payer: Self-pay | Admitting: Hematology and Oncology

## 2017-10-09 ENCOUNTER — Telehealth: Payer: Self-pay

## 2017-10-09 ENCOUNTER — Encounter: Payer: Self-pay | Admitting: Hematology and Oncology

## 2017-10-09 ENCOUNTER — Inpatient Hospital Stay (HOSPITAL_BASED_OUTPATIENT_CLINIC_OR_DEPARTMENT_OTHER): Payer: Medicare Other | Admitting: Hematology and Oncology

## 2017-10-09 DIAGNOSIS — C562 Malignant neoplasm of left ovary: Secondary | ICD-10-CM

## 2017-10-09 DIAGNOSIS — G893 Neoplasm related pain (acute) (chronic): Secondary | ICD-10-CM

## 2017-10-09 DIAGNOSIS — G620 Drug-induced polyneuropathy: Secondary | ICD-10-CM | POA: Diagnosis not present

## 2017-10-09 DIAGNOSIS — C189 Malignant neoplasm of colon, unspecified: Secondary | ICD-10-CM | POA: Diagnosis not present

## 2017-10-09 DIAGNOSIS — Z7189 Other specified counseling: Secondary | ICD-10-CM

## 2017-10-09 DIAGNOSIS — K5909 Other constipation: Secondary | ICD-10-CM | POA: Diagnosis not present

## 2017-10-09 DIAGNOSIS — T451X5A Adverse effect of antineoplastic and immunosuppressive drugs, initial encounter: Secondary | ICD-10-CM

## 2017-10-09 DIAGNOSIS — C786 Secondary malignant neoplasm of retroperitoneum and peritoneum: Secondary | ICD-10-CM | POA: Diagnosis not present

## 2017-10-09 DIAGNOSIS — Z5111 Encounter for antineoplastic chemotherapy: Secondary | ICD-10-CM | POA: Diagnosis not present

## 2017-10-09 LAB — CBC WITH DIFFERENTIAL (CANCER CENTER ONLY)
BASOS PCT: 0 %
Basophils Absolute: 0 10*3/uL (ref 0.0–0.1)
EOS ABS: 0.1 10*3/uL (ref 0.0–0.5)
EOS PCT: 1 %
HCT: 39.9 % (ref 34.8–46.6)
Hemoglobin: 13.2 g/dL (ref 11.6–15.9)
Lymphocytes Relative: 31 %
Lymphs Abs: 1.5 10*3/uL (ref 0.9–3.3)
MCH: 33.3 pg (ref 25.1–34.0)
MCHC: 33.1 g/dL (ref 31.5–36.0)
MCV: 100.8 fL (ref 79.5–101.0)
MONOS PCT: 11 %
Monocytes Absolute: 0.5 10*3/uL (ref 0.1–0.9)
Neutro Abs: 2.6 10*3/uL (ref 1.5–6.5)
Neutrophils Relative %: 57 %
PLATELETS: 227 10*3/uL (ref 145–400)
RBC: 3.96 MIL/uL (ref 3.70–5.45)
RDW: 14.8 % — ABNORMAL HIGH (ref 11.2–14.5)
WBC: 4.6 10*3/uL (ref 3.9–10.3)

## 2017-10-09 LAB — CMP (CANCER CENTER ONLY)
ALK PHOS: 87 U/L (ref 40–150)
ALT: 7 U/L (ref 0–55)
AST: 15 U/L (ref 5–34)
Albumin: 3.4 g/dL — ABNORMAL LOW (ref 3.5–5.0)
Anion gap: 7 (ref 3–11)
BUN: 16 mg/dL (ref 7–26)
CALCIUM: 9.2 mg/dL (ref 8.4–10.4)
CO2: 27 mmol/L (ref 22–29)
CREATININE: 0.74 mg/dL (ref 0.60–1.10)
Chloride: 106 mmol/L (ref 98–109)
Glucose, Bld: 90 mg/dL (ref 70–140)
Potassium: 4 mmol/L (ref 3.5–5.1)
Sodium: 140 mmol/L (ref 136–145)
Total Bilirubin: 0.4 mg/dL (ref 0.2–1.2)
Total Protein: 6.8 g/dL (ref 6.4–8.3)

## 2017-10-09 MED ORDER — DEXAMETHASONE 4 MG PO TABS: ORAL_TABLET | ORAL | 0 refills | Status: DC

## 2017-10-09 MED ORDER — ONDANSETRON HCL 8 MG PO TABS: 8.0000 mg | ORAL_TABLET | Freq: Three times a day (TID) | ORAL | 3 refills | Status: AC | PRN

## 2017-10-09 MED ORDER — HEPARIN SOD (PORK) LOCK FLUSH 100 UNIT/ML IV SOLN
250.0000 [IU] | Freq: Once | INTRAVENOUS | Status: AC
  Administered 2017-10-09: 250 [IU]
  Filled 2017-10-09: qty 5

## 2017-10-09 MED ORDER — SODIUM CHLORIDE 0.9% FLUSH
10.0000 mL | Freq: Once | INTRAVENOUS | Status: AC
  Administered 2017-10-09: 10 mL
  Filled 2017-10-09: qty 10

## 2017-10-09 NOTE — Telephone Encounter (Signed)
Per MD on 7/12 in order to get everything on one day patient does not need flush

## 2017-10-09 NOTE — Telephone Encounter (Signed)
Called and left below message. Instructed to call for questions. 

## 2017-10-09 NOTE — Assessment & Plan Note (Addendum)
I have reviewed recommendation with the patient and her niece in great detail Overall impression is the patient has metastatic ovarian cancer causing peritoneal carcinomatosis We reviewed the NCCN guidelines We discussed the role of chemotherapy. The intent is of curative intent although likelihood of complete response to slim.  We discussed some of the risks, benefits, side-effects of carboplatin & Taxol. Treatment is intravenous, every 3 weeks x 6 cycles  Some of the short term side-effects included, though not limited to, including weight loss, life threatening infections, risk of allergic reactions, need for transfusions of blood products, nausea, vomiting, change in bowel habits, loss of hair, admission to hospital for various reasons, and risks of death.   Long term side-effects are also discussed including risks of infertility, permanent damage to nerve function, hearing loss, chronic fatigue, kidney damage with possibility needing hemodialysis, and rare secondary malignancy including bone marrow disorders.  The patient is aware that the response rates discussed earlier is not guaranteed.  After a long discussion, patient made an informed decision to proceed with the prescribed plan of care.   Patient education material was dispensed. We discussed premedication with dexamethasone before chemotherapy. She will continue to use the PICC line for now instead of port placement Advanced home care service will continue PICC line dressing changes weekly along with weekly nursing evaluation to watch out for bowel obstructive symptoms I will not proceed with G-CSF for now

## 2017-10-09 NOTE — Assessment & Plan Note (Signed)
She has minimum pain She will take morphine sulfate as needed

## 2017-10-09 NOTE — Assessment & Plan Note (Signed)
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

## 2017-10-09 NOTE — Telephone Encounter (Signed)
Nurse with [facility] called and left message yesterday. Requesting verbal orders to see once a week for 9 weeks.

## 2017-10-09 NOTE — Telephone Encounter (Signed)
Please proceed.

## 2017-10-09 NOTE — Progress Notes (Signed)
[facility] OFFICE PROGRESS NOTE  Patient Care Team: Donzetta Kohut as PCP - General (Physician Assistant) [name], MD as Consulting Physician (Obstetrics and Gynecology) Mauri Pole, MD as Consulting Physician (Gastroenterology) Belva Crome, MD as Consulting Physician (Cardiology)  ASSESSMENT & PLAN:  Disseminated ovarian cancer, left [facility]) I have reviewed recommendation with the patient and her niece in great detail Overall impression is the patient has metastatic ovarian cancer causing peritoneal carcinomatosis We reviewed the NCCN guidelines We discussed the role of chemotherapy. The intent is of curative intent although likelihood of complete response to slim.  We discussed some of the risks, benefits, side-effects of carboplatin & Taxol. Treatment is intravenous, every 3 weeks x 6 cycles  Some of the short term side-effects included, though not limited to, including weight loss, life threatening infections, risk of allergic reactions, need for transfusions of blood products, nausea, vomiting, change in bowel habits, loss of hair, admission to hospital for various reasons, and risks of death.   Long term side-effects are also discussed including risks of infertility, permanent damage to nerve function, hearing loss, chronic fatigue, kidney damage with possibility needing hemodialysis, and rare secondary malignancy including bone marrow disorders.  The patient is aware that the response rates discussed earlier is not guaranteed.  After a long discussion, patient made an informed decision to proceed with the prescribed plan of care.   Patient education material was dispensed. We discussed premedication with dexamethasone before chemotherapy. She will continue to use the PICC line for now instead of port placement Advanced home care service will continue PICC line dressing changes weekly along with weekly nursing evaluation to watch out for bowel  obstructive symptoms I will not proceed with G-CSF for now   Cancer related pain She has minimum pain She will take morphine sulfate as needed  Peripheral neuropathy due to chemotherapy [facility]) She has minimum peripheral neuropathy from before I plan 20% dose reduction for Taxol   No orders of the defined types were placed in this encounter.   INTERVAL HISTORY: Please see below for problem oriented charting. She returns with her niece for further follow-up I had extensive discussion with GI service recently She is ready to proceed with chemotherapy as scheduled tomorrow She had very mild residual peripheral neuropathy from prior treatment She denies recent nausea, vomiting or signs or symptoms of bowel obstruction  SUMMARY OF ONCOLOGIC HISTORY: Oncology History   MSI stable     Disseminated ovarian cancer, left ([place])   12/17/2016 Imaging    US pelvis 1. There appears to be a 2.2 x 1.9 x 2.0 cm mass centered in the cervix, likely extending into the lower uterus. Recommend correlation with physical exam. An MRI could better evaluate. 2. Thickened endometrium measuring 12 mm. Recommend gynecologic consultation and endometrial sampling given patient age. 3. Probable fibroid in the uterus.      12/27/2016 Pathology Results    Endocervix, curettage - BENIGN ENDOCERVICAL MUCOSA AND BENIGN SQUAMOUS MUCOSA      01/01/2017 Imaging    MRI pelvis 1. No classic intermediate to high signal intensity tissue disrupting the hypointense fibrous cervical stroma as typically seen in the setting of cervical carcinoma. However, there is asymmetric thickening of the posterior cervix with anterior displacement of the cervical canal and associated widening and heterogeneity of the endometrial cavity. As such, imaging features are indeterminate by MRI and close follow-up or tissue sampling is recommended. 2. Apparent areas of tethering/scarring between the sigmoid colon  posterior uterus/cervix.  Diverticuli are noted in the sigmoid colon and this may be related to scarring from prior bouts is diverticulitis. 3. Small volume intraperitoneal free fluid.      01/28/2017 Pathology Results    1. Cervix, biopsy, posterior - SPINDLE CELL LESION CONSISTENT WITH SMOOTH MUSCLE NEOPLASM, SEE COMMENT. 2. Endometrium, curettage - BENIGN SQUAMOUS MUCOSA. - BENIGN SMOOTH MUSCLE. - NO ENDOMETRIUM PRESENT. Microscopic Comment 1. There is a spindle cell lesion with focal atypia. There is no necrosis or increase in mitotic figures. The sample is somewhat limited, but the differential includes a cellular leiomyoma.      01/28/2017 Surgery    Surgery: ultrasound guided dilation of cervix with D&C and cervical biopsy  Surgeons:  Donaciano Eva, MD  Operative findings: 6cm uterus, dilated endometrial cavity, very stenotic cervical os. Thickened, fibrotic cervix without discrete mass, grossly normal cervical mucosa.         03/22/2017 Imaging    CT abdomen and pelvis 1. Diffuse prominent fluid-filled small bowel with mesenteric edema, enteritis pattern. This may be infectious or inflammatory. There is no evidence of obstruction. 2. Mild omental stranding in the left abdomen likely reactive secondary to primary bowel process, omental caking is not entirely excluded. 3. Sigmoid colonic diverticulosis without diverticulitis. 4. Prominent heterogeneous endometrium, recommend correlation with recent D and C. 5.  Aortic Atherosclerosis (ICD10-I70.0).      06/26/2017 Imaging    US pelvis T/V images. Anteverted and heterogenous uterus measuring 5.85 x 3.87 x 3.36 cm. Right subserous fibroid with calcifications measuring 2.7 x 2.6 cm. Endometrial line appears thickened at 8.1 mm. Cervix appears normal, no mass seen. Right ovary not seen. Left adnexal mass cystic/solid measuring 7.6 x 6.0 x 7.1 cm. Positive color flow Doppler.No free fluid in the posterior cul-de-sac.      06/26/2017 Tumor  Marker    Patient's tumor was tested for the following markers: CA-125 Results of the tumor marker test revealed 90      06/26/2017 Tumor Marker    Patient's tumor was tested for the following markers: CEA Results of the tumor marker test revealed 4.5      06/27/2017 Imaging    Ct abdomen and pelvis New 7.4 cm left adnexal mass with worsening abdominal ascites and omental nodularity, suggesting metastatic ovarian carcinoma.      07/02/2017 Pathology Results    Cervix, biopsy, ectocervix - SQUAMOUS MUCOSA WITH ATYPICAL INFILTRATE WITHIN FIBROMUSCULAR STROMA. - SEE MICROSCOPIC DESCRIPTION. Microscopic Comment There is a small fragment of squamous mucosa with a small portion of benign squamous epithelium. Within the fibromuscular stroma there are strands and nests of epithelioid cells, which have an infiltrating pattern and there are a few with cytoplasmic vacuoles. The findings are worrisome for an infiltrating neoplastic process and additional tissue may be indicated. There is insufficient tissue remaining in the current biopsy for additional studies.      07/11/2017 Procedure    CT-guided biopsy of the peritoneal thickening in the left lateral abdomen. Procedure was technically challenging due to the small amount of peritoneal thickening and close proximity of multiple bowel loops. False negative biopsy is possible.      07/11/2017 Pathology Results    Soft Tissue Needle Core Biopsy, omentum - ADENOCARCINOMA. - SEE COMMENT. Microscopic Comment The malignant cells are positive for CDX-2, cytokeratin 20, and p53. They are negative for cytokeratin 7, estrogen receptor, and PAX-8. This immunohistochemical profile argues against a gynecologic primary, and the differential diagnosis includes gastrointestinal primary.  07/12/2017 Imaging    Ct abdomen and pelvis 1. New small bowel obstruction with transition point seen in the right lower quadrant, possibly secondary to adhesions related to  peritoneal carcinomatosis. No pneumatosis. 2. Findings suggestive of metastatic ovarian cancer. Slightly worsened abdominal ascites may be related to metastatic disease or reactive to the small-bowel obstruction. 3. Expected post biopsy changes in the left lower quadrant adjacent to peritoneal nodularity. No intra-abdominal hemorrhage.      07/12/2017 - 07/14/2017 Hospital Admission    She was admitted for management of subacute bowel obstruction      07/15/2017 Cancer Staging    Staging form: Ovary, Fallopian Tube, and Primary Peritoneal Carcinoma, AJCC 8th Edition - Clinical: cT3, cN0, cM0 - Signed by Heath Lark, MD on 10/09/2017      07/18/2017 Procedure    Successful placement of a right arm PICC with sonographic and fluoroscopic guidance. The catheter is ready for use.      08/04/2017 Imaging    The previously noted ascites in the abdomen and pelvis has resolved. 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  No small bowel or colonic obstruction is identified.  Generalized thickened bowel wall of the sigmoid colon.  Mixed cystic and solid mass of the left ovary is unchanged.  Status post prior cholecystectomy with mild postsurgical intrahepatic biliary ductal dilatation.       09/29/2017 Imaging    1. Interval resolution of small bowel obstruction. 2. Persistent findings of peritoneal carcinomatosis with improvement in ascites and probable improvement in the previously demonstrated ill-defined right lower quadrant mass. 3. Grossly stable complex left adnexal mass from recent prior studies. This was first demonstrated on the exam done 06/27/2017 and has enlarged since then, consistent with an ovarian metastasis. This abuts the sigmoid colon and may invade it. 4. Bladder wall thickening and surrounding soft tissue stranding consistent with cystitis, possibly treatment related.       10/05/2017 -  Chemotherapy    The patient had PALONOSETRON HCL  INJECTION 0.25 MG/5ML, 0.25 mg, Intravenous,  Once, 0 of 6 cycles CARBOplatin (PARAPLATIN) in sodium chloride 0.9 % 100 mL chemo infusion, , Intravenous,  Once, 0 of 6 cycles PACLitaxel (TAXOL) 276 mg in sodium chloride 0.9 % 250 mL chemo infusion (> 36m/m2), 175 mg/m2, Intravenous,  Once, 0 of 6 cycles FOSAPREPITANT 150MG + DEXAMETHASONE INFUSION [facility], , Intravenous,  Once, 0 of 6 cycles  for chemotherapy treatment.       10/06/2017 Imaging    Incomplete barium enema due to severe, near complete obstruction of an irregular process in the sigmoid colon. The irregularity of this lesion is most concerning for colon cancer.       REVIEW OF SYSTEMS:   Constitutional: Denies fevers, chills or abnormal weight loss Eyes: Denies blurriness of vision Ears, nose, mouth, throat, and face: Denies mucositis or sore throat Respiratory: Denies cough, dyspnea or wheezes Cardiovascular: Denies palpitation, chest discomfort or lower extremity swelling Gastrointestinal:  Denies nausea, heartburn or change in bowel habits Skin: Denies abnormal skin rashes Lymphatics: Denies new lymphadenopathy or easy bruising Neurological:Denies numbness, tingling or new weaknesses Behavioral/Psych: Mood is stable, no new changes  All other systems were reviewed with the patient and are negative.  I have reviewed the past medical history, past surgical history, social history and family history with the patient and they are unchanged from previous note.  ALLERGIES:  is allergic to flagyl [metronidazole].  MEDICATIONS:  Current Outpatient Medications  Medication Sig Dispense Refill  .  aspirin EC 81 MG tablet Take 81 mg by mouth daily.    Marland Kitchen dexamethasone (DECADRON) 4 MG tablet Take 5 tabs at the night before and 5 tab the morning of chemotherapy, every 3 weeks, by mouth with food 60 tablet 0  . dicyclomine (BENTYL) 20 MG tablet Take 20 mg by mouth 4 (four) times daily -  before meals and at bedtime.    Marland Kitchen FLUoxetine  (PROZAC) 20 MG tablet Take 20 mg by mouth daily.    Marland Kitchen LORazepam (ATIVAN) 1 MG tablet Take 1 mg by mouth every 6 (six) hours as needed.     Marland Kitchen morphine (MSIR) 15 MG tablet Take 15 mg by mouth every 8 (eight) hours.    . ondansetron (ZOFRAN) 8 MG tablet Take 1 tablet (8 mg total) by mouth every 8 (eight) hours as needed for nausea. 30 tablet 3  . sennosides-docusate sodium (SENOKOT-S) 8.6-50 MG tablet Take 2-4 tablets by mouth 2 (two) times daily.    . traZODone (DESYREL) 50 MG tablet Take 12.5 mg by mouth at bedtime.     Current Facility-Administered Medications  Medication Dose Route Frequency Provider Last Rate Last Dose  . 0.9 %  sodium chloride infusion  500 mL Intravenous Once Nandigam, Venia Minks, MD        PHYSICAL EXAMINATION: ECOG PERFORMANCE STATUS: 1 - Symptomatic but completely ambulatory  Vitals:   10/09/17 0940  BP: 94/72  Pulse: 72  Resp: 18  Temp: 97.6 F (36.4 C)  SpO2: 100%   Filed Weights   10/09/17 0940  Weight: 113 lb 12.8 oz (51.6 kg)    GENERAL:alert, no distress and comfortable SKIN: skin color, texture, turgor are normal, no rashes or significant lesions EYES: normal, Conjunctiva are pink and non-injected, sclera clear OROPHARYNX:no exudate, no erythema and lips, buccal mucosa, and tongue normal  NECK: supple, thyroid normal size, non-tender, without nodularity LYMPH:  no palpable lymphadenopathy in the cervical, axillary or inguinal LUNGS: clear to auscultation and percussion with normal breathing effort HEART: regular rate & rhythm and no murmurs and no lower extremity edema ABDOMEN:abdomen soft, non-tender and normal bowel sounds Musculoskeletal:no cyanosis of digits and no clubbing  NEURO: alert & oriented x 3 with fluent speech, no focal motor/sensory deficits  LABORATORY DATA:  I have reviewed the data as listed    Component Value Date/Time   NA 140 10/09/2017 0856   NA 143 09/26/2017   K 4.0 10/09/2017 0856   CL 106 10/09/2017 0856   CO2  27 10/09/2017 0856   GLUCOSE 90 10/09/2017 0856   BUN 16 10/09/2017 0856   BUN 20 09/26/2017   CREATININE 0.74 10/09/2017 0856   CALCIUM 9.2 10/09/2017 0856   PROT 6.8 10/09/2017 0856   PROT 6.7 11/28/2016 0849   ALBUMIN 3.4 (L) 10/09/2017 0856   ALBUMIN 4.3 11/28/2016 0849   AST 15 10/09/2017 0856   ALT 7 10/09/2017 0856   ALKPHOS 87 10/09/2017 0856   BILITOT 0.4 10/09/2017 0856   GFRNONAA >60 10/09/2017 0856   GFRAA >60 10/09/2017 0856    No results found for: SPEP, UPEP  Lab Results  Component Value Date   WBC 4.6 10/09/2017   NEUTROABS 2.6 10/09/2017   HGB 13.2 10/09/2017   HCT 39.9 10/09/2017   MCV 100.8 10/09/2017   PLT 227 10/09/2017      Chemistry      Component Value Date/Time   NA 140 10/09/2017 0856   NA 143 09/26/2017   K 4.0 10/09/2017 0856  CL 106 10/09/2017 0856   CO2 27 10/09/2017 0856   BUN 16 10/09/2017 0856   BUN 20 09/26/2017   CREATININE 0.74 10/09/2017 0856   GLU 92 09/26/2017      Component Value Date/Time   CALCIUM 9.2 10/09/2017 0856   ALKPHOS 87 10/09/2017 0856   AST 15 10/09/2017 0856   ALT 7 10/09/2017 0856   BILITOT 0.4 10/09/2017 0856       RADIOGRAPHIC STUDIES: I have personally reviewed the radiological images as listed and agreed with the findings in the report. Ct Abdomen Pelvis W Contrast  Result Date: 09/29/2017 CLINICAL DATA:  Stage IV colon cancer diagnosed in March 2019 with previous peritoneal carcinomatosis. Ongoing chemotherapy. No current complaints. EXAM: CT ABDOMEN AND PELVIS WITH CONTRAST TECHNIQUE: Multidetector CT imaging of the abdomen and pelvis was performed using the standard protocol following bolus administration of intravenous contrast. CONTRAST:  24m ISOVUE-300 IOPAMIDOL (ISOVUE-300) INJECTION 61% COMPARISON:  CT 08/13/2017 and 07/27/2017. FINDINGS: Lower chest: Clear lung bases. No significant pleural or pericardial effusion. Hepatobiliary: No focal hepatic lesions are identified. Intrahepatic and  extrahepatic biliary dilatation is similar to the previous study and likely related to previous cholecystectomy. The common hepatic duct measures 9 mm in diameter. Pancreas: Unremarkable. No pancreatic ductal dilatation or surrounding inflammatory changes. Spleen: Normal in size without focal abnormality. Adrenals/Urinary Tract: The adrenal glands appear stable without suspicious findings. There is a stable cyst in the upper pole of the left kidney. The kidneys otherwise appear normal. There is no urinary tract calculus or hydronephrosis. The bladder is incompletely distended, although demonstrates probable wall thickening and surrounding soft tissue stranding. Stomach/Bowel: There is no residual bowel distension or wall thickening. Contrast extends into the distal small bowel. There is moderate stool throughout the colon, especially in the rectum. Vascular/Lymphatic: There are no enlarged abdominal or pelvic lymph nodes. Aortic and branch vessel atherosclerosis. No acute vascular findings. Reproductive: A large complex cystic and solid mass involving the left adnexa is similar to multiple prior studies, measuring 4.3 x 3.9 cm transverse on image 57/2 and up to 8 cm on coronal image 38/4. This abuts the sigmoid colon. The uterus and right ovary appear normal. Other: The previously demonstrated mass-like density in the right lower quadrant contributing to the patient's distal small bowel obstruction is not well delineated and difficult to separate from adjacent contrast enhanced small-bowel loops. It does appear improved. Likewise, there is improvement in the volume of ascites. There is mild diffuse soft tissue thickening of the peritoneal surfaces with mild omental nodularity consistent with carcinomatosis. This does not appear progressive. Musculoskeletal: No acute or significant osseous findings. IMPRESSION: 1. Interval resolution of small bowel obstruction. 2. Persistent findings of peritoneal carcinomatosis with  improvement in ascites and probable improvement in the previously demonstrated ill-defined right lower quadrant mass. 3. Grossly stable complex left adnexal mass from recent prior studies. This was first demonstrated on the exam done 06/27/2017 and has enlarged since then, consistent with an ovarian metastasis. This abuts the sigmoid colon and may invade it. 4. Bladder wall thickening and surrounding soft tissue stranding consistent with cystitis, possibly treatment related. Electronically Signed   By: WRichardean SaleM.D.   On: 09/29/2017 10:02   Dg Colon W/cm - Wo/w Kub  Result Date: 10/06/2017 CLINICAL DATA:  Incomplete colonoscopy. Unable to pass colonoscope. Peritoneal metastatic disease with carcinomatosis. EXAM: BE WITH CONTRAST - WITHOUT AND WITH KUB FLUOROSCOPY TIME:  Fluoroscopy Time:  2 minutes 42 seconds Radiation Exposure Index (if provided  by the fluoroscopic device): Number of Acquired Spot Images: 0 COMPARISON:  CT 09/29/2017 FINDINGS: Scout film shows gas throughout the colon. No free air. Prior cholecystectomy. There is near complete obstruction in the sigmoid colon region. There is a moderate length segment of marked narrowing and irregularity noted. Only a small amount of contrast passes through this area. Contrast could only be advanced to approximately the splenic flexure. Study is suboptimal and incomplete due to inability to pass significant amount of contrast through the sigmoid colon constricting lesion. IMPRESSION: Incomplete barium enema due to severe, near complete obstruction of an irregular process in the sigmoid colon. The irregularity of this lesion is most concerning for colon cancer. Electronically Signed   By: Rolm Baptise M.D.   On: 10/06/2017 16:33    All questions were answered. The patient knows to call the clinic with any problems, questions or concerns. No barriers to learning was detected.  I spent 25 minutes counseling the patient face to face. The total time  spent in the appointment was 30 minutes and more than 50% was on counseling and review of test results  Heath Lark, MD 10/09/2017 10:46 AM

## 2017-10-09 NOTE — Patient Instructions (Signed)
Carboplatin injection What is this medicine? CARBOPLATIN (KAR boe pla tin) is a chemotherapy drug. It targets fast dividing cells, like cancer cells, and causes these cells to die. This medicine is used to treat ovarian cancer and many other cancers. This medicine may be used for other purposes; ask your health care provider or pharmacist if you have questions. COMMON BRAND NAME(S): Paraplatin What should I tell my health care provider before I take this medicine? They need to know if you have any of these conditions: -blood disorders -hearing problems -kidney disease -recent or ongoing radiation therapy -an unusual or allergic reaction to carboplatin, cisplatin, other chemotherapy, other medicines, foods, dyes, or preservatives -pregnant or trying to get pregnant -breast-feeding How should I use this medicine? This drug is usually given as an infusion into a vein. It is administered in a hospital or clinic by a specially trained health care professional. Talk to your pediatrician regarding the use of this medicine in children. Special care may be needed. Overdosage: If you think you have taken too much of this medicine contact a poison control center or emergency room at once. NOTE: This medicine is only for you. Do not share this medicine with others. What if I miss a dose? It is important not to miss a dose. Call your doctor or health care professional if you are unable to keep an appointment. What may interact with this medicine? -medicines for seizures -medicines to increase blood counts like filgrastim, pegfilgrastim, sargramostim -some antibiotics like amikacin, gentamicin, neomycin, streptomycin, tobramycin -vaccines Talk to your doctor or health care professional before taking any of these medicines: -acetaminophen -aspirin -ibuprofen -ketoprofen -naproxen This list may not describe all possible interactions. Give your health care provider a list of all the medicines, herbs,  non-prescription drugs, or dietary supplements you use. Also tell them if you smoke, drink alcohol, or use illegal drugs. Some items may interact with your medicine. What should I watch for while using this medicine? Your condition will be monitored carefully while you are receiving this medicine. You will need important blood work done while you are taking this medicine. This drug may make you feel generally unwell. This is not uncommon, as chemotherapy can affect healthy cells as well as cancer cells. Report any side effects. Continue your course of treatment even though you feel ill unless your doctor tells you to stop. In some cases, you may be given additional medicines to help with side effects. Follow all directions for their use. Call your doctor or health care professional for advice if you get a fever, chills or sore throat, or other symptoms of a cold or flu. Do not treat yourself. This drug decreases your body's ability to fight infections. Try to avoid being around people who are sick. This medicine may increase your risk to bruise or bleed. Call your doctor or health care professional if you notice any unusual bleeding. Be careful brushing and flossing your teeth or using a toothpick because you may get an infection or bleed more easily. If you have any dental work done, tell your dentist you are receiving this medicine. Avoid taking products that contain aspirin, acetaminophen, ibuprofen, naproxen, or ketoprofen unless instructed by your doctor. These medicines may hide a fever. Do not become pregnant while taking this medicine. Women should inform their doctor if they wish to become pregnant or think they might be pregnant. There is a potential for serious side effects to an unborn child. Talk to your health care professional or   pharmacist for more information. Do not breast-feed an infant while taking this medicine. What side effects may I notice from receiving this medicine? Side effects  that you should report to your doctor or health care professional as soon as possible: -allergic reactions like skin rash, itching or hives, swelling of the face, lips, or tongue -signs of infection - fever or chills, cough, sore throat, pain or difficulty passing urine -signs of decreased platelets or bleeding - bruising, pinpoint red spots on the skin, black, tarry stools, nosebleeds -signs of decreased red blood cells - unusually weak or tired, fainting spells, lightheadedness -breathing problems -changes in hearing -changes in vision -chest pain -high blood pressure -low blood counts - This drug may decrease the number of white blood cells, red blood cells and platelets. You may be at increased risk for infections and bleeding. -nausea and vomiting -pain, swelling, redness or irritation at the injection site -pain, tingling, numbness in the hands or feet -problems with balance, talking, walking -trouble passing urine or change in the amount of urine Side effects that usually do not require medical attention (report to your doctor or health care professional if they continue or are bothersome): -hair loss -loss of appetite -metallic taste in the mouth or changes in taste This list may not describe all possible side effects. Call your doctor for medical advice about side effects. You may report side effects to FDA at 1-800-FDA-1088. Where should I keep my medicine? This drug is given in a hospital or clinic and will not be stored at home. NOTE: This sheet is a summary. It may not cover all possible information. If you have questions about this medicine, talk to your doctor, pharmacist, or health care provider.  2018 Elsevier/Gold Standard (2007-07-14 14:38:05) Paclitaxel injection What is this medicine? PACLITAXEL (PAK li TAX el) is a chemotherapy drug. It targets fast dividing cells, like cancer cells, and causes these cells to die. This medicine is used to treat ovarian cancer, breast  cancer, and other cancers. This medicine may be used for other purposes; ask your health care provider or pharmacist if you have questions. COMMON BRAND NAME(S): Onxol, Taxol What should I tell my health care provider before I take this medicine? They need to know if you have any of these conditions: -blood disorders -irregular heartbeat -infection (especially a virus infection such as chickenpox, cold sores, or herpes) -liver disease -previous or ongoing radiation therapy -an unusual or allergic reaction to paclitaxel, alcohol, polyoxyethylated castor oil, other chemotherapy agents, other medicines, foods, dyes, or preservatives -pregnant or trying to get pregnant -breast-feeding How should I use this medicine? This drug is given as an infusion into a vein. It is administered in a hospital or clinic by a specially trained health care professional. Talk to your pediatrician regarding the use of this medicine in children. Special care may be needed. Overdosage: If you think you have taken too much of this medicine contact a poison control center or emergency room at once. NOTE: This medicine is only for you. Do not share this medicine with others. What if I miss a dose? It is important not to miss your dose. Call your doctor or health care professional if you are unable to keep an appointment. What may interact with this medicine? Do not take this medicine with any of the following medications: -disulfiram -metronidazole This medicine may also interact with the following medications: -cyclosporine -diazepam -ketoconazole -medicines to increase blood counts like filgrastim, pegfilgrastim, sargramostim -other chemotherapy drugs like cisplatin,   doxorubicin, epirubicin, etoposide, teniposide, vincristine -quinidine -testosterone -vaccines -verapamil Talk to your doctor or health care professional before taking any of these  medicines: -acetaminophen -aspirin -ibuprofen -ketoprofen -naproxen This list may not describe all possible interactions. Give your health care provider a list of all the medicines, herbs, non-prescription drugs, or dietary supplements you use. Also tell them if you smoke, drink alcohol, or use illegal drugs. Some items may interact with your medicine. What should I watch for while using this medicine? Your condition will be monitored carefully while you are receiving this medicine. You will need important blood work done while you are taking this medicine. This medicine can cause serious allergic reactions. To reduce your risk you will need to take other medicine(s) before treatment with this medicine. If you experience allergic reactions like skin rash, itching or hives, swelling of the face, lips, or tongue, tell your doctor or health care professional right away. In some cases, you may be given additional medicines to help with side effects. Follow all directions for their use. This drug may make you feel generally unwell. This is not uncommon, as chemotherapy can affect healthy cells as well as cancer cells. Report any side effects. Continue your course of treatment even though you feel ill unless your doctor tells you to stop. Call your doctor or health care professional for advice if you get a fever, chills or sore throat, or other symptoms of a cold or flu. Do not treat yourself. This drug decreases your body's ability to fight infections. Try to avoid being around people who are sick. This medicine may increase your risk to bruise or bleed. Call your doctor or health care professional if you notice any unusual bleeding. Be careful brushing and flossing your teeth or using a toothpick because you may get an infection or bleed more easily. If you have any dental work done, tell your dentist you are receiving this medicine. Avoid taking products that contain aspirin, acetaminophen, ibuprofen,  naproxen, or ketoprofen unless instructed by your doctor. These medicines may hide a fever. Do not become pregnant while taking this medicine. Women should inform their doctor if they wish to become pregnant or think they might be pregnant. There is a potential for serious side effects to an unborn child. Talk to your health care professional or pharmacist for more information. Do not breast-feed an infant while taking this medicine. Men are advised not to father a child while receiving this medicine. This product may contain alcohol. Ask your pharmacist or healthcare provider if this medicine contains alcohol. Be sure to tell all healthcare providers you are taking this medicine. Certain medicines, like metronidazole and disulfiram, can cause an unpleasant reaction when taken with alcohol. The reaction includes flushing, headache, nausea, vomiting, sweating, and increased thirst. The reaction can last from 30 minutes to several hours. What side effects may I notice from receiving this medicine? Side effects that you should report to your doctor or health care professional as soon as possible: -allergic reactions like skin rash, itching or hives, swelling of the face, lips, or tongue -low blood counts - This drug may decrease the number of white blood cells, red blood cells and platelets. You may be at increased risk for infections and bleeding. -signs of infection - fever or chills, cough, sore throat, pain or difficulty passing urine -signs of decreased platelets or bleeding - bruising, pinpoint red spots on the skin, black, tarry stools, nosebleeds -signs of decreased red blood cells - unusually weak or   tired, fainting spells, lightheadedness -breathing problems -chest pain -high or low blood pressure -mouth sores -nausea and vomiting -pain, swelling, redness or irritation at the injection site -pain, tingling, numbness in the hands or feet -slow or irregular heartbeat -swelling of the ankle,  feet, hands Side effects that usually do not require medical attention (report to your doctor or health care professional if they continue or are bothersome): -bone pain -complete hair loss including hair on your head, underarms, pubic hair, eyebrows, and eyelashes -changes in the color of fingernails -diarrhea -loosening of the fingernails -loss of appetite -muscle or joint pain -red flush to skin -sweating This list may not describe all possible side effects. Call your doctor for medical advice about side effects. You may report side effects to FDA at 1-800-FDA-1088. Where should I keep my medicine? This drug is given in a hospital or clinic and will not be stored at home. NOTE: This sheet is a summary. It may not cover all possible information. If you have questions about this medicine, talk to your doctor, pharmacist, or health care provider.  2018 Elsevier/Gold Standard (2015-02-07 19:58:00)  

## 2017-10-10 ENCOUNTER — Inpatient Hospital Stay: Payer: Medicare Other

## 2017-10-10 VITALS — BP 117/65 | HR 67 | Temp 98.4°F | Resp 18

## 2017-10-10 DIAGNOSIS — Z7189 Other specified counseling: Secondary | ICD-10-CM

## 2017-10-10 DIAGNOSIS — C562 Malignant neoplasm of left ovary: Secondary | ICD-10-CM | POA: Diagnosis not present

## 2017-10-10 DIAGNOSIS — Z5111 Encounter for antineoplastic chemotherapy: Secondary | ICD-10-CM | POA: Diagnosis not present

## 2017-10-10 DIAGNOSIS — K5909 Other constipation: Secondary | ICD-10-CM | POA: Diagnosis not present

## 2017-10-10 DIAGNOSIS — C786 Secondary malignant neoplasm of retroperitoneum and peritoneum: Secondary | ICD-10-CM | POA: Diagnosis not present

## 2017-10-10 DIAGNOSIS — G620 Drug-induced polyneuropathy: Secondary | ICD-10-CM | POA: Diagnosis not present

## 2017-10-10 DIAGNOSIS — C189 Malignant neoplasm of colon, unspecified: Secondary | ICD-10-CM | POA: Diagnosis not present

## 2017-10-10 LAB — CA 125: Cancer Antigen (CA) 125: 29.4 U/mL (ref 0.0–38.1)

## 2017-10-10 MED ORDER — FAMOTIDINE IN NACL 20-0.9 MG/50ML-% IV SOLN
INTRAVENOUS | Status: AC
  Filled 2017-10-10: qty 50

## 2017-10-10 MED ORDER — SODIUM CHLORIDE 0.9 % IV SOLN
415.8000 mg | Freq: Once | INTRAVENOUS | Status: AC
  Administered 2017-10-10: 420 mg via INTRAVENOUS
  Filled 2017-10-10: qty 42

## 2017-10-10 MED ORDER — SODIUM CHLORIDE 0.9% FLUSH
10.0000 mL | INTRAVENOUS | Status: DC | PRN
  Administered 2017-10-10: 10 mL
  Filled 2017-10-10: qty 10

## 2017-10-10 MED ORDER — HEPARIN SOD (PORK) LOCK FLUSH 100 UNIT/ML IV SOLN
250.0000 [IU] | Freq: Once | INTRAVENOUS | Status: AC | PRN
  Administered 2017-10-10: 250 [IU]
  Filled 2017-10-10: qty 5

## 2017-10-10 MED ORDER — DIPHENHYDRAMINE HCL 50 MG/ML IJ SOLN
INTRAMUSCULAR | Status: AC
  Filled 2017-10-10: qty 1

## 2017-10-10 MED ORDER — PALONOSETRON HCL INJECTION 0.25 MG/5ML
INTRAVENOUS | Status: AC
Start: 2017-10-10 — End: ?
  Filled 2017-10-10: qty 5

## 2017-10-10 MED ORDER — DIPHENHYDRAMINE HCL 50 MG/ML IJ SOLN
50.0000 mg | Freq: Once | INTRAMUSCULAR | Status: AC
  Administered 2017-10-10: 50 mg via INTRAVENOUS

## 2017-10-10 MED ORDER — FOSAPREPITANT DIMEGLUMINE INJECTION 150 MG
Freq: Once | INTRAVENOUS | Status: AC
  Administered 2017-10-10: 11:00:00 via INTRAVENOUS
  Filled 2017-10-10: qty 5

## 2017-10-10 MED ORDER — SODIUM CHLORIDE 0.9 % IV SOLN
Freq: Once | INTRAVENOUS | Status: AC
  Administered 2017-10-10: 10:00:00 via INTRAVENOUS

## 2017-10-10 MED ORDER — PALONOSETRON HCL INJECTION 0.25 MG/5ML
0.2500 mg | Freq: Once | INTRAVENOUS | Status: AC
  Administered 2017-10-10: 0.25 mg via INTRAVENOUS

## 2017-10-10 MED ORDER — SODIUM CHLORIDE 0.9 % IV SOLN
140.0000 mg/m2 | Freq: Once | INTRAVENOUS | Status: AC
  Administered 2017-10-10: 216 mg via INTRAVENOUS
  Filled 2017-10-10: qty 36

## 2017-10-10 MED ORDER — FAMOTIDINE IN NACL 20-0.9 MG/50ML-% IV SOLN
20.0000 mg | Freq: Once | INTRAVENOUS | Status: AC
  Administered 2017-10-10: 20 mg via INTRAVENOUS

## 2017-10-10 NOTE — Patient Instructions (Addendum)
[facility] Discharge Instructions for Patients Receiving Chemotherapy  Today you received the following chemotherapy agents :  Taxol,  Carboplatin.  To help prevent nausea and vomiting after your treatment, we encourage you to take your nausea medication as prescribed.  TAKE ZOFRAN 8 MG BY MOUTH EVERY 8 HOURS AS NEEDED FOR NAUSEA AND VOMITING. DO NOT EAT  GREASY NOR SPICY FOODS.  DO DRINK LOTS OF FLUIDS AS TOLERATED.    If you develop nausea and vomiting that is not controlled by your nausea medication, call the clinic.   BELOW ARE SYMPTOMS THAT SHOULD BE REPORTED IMMEDIATELY:  *FEVER GREATER THAN 100.5 F  *CHILLS WITH OR WITHOUT FEVER  NAUSEA AND VOMITING THAT IS NOT CONTROLLED WITH YOUR NAUSEA MEDICATION  *UNUSUAL SHORTNESS OF BREATH  *UNUSUAL BRUISING OR BLEEDING  TENDERNESS IN MOUTH AND THROAT WITH OR WITHOUT PRESENCE OF ULCERS  *URINARY PROBLEMS  *BOWEL PROBLEMS  UNUSUAL RASH Items with * indicate a potential emergency and should be followed up as soon as possible.  Feel free to call the clinic should you have any questions or concerns. The clinic phone number is [phone].  Please show the [place] at check-in to the Emergency Department and triage nurse.

## 2017-10-13 ENCOUNTER — Telehealth: Payer: Self-pay | Admitting: *Deleted

## 2017-10-13 NOTE — Telephone Encounter (Signed)
Called patient for chemo follow up. LM to call Dr Calton Dach office if any questions or concerns

## 2017-10-13 NOTE — Telephone Encounter (Signed)
-----   Message from Jarvis Morgan, RN sent at 10/10/2017  4:41 PM EDT ----- Regarding: Chemo follow up First time Taxol,  Carboplatin ,  Dr. Cletus Gash,  TB, RN.

## 2017-10-14 ENCOUNTER — Other Ambulatory Visit: Payer: Self-pay | Admitting: Adult Health

## 2017-10-14 DIAGNOSIS — C189 Malignant neoplasm of colon, unspecified: Secondary | ICD-10-CM | POA: Diagnosis not present

## 2017-10-14 DIAGNOSIS — I2510 Atherosclerotic heart disease of native coronary artery without angina pectoris: Secondary | ICD-10-CM | POA: Diagnosis not present

## 2017-10-14 DIAGNOSIS — I429 Cardiomyopathy, unspecified: Secondary | ICD-10-CM | POA: Diagnosis not present

## 2017-10-14 DIAGNOSIS — C562 Malignant neoplasm of left ovary: Secondary | ICD-10-CM | POA: Diagnosis not present

## 2017-10-14 DIAGNOSIS — C786 Secondary malignant neoplasm of retroperitoneum and peritoneum: Secondary | ICD-10-CM | POA: Diagnosis not present

## 2017-10-14 DIAGNOSIS — K56699 Other intestinal obstruction unspecified as to partial versus complete obstruction: Secondary | ICD-10-CM | POA: Diagnosis not present

## 2017-10-16 ENCOUNTER — Other Ambulatory Visit: Payer: Self-pay | Admitting: Hematology and Oncology

## 2017-10-16 ENCOUNTER — Telehealth: Payer: Self-pay

## 2017-10-16 DIAGNOSIS — C189 Malignant neoplasm of colon, unspecified: Secondary | ICD-10-CM | POA: Diagnosis not present

## 2017-10-16 DIAGNOSIS — I2510 Atherosclerotic heart disease of native coronary artery without angina pectoris: Secondary | ICD-10-CM | POA: Diagnosis not present

## 2017-10-16 DIAGNOSIS — K56699 Other intestinal obstruction unspecified as to partial versus complete obstruction: Secondary | ICD-10-CM | POA: Diagnosis not present

## 2017-10-16 DIAGNOSIS — C562 Malignant neoplasm of left ovary: Secondary | ICD-10-CM | POA: Diagnosis not present

## 2017-10-16 DIAGNOSIS — I429 Cardiomyopathy, unspecified: Secondary | ICD-10-CM | POA: Diagnosis not present

## 2017-10-16 DIAGNOSIS — C786 Secondary malignant neoplasm of retroperitoneum and peritoneum: Secondary | ICD-10-CM | POA: Diagnosis not present

## 2017-10-16 MED ORDER — TRAZODONE HCL 50 MG PO TABS: 50.0000 mg | ORAL_TABLET | Freq: Every evening | ORAL | 2 refills | Status: DC | PRN

## 2017-10-16 MED ORDER — LORAZEPAM 1 MG PO TABS: 1.0000 mg | ORAL_TABLET | Freq: Four times a day (QID) | ORAL | 0 refills | Status: DC | PRN

## 2017-10-16 NOTE — Telephone Encounter (Signed)
Called and left a message for Pam with [facility] regarding below message. Ask her to call nurse back at the office.

## 2017-10-16 NOTE — Telephone Encounter (Signed)
Called and given below message. She verbalized understanding.  She has not been feeling well since her treatment on Friday. She has not had a appetite. She feels better today and has eaten today. She gave herself IV fluids on Tuesday and after her fluids she could not flush her picc line. She called the nurse with [facility] with no answer and she called the main number at [facility] yesterday. She is unable to give IV fluids due to PICC not flushing and was told yesterday by [facility] that she was not for daily IV fluids.

## 2017-10-16 NOTE — Telephone Encounter (Signed)
-----   Message from Heath Lark, MD sent at 10/16/2017  7:21 AM EDT ----- 1) prescriptions on desk and signed 2) The dose of trazodone in prior medication list indicated 12.5. The dose seems incorrect. I am writing for 50 mg. She can take 1/2 if she felt it was too strong ----- Message ----- From: Patton Salles, RN Sent: 10/15/2017   4:22 PM To: Heath Lark, MD  Pt is requesting ativan and trazadone refills.

## 2017-10-16 NOTE — Telephone Encounter (Signed)
Called her back and told her I talked with Pam at [facility]. A nurse with [facility] will come out today to evaluate PICC and given IV fluids peripheral if needed. She verbalized understanding. Instructed to call for concerns.

## 2017-10-22 ENCOUNTER — Other Ambulatory Visit: Payer: Self-pay | Admitting: Adult Health

## 2017-10-23 DIAGNOSIS — C189 Malignant neoplasm of colon, unspecified: Secondary | ICD-10-CM | POA: Diagnosis not present

## 2017-10-23 DIAGNOSIS — C562 Malignant neoplasm of left ovary: Secondary | ICD-10-CM | POA: Diagnosis not present

## 2017-10-23 DIAGNOSIS — I429 Cardiomyopathy, unspecified: Secondary | ICD-10-CM | POA: Diagnosis not present

## 2017-10-23 DIAGNOSIS — I2510 Atherosclerotic heart disease of native coronary artery without angina pectoris: Secondary | ICD-10-CM | POA: Diagnosis not present

## 2017-10-23 DIAGNOSIS — C786 Secondary malignant neoplasm of retroperitoneum and peritoneum: Secondary | ICD-10-CM | POA: Diagnosis not present

## 2017-10-23 DIAGNOSIS — K56699 Other intestinal obstruction unspecified as to partial versus complete obstruction: Secondary | ICD-10-CM | POA: Diagnosis not present

## 2017-10-27 ENCOUNTER — Telehealth: Payer: Self-pay | Admitting: *Deleted

## 2017-10-27 ENCOUNTER — Other Ambulatory Visit

## 2017-10-27 ENCOUNTER — Other Ambulatory Visit: Payer: Self-pay | Admitting: Hematology and Oncology

## 2017-10-27 MED ORDER — MORPHINE SULFATE 15 MG PO TABS: 15.0000 mg | ORAL_TABLET | Freq: Three times a day (TID) | ORAL | 0 refills | Status: DC

## 2017-10-27 MED ORDER — LORAZEPAM 1 MG PO TABS: 1.0000 mg | ORAL_TABLET | Freq: Four times a day (QID) | ORAL | 0 refills | Status: DC | PRN

## 2017-10-27 NOTE — Telephone Encounter (Signed)
Pt states she needs a refill of MSIR and Ativan.

## 2017-10-28 ENCOUNTER — Other Ambulatory Visit: Payer: Self-pay

## 2017-10-28 ENCOUNTER — Ambulatory Visit (INDEPENDENT_AMBULATORY_CARE_PROVIDER_SITE_OTHER): Payer: Medicare Other | Admitting: Physician Assistant

## 2017-10-28 ENCOUNTER — Encounter: Payer: Self-pay | Admitting: Physician Assistant

## 2017-10-28 VITALS — BP 116/72 | HR 106 | Temp 98.0°F | Resp 16 | Ht 64.96 in | Wt 112.0 lb

## 2017-10-28 DIAGNOSIS — I25118 Atherosclerotic heart disease of native coronary artery with other forms of angina pectoris: Secondary | ICD-10-CM | POA: Diagnosis not present

## 2017-10-28 DIAGNOSIS — F419 Anxiety disorder, unspecified: Secondary | ICD-10-CM

## 2017-10-28 DIAGNOSIS — F329 Major depressive disorder, single episode, unspecified: Secondary | ICD-10-CM

## 2017-10-28 DIAGNOSIS — C562 Malignant neoplasm of left ovary: Secondary | ICD-10-CM | POA: Diagnosis not present

## 2017-10-28 DIAGNOSIS — F32A Depression, unspecified: Secondary | ICD-10-CM

## 2017-10-28 MED ORDER — FLUOXETINE HCL 20 MG PO TABS: 20.0000 mg | ORAL_TABLET | Freq: Every day | ORAL | 1 refills | Status: DC

## 2017-10-28 NOTE — Progress Notes (Signed)
Carol Ferguson  MRN: [id number] DOB: [birth year]  Subjective:  Carol Ferguson is a 67 y.o. female seen in office today for a chief complaint of follow-up and anxiety.  Since last visit in 02/2017, a lot has changed.  Patient has gone back and forth with receiving diagnoses of ovarian cancer versus colon cancer.  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  Has received multiple rounds of chemotherapy.  Has ended up with a small bowel obstruction and placed in hospice.  She graduated hospice.  Cannot talk about her experience in hospice as it was very traumatic.  Notes the increase her Prozac dose from 10 mg to 20 mg while in the hospital and she believes this is helping.  Her oncologist, [name], is managing her Ativan which she is taking 1 mg twice daily.  Notes this really helps with the edge and also helps with nausea.  She does not want to change any of her medications at this time as she feels like her anxiety is controlled as it can be for her situation.  Will occasionally have difficulty staying asleep throughout the entire night.  Has follow-up chemo session later this week.  Denies suicidal ideation or homicidal ideation.  Admits to not have any time to go to therapy as it is hard enough to just make at home during the day.  Support system consists of her neice, whom she is really close with.  Of note, hospice discontinued her Brilinta and cardiology recommended she not restart medication.  No other questions or concerns.  Review of Systems  Constitutional: Positive for fatigue. Negative for fever.  Cardiovascular: Negative for chest pain.  Gastrointestinal: Negative for abdominal pain.  Psychiatric/Behavioral: Positive for sleep disturbance. Negative for hallucinations and self-injury.     Patient Active Problem List   Diagnosis Date Noted  . Depression 09/21/2017  . Insomnia 09/21/2017  . Abdominal pain   . Terminal care   . Abdominal distention   . Cancer  related pain   . Palliative care by specialist   . Protein-calorie malnutrition, severe 08/15/2017  . Other constipation 08/04/2017  . 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 Mild protein-calorie malnutrition ([place]) 08/04/2017  . Anxiety state 07/16/2017  . Nausea without vomiting 07/16/2017  . Weight loss 07/16/2017  . 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 Goals of care, counseling/discussion 07/15/2017  . SBO (small bowel obstruction) ([place]) 07/12/2017  . Left tubo-ovarian mass 07/02/2017  . Peritoneal carcinomatosis ([place]) 07/02/2017  . Diverticulosis 01/10/2017  . Cervical mass 12/27/2016  . CAD (coronary artery disease), native coronary artery 11/27/2016  . Chronic diastolic heart failure ([place]) 09/27/2016  . Hyperlipidemia with target LDL less than 70 09/27/2016  . 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 Lumbar degenerative disc disease 06/14/2016    Current Outpatient Medications on File Prior to Visit  Medication Sig Dispense Refill  . aspirin EC 81 MG tablet Take 81 mg by mouth daily.    Marland Kitchen FLUoxetine (PROZAC) 20 MG tablet Take 20 mg by mouth daily.    Marland Kitchen LORazepam (ATIVAN) 1 MG tablet Take 1 tablet (1 mg total) by mouth every 6 (six) hours as needed. 60 tablet 0  . morphine (MSIR) 15 MG tablet Take 1 tablet (15 mg total) by mouth every 8 (eight) hours. 60 tablet 0  . ondansetron (ZOFRAN) 8 MG tablet Take 1 tablet (8 mg total) by mouth every 8 (eight) hours as needed  for nausea. 30 tablet 3  . sennosides-docusate sodium (SENOKOT-S) 8.6-50 MG tablet Take 2-4 tablets by mouth 2 (two) times daily.    . traZODone (DESYREL) 50 MG tablet Take 1 tablet (50 mg total) by mouth at bedtime as needed for sleep. 30 tablet 2   Current Facility-Administered Medications on File Prior to Visit  Medication Dose Route Frequency Provider Last Rate Last Dose  . 0.9 %  sodium chloride infusion  500 mL Intravenous Once Nandigam, Venia Minks, MD         Allergies  Allergen Reactions  . Flagyl [Metronidazole] Nausea Only     Objective:  BP 116/72   Pulse (!) 106   Temp 98 F (36.7 C) (Oral)   Resp 16   Ht 5' 4.96" (1.65 m)   Wt 112 lb (50.8 kg)   SpO2 98%   BMI 18.66 kg/m   Physical Exam  Constitutional: She is oriented to person, place, and time.  Thin appearing.   HENT:  Head: Normocephalic and atraumatic.  Eyes: Conjunctivae are normal.  Neck: Normal range of motion.  Pulmonary/Chest: Effort normal.  Neurological: She is alert and oriented to person, place, and time.  Skin: Skin is warm and dry.  Psychiatric: She has a normal mood and affect.  Tearful when discussing hospice.   Vitals reviewed.  GAD 7 : Generalized Anxiety Score 10/28/2017 03/06/2017 11/06/2016  Nervous, Anxious, on Edge 3 1 3   Control/stop worrying 2 0 1  Worry too much - different things 1 1 0  Trouble relaxing 1 0 3  Restless 3 0 3  Easily annoyed or irritable 0 1 1  Afraid - awful might happen 1 0 3  Total GAD 7 Score 11 3 14   Anxiety Difficulty - Not difficult at all -      Wt Readings from Last 3 Encounters:  10/28/17 112 lb (50.8 kg)  10/09/17 113 lb 12.8 oz (51.6 kg)  10/06/17 120 lb (54.4 kg)    Assessment and Plan :  1. Anxiety and depression For current circumstances, anxiety appears to be controlled.  Will continue with current dose of Prozac 20 mg at this time.  [name] is managing Ativan prescription at this time.  Patient would likely benefit from counseling due to her current situation.  However, this may not be feasible due to her current health state.  She reports just wanting to make it through her chemo sessions first.  May use melatonin at night to help with sleep disturbance.  Patient encouraged to follow-up here as needed.  2. Disseminated ovarian cancer, left ([place]) Follow with [name] which is planned.  Meds ordered this encounter  Medications  . FLUoxetine (PROZAC) 20 MG tablet    Sig: Take 1  tablet (20 mg total) by mouth daily.    Dispense:  90 tablet    Refill:  1    Order Specific Question:   Supervising Provider    Answer:   Wardell Honour [[id number]]   A total of 25 minutes was spent in the room with the patient, greater than 50% of which was in counseling/coordination of care regarding anxiety.  Tenna Delaine PA-C  [facility] 10/28/2017 4:34 PM

## 2017-10-28 NOTE — Patient Instructions (Addendum)
I have given you refills for prozac. Please continue taking as prescribed.   For sleep, try over the counter melatonin. I recommend trying half of a tablet of the 1mg  dissolving tablets at night time. Ask the pharmacist where you can get these at over the counter.   Melatonin product labeling states that melatonin may enhance the sedative properties of benzodiazepines (like ativan) and non-benzodiazepine hypnotics and patients should be monitored for such effects.The mechanism of this interaction has not been investigated, but additive and synergistic effects on sleep induction may contribute. Therefore, if you develop any daytime fogginess, confusion, memory issues,  or increased nausea please stop medication.   Thank you for letting me participate in your health and well being.     IF you received an x-ray today, you will receive an invoice from [facility]. Please contact [facility] at [phone] with questions or concerns regarding your invoice.   IF you received labwork today, you will receive an invoice from [facility]. Please contact [facility] at [phone] with questions or concerns regarding your invoice.   Our billing staff will not be able to assist you with questions regarding bills from these companies.  You will be contacted with the lab results as soon as they are available. The fastest way to get your results is to activate your My Chart account. Instructions are located on the last page of this paperwork. If you have not heard from [place] regarding the results in 2 weeks, please contact this office.

## 2017-10-29 ENCOUNTER — Encounter: Payer: Self-pay | Admitting: Physician Assistant

## 2017-10-29 ENCOUNTER — Telehealth: Payer: Self-pay | Admitting: Physician Assistant

## 2017-10-29 NOTE — Telephone Encounter (Signed)
Patient called to request that her medication FLUoxetine (PROZAC) 20 MG tablet be sent in capsule form.  She stated that the tablet cost $99 which she cannot afford.  She is in need of this medication as soon as possible.  CB# [phone].

## 2017-10-29 NOTE — Telephone Encounter (Signed)
Per note, pt needs capsule, not a tablet.  See message Sent to [place].

## 2017-10-29 NOTE — Telephone Encounter (Unsigned)
Copied from [place] [id number]. Topic: Quick Communication - Rx Refill/Question >> Oct 29, 2017 10:32 AM Judyann Munson wrote: Medication: FLUoxetine (PROZAC) 20 MG tablet   Has the patient contacted their pharmacy? no  Preferred Pharmacy (with phone number or street name): [id number] - [place], [place] [phone] (Phone) [phone] (Fax)      Patient called in a request the medication needs to be a capsule and not a tablet. Her insurance wont cover the tablet that was sent over. Please advise

## 2017-10-30 ENCOUNTER — Other Ambulatory Visit: Payer: Self-pay | Admitting: *Deleted

## 2017-10-30 ENCOUNTER — Other Ambulatory Visit: Payer: Self-pay | Admitting: Physician Assistant

## 2017-10-30 DIAGNOSIS — C562 Malignant neoplasm of left ovary: Secondary | ICD-10-CM | POA: Diagnosis not present

## 2017-10-30 DIAGNOSIS — I2510 Atherosclerotic heart disease of native coronary artery without angina pectoris: Secondary | ICD-10-CM | POA: Diagnosis not present

## 2017-10-30 DIAGNOSIS — C189 Malignant neoplasm of colon, unspecified: Secondary | ICD-10-CM | POA: Diagnosis not present

## 2017-10-30 DIAGNOSIS — I429 Cardiomyopathy, unspecified: Secondary | ICD-10-CM | POA: Diagnosis not present

## 2017-10-30 DIAGNOSIS — C786 Secondary malignant neoplasm of retroperitoneum and peritoneum: Secondary | ICD-10-CM | POA: Diagnosis not present

## 2017-10-30 DIAGNOSIS — K56699 Other intestinal obstruction unspecified as to partial versus complete obstruction: Secondary | ICD-10-CM | POA: Diagnosis not present

## 2017-10-30 MED ORDER — FLUOXETINE HCL 20 MG PO CAPS: 20.0000 mg | ORAL_CAPSULE | Freq: Every day | ORAL | 1 refills | Status: DC

## 2017-10-30 NOTE — Progress Notes (Signed)
Please call pt and let her know I have sent in capsule form of prozac. Thanks!  Meds ordered this encounter  Medications  . FLUoxetine (PROZAC) 20 MG capsule    Sig: Take 1 capsule (20 mg total) by mouth daily.    Dispense:  90 capsule    Refill:  1    Order Specific Question:   Supervising Provider    Answer:   Wardell Honour [[id number]]

## 2017-10-31 ENCOUNTER — Inpatient Hospital Stay (HOSPITAL_BASED_OUTPATIENT_CLINIC_OR_DEPARTMENT_OTHER): Payer: Medicare Other | Admitting: Hematology and Oncology

## 2017-10-31 ENCOUNTER — Encounter: Payer: Self-pay | Admitting: Hematology and Oncology

## 2017-10-31 ENCOUNTER — Telehealth: Payer: Self-pay

## 2017-10-31 ENCOUNTER — Inpatient Hospital Stay: Payer: Medicare Other

## 2017-10-31 ENCOUNTER — Inpatient Hospital Stay: Payer: Medicare Other | Attending: Gynecologic Oncology

## 2017-10-31 DIAGNOSIS — C562 Malignant neoplasm of left ovary: Secondary | ICD-10-CM | POA: Diagnosis not present

## 2017-10-31 DIAGNOSIS — G893 Neoplasm related pain (acute) (chronic): Secondary | ICD-10-CM

## 2017-10-31 DIAGNOSIS — Z5111 Encounter for antineoplastic chemotherapy: Secondary | ICD-10-CM | POA: Diagnosis not present

## 2017-10-31 DIAGNOSIS — Z7189 Other specified counseling: Secondary | ICD-10-CM

## 2017-10-31 DIAGNOSIS — G629 Polyneuropathy, unspecified: Secondary | ICD-10-CM | POA: Insufficient documentation

## 2017-10-31 DIAGNOSIS — K56609 Unspecified intestinal obstruction, unspecified as to partial versus complete obstruction: Secondary | ICD-10-CM | POA: Diagnosis not present

## 2017-10-31 DIAGNOSIS — Z79899 Other long term (current) drug therapy: Secondary | ICD-10-CM | POA: Insufficient documentation

## 2017-10-31 DIAGNOSIS — T451X5A Adverse effect of antineoplastic and immunosuppressive drugs, initial encounter: Secondary | ICD-10-CM

## 2017-10-31 DIAGNOSIS — G620 Drug-induced polyneuropathy: Secondary | ICD-10-CM

## 2017-10-31 DIAGNOSIS — K5909 Other constipation: Secondary | ICD-10-CM

## 2017-10-31 LAB — CMP (CANCER CENTER ONLY)
ALBUMIN: 3.8 g/dL (ref 3.5–5.0)
ALK PHOS: 97 U/L (ref 38–126)
ALT: 13 U/L (ref 0–44)
AST: 11 U/L — AB (ref 15–41)
Anion gap: 9 (ref 5–15)
BILIRUBIN TOTAL: 0.2 mg/dL — AB (ref 0.3–1.2)
BUN: 20 mg/dL (ref 8–23)
CO2: 27 mmol/L (ref 22–32)
CREATININE: 0.75 mg/dL (ref 0.44–1.00)
Calcium: 9.8 mg/dL (ref 8.9–10.3)
Chloride: 105 mmol/L (ref 98–111)
GFR, Est AFR Am: >60 mL/min (ref >60)
GFR, Estimated: >60 mL/min (ref >60)
GLUCOSE: 134 mg/dL — AB (ref 70–99)
POTASSIUM: 3.6 mmol/L (ref 3.5–5.1)
Sodium: 141 mmol/L (ref 135–145)
TOTAL PROTEIN: 7.1 g/dL (ref 6.5–8.1)

## 2017-10-31 LAB — CBC WITH DIFFERENTIAL (CANCER CENTER ONLY)
BASOS ABS: 0 10*3/uL (ref 0.0–0.1)
BASOS PCT: 0 %
Eosinophils Absolute: 0 10*3/uL (ref 0.0–0.5)
Eosinophils Relative: 0 %
HEMATOCRIT: 39.4 % (ref 34.8–46.6)
HEMOGLOBIN: 13.4 g/dL (ref 11.6–15.9)
LYMPHS PCT: 8 %
Lymphs Abs: 0.5 10*3/uL — ABNORMAL LOW (ref 0.9–3.3)
MCH: 34.4 pg — ABNORMAL HIGH (ref 25.1–34.0)
MCHC: 34.2 g/dL (ref 31.5–36.0)
MCV: 100.8 fL (ref 79.5–101.0)
Monocytes Absolute: 0.2 10*3/uL (ref 0.1–0.9)
Monocytes Relative: 4 %
Neutro Abs: 5.2 10*3/uL (ref 1.5–6.5)
Neutrophils Relative %: 88 %
Platelet Count: 158 10*3/uL (ref 145–400)
RBC: 3.91 MIL/uL (ref 3.70–5.45)
RDW: 15.3 % — AB (ref 11.2–14.5)
WBC Count: 6 10*3/uL (ref 3.9–10.3)

## 2017-10-31 MED ORDER — HEPARIN SOD (PORK) LOCK FLUSH 100 UNIT/ML IV SOLN
250.0000 [IU] | Freq: Once | INTRAVENOUS | Status: AC | PRN
  Administered 2017-10-31: 250 [IU]
  Filled 2017-10-31: qty 5

## 2017-10-31 MED ORDER — SODIUM CHLORIDE 0.9 % IV SOLN
140.0000 mg/m2 | Freq: Once | INTRAVENOUS | Status: AC
  Administered 2017-10-31: 216 mg via INTRAVENOUS
  Filled 2017-10-31: qty 36

## 2017-10-31 MED ORDER — FAMOTIDINE IN NACL 20-0.9 MG/50ML-% IV SOLN
20.0000 mg | Freq: Once | INTRAVENOUS | Status: AC
  Administered 2017-10-31: 20 mg via INTRAVENOUS

## 2017-10-31 MED ORDER — SODIUM CHLORIDE 0.9 % IV SOLN
Freq: Once | INTRAVENOUS | Status: AC
  Administered 2017-10-31: 11:00:00 via INTRAVENOUS
  Filled 2017-10-31: qty 5

## 2017-10-31 MED ORDER — DIPHENHYDRAMINE HCL 50 MG/ML IJ SOLN
INTRAMUSCULAR | Status: AC
  Filled 2017-10-31: qty 1

## 2017-10-31 MED ORDER — PALONOSETRON HCL INJECTION 0.25 MG/5ML
INTRAVENOUS | Status: AC
  Filled 2017-10-31: qty 5

## 2017-10-31 MED ORDER — MORPHINE SULFATE 15 MG PO TABS: 15.0000 mg | ORAL_TABLET | ORAL | 0 refills | Status: DC | PRN

## 2017-10-31 MED ORDER — SODIUM CHLORIDE 0.9 % IV SOLN
415.8000 mg | Freq: Once | INTRAVENOUS | Status: AC
  Administered 2017-10-31: 420 mg via INTRAVENOUS
  Filled 2017-10-31: qty 42

## 2017-10-31 MED ORDER — FAMOTIDINE IN NACL 20-0.9 MG/50ML-% IV SOLN
INTRAVENOUS | Status: AC
  Filled 2017-10-31: qty 50

## 2017-10-31 MED ORDER — SODIUM CHLORIDE 0.9% FLUSH
10.0000 mL | INTRAVENOUS | Status: DC | PRN
  Administered 2017-10-31: 10 mL
  Filled 2017-10-31: qty 10

## 2017-10-31 MED ORDER — DIPHENHYDRAMINE HCL 50 MG/ML IJ SOLN
50.0000 mg | Freq: Once | INTRAMUSCULAR | Status: AC
  Administered 2017-10-31: 50 mg via INTRAVENOUS

## 2017-10-31 MED ORDER — SODIUM CHLORIDE 0.9 % IV SOLN
Freq: Once | INTRAVENOUS | Status: AC
  Administered 2017-10-31: 11:00:00 via INTRAVENOUS

## 2017-10-31 MED ORDER — PALONOSETRON HCL INJECTION 0.25 MG/5ML
0.2500 mg | Freq: Once | INTRAVENOUS | Status: AC
  Administered 2017-10-31: 0.25 mg via INTRAVENOUS

## 2017-10-31 MED ORDER — PROCHLORPERAZINE MALEATE 10 MG PO TABS: 10.0000 mg | ORAL_TABLET | Freq: Four times a day (QID) | ORAL | 1 refills | Status: AC | PRN

## 2017-10-31 NOTE — Telephone Encounter (Signed)
Spoke with Debbie at [facility] to request nurse visit on Sundays or Mondays for IVFs in addition to Thursday visits for PICC dressing changes.

## 2017-10-31 NOTE — Progress Notes (Signed)
[place] OFFICE PROGRESS NOTE  Patient Care Team: Donzetta Kohut as PCP - General (Physician Assistant) [name], MD as Consulting Physician (Obstetrics and Gynecology) Mauri Pole, MD as Consulting Physician (Gastroenterology) Belva Crome, MD as Consulting Physician (Cardiology)  ASSESSMENT & PLAN:  Disseminated ovarian cancer, left [facility]) She tolerated cycle 1 of treatment poorly due to intermittent abdominal pain and dehydration I recommend advanced home care agency to assess her at the end of the week if possible to provide supportive care at home We will proceed with treatment without dose adjustment And recommend minimum 3 cycles of chemotherapy before repeat imaging study  Cancer related pain She has minimum pain She will take morphine sulfate as needed  Peripheral neuropathy due to chemotherapy [facility]) She has minimum peripheral neuropathy from before I plan to continue on similar dosing as before  Other constipation She has mild intermittent constipation from previous treatment She will continue laxative therapy   No orders of the defined types were placed in this encounter.   INTERVAL HISTORY: Please see below for problem oriented charting. She returns for cycle 2 of chemotherapy With cycle 1, she developed severe abdominal pain that lasted 3 to 4 days associated with constipation Since then, the pain resolves She denies nausea or vomiting She is able to maintain her weight She denies peripheral neuropathy She denies recent infection  SUMMARY OF ONCOLOGIC HISTORY: Oncology History   MSI stable     Disseminated ovarian cancer, left ([place])   12/17/2016 Imaging    US pelvis 1. There appears to be a 2.2 x 1.9 x 2.0 cm mass centered in the cervix, likely extending into the lower uterus. Recommend correlation with physical exam. An MRI could better evaluate. 2. Thickened endometrium measuring 12 mm. Recommend gynecologic  consultation and endometrial sampling given patient age. 3. Probable fibroid in the uterus.      12/27/2016 Pathology Results    Endocervix, curettage - BENIGN ENDOCERVICAL MUCOSA AND BENIGN SQUAMOUS MUCOSA      01/01/2017 Imaging    MRI pelvis 1. No classic intermediate to high signal intensity tissue disrupting the hypointense fibrous cervical stroma as typically seen in the setting of cervical carcinoma. However, there is asymmetric thickening of the posterior cervix with anterior displacement of the cervical canal and associated widening and heterogeneity of the endometrial cavity. As such, imaging features are indeterminate by MRI and close follow-up or tissue sampling is recommended. 2. Apparent areas of tethering/scarring between the sigmoid colon posterior uterus/cervix. Diverticuli are noted in the sigmoid colon and this may be related to scarring from prior bouts is diverticulitis. 3. Small volume intraperitoneal free fluid.      01/28/2017 Pathology Results    1. Cervix, biopsy, posterior - SPINDLE CELL LESION CONSISTENT WITH SMOOTH MUSCLE NEOPLASM, SEE COMMENT. 2. Endometrium, curettage - BENIGN SQUAMOUS MUCOSA. - BENIGN SMOOTH MUSCLE. - NO ENDOMETRIUM PRESENT. Microscopic Comment 1. There is a spindle cell lesion with focal atypia. There is no necrosis or increase in mitotic figures. The sample is somewhat limited, but the differential includes a cellular leiomyoma.      01/28/2017 Surgery    Surgery: ultrasound guided dilation of cervix with D&C and cervical biopsy  Surgeons:  Donaciano Eva, MD  Operative findings: 6cm uterus, dilated endometrial cavity, very stenotic cervical os. Thickened, fibrotic cervix without discrete mass, grossly normal cervical mucosa.         03/22/2017 Imaging    CT abdomen and pelvis 1. Diffuse  prominent fluid-filled small bowel with mesenteric edema, enteritis pattern. This may be infectious or inflammatory. There is no evidence  of obstruction. 2. Mild omental stranding in the left abdomen likely reactive secondary to primary bowel process, omental caking is not entirely excluded. 3. Sigmoid colonic diverticulosis without diverticulitis. 4. Prominent heterogeneous endometrium, recommend correlation with recent D and C. 5.  Aortic Atherosclerosis (ICD10-I70.0).      06/26/2017 Imaging    US pelvis T/V images. Anteverted and heterogenous uterus measuring 5.85 x 3.87 x 3.36 cm. Right subserous fibroid with calcifications measuring 2.7 x 2.6 cm. Endometrial line appears thickened at 8.1 mm. Cervix appears normal, no mass seen. Right ovary not seen. Left adnexal mass cystic/solid measuring 7.6 x 6.0 x 7.1 cm. Positive color flow Doppler.No free fluid in the posterior cul-de-sac.      06/26/2017 Tumor Marker    Patient's tumor was tested for the following markers: CA-125 Results of the tumor marker test revealed 90      06/26/2017 Tumor Marker    Patient's tumor was tested for the following markers: CEA Results of the tumor marker test revealed 4.5      06/27/2017 Imaging    Ct abdomen and pelvis New 7.4 cm left adnexal mass with worsening abdominal ascites and omental nodularity, suggesting metastatic ovarian carcinoma.      07/02/2017 Pathology Results    Cervix, biopsy, ectocervix - SQUAMOUS MUCOSA WITH ATYPICAL INFILTRATE WITHIN FIBROMUSCULAR STROMA. - SEE MICROSCOPIC DESCRIPTION. Microscopic Comment There is a small fragment of squamous mucosa with a small portion of benign squamous epithelium. Within the fibromuscular stroma there are strands and nests of epithelioid cells, which have an infiltrating pattern and there are a few with cytoplasmic vacuoles. The findings are worrisome for an infiltrating neoplastic process and additional tissue may be indicated. There is insufficient tissue remaining in the current biopsy for additional studies.      07/11/2017 Procedure    CT-guided biopsy of the peritoneal  thickening in the left lateral abdomen. Procedure was technically challenging due to the small amount of peritoneal thickening and close proximity of multiple bowel loops. False negative biopsy is possible.      07/11/2017 Pathology Results    Soft Tissue Needle Core Biopsy, omentum - ADENOCARCINOMA. - SEE COMMENT. Microscopic Comment The malignant cells are positive for CDX-2, cytokeratin 20, and p53. They are negative for cytokeratin 7, estrogen receptor, and PAX-8. This immunohistochemical profile argues against a gynecologic primary, and the differential diagnosis includes gastrointestinal primary.      07/12/2017 Imaging    Ct abdomen and pelvis 1. New small bowel obstruction with transition point seen in the right lower quadrant, possibly secondary to adhesions related to peritoneal carcinomatosis. No pneumatosis. 2. Findings suggestive of metastatic ovarian cancer. Slightly worsened abdominal ascites may be related to metastatic disease or reactive to the small-bowel obstruction. 3. Expected post biopsy changes in the left lower quadrant adjacent to peritoneal nodularity. No intra-abdominal hemorrhage.      07/12/2017 - 07/14/2017 Hospital Admission    She was admitted for management of subacute bowel obstruction      07/15/2017 Cancer Staging    Staging form: Ovary, Fallopian Tube, and Primary Peritoneal Carcinoma, AJCC 8th Edition - Clinical: cT3, cN0, cM0 - Signed by Heath Lark, MD on 10/09/2017      07/18/2017 Procedure    Successful placement of a right arm PICC with sonographic and fluoroscopic guidance. The catheter is ready for use.      08/04/2017 Imaging  The previously noted ascites in the abdomen and pelvis has resolved. 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  No small bowel or colonic obstruction is identified.  Generalized thickened bowel wall of the sigmoid colon.  Mixed cystic and solid mass of the left ovary is  unchanged.  Status post prior cholecystectomy with mild postsurgical intrahepatic biliary ductal dilatation.       09/29/2017 Imaging    1. Interval resolution of small bowel obstruction. 2. Persistent findings of peritoneal carcinomatosis with improvement in ascites and probable improvement in the previously demonstrated ill-defined right lower quadrant mass. 3. Grossly stable complex left adnexal mass from recent prior studies. This was first demonstrated on the exam done 06/27/2017 and has enlarged since then, consistent with an ovarian metastasis. This abuts the sigmoid colon and may invade it. 4. Bladder wall thickening and surrounding soft tissue stranding consistent with cystitis, possibly treatment related.       10/06/2017 Imaging    Incomplete barium enema due to severe, near complete obstruction of an irregular process in the sigmoid colon. The irregularity of this lesion is most concerning for colon cancer.      10/09/2017 Tumor Marker    Patient's tumor was tested for the following markers: CA-125 Results of the tumor marker test revealed 29.4      10/10/2017 -  Chemotherapy    The patient had carboplatin and Taxol       REVIEW OF SYSTEMS:   Constitutional: Denies fevers, chills or abnormal weight loss Eyes: Denies blurriness of vision Ears, nose, mouth, throat, and face: Denies mucositis or sore throat Respiratory: Denies cough, dyspnea or wheezes Cardiovascular: Denies palpitation, chest discomfort or lower extremity swelling Skin: Denies abnormal skin rashes Lymphatics: Denies new lymphadenopathy or easy bruising Neurological:Denies numbness, tingling or new weaknesses Behavioral/Psych: Mood is stable, no new changes  All other systems were reviewed with the patient and are negative.  I have reviewed the past medical history, past surgical history, social history and family history with the patient and they are unchanged from previous note.  ALLERGIES:  is  allergic to flagyl [metronidazole].  MEDICATIONS:  Current Outpatient Medications  Medication Sig Dispense Refill  . aspirin EC 81 MG tablet Take 81 mg by mouth daily.    Marland Kitchen FLUoxetine (PROZAC) 20 MG capsule Take 1 capsule (20 mg total) by mouth daily. 90 capsule 1  . LORazepam (ATIVAN) 1 MG tablet Take 1 tablet (1 mg total) by mouth every 6 (six) hours as needed. 60 tablet 0  . morphine (MSIR) 15 MG tablet Take 1 tablet (15 mg total) by mouth every 4 (four) hours as needed for severe pain. 60 tablet 0  . ondansetron (ZOFRAN) 8 MG tablet Take 1 tablet (8 mg total) by mouth every 8 (eight) hours as needed for nausea. 30 tablet 3  . prochlorperazine (COMPAZINE) 10 MG tablet Take 1 tablet (10 mg total) by mouth every 6 (six) hours as needed for nausea or vomiting. 30 tablet 1  . sennosides-docusate sodium (SENOKOT-S) 8.6-50 MG tablet Take 2-4 tablets by mouth 2 (two) times daily.    . traZODone (DESYREL) 50 MG tablet Take 1 tablet (50 mg total) by mouth at bedtime as needed for sleep. 30 tablet 2   Current Facility-Administered Medications  Medication Dose Route Frequency Provider Last Rate Last Dose  . 0.9 %  sodium chloride infusion  500 mL Intravenous Once Nandigam, Venia Minks, MD       Facility-Administered Medications Ordered in Other  Visits  Medication Dose Route Frequency Provider Last Rate Last Dose  . CARBOplatin (PARAPLATIN) 420 mg in sodium chloride 0.9 % 250 mL chemo infusion  420 mg Intravenous Once Alvy Bimler, Valisa Karpel, MD      . famotidine (PEPCID) IVPB 20 mg premix  20 mg Intravenous Once Alvy Bimler, Kenyon Eichelberger, MD 200 mL/hr at 10/31/17 1148 20 mg at 10/31/17 1148  . heparin lock flush 100 unit/mL  250 Units Intracatheter Once PRN Alvy Bimler, Tomasita Beevers, MD      . PACLitaxel (TAXOL) 216 mg in sodium chloride 0.9 % 250 mL chemo infusion (> 18m/m2)  140 mg/m2 (Treatment Plan Recorded) Intravenous Once Devron Cohick, MD      . sodium chloride flush (NS) 0.9 % injection 10 mL  10 mL Intracatheter PRN GAlvy Bimler Cristofher Livecchi, MD         PHYSICAL EXAMINATION: ECOG PERFORMANCE STATUS: 1 - Symptomatic but completely ambulatory  Vitals:   10/31/17 0949  BP: 121/66  Pulse: 82  Resp: 18  Temp: 98.8 F (37.1 C)  SpO2: 98%   Filed Weights   10/31/17 0949  Weight: 113 lb 4.8 oz (51.4 kg)    GENERAL:alert, no distress and comfortable SKIN: skin color, texture, turgor are normal, no rashes or significant lesions EYES: normal, Conjunctiva are pink and non-injected, sclera clear OROPHARYNX:no exudate, no erythema and lips, buccal mucosa, and tongue normal  NECK: supple, thyroid normal size, non-tender, without nodularity LYMPH:  no palpable lymphadenopathy in the cervical, axillary or inguinal LUNGS: clear to auscultation and percussion with normal breathing effort HEART: regular rate & rhythm and no murmurs and no lower extremity edema ABDOMEN:abdomen soft, non-tender and normal bowel sounds Musculoskeletal:no cyanosis of digits and no clubbing  NEURO: alert & oriented x 3 with fluent speech, no focal motor/sensory deficits  LABORATORY DATA:  I have reviewed the data as listed    Component Value Date/Time   NA 141 10/31/2017 0930   NA 143 09/26/2017   K 3.6 10/31/2017 0930   CL 105 10/31/2017 0930   CO2 27 10/31/2017 0930   GLUCOSE 134 (H) 10/31/2017 0930   BUN 20 10/31/2017 0930   BUN 20 09/26/2017   CREATININE 0.75 10/31/2017 0930   CALCIUM 9.8 10/31/2017 0930   PROT 7.1 10/31/2017 0930   PROT 6.7 11/28/2016 0849   ALBUMIN 3.8 10/31/2017 0930   ALBUMIN 4.3 11/28/2016 0849   AST 11 (L) 10/31/2017 0930   ALT 13 10/31/2017 0930   ALKPHOS 97 10/31/2017 0930   BILITOT 0.2 (L) 10/31/2017 0930   GFRNONAA >60 10/31/2017 0930   GFRAA >60 10/31/2017 0930    No results found for: SPEP, UPEP  Lab Results  Component Value Date   WBC 6.0 10/31/2017   NEUTROABS 5.2 10/31/2017   HGB 13.4 10/31/2017   HCT 39.4 10/31/2017   MCV 100.8 10/31/2017   PLT 158 10/31/2017      Chemistry      Component  Value Date/Time   NA 141 10/31/2017 0930   NA 143 09/26/2017   K 3.6 10/31/2017 0930   CL 105 10/31/2017 0930   CO2 27 10/31/2017 0930   BUN 20 10/31/2017 0930   BUN 20 09/26/2017   CREATININE 0.75 10/31/2017 0930   GLU 92 09/26/2017      Component Value Date/Time   CALCIUM 9.8 10/31/2017 0930   ALKPHOS 97 10/31/2017 0930   AST 11 (L) 10/31/2017 0930   ALT 13 10/31/2017 0930   BILITOT 0.2 (L) 10/31/2017 0930  RADIOGRAPHIC STUDIES: I have personally reviewed the radiological images as listed and agreed with the findings in the report. Dg Colon W/cm - Wo/w Kub  Result Date: 10/06/2017 CLINICAL DATA:  Incomplete colonoscopy. Unable to pass colonoscope. Peritoneal metastatic disease with carcinomatosis. EXAM: BE WITH CONTRAST - WITHOUT AND WITH KUB FLUOROSCOPY TIME:  Fluoroscopy Time:  2 minutes 42 seconds Radiation Exposure Index (if provided by the fluoroscopic device): Number of Acquired Spot Images: 0 COMPARISON:  CT 09/29/2017 FINDINGS: Scout film shows gas throughout the colon. No free air. Prior cholecystectomy. There is near complete obstruction in the sigmoid colon region. There is a moderate length segment of marked narrowing and irregularity noted. Only a small amount of contrast passes through this area. Contrast could only be advanced to approximately the splenic flexure. Study is suboptimal and incomplete due to inability to pass significant amount of contrast through the sigmoid colon constricting lesion. IMPRESSION: Incomplete barium enema due to severe, near complete obstruction of an irregular process in the sigmoid colon. The irregularity of this lesion is most concerning for colon cancer. Electronically Signed   By: Rolm Baptise M.D.   On: 10/06/2017 16:33    All questions were answered. The patient knows to call the clinic with any problems, questions or concerns. No barriers to learning was detected.  I spent 15 minutes counseling the patient face to face. The  total time spent in the appointment was 20 minutes and more than 50% was on counseling and review of test results  Heath Lark, MD 10/31/2017 11:54 AM

## 2017-10-31 NOTE — Assessment & Plan Note (Signed)
She tolerated cycle 1 of treatment poorly due to intermittent abdominal pain and dehydration I recommend advanced home care agency to assess her at the end of the week if possible to provide supportive care at home We will proceed with treatment without dose adjustment And recommend minimum 3 cycles of chemotherapy before repeat imaging study

## 2017-10-31 NOTE — Patient Instructions (Signed)
[facility] Discharge Instructions for Patients Receiving Chemotherapy  Today you received the following chemotherapy agents:  Taxol, Carboplatin  To help prevent nausea and vomiting after your treatment, we encourage you to take your nausea medication as prescribed.   If you develop nausea and vomiting that is not controlled by your nausea medication, call the clinic.   BELOW ARE SYMPTOMS THAT SHOULD BE REPORTED IMMEDIATELY:  *FEVER GREATER THAN 100.5 F  *CHILLS WITH OR WITHOUT FEVER  NAUSEA AND VOMITING THAT IS NOT CONTROLLED WITH YOUR NAUSEA MEDICATION  *UNUSUAL SHORTNESS OF BREATH  *UNUSUAL BRUISING OR BLEEDING  TENDERNESS IN MOUTH AND THROAT WITH OR WITHOUT PRESENCE OF ULCERS  *URINARY PROBLEMS  *BOWEL PROBLEMS  UNUSUAL RASH Items with * indicate a potential emergency and should be followed up as soon as possible.  Feel free to call the clinic should you have any questions or concerns. The clinic phone number is [phone].  Please show the CHEMO ALERT CARD at check-in to the Emergency Department and triage nurse.   

## 2017-10-31 NOTE — Assessment & Plan Note (Signed)
She has minimum peripheral neuropathy from before I plan to continue on similar dosing as before

## 2017-10-31 NOTE — Assessment & Plan Note (Signed)
She has minimum pain She will take morphine sulfate as needed

## 2017-10-31 NOTE — Assessment & Plan Note (Signed)
She has mild intermittent constipation from previous treatment She will continue laxative therapy

## 2017-10-31 NOTE — Progress Notes (Signed)
Spoke to pt this morning, she picked up her medication yesterday.

## 2017-10-31 NOTE — Telephone Encounter (Signed)
Spoken to pt, this matter has already been addressed

## 2017-11-01 LAB — CA 125: Cancer Antigen (CA) 125: 27.2 U/mL (ref 0.0–38.1)

## 2017-11-03 DIAGNOSIS — C786 Secondary malignant neoplasm of retroperitoneum and peritoneum: Secondary | ICD-10-CM | POA: Diagnosis not present

## 2017-11-03 DIAGNOSIS — I2510 Atherosclerotic heart disease of native coronary artery without angina pectoris: Secondary | ICD-10-CM | POA: Diagnosis not present

## 2017-11-03 DIAGNOSIS — C562 Malignant neoplasm of left ovary: Secondary | ICD-10-CM | POA: Diagnosis not present

## 2017-11-03 DIAGNOSIS — K56699 Other intestinal obstruction unspecified as to partial versus complete obstruction: Secondary | ICD-10-CM | POA: Diagnosis not present

## 2017-11-03 DIAGNOSIS — I429 Cardiomyopathy, unspecified: Secondary | ICD-10-CM | POA: Diagnosis not present

## 2017-11-03 DIAGNOSIS — C189 Malignant neoplasm of colon, unspecified: Secondary | ICD-10-CM | POA: Diagnosis not present

## 2017-11-06 DIAGNOSIS — C786 Secondary malignant neoplasm of retroperitoneum and peritoneum: Secondary | ICD-10-CM | POA: Diagnosis not present

## 2017-11-06 DIAGNOSIS — I2510 Atherosclerotic heart disease of native coronary artery without angina pectoris: Secondary | ICD-10-CM | POA: Diagnosis not present

## 2017-11-06 DIAGNOSIS — C189 Malignant neoplasm of colon, unspecified: Secondary | ICD-10-CM | POA: Diagnosis not present

## 2017-11-06 DIAGNOSIS — I429 Cardiomyopathy, unspecified: Secondary | ICD-10-CM | POA: Diagnosis not present

## 2017-11-06 DIAGNOSIS — C562 Malignant neoplasm of left ovary: Secondary | ICD-10-CM | POA: Diagnosis not present

## 2017-11-06 DIAGNOSIS — K56699 Other intestinal obstruction unspecified as to partial versus complete obstruction: Secondary | ICD-10-CM | POA: Diagnosis not present

## 2017-11-13 DIAGNOSIS — C189 Malignant neoplasm of colon, unspecified: Secondary | ICD-10-CM | POA: Diagnosis not present

## 2017-11-13 DIAGNOSIS — I2510 Atherosclerotic heart disease of native coronary artery without angina pectoris: Secondary | ICD-10-CM | POA: Diagnosis not present

## 2017-11-13 DIAGNOSIS — C562 Malignant neoplasm of left ovary: Secondary | ICD-10-CM | POA: Diagnosis not present

## 2017-11-13 DIAGNOSIS — I429 Cardiomyopathy, unspecified: Secondary | ICD-10-CM | POA: Diagnosis not present

## 2017-11-13 DIAGNOSIS — K56699 Other intestinal obstruction unspecified as to partial versus complete obstruction: Secondary | ICD-10-CM | POA: Diagnosis not present

## 2017-11-13 DIAGNOSIS — C786 Secondary malignant neoplasm of retroperitoneum and peritoneum: Secondary | ICD-10-CM | POA: Diagnosis not present

## 2017-11-20 DIAGNOSIS — I429 Cardiomyopathy, unspecified: Secondary | ICD-10-CM | POA: Diagnosis not present

## 2017-11-20 DIAGNOSIS — C562 Malignant neoplasm of left ovary: Secondary | ICD-10-CM | POA: Diagnosis not present

## 2017-11-20 DIAGNOSIS — C189 Malignant neoplasm of colon, unspecified: Secondary | ICD-10-CM | POA: Diagnosis not present

## 2017-11-20 DIAGNOSIS — C786 Secondary malignant neoplasm of retroperitoneum and peritoneum: Secondary | ICD-10-CM | POA: Diagnosis not present

## 2017-11-20 DIAGNOSIS — K56699 Other intestinal obstruction unspecified as to partial versus complete obstruction: Secondary | ICD-10-CM | POA: Diagnosis not present

## 2017-11-20 DIAGNOSIS — I2510 Atherosclerotic heart disease of native coronary artery without angina pectoris: Secondary | ICD-10-CM | POA: Diagnosis not present

## 2017-11-21 ENCOUNTER — Telehealth: Payer: Self-pay | Admitting: Hematology and Oncology

## 2017-11-21 ENCOUNTER — Inpatient Hospital Stay (HOSPITAL_BASED_OUTPATIENT_CLINIC_OR_DEPARTMENT_OTHER): Payer: Medicare Other | Admitting: Hematology and Oncology

## 2017-11-21 ENCOUNTER — Encounter: Payer: Self-pay | Admitting: Hematology and Oncology

## 2017-11-21 ENCOUNTER — Inpatient Hospital Stay: Payer: Medicare Other | Attending: Gynecologic Oncology

## 2017-11-21 ENCOUNTER — Inpatient Hospital Stay: Payer: Medicare Other

## 2017-11-21 VITALS — BP 106/71 | HR 87 | Temp 97.8°F | Resp 18 | Ht 64.0 in | Wt 111.4 lb

## 2017-11-21 DIAGNOSIS — C786 Secondary malignant neoplasm of retroperitoneum and peritoneum: Secondary | ICD-10-CM

## 2017-11-21 DIAGNOSIS — G4700 Insomnia, unspecified: Secondary | ICD-10-CM | POA: Insufficient documentation

## 2017-11-21 DIAGNOSIS — C562 Malignant neoplasm of left ovary: Secondary | ICD-10-CM

## 2017-11-21 DIAGNOSIS — R42 Dizziness and giddiness: Secondary | ICD-10-CM | POA: Insufficient documentation

## 2017-11-21 DIAGNOSIS — G620 Drug-induced polyneuropathy: Secondary | ICD-10-CM | POA: Insufficient documentation

## 2017-11-21 DIAGNOSIS — R110 Nausea: Secondary | ICD-10-CM | POA: Insufficient documentation

## 2017-11-21 DIAGNOSIS — T451X5A Adverse effect of antineoplastic and immunosuppressive drugs, initial encounter: Secondary | ICD-10-CM

## 2017-11-21 DIAGNOSIS — Z7189 Other specified counseling: Secondary | ICD-10-CM

## 2017-11-21 DIAGNOSIS — K5909 Other constipation: Secondary | ICD-10-CM | POA: Diagnosis not present

## 2017-11-21 DIAGNOSIS — Z5111 Encounter for antineoplastic chemotherapy: Secondary | ICD-10-CM | POA: Diagnosis not present

## 2017-11-21 DIAGNOSIS — G4709 Other insomnia: Secondary | ICD-10-CM

## 2017-11-21 DIAGNOSIS — C801 Malignant (primary) neoplasm, unspecified: Secondary | ICD-10-CM

## 2017-11-21 LAB — CBC WITH DIFFERENTIAL (CANCER CENTER ONLY)
Basophils Absolute: 0 10*3/uL (ref 0.0–0.1)
Basophils Relative: 0 %
EOS PCT: 0 %
Eosinophils Absolute: 0 10*3/uL (ref 0.0–0.5)
HEMATOCRIT: 40.2 % (ref 34.8–46.6)
Hemoglobin: 13.7 g/dL (ref 11.6–15.9)
LYMPHS ABS: 0.6 10*3/uL — AB (ref 0.9–3.3)
LYMPHS PCT: 11 %
MCH: 34.2 pg — AB (ref 25.1–34.0)
MCHC: 34.1 g/dL (ref 31.5–36.0)
MCV: 100.2 fL (ref 79.5–101.0)
MONO ABS: 0.2 10*3/uL (ref 0.1–0.9)
Monocytes Relative: 4 %
NEUTROS ABS: 4.2 10*3/uL (ref 1.5–6.5)
Neutrophils Relative %: 85 %
Platelet Count: 150 10*3/uL (ref 145–400)
RBC: 4.01 MIL/uL (ref 3.70–5.45)
RDW: 14.1 % (ref 11.2–14.5)
WBC Count: 5 10*3/uL (ref 3.9–10.3)

## 2017-11-21 LAB — CMP (CANCER CENTER ONLY)
ALT: 10 U/L (ref 0–44)
AST: 10 U/L — ABNORMAL LOW (ref 15–41)
Albumin: 3.8 g/dL (ref 3.5–5.0)
Alkaline Phosphatase: 99 U/L (ref 38–126)
Anion gap: 11 (ref 5–15)
BILIRUBIN TOTAL: 0.3 mg/dL (ref 0.3–1.2)
BUN: 15 mg/dL (ref 8–23)
CO2: 26 mmol/L (ref 22–32)
Calcium: 9.5 mg/dL (ref 8.9–10.3)
Chloride: 102 mmol/L (ref 98–111)
Creatinine: 0.81 mg/dL (ref 0.44–1.00)
Glucose, Bld: 208 mg/dL — ABNORMAL HIGH (ref 70–99)
POTASSIUM: 3.9 mmol/L (ref 3.5–5.1)
Sodium: 139 mmol/L (ref 135–145)
Total Protein: 7.2 g/dL (ref 6.5–8.1)

## 2017-11-21 MED ORDER — SODIUM CHLORIDE 0.9 % IV SOLN
415.8000 mg | Freq: Once | INTRAVENOUS | Status: AC
  Administered 2017-11-21: 420 mg via INTRAVENOUS
  Filled 2017-11-21: qty 42

## 2017-11-21 MED ORDER — DIPHENHYDRAMINE HCL 50 MG/ML IJ SOLN
50.0000 mg | Freq: Once | INTRAMUSCULAR | Status: AC
  Administered 2017-11-21: 50 mg via INTRAVENOUS

## 2017-11-21 MED ORDER — SODIUM CHLORIDE 0.9 % IV SOLN
140.0000 mg/m2 | Freq: Once | INTRAVENOUS | Status: AC
  Administered 2017-11-21: 216 mg via INTRAVENOUS
  Filled 2017-11-21: qty 36

## 2017-11-21 MED ORDER — LORAZEPAM 1 MG PO TABS: 1.0000 mg | ORAL_TABLET | Freq: Three times a day (TID) | ORAL | 1 refills | Status: DC | PRN

## 2017-11-21 MED ORDER — HEPARIN SOD (PORK) LOCK FLUSH 100 UNIT/ML IV SOLN
250.0000 [IU] | Freq: Once | INTRAVENOUS | Status: AC | PRN
  Administered 2017-11-21: 250 [IU]
  Filled 2017-11-21: qty 5

## 2017-11-21 MED ORDER — SODIUM CHLORIDE 0.9 % IV SOLN
Freq: Once | INTRAVENOUS | Status: AC
  Administered 2017-11-21: 11:00:00 via INTRAVENOUS
  Filled 2017-11-21: qty 250

## 2017-11-21 MED ORDER — FAMOTIDINE IN NACL 20-0.9 MG/50ML-% IV SOLN
20.0000 mg | Freq: Once | INTRAVENOUS | Status: AC
  Administered 2017-11-21: 20 mg via INTRAVENOUS

## 2017-11-21 MED ORDER — PALONOSETRON HCL INJECTION 0.25 MG/5ML
INTRAVENOUS | Status: AC
  Filled 2017-11-21: qty 5

## 2017-11-21 MED ORDER — SODIUM CHLORIDE 0.9% FLUSH
10.0000 mL | INTRAVENOUS | Status: DC | PRN
  Administered 2017-11-21: 10 mL
  Filled 2017-11-21: qty 10

## 2017-11-21 MED ORDER — FOSAPREPITANT DIMEGLUMINE INJECTION 150 MG
Freq: Once | INTRAVENOUS | Status: AC
  Administered 2017-11-21: 11:00:00 via INTRAVENOUS
  Filled 2017-11-21: qty 5

## 2017-11-21 MED ORDER — MIRTAZAPINE 15 MG PO TABS: 15.0000 mg | ORAL_TABLET | Freq: Every day | ORAL | 1 refills | Status: DC

## 2017-11-21 MED ORDER — PALONOSETRON HCL INJECTION 0.25 MG/5ML
0.2500 mg | Freq: Once | INTRAVENOUS | Status: AC
  Administered 2017-11-21: 0.25 mg via INTRAVENOUS

## 2017-11-21 MED ORDER — FAMOTIDINE IN NACL 20-0.9 MG/50ML-% IV SOLN
INTRAVENOUS | Status: AC
  Filled 2017-11-21: qty 50

## 2017-11-21 MED ORDER — DIPHENHYDRAMINE HCL 50 MG/ML IJ SOLN
INTRAMUSCULAR | Status: AC
  Filled 2017-11-21: qty 1

## 2017-11-21 NOTE — Telephone Encounter (Signed)
Gave patient avs and calendar of upcoming appts.  °

## 2017-11-21 NOTE — Assessment & Plan Note (Signed)
She has mild intermittent constipation from previous treatment She will continue laxative therapy

## 2017-11-21 NOTE — Assessment & Plan Note (Signed)
She has minimum peripheral neuropathy from before I plan to continue on similar dosing as before

## 2017-11-21 NOTE — Assessment & Plan Note (Signed)
She has occasional nausea and dizziness along with vertigo She will continue antiemetics as needed She will continue IV fluid hydration therapy

## 2017-11-21 NOTE — Assessment & Plan Note (Signed)
Well, she has excellent response to treatment She has minimum side effects except for occasional vertigo/nausea I recommend proceed with cycle 3 of treatment and then plan to repeat imaging study before cycle 4 If she has excellent response to treatment and continue to improve clinically, she might be a candidate for interval debulking surgery

## 2017-11-21 NOTE — Assessment & Plan Note (Signed)
I have discontinued trazodone and switch her to Remeron for weight gain and insomnia

## 2017-11-21 NOTE — Progress Notes (Signed)
[place] OFFICE PROGRESS NOTE  Patient Care Team: Donzetta Kohut as PCP - General (Physician Assistant) [name], MD as Consulting Physician (Obstetrics and Gynecology) Mauri Pole, MD as Consulting Physician (Gastroenterology) Belva Crome, MD as Consulting Physician (Cardiology)  ASSESSMENT & PLAN:  Disseminated ovarian cancer, left [facility]) Well, she has excellent response to treatment She has minimum side effects except for occasional vertigo/nausea I recommend proceed with cycle 3 of treatment and then plan to repeat imaging study before cycle 4 If she has excellent response to treatment and continue to improve clinically, she might be a candidate for interval debulking surgery  Other constipation She has mild intermittent constipation from previous treatment She will continue laxative therapy  Peripheral neuropathy due to chemotherapy [facility]) She has minimum peripheral neuropathy from before I plan to continue on similar dosing as before  Nausea without vomiting She has occasional nausea and dizziness along with vertigo She will continue antiemetics as needed She will continue IV fluid hydration therapy  Other insomnia I have discontinued trazodone and switch her to Remeron for weight gain and insomnia   Orders Placed This Encounter  Procedures  . CT CHEST W CONTRAST    Standing Status:   Future    Standing Expiration Date:   11/22/2018    Order Specific Question:   If indicated for the ordered procedure, I authorize the administration of contrast media per Radiology protocol    Answer:   Yes    Order Specific Question:   Preferred imaging location?    Answer:   [facility]    Order Specific Question:   Radiology Contrast Protocol - do NOT remove file path    Answer:   _0 [email or web address]  . CT ABDOMEN PELVIS W CONTRAST    Standing Status:   Future    Standing Expiration Date:   11/22/2018    Order  Specific Question:   If indicated for the ordered procedure, I authorize the administration of contrast media per Radiology protocol    Answer:   Yes    Order Specific Question:   Preferred imaging location?    Answer:   [facility]    Order Specific Question:   Radiology Contrast Protocol - do NOT remove file path    Answer:   _1 [email or web address]    INTERVAL HISTORY: Please see below for problem oriented charting. She returns for further follow-up She feels well She has lost 2 pounds due to recent vertigo and dizziness She continues on daily IV fluid hydration at home Denies worsening peripheral neuropathy No abdominal bloating or pain In fact, she rarely take pain medication anymore She requested something for sleep No recent infection, fever or chills Denies recent chest pain or shortness of breath  SUMMARY OF ONCOLOGIC HISTORY: Oncology History   [facility] stable     Disseminated ovarian cancer, left ([place])   12/17/2016 Imaging    US pelvis 1. There appears to be a 2.2 x 1.9 x 2.0 cm mass centered in the cervix, likely extending into the lower uterus. Recommend correlation with physical exam. An MRI could better evaluate. 2. Thickened endometrium measuring 12 mm. Recommend gynecologic consultation and endometrial sampling given patient age. 3. Probable fibroid in the uterus.      12/27/2016 Pathology Results    Endocervix, curettage - BENIGN ENDOCERVICAL MUCOSA AND BENIGN SQUAMOUS MUCOSA      01/01/2017 Imaging    MRI pelvis 1. No classic intermediate to high signal intensity tissue  disrupting the hypointense fibrous cervical stroma as typically seen in the setting of cervical carcinoma. However, there is asymmetric thickening of the posterior cervix with anterior displacement of the cervical canal and associated widening and heterogeneity of the endometrial cavity. As such, imaging features are indeterminate by MRI and close follow-up or tissue  sampling is recommended. 2. Apparent areas of tethering/scarring between the sigmoid colon posterior uterus/cervix. Diverticuli are noted in the sigmoid colon and this may be related to scarring from prior bouts is diverticulitis. 3. Small volume intraperitoneal free fluid.      01/28/2017 Pathology Results    1. Cervix, biopsy, posterior - SPINDLE CELL LESION CONSISTENT WITH SMOOTH MUSCLE NEOPLASM, SEE COMMENT. 2. Endometrium, curettage - BENIGN SQUAMOUS MUCOSA. - BENIGN SMOOTH MUSCLE. - NO ENDOMETRIUM PRESENT. Microscopic Comment 1. There is a spindle cell lesion with focal atypia. There is no necrosis or increase in mitotic figures. The sample is somewhat limited, but the differential includes a cellular leiomyoma.      01/28/2017 Surgery    Surgery: ultrasound guided dilation of cervix with D&C and cervical biopsy  Surgeons:  Donaciano Eva, MD  Operative findings: 6cm uterus, dilated endometrial cavity, very stenotic cervical os. Thickened, fibrotic cervix without discrete mass, grossly normal cervical mucosa.         03/22/2017 Imaging    CT abdomen and pelvis 1. Diffuse prominent fluid-filled small bowel with mesenteric edema, enteritis pattern. This may be infectious or inflammatory. There is no evidence of obstruction. 2. Mild omental stranding in the left abdomen likely reactive secondary to primary bowel process, omental caking is not entirely excluded. 3. Sigmoid colonic diverticulosis without diverticulitis. 4. Prominent heterogeneous endometrium, recommend correlation with recent D and C. 5.  Aortic Atherosclerosis (ICD10-I70.0).      06/26/2017 Imaging    US pelvis T/V images. Anteverted and heterogenous uterus measuring 5.85 x 3.87 x 3.36 cm. Right subserous fibroid with calcifications measuring 2.7 x 2.6 cm. Endometrial line appears thickened at 8.1 mm. Cervix appears normal, no mass seen. Right ovary not seen. Left adnexal mass cystic/solid measuring  7.6 x 6.0 x 7.1 cm. Positive color flow Doppler.No free fluid in the posterior cul-de-sac.      06/26/2017 Tumor Marker    Patient's tumor was tested for the following markers: CA-125 Results of the tumor marker test revealed 90      06/26/2017 Tumor Marker    Patient's tumor was tested for the following markers: CEA Results of the tumor marker test revealed 4.5      06/27/2017 Imaging    Ct abdomen and pelvis New 7.4 cm left adnexal mass with worsening abdominal ascites and omental nodularity, suggesting metastatic ovarian carcinoma.      07/02/2017 Pathology Results    Cervix, biopsy, ectocervix - SQUAMOUS MUCOSA WITH ATYPICAL INFILTRATE WITHIN FIBROMUSCULAR STROMA. - SEE MICROSCOPIC DESCRIPTION. Microscopic Comment There is a small fragment of squamous mucosa with a small portion of benign squamous epithelium. Within the fibromuscular stroma there are strands and nests of epithelioid cells, which have an infiltrating pattern and there are a few with cytoplasmic vacuoles. The findings are worrisome for an infiltrating neoplastic process and additional tissue may be indicated. There is insufficient tissue remaining in the current biopsy for additional studies.      07/11/2017 Procedure    CT-guided biopsy of the peritoneal thickening in the left lateral abdomen. Procedure was technically challenging due to the small amount of peritoneal thickening and close proximity of multiple bowel loops. False negative  biopsy is possible.      07/11/2017 Pathology Results    Soft Tissue Needle Core Biopsy, omentum - ADENOCARCINOMA. - SEE COMMENT. Microscopic Comment The malignant cells are positive for CDX-2, cytokeratin 20, and p53. They are negative for cytokeratin 7, estrogen receptor, and PAX-8. This immunohistochemical profile argues against a gynecologic primary, and the differential diagnosis includes gastrointestinal primary.      07/12/2017 Imaging    Ct abdomen and pelvis 1. New small  bowel obstruction with transition point seen in the right lower quadrant, possibly secondary to adhesions related to peritoneal carcinomatosis. No pneumatosis. 2. Findings suggestive of metastatic ovarian cancer. Slightly worsened abdominal ascites may be related to metastatic disease or reactive to the small-bowel obstruction. 3. Expected post biopsy changes in the left lower quadrant adjacent to peritoneal nodularity. No intra-abdominal hemorrhage.      07/12/2017 - 07/14/2017 Hospital Admission    She was admitted for management of subacute bowel obstruction      07/15/2017 Cancer Staging    Staging form: Ovary, Fallopian Tube, and Primary Peritoneal Carcinoma, AJCC 8th Edition - Clinical: cT3, cN0, cM0 - Signed by Heath Lark, MD on 10/09/2017      07/18/2017 Procedure    Successful placement of a right arm PICC with sonographic and fluoroscopic guidance. The catheter is ready for use.      08/04/2017 Imaging    The previously noted ascites in the abdomen and pelvis has resolved. 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  No small bowel or colonic obstruction is identified.  Generalized thickened bowel wall of the sigmoid colon.  Mixed cystic and solid mass of the left ovary is unchanged.  Status post prior cholecystectomy with mild postsurgical intrahepatic biliary ductal dilatation.       09/29/2017 Imaging    1. Interval resolution of small bowel obstruction. 2. Persistent findings of peritoneal carcinomatosis with improvement in ascites and probable improvement in the previously demonstrated ill-defined right lower quadrant mass. 3. Grossly stable complex left adnexal mass from recent prior studies. This was first demonstrated on the exam done 06/27/2017 and has enlarged since then, consistent with an ovarian metastasis. This abuts the sigmoid colon and may invade it. 4. Bladder wall thickening and surrounding soft tissue stranding consistent  with cystitis, possibly treatment related.       10/06/2017 Imaging    Incomplete barium enema due to severe, near complete obstruction of an irregular process in the sigmoid colon. The irregularity of this lesion is most concerning for colon cancer.      10/09/2017 Tumor Marker    Patient's tumor was tested for the following markers: CA-125 Results of the tumor marker test revealed 29.4      10/10/2017 -  Chemotherapy    The patient had carboplatin and Taxol      10/31/2017 Tumor Marker    Patient's tumor was tested for the following markers: CA-125 Results of the tumor marker test revealed 27.2       REVIEW OF SYSTEMS:   Constitutional: Denies fevers, chills or abnormal weight loss Eyes: Denies blurriness of vision Ears, nose, mouth, throat, and face: Denies mucositis or sore throat Respiratory: Denies cough, dyspnea or wheezes Cardiovascular: Denies palpitation, chest discomfort or lower extremity swelling Skin: Denies abnormal skin rashes Lymphatics: Denies new lymphadenopathy or easy bruising Neurological:Denies numbness, tingling or new weaknesses Behavioral/Psych: Mood is stable, no new changes  All other systems were reviewed with the patient and are negative.  I have reviewed the  past medical history, past surgical history, social history and family history with the patient and they are unchanged from previous note.  ALLERGIES:  is allergic to flagyl [metronidazole].  MEDICATIONS:  Current Outpatient Medications  Medication Sig Dispense Refill  . aspirin EC 81 MG tablet Take 81 mg by mouth daily.    Marland Kitchen FLUoxetine (PROZAC) 20 MG capsule Take 1 capsule (20 mg total) by mouth daily. 90 capsule 1  . LORazepam (ATIVAN) 1 MG tablet Take 1 tablet (1 mg total) by mouth every 8 (eight) hours as needed for anxiety or sleep. 90 tablet 1  . mirtazapine (REMERON) 15 MG tablet Take 1 tablet (15 mg total) by mouth at bedtime. 30 tablet 1  . morphine (MSIR) 15 MG tablet Take 1  tablet (15 mg total) by mouth every 4 (four) hours as needed for severe pain. 60 tablet 0  . ondansetron (ZOFRAN) 8 MG tablet Take 1 tablet (8 mg total) by mouth every 8 (eight) hours as needed for nausea. 30 tablet 3  . prochlorperazine (COMPAZINE) 10 MG tablet Take 1 tablet (10 mg total) by mouth every 6 (six) hours as needed for nausea or vomiting. 30 tablet 1  . sennosides-docusate sodium (SENOKOT-S) 8.6-50 MG tablet Take 2-4 tablets by mouth 2 (two) times daily.     Current Facility-Administered Medications  Medication Dose Route Frequency Provider Last Rate Last Dose  . 0.9 %  sodium chloride infusion  500 mL Intravenous Once Nandigam, Venia Minks, MD       Facility-Administered Medications Ordered in Other Visits  Medication Dose Route Frequency Provider Last Rate Last Dose  . CARBOplatin (PARAPLATIN) 420 mg in sodium chloride 0.9 % 250 mL chemo infusion  420 mg Intravenous Once Alvy Bimler, Eloina Ergle, MD      . heparin lock flush 100 unit/mL  250 Units Intracatheter Once PRN Alvy Bimler, Ibrahem Volkman, MD      . PACLitaxel (TAXOL) 216 mg in sodium chloride 0.9 % 250 mL chemo infusion (> 52m/m2)  140 mg/m2 (Treatment Plan Recorded) Intravenous Once GAlvy Bimler Laine Fonner, MD 95 mL/hr at 11/21/17 1158 216 mg at 11/21/17 1158  . sodium chloride flush (NS) 0.9 % injection 10 mL  10 mL Intracatheter PRN GAlvy Bimler Beren Yniguez, MD        PHYSICAL EXAMINATION: ECOG PERFORMANCE STATUS: 1 - Symptomatic but completely ambulatory  Vitals:   11/21/17 0953  BP: 106/71  Pulse: 87  Resp: 18  Temp: 97.8 F (36.6 C)  SpO2: 100%   Filed Weights   11/21/17 0953  Weight: 111 lb 6.4 oz (50.5 kg)    GENERAL:alert, no distress and comfortable SKIN: skin color, texture, turgor are normal, no rashes or significant lesions EYES: normal, Conjunctiva are pink and non-injected, sclera clear OROPHARYNX:no exudate, no erythema and lips, buccal mucosa, and tongue normal  NECK: supple, thyroid normal size, non-tender, without nodularity LYMPH:  no  palpable lymphadenopathy in the cervical, axillary or inguinal LUNGS: clear to auscultation and percussion with normal breathing effort HEART: regular rate & rhythm and no murmurs and no lower extremity edema ABDOMEN:abdomen soft, non-tender and normal bowel sounds Musculoskeletal:no cyanosis of digits and no clubbing  NEURO: alert & oriented x 3 with fluent speech, no focal motor/sensory deficits  LABORATORY DATA:  I have reviewed the data as listed    Component Value Date/Time   NA 139 11/21/2017 0906   NA 143 09/26/2017   K 3.9 11/21/2017 0906   CL 102 11/21/2017 0906   CO2 26 11/21/2017 0906   GLUCOSE  208 (H) 11/21/2017 0906   BUN 15 11/21/2017 0906   BUN 20 09/26/2017   CREATININE 0.81 11/21/2017 0906   CALCIUM 9.5 11/21/2017 0906   PROT 7.2 11/21/2017 0906   PROT 6.7 11/28/2016 0849   ALBUMIN 3.8 11/21/2017 0906   ALBUMIN 4.3 11/28/2016 0849   AST 10 (L) 11/21/2017 0906   ALT 10 11/21/2017 0906   ALKPHOS 99 11/21/2017 0906   BILITOT 0.3 11/21/2017 0906   GFRNONAA >60 11/21/2017 0906   GFRAA >60 11/21/2017 0906    No results found for: SPEP, UPEP  Lab Results  Component Value Date   WBC 5.0 11/21/2017   NEUTROABS 4.2 11/21/2017   HGB 13.7 11/21/2017   HCT 40.2 11/21/2017   MCV 100.2 11/21/2017   PLT 150 11/21/2017      Chemistry      Component Value Date/Time   NA 139 11/21/2017 0906   NA 143 09/26/2017   K 3.9 11/21/2017 0906   CL 102 11/21/2017 0906   CO2 26 11/21/2017 0906   BUN 15 11/21/2017 0906   BUN 20 09/26/2017   CREATININE 0.81 11/21/2017 0906   GLU 92 09/26/2017      Component Value Date/Time   CALCIUM 9.5 11/21/2017 0906   ALKPHOS 99 11/21/2017 0906   AST 10 (L) 11/21/2017 0906   ALT 10 11/21/2017 0906   BILITOT 0.3 11/21/2017 0906     All questions were answered. The patient knows to call the clinic with any problems, questions or concerns. No barriers to learning was detected.  I spent 25 minutes counseling the patient face to  face. The total time spent in the appointment was 30 minutes and more than 50% was on counseling and review of test results  Heath Lark, MD 11/21/2017 2:55 PM

## 2017-11-21 NOTE — Patient Instructions (Signed)
   [facility] Discharge Instructions for Patients Receiving Chemotherapy  Today you received the following chemotherapy agents Taxol and Carboplatin   To help prevent nausea and vomiting after your treatment, we encourage you to take your nausea medication as directed.    If you develop nausea and vomiting that is not controlled by your nausea medication, call the clinic.   BELOW ARE SYMPTOMS THAT SHOULD BE REPORTED IMMEDIATELY:  *FEVER GREATER THAN 100.5 F  *CHILLS WITH OR WITHOUT FEVER  NAUSEA AND VOMITING THAT IS NOT CONTROLLED WITH YOUR NAUSEA MEDICATION  *UNUSUAL SHORTNESS OF BREATH  *UNUSUAL BRUISING OR BLEEDING  TENDERNESS IN MOUTH AND THROAT WITH OR WITHOUT PRESENCE OF ULCERS  *URINARY PROBLEMS  *BOWEL PROBLEMS  UNUSUAL RASH Items with * indicate a potential emergency and should be followed up as soon as possible.  Feel free to call the clinic should you have any questions or concerns. The clinic phone number is [phone].  Please show the CHEMO ALERT CARD at check-in to the Emergency Department and triage nurse.   

## 2017-11-22 LAB — CA 125: CANCER ANTIGEN (CA) 125: 25.6 U/mL (ref 0.0–38.1)

## 2017-11-27 DIAGNOSIS — C786 Secondary malignant neoplasm of retroperitoneum and peritoneum: Secondary | ICD-10-CM | POA: Diagnosis not present

## 2017-11-27 DIAGNOSIS — C562 Malignant neoplasm of left ovary: Secondary | ICD-10-CM | POA: Diagnosis not present

## 2017-11-27 DIAGNOSIS — I429 Cardiomyopathy, unspecified: Secondary | ICD-10-CM | POA: Diagnosis not present

## 2017-11-27 DIAGNOSIS — I2510 Atherosclerotic heart disease of native coronary artery without angina pectoris: Secondary | ICD-10-CM | POA: Diagnosis not present

## 2017-11-27 DIAGNOSIS — C189 Malignant neoplasm of colon, unspecified: Secondary | ICD-10-CM | POA: Diagnosis not present

## 2017-11-27 DIAGNOSIS — K56699 Other intestinal obstruction unspecified as to partial versus complete obstruction: Secondary | ICD-10-CM | POA: Diagnosis not present

## 2017-12-04 DIAGNOSIS — C189 Malignant neoplasm of colon, unspecified: Secondary | ICD-10-CM | POA: Diagnosis not present

## 2017-12-04 DIAGNOSIS — C562 Malignant neoplasm of left ovary: Secondary | ICD-10-CM | POA: Diagnosis not present

## 2017-12-04 DIAGNOSIS — C786 Secondary malignant neoplasm of retroperitoneum and peritoneum: Secondary | ICD-10-CM | POA: Diagnosis not present

## 2017-12-04 DIAGNOSIS — I429 Cardiomyopathy, unspecified: Secondary | ICD-10-CM | POA: Diagnosis not present

## 2017-12-04 DIAGNOSIS — I2510 Atherosclerotic heart disease of native coronary artery without angina pectoris: Secondary | ICD-10-CM | POA: Diagnosis not present

## 2017-12-04 DIAGNOSIS — K56699 Other intestinal obstruction unspecified as to partial versus complete obstruction: Secondary | ICD-10-CM | POA: Diagnosis not present

## 2017-12-07 DIAGNOSIS — F329 Major depressive disorder, single episode, unspecified: Secondary | ICD-10-CM | POA: Diagnosis not present

## 2017-12-07 DIAGNOSIS — Z87891 Personal history of nicotine dependence: Secondary | ICD-10-CM | POA: Diagnosis not present

## 2017-12-07 DIAGNOSIS — K56699 Other intestinal obstruction unspecified as to partial versus complete obstruction: Secondary | ICD-10-CM | POA: Diagnosis not present

## 2017-12-07 DIAGNOSIS — C562 Malignant neoplasm of left ovary: Secondary | ICD-10-CM | POA: Diagnosis not present

## 2017-12-07 DIAGNOSIS — K5790 Diverticulosis of intestine, part unspecified, without perforation or abscess without bleeding: Secondary | ICD-10-CM | POA: Diagnosis not present

## 2017-12-07 DIAGNOSIS — I2510 Atherosclerotic heart disease of native coronary artery without angina pectoris: Secondary | ICD-10-CM | POA: Diagnosis not present

## 2017-12-07 DIAGNOSIS — C189 Malignant neoplasm of colon, unspecified: Secondary | ICD-10-CM | POA: Diagnosis not present

## 2017-12-07 DIAGNOSIS — E43 Unspecified severe protein-calorie malnutrition: Secondary | ICD-10-CM | POA: Diagnosis not present

## 2017-12-07 DIAGNOSIS — G893 Neoplasm related pain (acute) (chronic): Secondary | ICD-10-CM | POA: Diagnosis not present

## 2017-12-07 DIAGNOSIS — C786 Secondary malignant neoplasm of retroperitoneum and peritoneum: Secondary | ICD-10-CM | POA: Diagnosis not present

## 2017-12-07 DIAGNOSIS — E119 Type 2 diabetes mellitus without complications: Secondary | ICD-10-CM | POA: Diagnosis not present

## 2017-12-07 DIAGNOSIS — Z452 Encounter for adjustment and management of vascular access device: Secondary | ICD-10-CM | POA: Diagnosis not present

## 2017-12-07 DIAGNOSIS — M1990 Unspecified osteoarthritis, unspecified site: Secondary | ICD-10-CM | POA: Diagnosis not present

## 2017-12-07 DIAGNOSIS — I429 Cardiomyopathy, unspecified: Secondary | ICD-10-CM | POA: Diagnosis not present

## 2017-12-07 DIAGNOSIS — M5136 Other intervertebral disc degeneration, lumbar region: Secondary | ICD-10-CM | POA: Diagnosis not present

## 2017-12-07 DIAGNOSIS — Z7982 Long term (current) use of aspirin: Secondary | ICD-10-CM | POA: Diagnosis not present

## 2017-12-07 DIAGNOSIS — F411 Generalized anxiety disorder: Secondary | ICD-10-CM | POA: Diagnosis not present

## 2017-12-11 DIAGNOSIS — I2510 Atherosclerotic heart disease of native coronary artery without angina pectoris: Secondary | ICD-10-CM | POA: Diagnosis not present

## 2017-12-11 DIAGNOSIS — K56699 Other intestinal obstruction unspecified as to partial versus complete obstruction: Secondary | ICD-10-CM | POA: Diagnosis not present

## 2017-12-11 DIAGNOSIS — I429 Cardiomyopathy, unspecified: Secondary | ICD-10-CM | POA: Diagnosis not present

## 2017-12-11 DIAGNOSIS — C189 Malignant neoplasm of colon, unspecified: Secondary | ICD-10-CM | POA: Diagnosis not present

## 2017-12-11 DIAGNOSIS — C562 Malignant neoplasm of left ovary: Secondary | ICD-10-CM | POA: Diagnosis not present

## 2017-12-11 DIAGNOSIS — C786 Secondary malignant neoplasm of retroperitoneum and peritoneum: Secondary | ICD-10-CM | POA: Diagnosis not present

## 2017-12-12 ENCOUNTER — Ambulatory Visit (HOSPITAL_COMMUNITY)
Admission: RE | Admit: 2017-12-12 | Discharge: 2017-12-12 | Disposition: A | Discharge status: Home / Self Care | Payer: Medicare Other | Source: Ambulatory Visit | Attending: Hematology and Oncology | Admitting: Hematology and Oncology

## 2017-12-12 ENCOUNTER — Inpatient Hospital Stay: Payer: Medicare Other

## 2017-12-12 DIAGNOSIS — R110 Nausea: Secondary | ICD-10-CM | POA: Diagnosis not present

## 2017-12-12 DIAGNOSIS — C562 Malignant neoplasm of left ovary: Secondary | ICD-10-CM

## 2017-12-12 DIAGNOSIS — K5909 Other constipation: Secondary | ICD-10-CM | POA: Diagnosis not present

## 2017-12-12 DIAGNOSIS — I2510 Atherosclerotic heart disease of native coronary artery without angina pectoris: Secondary | ICD-10-CM | POA: Diagnosis not present

## 2017-12-12 DIAGNOSIS — K56699 Other intestinal obstruction unspecified as to partial versus complete obstruction: Secondary | ICD-10-CM | POA: Diagnosis not present

## 2017-12-12 DIAGNOSIS — C786 Secondary malignant neoplasm of retroperitoneum and peritoneum: Secondary | ICD-10-CM | POA: Insufficient documentation

## 2017-12-12 DIAGNOSIS — C801 Malignant (primary) neoplasm, unspecified: Secondary | ICD-10-CM | POA: Diagnosis not present

## 2017-12-12 DIAGNOSIS — Z7189 Other specified counseling: Secondary | ICD-10-CM

## 2017-12-12 DIAGNOSIS — R188 Other ascites: Secondary | ICD-10-CM | POA: Insufficient documentation

## 2017-12-12 DIAGNOSIS — Z5111 Encounter for antineoplastic chemotherapy: Secondary | ICD-10-CM | POA: Diagnosis not present

## 2017-12-12 DIAGNOSIS — G620 Drug-induced polyneuropathy: Secondary | ICD-10-CM | POA: Diagnosis not present

## 2017-12-12 DIAGNOSIS — I429 Cardiomyopathy, unspecified: Secondary | ICD-10-CM | POA: Diagnosis not present

## 2017-12-12 DIAGNOSIS — C189 Malignant neoplasm of colon, unspecified: Secondary | ICD-10-CM | POA: Diagnosis not present

## 2017-12-12 LAB — CMP (CANCER CENTER ONLY)
ALT: 32 U/L (ref 0–44)
AST: 23 U/L (ref 15–41)
Albumin: 3.6 g/dL (ref 3.5–5.0)
Alkaline Phosphatase: 113 U/L (ref 38–126)
Anion gap: 9 (ref 5–15)
BUN: 16 mg/dL (ref 8–23)
CHLORIDE: 103 mmol/L (ref 98–111)
CO2: 28 mmol/L (ref 22–32)
Calcium: 9.6 mg/dL (ref 8.9–10.3)
Creatinine: 0.69 mg/dL (ref 0.44–1.00)
Glucose, Bld: 82 mg/dL (ref 70–99)
POTASSIUM: 3.9 mmol/L (ref 3.5–5.1)
SODIUM: 140 mmol/L (ref 135–145)
Total Bilirubin: 0.2 mg/dL — ABNORMAL LOW (ref 0.3–1.2)
Total Protein: 7.1 g/dL (ref 6.5–8.1)

## 2017-12-12 LAB — CBC WITH DIFFERENTIAL (CANCER CENTER ONLY)
Basophils Absolute: 0 10*3/uL (ref 0.0–0.1)
Basophils Relative: 0 %
Eosinophils Absolute: 0 10*3/uL (ref 0.0–0.5)
Eosinophils Relative: 0 %
HCT: 37.6 % (ref 34.8–46.6)
HEMOGLOBIN: 12.8 g/dL (ref 11.6–15.9)
LYMPHS ABS: 2.2 10*3/uL (ref 0.9–3.3)
LYMPHS PCT: 39 %
MCH: 34.5 pg — AB (ref 25.1–34.0)
MCHC: 34 g/dL (ref 31.5–36.0)
MCV: 101.3 fL — AB (ref 79.5–101.0)
MONOS PCT: 12 %
Monocytes Absolute: 0.7 10*3/uL (ref 0.1–0.9)
NEUTROS PCT: 49 %
Neutro Abs: 2.7 10*3/uL (ref 1.5–6.5)
Platelet Count: 151 10*3/uL (ref 145–400)
RBC: 3.71 MIL/uL (ref 3.70–5.45)
RDW: 14.6 % — ABNORMAL HIGH (ref 11.2–14.5)
WBC: 5.7 10*3/uL (ref 3.9–10.3)

## 2017-12-12 MED ORDER — IOHEXOL 300 MG/ML  SOLN
100.0000 mL | Freq: Once | INTRAMUSCULAR | Status: AC | PRN
  Administered 2017-12-12: 100 mL via INTRAVENOUS

## 2017-12-13 LAB — CA 125: CANCER ANTIGEN (CA) 125: 25.9 U/mL (ref 0.0–38.1)

## 2017-12-15 ENCOUNTER — Telehealth: Payer: Self-pay | Admitting: Hematology and Oncology

## 2017-12-15 ENCOUNTER — Inpatient Hospital Stay: Payer: Medicare Other

## 2017-12-15 ENCOUNTER — Inpatient Hospital Stay (HOSPITAL_BASED_OUTPATIENT_CLINIC_OR_DEPARTMENT_OTHER): Payer: Medicare Other | Admitting: Hematology and Oncology

## 2017-12-15 ENCOUNTER — Encounter: Payer: Self-pay | Admitting: Oncology

## 2017-12-15 DIAGNOSIS — R110 Nausea: Secondary | ICD-10-CM

## 2017-12-15 DIAGNOSIS — G620 Drug-induced polyneuropathy: Secondary | ICD-10-CM | POA: Diagnosis not present

## 2017-12-15 DIAGNOSIS — R112 Nausea with vomiting, unspecified: Secondary | ICD-10-CM

## 2017-12-15 DIAGNOSIS — C562 Malignant neoplasm of left ovary: Secondary | ICD-10-CM

## 2017-12-15 DIAGNOSIS — C786 Secondary malignant neoplasm of retroperitoneum and peritoneum: Secondary | ICD-10-CM

## 2017-12-15 DIAGNOSIS — Z7189 Other specified counseling: Secondary | ICD-10-CM

## 2017-12-15 DIAGNOSIS — K5909 Other constipation: Secondary | ICD-10-CM | POA: Diagnosis not present

## 2017-12-15 DIAGNOSIS — T451X5A Adverse effect of antineoplastic and immunosuppressive drugs, initial encounter: Secondary | ICD-10-CM

## 2017-12-15 DIAGNOSIS — Z5111 Encounter for antineoplastic chemotherapy: Secondary | ICD-10-CM | POA: Diagnosis not present

## 2017-12-15 MED ORDER — SODIUM CHLORIDE 0.9 % IV SOLN
Freq: Once | INTRAVENOUS | Status: AC
  Administered 2017-12-15: 10:00:00 via INTRAVENOUS
  Filled 2017-12-15: qty 5

## 2017-12-15 MED ORDER — HEPARIN SOD (PORK) LOCK FLUSH 100 UNIT/ML IV SOLN
250.0000 [IU] | Freq: Once | INTRAVENOUS | Status: AC | PRN
  Administered 2017-12-15: 250 [IU]
  Filled 2017-12-15: qty 5

## 2017-12-15 MED ORDER — SODIUM CHLORIDE 0.9 % IV SOLN
140.0000 mg/m2 | Freq: Once | INTRAVENOUS | Status: AC
  Administered 2017-12-15: 216 mg via INTRAVENOUS
  Filled 2017-12-15: qty 36

## 2017-12-15 MED ORDER — SODIUM CHLORIDE 0.9 % IV SOLN
415.8000 mg | Freq: Once | INTRAVENOUS | Status: AC
  Administered 2017-12-15: 420 mg via INTRAVENOUS
  Filled 2017-12-15: qty 42

## 2017-12-15 MED ORDER — DIPHENHYDRAMINE HCL 50 MG/ML IJ SOLN
INTRAMUSCULAR | Status: AC
  Filled 2017-12-15: qty 1

## 2017-12-15 MED ORDER — PALONOSETRON HCL INJECTION 0.25 MG/5ML
INTRAVENOUS | Status: AC
  Filled 2017-12-15: qty 5

## 2017-12-15 MED ORDER — FAMOTIDINE IN NACL 20-0.9 MG/50ML-% IV SOLN
INTRAVENOUS | Status: AC
  Filled 2017-12-15: qty 50

## 2017-12-15 MED ORDER — PALONOSETRON HCL INJECTION 0.25 MG/5ML
0.2500 mg | Freq: Once | INTRAVENOUS | Status: AC
  Administered 2017-12-15: 0.25 mg via INTRAVENOUS

## 2017-12-15 MED ORDER — SODIUM CHLORIDE 0.9 % IV SOLN
Freq: Once | INTRAVENOUS | Status: AC
  Administered 2017-12-15: 09:00:00 via INTRAVENOUS
  Filled 2017-12-15: qty 250

## 2017-12-15 MED ORDER — FAMOTIDINE IN NACL 20-0.9 MG/50ML-% IV SOLN
20.0000 mg | Freq: Once | INTRAVENOUS | Status: AC
  Administered 2017-12-15: 20 mg via INTRAVENOUS

## 2017-12-15 MED ORDER — DIPHENHYDRAMINE HCL 50 MG/ML IJ SOLN
50.0000 mg | Freq: Once | INTRAMUSCULAR | Status: AC
  Administered 2017-12-15: 50 mg via INTRAVENOUS

## 2017-12-15 MED ORDER — SODIUM CHLORIDE 0.9% FLUSH
10.0000 mL | INTRAVENOUS | Status: DC | PRN
  Administered 2017-12-15: 10 mL
  Filled 2017-12-15: qty 10

## 2017-12-15 NOTE — Telephone Encounter (Signed)
Gave avs and calendar need to ask about 9/16

## 2017-12-15 NOTE — Addendum Note (Signed)
Addended by: Elmo Putt R on: 12/15/2017 11:40 AM   Modules accepted: Orders

## 2017-12-15 NOTE — Telephone Encounter (Signed)
Called regarding times patient said she did not need  Flush appt she get it done at home. MD said ok

## 2017-12-15 NOTE — Progress Notes (Addendum)
Gynecologic Oncology Multi-Disciplinary Disposition Conference Note  Date of the Conference: 12/15/2017  Patient Name: Carol Ferguson  Referring Provider: Primary GYN Oncologist:  Stage/Disposition:  Probable disseminated ovarian cancer, left. Disposition is to 3 additional cycles of carboplatin and Taxol then scan then possible surgery. Schedule to see Dr. Denman George for pelvic examination.  Recommend genetics referral.  This Multidisciplinary conference took place involving physicians from Gynecologic Oncology, Medical Oncology, Radiation Oncology, Pathology, Radiology along with the Gynecologic Oncology Nurse Practitioner and RN.  Comprehensive assessment of the patient's malignancy, staging, need for surgery, chemotherapy, radiation therapy, and need for further testing were reviewed. Supportive measures, both inpatient and following discharge were also discussed. The recommended plan of care is documented. Greater than 35 minutes were spent correlating and coordinating this patient's care.

## 2017-12-15 NOTE — Patient Instructions (Signed)
Cancer Center Discharge Instructions for Patients Receiving Chemotherapy  Today you received the following chemotherapy agents:  Taxol, Carboplatin  To help prevent nausea and vomiting after your treatment, we encourage you to take your nausea medication as prescribed.   If you develop nausea and vomiting that is not controlled by your nausea medication, call the clinic.   BELOW ARE SYMPTOMS THAT SHOULD BE REPORTED IMMEDIATELY:  *FEVER GREATER THAN 100.5 F  *CHILLS WITH OR WITHOUT FEVER  NAUSEA AND VOMITING THAT IS NOT CONTROLLED WITH YOUR NAUSEA MEDICATION  *UNUSUAL SHORTNESS OF BREATH  *UNUSUAL BRUISING OR BLEEDING  TENDERNESS IN MOUTH AND THROAT WITH OR WITHOUT PRESENCE OF ULCERS  *URINARY PROBLEMS  *BOWEL PROBLEMS  UNUSUAL RASH Items with * indicate a potential emergency and should be followed up as soon as possible.  Feel free to call the clinic should you have any questions or concerns. The clinic phone number is [phone].  Please show the CHEMO ALERT CARD at check-in to the Emergency Department and triage nurse.   

## 2017-12-16 ENCOUNTER — Telehealth: Payer: Self-pay | Admitting: Oncology

## 2017-12-16 ENCOUNTER — Telehealth: Payer: Self-pay | Admitting: Hematology and Oncology

## 2017-12-16 ENCOUNTER — Encounter: Payer: Self-pay | Admitting: Hematology and Oncology

## 2017-12-16 NOTE — Telephone Encounter (Signed)
Called Carol Ferguson and advised her of appointment with Dr. Denman George on 01/16/18 at 10:15 am.  She verbalized understanding and agreement.

## 2017-12-16 NOTE — Progress Notes (Signed)
**Carol Carol** Carol Carol  Patient Care Team: Donzetta Kohut as PCP - General (Physician Assistant) [name], MD as Consulting Physician (Obstetrics and Gynecology) Mauri Pole, MD as Consulting Physician (Gastroenterology) Belva Crome, MD as Consulting Physician (Cardiology)  ASSESSMENT & PLAN:  Disseminated ovarian cancer, left ([place]) Overall, she tolerated treatment well Tumor marker is improving Some of the peritoneal nodularity is improved/stable although the size of the left adnexal mass is unchanged Her CT imaging was discussed at the tumor board and I have reviewed multiple imaging studies with the patient and family For now, I recommend we continue 3 more cycles of chemotherapy before repeat imaging study  Peripheral neuropathy due to chemotherapy [facility]) She has minimum peripheral neuropathy affecting the bottom of her toes but not significant Monitor closely only for now  Nausea without vomiting She had history of nausea but is improving with regular IV fluid hydration Overall, she is doing well and is gaining weight I recommend she continues the same   No orders of the defined types were placed in this encounter.   INTERVAL HISTORY: Please see below for problem oriented charting. She returns for cycle 4 of chemotherapy She is present with multiple family members Since last time I saw her, she felt some mild peripheral neuropathy affecting the left foot but not significant She is receiving IV fluids daily to prevent dehydration She denies significant nausea She is taking laxatives on a regular basis and denies recent bowel obstruction or constipation Overall, her health status is improving and she is gaining weight  SUMMARY OF ONCOLOGIC HISTORY: Oncology History   MSI stable     Disseminated ovarian cancer, left ([place])   12/17/2016 Imaging    US pelvis 1. There appears to be a 2.2 x 1.9 x 2.0 cm mass centered in the  cervix, likely extending into the lower uterus. Recommend correlation with physical exam. An MRI could better evaluate. 2. Thickened endometrium measuring 12 mm. Recommend gynecologic consultation and endometrial sampling given patient age. 3. Probable fibroid in the uterus.    12/27/2016 Pathology Results    Endocervix, curettage - BENIGN ENDOCERVICAL MUCOSA AND BENIGN SQUAMOUS MUCOSA    01/01/2017 Imaging    MRI pelvis 1. No classic intermediate to high signal intensity tissue disrupting the hypointense fibrous cervical stroma as typically seen in the setting of cervical carcinoma. However, there is asymmetric thickening of the posterior cervix with anterior displacement of the cervical canal and associated widening and heterogeneity of the endometrial cavity. As such, imaging features are indeterminate by MRI and close follow-up or tissue sampling is recommended. 2. Apparent areas of tethering/scarring between the sigmoid colon posterior uterus/cervix. Diverticuli are noted in the sigmoid colon and this may be related to scarring from prior bouts is diverticulitis. 3. Small volume intraperitoneal free fluid.    01/28/2017 Pathology Results    1. Cervix, biopsy, posterior - SPINDLE CELL LESION CONSISTENT WITH SMOOTH MUSCLE NEOPLASM, SEE COMMENT. 2. Endometrium, curettage - BENIGN SQUAMOUS MUCOSA. - BENIGN SMOOTH MUSCLE. - NO ENDOMETRIUM PRESENT. Microscopic Comment 1. There is a spindle cell lesion with focal atypia. There is no necrosis or increase in mitotic figures. The sample is somewhat limited, but the differential includes a cellular leiomyoma.    01/28/2017 Surgery    Surgery: ultrasound guided dilation of cervix with D&C and cervical biopsy  Surgeons:  Donaciano Eva, MD  Operative findings: 6cm uterus, dilated endometrial cavity, very stenotic cervical os. Thickened, fibrotic cervix without  discrete mass, grossly normal cervical mucosa.       03/22/2017 Imaging    CT  abdomen and pelvis 1. Diffuse prominent fluid-filled small bowel with mesenteric edema, enteritis pattern. This may be infectious or inflammatory. There is no evidence of obstruction. 2. Mild omental stranding in the left abdomen likely reactive secondary to primary bowel process, omental caking is not entirely excluded. 3. Sigmoid colonic diverticulosis without diverticulitis. 4. Prominent heterogeneous endometrium, recommend correlation with recent D and C. 5.  Aortic Atherosclerosis (ICD10-I70.0).    06/26/2017 Imaging    US pelvis T/V images. Anteverted and heterogenous uterus measuring 5.85 x 3.87 x 3.36 cm. Right subserous fibroid with calcifications measuring 2.7 x 2.6 cm. Endometrial line appears thickened at 8.1 mm. Cervix appears normal, no mass seen. Right ovary not seen. Left adnexal mass cystic/solid measuring 7.6 x 6.0 x 7.1 cm. Positive color flow Doppler.No free fluid in the posterior cul-de-sac.    06/26/2017 Tumor Marker    Patient's tumor was tested for the following markers: CA-125 Results of the tumor marker test revealed 90    06/26/2017 Tumor Marker    Patient's tumor was tested for the following markers: CEA Results of the tumor marker test revealed 4.5    06/27/2017 Imaging    Ct abdomen and pelvis New 7.4 cm left adnexal mass with worsening abdominal ascites and omental nodularity, suggesting metastatic ovarian carcinoma.    07/02/2017 Pathology Results    Cervix, biopsy, ectocervix - SQUAMOUS MUCOSA WITH ATYPICAL INFILTRATE WITHIN FIBROMUSCULAR STROMA. - SEE MICROSCOPIC DESCRIPTION. Microscopic Comment There is a small fragment of squamous mucosa with a small portion of benign squamous epithelium. Within the fibromuscular stroma there are strands and nests of epithelioid cells, which have an infiltrating pattern and there are a few with cytoplasmic vacuoles. The findings are worrisome for an infiltrating neoplastic process and additional tissue may be indicated.  There is insufficient tissue remaining in the current biopsy for additional studies.    07/11/2017 Procedure    CT-guided biopsy of the peritoneal thickening in the left lateral abdomen. Procedure was technically challenging due to the small amount of peritoneal thickening and close proximity of multiple bowel loops. False negative biopsy is possible.    07/11/2017 Pathology Results    Soft Tissue Needle Core Biopsy, omentum - ADENOCARCINOMA. - SEE COMMENT. Microscopic Comment The malignant cells are positive for CDX-2, cytokeratin 20, and p53. They are negative for cytokeratin 7, estrogen receptor, and PAX-8. This immunohistochemical profile argues against a gynecologic primary, and the differential diagnosis includes gastrointestinal primary.    07/12/2017 Imaging    Ct abdomen and pelvis 1. New small bowel obstruction with transition point seen in the right lower quadrant, possibly secondary to adhesions related to peritoneal carcinomatosis. No pneumatosis. 2. Findings suggestive of metastatic ovarian cancer. Slightly worsened abdominal ascites may be related to metastatic disease or reactive to the small-bowel obstruction. 3. Expected post biopsy changes in the left lower quadrant adjacent to peritoneal nodularity. No intra-abdominal hemorrhage.    07/12/2017 - 07/14/2017 Hospital Admission    She was admitted for management of subacute bowel obstruction    07/15/2017 Cancer Staging    Staging form: Ovary, Fallopian Tube, and Primary Peritoneal Carcinoma, AJCC 8th Edition - Clinical: cT3, cN0, cM0 - Signed by Heath Lark, MD on 10/09/2017    07/18/2017 Procedure    Successful placement of a right arm PICC with sonographic and fluoroscopic guidance. The catheter is ready for use.    08/04/2017 Imaging  The previously noted ascites in the abdomen and pelvis has resolved. 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  No small bowel or colonic obstruction is  identified.  Generalized thickened bowel wall of the sigmoid colon.  Mixed cystic and solid mass of the left ovary is unchanged.  Status post prior cholecystectomy with mild postsurgical intrahepatic biliary ductal dilatation.     09/29/2017 Imaging    1. Interval resolution of small bowel obstruction. 2. Persistent findings of peritoneal carcinomatosis with improvement in ascites and probable improvement in the previously demonstrated ill-defined right lower quadrant mass. 3. Grossly stable complex left adnexal mass from recent prior studies. This was first demonstrated on the exam done 06/27/2017 and has enlarged since then, consistent with an ovarian metastasis. This abuts the sigmoid colon and may invade it. 4. Bladder wall thickening and surrounding soft tissue stranding consistent with cystitis, possibly treatment related.     10/06/2017 Imaging    Incomplete barium enema due to severe, near complete obstruction of an irregular process in the sigmoid colon. The irregularity of this lesion is most concerning for colon cancer.    10/09/2017 Tumor Marker    Patient's tumor was tested for the following markers: CA-125 Results of the tumor marker test revealed 29.4    10/10/2017 -  Chemotherapy    The patient had carboplatin and Taxol    10/31/2017 Tumor Marker    Patient's tumor was tested for the following markers: CA-125 Results of the tumor marker test revealed 27.2    11/21/2017 Tumor Marker    Patient's tumor was tested for the following markers: CA-125 Results of the tumor marker test revealed 25.6    12/12/2017 Imaging    1. Interval increase in size of complex left adnexal mass. 2. Similar-appearing peritoneal carcinomatosis and small volume ascites.     REVIEW OF SYSTEMS:   Constitutional: Denies fevers, chills or abnormal weight loss Eyes: Denies blurriness of vision Ears, nose, mouth, throat, and face: Denies mucositis or sore throat Respiratory: Denies cough,  dyspnea or wheezes Cardiovascular: Denies palpitation, chest discomfort or lower extremity swelling Skin: Denies abnormal skin rashes Lymphatics: Denies new lymphadenopathy or easy bruising Neurological:Denies numbness, tingling or new weaknesses Behavioral/Psych: Mood is stable, no new changes  All other systems were reviewed with the patient and are negative.  I have reviewed the past medical history, past surgical history, social history and family history with the patient and they are unchanged from previous Carol.  ALLERGIES:  is allergic to flagyl [metronidazole].  MEDICATIONS:  Current Outpatient Medications  Medication Sig Dispense Refill  . aspirin EC 81 MG tablet Take 81 mg by mouth daily.    Marland Kitchen FLUoxetine (PROZAC) 20 MG capsule Take 1 capsule (20 mg total) by mouth daily. 90 capsule 1  . LORazepam (ATIVAN) 1 MG tablet Take 1 tablet (1 mg total) by mouth every 8 (eight) hours as needed for anxiety or sleep. 90 tablet 1  . mirtazapine (REMERON) 15 MG tablet Take 1 tablet (15 mg total) by mouth at bedtime. 30 tablet 1  . morphine (MSIR) 15 MG tablet Take 1 tablet (15 mg total) by mouth every 4 (four) hours as needed for severe pain. 60 tablet 0  . ondansetron (ZOFRAN) 8 MG tablet Take 1 tablet (8 mg total) by mouth every 8 (eight) hours as needed for nausea. 30 tablet 3  . prochlorperazine (COMPAZINE) 10 MG tablet Take 1 tablet (10 mg total) by mouth every 6 (six) hours as needed for  nausea or vomiting. 30 tablet 1  . sennosides-docusate sodium (SENOKOT-S) 8.6-50 MG tablet Take 2-4 tablets by mouth 2 (two) times daily.     Current Facility-Administered Medications  Medication Dose Route Frequency Provider Last Rate Last Dose  . 0.9 %  sodium chloride infusion  500 mL Intravenous Once Nandigam, Venia Minks, MD        PHYSICAL EXAMINATION: ECOG PERFORMANCE STATUS: 1 - Symptomatic but completely ambulatory  Vitals:   12/15/17 0821  BP: 123/63  Pulse: 81  Resp: 18  Temp: 97.9 F  (36.6 C)  SpO2: 100%   Filed Weights   12/15/17 0821  Weight: 119 lb 12.8 oz (54.3 kg)    GENERAL:alert, no distress and comfortable SKIN: skin color, texture, turgor are normal, no rashes or significant lesions EYES: normal, Conjunctiva are pink and non-injected, sclera clear OROPHARYNX:no exudate, no erythema and lips, buccal mucosa, and tongue normal  NECK: supple, thyroid normal size, non-tender, without nodularity LYMPH:  no palpable lymphadenopathy in the cervical, axillary or inguinal LUNGS: clear to auscultation and percussion with normal breathing effort HEART: regular rate & rhythm and no murmurs and no lower extremity edema ABDOMEN:abdomen soft, non-tender and normal bowel sounds Musculoskeletal:no cyanosis of digits and no clubbing  NEURO: alert & oriented x 3 with fluent speech, no focal motor/sensory deficits  LABORATORY DATA:  I have reviewed the data as listed    Component Value Date/Time   NA 140 12/12/2017 0912   NA 143 09/26/2017   K 3.9 12/12/2017 0912   CL 103 12/12/2017 0912   CO2 28 12/12/2017 0912   GLUCOSE 82 12/12/2017 0912   BUN 16 12/12/2017 0912   BUN 20 09/26/2017   CREATININE 0.69 12/12/2017 0912   CALCIUM 9.6 12/12/2017 0912   PROT 7.1 12/12/2017 0912   PROT 6.7 11/28/2016 0849   ALBUMIN 3.6 12/12/2017 0912   ALBUMIN 4.3 11/28/2016 0849   AST 23 12/12/2017 0912   ALT 32 12/12/2017 0912   ALKPHOS 113 12/12/2017 0912   BILITOT 0.2 (L) 12/12/2017 0912   GFRNONAA >60 12/12/2017 0912   GFRAA >60 12/12/2017 0912    No results found for: SPEP, UPEP  Lab Results  Component Value Date   WBC 5.7 12/12/2017   NEUTROABS 2.7 12/12/2017   HGB 12.8 12/12/2017   HCT 37.6 12/12/2017   MCV 101.3 (H) 12/12/2017   PLT 151 12/12/2017      Chemistry      Component Value Date/Time   NA 140 12/12/2017 0912   NA 143 09/26/2017   K 3.9 12/12/2017 0912   CL 103 12/12/2017 0912   CO2 28 12/12/2017 0912   BUN 16 12/12/2017 0912   BUN 20  09/26/2017   CREATININE 0.69 12/12/2017 0912   GLU 92 09/26/2017      Component Value Date/Time   CALCIUM 9.6 12/12/2017 0912   ALKPHOS 113 12/12/2017 0912   AST 23 12/12/2017 0912   ALT 32 12/12/2017 0912   BILITOT 0.2 (L) 12/12/2017 0912       RADIOGRAPHIC STUDIES: I have reviewed multiple imaging studies with patient and family I have personally reviewed the radiological images as listed and agreed with the findings in the report. Ct Chest W Contrast  Result Date: 12/12/2017 CLINICAL DATA:  Patient with history of ovarian carcinoma. Follow-up exam. EXAM: CT CHEST, ABDOMEN, AND PELVIS WITH CONTRAST TECHNIQUE: Multidetector CT imaging of the chest, abdomen and pelvis was performed following the standard protocol during bolus administration of intravenous contrast. CONTRAST:  144m OMNIPAQUE IOHEXOL 300 MG/ML  SOLN COMPARISON:  Abdomen pelvic CT 09/29/2017; chest CT 08/12/2006 FINDINGS: CT CHEST FINDINGS Cardiovascular: Normal heart size. Thoracic aortic and coronary arterial vascular calcifications. Small fluid superior pericardial recess. Mediastinum/Nodes: No enlarged axillary, mediastinal or hilar lymphadenopathy. Normal appearance of the esophagus. Lungs/Pleura: Central airways are patent. Dependent atelectasis within the bilateral lower lobes. No large area of pulmonary consolidation. No pleural effusion or pneumothorax. Musculoskeletal: Thoracic spine degenerative changes. No aggressive or acute appearing osseous lesions. CT ABDOMEN PELVIS FINDINGS Hepatobiliary: Liver is normal in size and contour. No focal hepatic lesion is identified. Patient status post cholecystectomy. No intrahepatic or extrahepatic biliary ductal dilatation. Pancreas: Unremarkable Spleen: Unremarkable Adrenals/Urinary Tract: Adrenal glands are normal. Kidneys enhance symmetrically with contrast. Simple cyst superior pole left kidney. No hydronephrosis. Urinary bladder is unremarkable. Stomach/Bowel: Oral contrast  material to the level of the rectum. No evidence for bowel obstruction. Vascular/Lymphatic: Normal caliber abdominal aorta. Peripheral calcified atherosclerotic plaque. No retroperitoneal. Reproductive: Interval increase in size complex cystic and solid left adnexal mass measuring 5.0 x 5.0 cm (image 97; series 2), previously 4.3 x 3.9 cm. Mass measures up to 9 cm in craniocaudal dimension on coronal imaging. Mass abuts the sigmoid colon. Other: Similar-appearing omental carcinomatosis peritoneal nodularity. Small amount of perihepatic fluid. No free intraperitoneal air. Musculoskeletal: lumbar spine degenerative changes. No aggressive or acute appearing osseous lesions. IMPRESSION: 1. Interval increase in size of complex left adnexal mass. 2. Similar-appearing peritoneal carcinomatosis and small volume ascites. Electronically Signed   By: DLovey NewcomerM.D.   On: 12/12/2017 12:03   Ct Abdomen Pelvis W Contrast  Result Date: 12/12/2017 CLINICAL DATA:  Patient with history of ovarian carcinoma. Follow-up exam. EXAM: CT CHEST, ABDOMEN, AND PELVIS WITH CONTRAST TECHNIQUE: Multidetector CT imaging of the chest, abdomen and pelvis was performed following the standard protocol during bolus administration of intravenous contrast. CONTRAST:  1077mOMNIPAQUE IOHEXOL 300 MG/ML  SOLN COMPARISON:  Abdomen pelvic CT 09/29/2017; chest CT 08/12/2006 FINDINGS: CT CHEST FINDINGS Cardiovascular: Normal heart size. Thoracic aortic and coronary arterial vascular calcifications. Small fluid superior pericardial recess. Mediastinum/Nodes: No enlarged axillary, mediastinal or hilar lymphadenopathy. Normal appearance of the esophagus. Lungs/Pleura: Central airways are patent. Dependent atelectasis within the bilateral lower lobes. No large area of pulmonary consolidation. No pleural effusion or pneumothorax. Musculoskeletal: Thoracic spine degenerative changes. No aggressive or acute appearing osseous lesions. CT ABDOMEN PELVIS FINDINGS  Hepatobiliary: Liver is normal in size and contour. No focal hepatic lesion is identified. Patient status post cholecystectomy. No intrahepatic or extrahepatic biliary ductal dilatation. Pancreas: Unremarkable Spleen: Unremarkable Adrenals/Urinary Tract: Adrenal glands are normal. Kidneys enhance symmetrically with contrast. Simple cyst superior pole left kidney. No hydronephrosis. Urinary bladder is unremarkable. Stomach/Bowel: Oral contrast material to the level of the rectum. No evidence for bowel obstruction. Vascular/Lymphatic: Normal caliber abdominal aorta. Peripheral calcified atherosclerotic plaque. No retroperitoneal. Reproductive: Interval increase in size complex cystic and solid left adnexal mass measuring 5.0 x 5.0 cm (image 97; series 2), previously 4.3 x 3.9 cm. Mass measures up to 9 cm in craniocaudal dimension on coronal imaging. Mass abuts the sigmoid colon. Other: Similar-appearing omental carcinomatosis peritoneal nodularity. Small amount of perihepatic fluid. No free intraperitoneal air. Musculoskeletal: lumbar spine degenerative changes. No aggressive or acute appearing osseous lesions. IMPRESSION: 1. Interval increase in size of complex left adnexal mass. 2. Similar-appearing peritoneal carcinomatosis and small volume ascites. Electronically Signed   By: DrLovey Newcomer.D.   On: 12/12/2017 12:03    All questions were  answered. The patient knows to call the clinic with any problems, questions or concerns. No barriers to learning was detected.  I spent 25 minutes counseling the patient face to face. The total time spent in the appointment was 30 minutes and more than 50% was on counseling and review of test results  Heath Lark, MD 12/16/2017 1:12 PM

## 2017-12-16 NOTE — Assessment & Plan Note (Signed)
She has minimum peripheral neuropathy affecting the bottom of her toes but not significant Monitor closely only for now

## 2017-12-16 NOTE — Telephone Encounter (Signed)
Scheduled appt per 8/26 sch message- pt is aware of appt date and time  

## 2017-12-16 NOTE — Assessment & Plan Note (Signed)
She had history of nausea but is improving with regular IV fluid hydration Overall, she is doing well and is gaining weight I recommend she continues the same

## 2017-12-16 NOTE — Assessment & Plan Note (Signed)
Overall, she tolerated treatment well Tumor marker is improving Some of the peritoneal nodularity is improved/stable although the size of the left adnexal mass is unchanged Her CT imaging was discussed at the tumor board and I have reviewed multiple imaging studies with the patient and family For now, I recommend we continue 3 more cycles of chemotherapy before repeat imaging study

## 2017-12-18 DIAGNOSIS — C786 Secondary malignant neoplasm of retroperitoneum and peritoneum: Secondary | ICD-10-CM | POA: Diagnosis not present

## 2017-12-18 DIAGNOSIS — K56699 Other intestinal obstruction unspecified as to partial versus complete obstruction: Secondary | ICD-10-CM | POA: Diagnosis not present

## 2017-12-18 DIAGNOSIS — I2510 Atherosclerotic heart disease of native coronary artery without angina pectoris: Secondary | ICD-10-CM | POA: Diagnosis not present

## 2017-12-18 DIAGNOSIS — C562 Malignant neoplasm of left ovary: Secondary | ICD-10-CM | POA: Diagnosis not present

## 2017-12-18 DIAGNOSIS — C189 Malignant neoplasm of colon, unspecified: Secondary | ICD-10-CM | POA: Diagnosis not present

## 2017-12-18 DIAGNOSIS — I429 Cardiomyopathy, unspecified: Secondary | ICD-10-CM | POA: Diagnosis not present

## 2017-12-25 DIAGNOSIS — C189 Malignant neoplasm of colon, unspecified: Secondary | ICD-10-CM | POA: Diagnosis not present

## 2017-12-25 DIAGNOSIS — I2510 Atherosclerotic heart disease of native coronary artery without angina pectoris: Secondary | ICD-10-CM | POA: Diagnosis not present

## 2017-12-25 DIAGNOSIS — K56699 Other intestinal obstruction unspecified as to partial versus complete obstruction: Secondary | ICD-10-CM | POA: Diagnosis not present

## 2017-12-25 DIAGNOSIS — C786 Secondary malignant neoplasm of retroperitoneum and peritoneum: Secondary | ICD-10-CM | POA: Diagnosis not present

## 2017-12-25 DIAGNOSIS — C562 Malignant neoplasm of left ovary: Secondary | ICD-10-CM | POA: Diagnosis not present

## 2017-12-25 DIAGNOSIS — I429 Cardiomyopathy, unspecified: Secondary | ICD-10-CM | POA: Diagnosis not present

## 2017-12-30 ENCOUNTER — Other Ambulatory Visit: Payer: Self-pay | Admitting: *Deleted

## 2017-12-30 DIAGNOSIS — N838 Other noninflammatory disorders of ovary, fallopian tube and broad ligament: Secondary | ICD-10-CM

## 2017-12-30 DIAGNOSIS — N888 Other specified noninflammatory disorders of cervix uteri: Secondary | ICD-10-CM

## 2017-12-30 DIAGNOSIS — R1084 Generalized abdominal pain: Secondary | ICD-10-CM

## 2017-12-30 DIAGNOSIS — C562 Malignant neoplasm of left ovary: Secondary | ICD-10-CM

## 2017-12-30 MED ORDER — MORPHINE SULFATE 15 MG PO TABS: 15.0000 mg | ORAL_TABLET | ORAL | 0 refills | Status: AC | PRN

## 2018-01-02 DIAGNOSIS — C786 Secondary malignant neoplasm of retroperitoneum and peritoneum: Secondary | ICD-10-CM | POA: Diagnosis not present

## 2018-01-02 DIAGNOSIS — I429 Cardiomyopathy, unspecified: Secondary | ICD-10-CM | POA: Diagnosis not present

## 2018-01-02 DIAGNOSIS — C189 Malignant neoplasm of colon, unspecified: Secondary | ICD-10-CM | POA: Diagnosis not present

## 2018-01-02 DIAGNOSIS — K56699 Other intestinal obstruction unspecified as to partial versus complete obstruction: Secondary | ICD-10-CM | POA: Diagnosis not present

## 2018-01-02 DIAGNOSIS — I2510 Atherosclerotic heart disease of native coronary artery without angina pectoris: Secondary | ICD-10-CM | POA: Diagnosis not present

## 2018-01-02 DIAGNOSIS — C562 Malignant neoplasm of left ovary: Secondary | ICD-10-CM | POA: Diagnosis not present

## 2018-01-05 ENCOUNTER — Ambulatory Visit (HOSPITAL_COMMUNITY)
Admission: RE | Admit: 2018-01-05 | Discharge: 2018-01-05 | Disposition: A | Discharge status: Home / Self Care | Payer: Medicare Other | Source: Ambulatory Visit | Attending: Hematology and Oncology | Admitting: Hematology and Oncology

## 2018-01-05 ENCOUNTER — Inpatient Hospital Stay (HOSPITAL_BASED_OUTPATIENT_CLINIC_OR_DEPARTMENT_OTHER): Payer: Medicare Other | Admitting: Hematology and Oncology

## 2018-01-05 ENCOUNTER — Telehealth: Payer: Self-pay | Admitting: *Deleted

## 2018-01-05 ENCOUNTER — Encounter: Payer: Self-pay | Admitting: Hematology and Oncology

## 2018-01-05 ENCOUNTER — Inpatient Hospital Stay: Payer: Medicare Other | Attending: Gynecologic Oncology

## 2018-01-05 ENCOUNTER — Inpatient Hospital Stay: Payer: Medicare Other

## 2018-01-05 VITALS — BP 110/64 | HR 71 | Temp 98.0°F | Resp 18 | Ht 64.0 in | Wt 124.2 lb

## 2018-01-05 DIAGNOSIS — G620 Drug-induced polyneuropathy: Secondary | ICD-10-CM | POA: Insufficient documentation

## 2018-01-05 DIAGNOSIS — R188 Other ascites: Secondary | ICD-10-CM | POA: Insufficient documentation

## 2018-01-05 DIAGNOSIS — Z5111 Encounter for antineoplastic chemotherapy: Secondary | ICD-10-CM | POA: Diagnosis not present

## 2018-01-05 DIAGNOSIS — Z87891 Personal history of nicotine dependence: Secondary | ICD-10-CM | POA: Insufficient documentation

## 2018-01-05 DIAGNOSIS — I2510 Atherosclerotic heart disease of native coronary artery without angina pectoris: Secondary | ICD-10-CM | POA: Diagnosis not present

## 2018-01-05 DIAGNOSIS — T451X5A Adverse effect of antineoplastic and immunosuppressive drugs, initial encounter: Secondary | ICD-10-CM | POA: Diagnosis not present

## 2018-01-05 DIAGNOSIS — C801 Malignant (primary) neoplasm, unspecified: Secondary | ICD-10-CM

## 2018-01-05 DIAGNOSIS — C7982 Secondary malignant neoplasm of genital organs: Secondary | ICD-10-CM | POA: Insufficient documentation

## 2018-01-05 DIAGNOSIS — K5900 Constipation, unspecified: Secondary | ICD-10-CM | POA: Diagnosis not present

## 2018-01-05 DIAGNOSIS — Z66 Do not resuscitate: Secondary | ICD-10-CM | POA: Insufficient documentation

## 2018-01-05 DIAGNOSIS — C562 Malignant neoplasm of left ovary: Secondary | ICD-10-CM | POA: Diagnosis not present

## 2018-01-05 DIAGNOSIS — C786 Secondary malignant neoplasm of retroperitoneum and peritoneum: Secondary | ICD-10-CM | POA: Diagnosis not present

## 2018-01-05 DIAGNOSIS — D61818 Other pancytopenia: Secondary | ICD-10-CM | POA: Insufficient documentation

## 2018-01-05 DIAGNOSIS — Z7189 Other specified counseling: Secondary | ICD-10-CM

## 2018-01-05 DIAGNOSIS — R109 Unspecified abdominal pain: Secondary | ICD-10-CM | POA: Diagnosis not present

## 2018-01-05 LAB — CBC WITH DIFFERENTIAL (CANCER CENTER ONLY)
BASOS ABS: 0 10*3/uL (ref 0.0–0.1)
BASOS PCT: 0 %
Eosinophils Absolute: 0 10*3/uL (ref 0.0–0.5)
Eosinophils Relative: 0 %
HEMATOCRIT: 36.2 % (ref 34.8–46.6)
Hemoglobin: 12.3 g/dL (ref 11.6–15.9)
LYMPHS PCT: 16 %
Lymphs Abs: 0.5 10*3/uL — ABNORMAL LOW (ref 0.9–3.3)
MCH: 34.7 pg — ABNORMAL HIGH (ref 25.1–34.0)
MCHC: 34 g/dL (ref 31.5–36.0)
MCV: 102.3 fL — AB (ref 79.5–101.0)
Monocytes Absolute: 0 10*3/uL — ABNORMAL LOW (ref 0.1–0.9)
Monocytes Relative: 1 %
NEUTROS ABS: 2.7 10*3/uL (ref 1.5–6.5)
NEUTROS PCT: 83 %
Platelet Count: 138 10*3/uL — ABNORMAL LOW (ref 145–400)
RBC: 3.54 MIL/uL — AB (ref 3.70–5.45)
RDW: 14.5 % (ref 11.2–14.5)
WBC: 3.3 10*3/uL — AB (ref 3.9–10.3)

## 2018-01-05 LAB — CMP (CANCER CENTER ONLY)
ALT: 9 U/L (ref 0–44)
ANION GAP: 10 (ref 5–15)
AST: 12 U/L — AB (ref 15–41)
Albumin: 3.4 g/dL — ABNORMAL LOW (ref 3.5–5.0)
Alkaline Phosphatase: 103 U/L (ref 38–126)
BILIRUBIN TOTAL: 0.3 mg/dL (ref 0.3–1.2)
BUN: 17 mg/dL (ref 8–23)
CHLORIDE: 105 mmol/L (ref 98–111)
CO2: 26 mmol/L (ref 22–32)
Calcium: 9.5 mg/dL (ref 8.9–10.3)
Creatinine: 0.77 mg/dL (ref 0.44–1.00)
Glucose, Bld: 171 mg/dL — ABNORMAL HIGH (ref 70–99)
POTASSIUM: 4.6 mmol/L (ref 3.5–5.1)
Sodium: 141 mmol/L (ref 135–145)
TOTAL PROTEIN: 6.9 g/dL (ref 6.5–8.1)

## 2018-01-05 MED ORDER — FAMOTIDINE IN NACL 20-0.9 MG/50ML-% IV SOLN
20.0000 mg | Freq: Once | INTRAVENOUS | Status: AC
  Administered 2018-01-05: 20 mg via INTRAVENOUS

## 2018-01-05 MED ORDER — PALONOSETRON HCL INJECTION 0.25 MG/5ML
0.2500 mg | Freq: Once | INTRAVENOUS | Status: AC
  Administered 2018-01-05: 0.25 mg via INTRAVENOUS

## 2018-01-05 MED ORDER — SODIUM CHLORIDE 0.9 % IV SOLN
Freq: Once | INTRAVENOUS | Status: AC
  Administered 2018-01-05: 10:00:00 via INTRAVENOUS
  Filled 2018-01-05: qty 5

## 2018-01-05 MED ORDER — SODIUM CHLORIDE 0.9 % IV SOLN
Freq: Once | INTRAVENOUS | Status: AC
  Administered 2018-01-05: 10:00:00 via INTRAVENOUS
  Filled 2018-01-05: qty 250

## 2018-01-05 MED ORDER — FAMOTIDINE IN NACL 20-0.9 MG/50ML-% IV SOLN
INTRAVENOUS | Status: AC
  Filled 2018-01-05: qty 50

## 2018-01-05 MED ORDER — SODIUM CHLORIDE 0.9% FLUSH
10.0000 mL | INTRAVENOUS | Status: DC | PRN
  Administered 2018-01-05: 10 mL
  Filled 2018-01-05: qty 10

## 2018-01-05 MED ORDER — HEPARIN SOD (PORK) LOCK FLUSH 100 UNIT/ML IV SOLN
250.0000 [IU] | Freq: Once | INTRAVENOUS | Status: AC | PRN
  Administered 2018-01-05: 250 [IU]
  Filled 2018-01-05: qty 5

## 2018-01-05 MED ORDER — DEXAMETHASONE 4 MG PO TABS: ORAL_TABLET | ORAL | 0 refills | Status: AC

## 2018-01-05 MED ORDER — SODIUM CHLORIDE 0.9 % IV SOLN
420.0000 mg | Freq: Once | INTRAVENOUS | Status: AC
  Administered 2018-01-05: 420 mg via INTRAVENOUS
  Filled 2018-01-05: qty 42

## 2018-01-05 MED ORDER — DIPHENHYDRAMINE HCL 50 MG/ML IJ SOLN
50.0000 mg | Freq: Once | INTRAMUSCULAR | Status: AC
  Administered 2018-01-05: 50 mg via INTRAVENOUS

## 2018-01-05 MED ORDER — PALONOSETRON HCL INJECTION 0.25 MG/5ML
INTRAVENOUS | Status: AC
  Filled 2018-01-05: qty 5

## 2018-01-05 MED ORDER — SODIUM CHLORIDE 0.9 % IV SOLN
140.0000 mg/m2 | Freq: Once | INTRAVENOUS | Status: AC
  Administered 2018-01-05: 216 mg via INTRAVENOUS
  Filled 2018-01-05: qty 36

## 2018-01-05 MED ORDER — DIPHENHYDRAMINE HCL 50 MG/ML IJ SOLN
INTRAMUSCULAR | Status: AC
  Filled 2018-01-05: qty 1

## 2018-01-05 NOTE — Assessment & Plan Note (Addendum)
She has developed mild intermittent abdominal pain that she described as soreness She has some mild intermittent nausea Tumor markers pending We will proceed with treatment today but I ordered abdominal x-ray for assessment which showed significant constipation I favor completion of chemotherapy before repeat CT imaging However, if her symptoms does not improve or tumor marker becomes elevated, I will order CT scan sooner Based on our previous discussion at the GYN oncology tumor board, even though the sites of the pelvic mass appears larger, symptomatically, she was improving on chemotherapy My plan would be to continue to complete 6 cycles of treatment before repeat imaging study

## 2018-01-05 NOTE — Assessment & Plan Note (Signed)
She has minimum peripheral neuropathy affecting the bottom of her toes but not significant Monitor closely only for now

## 2018-01-05 NOTE — Assessment & Plan Note (Signed)
She has intermittent abdominal pain and frequent bowel movement X-ray of the abdomen showed significant fecal loading For now, I favor laxative therapy However, if the symptoms does not improve, I plan to repeat CT imaging for further evaluation

## 2018-01-05 NOTE — Telephone Encounter (Signed)
Called, spoke with the patient and moved her appt from 9/27 to tomorrow.

## 2018-01-05 NOTE — Assessment & Plan Note (Signed)
She has mild pancytopenia from treatment but not symptomatic We will observe for now.

## 2018-01-05 NOTE — Patient Instructions (Signed)
[facility] Discharge Instructions for Patients Receiving Chemotherapy  Today you received the following chemotherapy agents:  Taxol, Carboplatin  To help prevent nausea and vomiting after your treatment, we encourage you to take your nausea medication as prescribed.   If you develop nausea and vomiting that is not controlled by your nausea medication, call the clinic.   BELOW ARE SYMPTOMS THAT SHOULD BE REPORTED IMMEDIATELY:  *FEVER GREATER THAN 100.5 F  *CHILLS WITH OR WITHOUT FEVER  NAUSEA AND VOMITING THAT IS NOT CONTROLLED WITH YOUR NAUSEA MEDICATION  *UNUSUAL SHORTNESS OF BREATH  *UNUSUAL BRUISING OR BLEEDING  TENDERNESS IN MOUTH AND THROAT WITH OR WITHOUT PRESENCE OF ULCERS  *URINARY PROBLEMS  *BOWEL PROBLEMS  UNUSUAL RASH Items with * indicate a potential emergency and should be followed up as soon as possible.  Feel free to call the clinic should you have any questions or concerns. The clinic phone number is [phone].  Please show the CHEMO ALERT CARD at check-in to the Emergency Department and triage nurse.   

## 2018-01-05 NOTE — Progress Notes (Signed)
[place] OFFICE PROGRESS NOTE  Patient Care Team: Donzetta Kohut as PCP - General (Physician Assistant) [name], MD as Consulting Physician (Obstetrics and Gynecology) Mauri Pole, MD as Consulting Physician (Gastroenterology) Belva Crome, MD as Consulting Physician (Cardiology)  ASSESSMENT & PLAN:  Disseminated ovarian cancer, left [facility]) She has developed mild intermittent abdominal pain that she described as soreness She has some mild intermittent nausea Tumor markers pending We will proceed with treatment today but I ordered abdominal x-ray for assessment which showed significant constipation I favor completion of chemotherapy before repeat CT imaging However, if her symptoms does not improve or tumor marker becomes elevated, I will order CT scan sooner Based on our previous discussion at the GYN oncology tumor board, even though the sites of the pelvic mass appears larger, symptomatically, she was improving on chemotherapy My plan would be to continue to complete 6 cycles of treatment before repeat imaging study  Peritoneal carcinomatosis [facility]) She has intermittent abdominal pain and frequent bowel movement X-ray of the abdomen showed significant fecal loading For now, I favor laxative therapy However, if the symptoms does not improve, I plan to repeat CT imaging for further evaluation  Pancytopenia, acquired [facility]) She has mild pancytopenia from treatment but not symptomatic We will observe for now.  Peripheral neuropathy due to chemotherapy [facility]) She has minimum peripheral neuropathy affecting the bottom of her toes but not significant Monitor closely only for now   Orders Placed This Encounter  Procedures  . DG Abd 2 Views    Standing Status:   Future    Number of Occurrences:   1    Standing Expiration Date:   02/09/2019    Order Specific Question:   Reason for exam:    Answer:   constipation, abdominal pain    Order  Specific Question:   Preferred imaging location?    Answer:   [facility]    INTERVAL HISTORY: Please see below for problem oriented charting. She returns for cycle 5 of chemotherapy Since the last time I saw her, her symptoms are stable She has not lost weight She had occasional nausea She also complained of some symptoms of abdominal wall soreness She denies constipation She is able to get her bowel movement regulated with laxatives She denies worsening peripheral neuropathy No recent infection, fever or chills The patient denies any recent signs or symptoms of bleeding such as spontaneous epistaxis, hematuria or hematochezia.   SUMMARY OF ONCOLOGIC HISTORY: Oncology History   MSI stable     Disseminated ovarian cancer, left ([place])   12/17/2016 Imaging    US pelvis 1. There appears to be a 2.2 x 1.9 x 2.0 cm mass centered in the cervix, likely extending into the lower uterus. Recommend correlation with physical exam. An MRI could better evaluate. 2. Thickened endometrium measuring 12 mm. Recommend gynecologic consultation and endometrial sampling given patient age. 3. Probable fibroid in the uterus.    12/27/2016 Pathology Results    Endocervix, curettage - BENIGN ENDOCERVICAL MUCOSA AND BENIGN SQUAMOUS MUCOSA    01/01/2017 Imaging    MRI pelvis 1. No classic intermediate to high signal intensity tissue disrupting the hypointense fibrous cervical stroma as typically seen in the setting of cervical carcinoma. However, there is asymmetric thickening of the posterior cervix with anterior displacement of the cervical canal and associated widening and heterogeneity of the endometrial cavity. As such, imaging features are indeterminate by MRI and close follow-up or tissue sampling is  recommended. 2. Apparent areas of tethering/scarring between the sigmoid colon posterior uterus/cervix. Diverticuli are noted in the sigmoid colon and this may be related to scarring from prior bouts  is diverticulitis. 3. Small volume intraperitoneal free fluid.    01/28/2017 Pathology Results    1. Cervix, biopsy, posterior - SPINDLE CELL LESION CONSISTENT WITH SMOOTH MUSCLE NEOPLASM, SEE COMMENT. 2. Endometrium, curettage - BENIGN SQUAMOUS MUCOSA. - BENIGN SMOOTH MUSCLE. - NO ENDOMETRIUM PRESENT. Microscopic Comment 1. There is a spindle cell lesion with focal atypia. There is no necrosis or increase in mitotic figures. The sample is somewhat limited, but the differential includes a cellular leiomyoma.    01/28/2017 Surgery    Surgery: ultrasound guided dilation of cervix with D&C and cervical biopsy  Surgeons:  Donaciano Eva, MD  Operative findings: 6cm uterus, dilated endometrial cavity, very stenotic cervical os. Thickened, fibrotic cervix without discrete mass, grossly normal cervical mucosa.       03/22/2017 Imaging    CT abdomen and pelvis 1. Diffuse prominent fluid-filled small bowel with mesenteric edema, enteritis pattern. This may be infectious or inflammatory. There is no evidence of obstruction. 2. Mild omental stranding in the left abdomen likely reactive secondary to primary bowel process, omental caking is not entirely excluded. 3. Sigmoid colonic diverticulosis without diverticulitis. 4. Prominent heterogeneous endometrium, recommend correlation with recent D and C. 5.  Aortic Atherosclerosis (ICD10-I70.0).    06/26/2017 Imaging    US pelvis T/V images. Anteverted and heterogenous uterus measuring 5.85 x 3.87 x 3.36 cm. Right subserous fibroid with calcifications measuring 2.7 x 2.6 cm. Endometrial line appears thickened at 8.1 mm. Cervix appears normal, no mass seen. Right ovary not seen. Left adnexal mass cystic/solid measuring 7.6 x 6.0 x 7.1 cm. Positive color flow Doppler.No free fluid in the posterior cul-de-sac.    06/26/2017 Tumor Marker    Patient's tumor was tested for the following markers: CA-125 Results of the tumor marker test  revealed 90    06/26/2017 Tumor Marker    Patient's tumor was tested for the following markers: CEA Results of the tumor marker test revealed 4.5    06/27/2017 Imaging    Ct abdomen and pelvis New 7.4 cm left adnexal mass with worsening abdominal ascites and omental nodularity, suggesting metastatic ovarian carcinoma.    07/02/2017 Pathology Results    Cervix, biopsy, ectocervix - SQUAMOUS MUCOSA WITH ATYPICAL INFILTRATE WITHIN FIBROMUSCULAR STROMA. - SEE MICROSCOPIC DESCRIPTION. Microscopic Comment There is a small fragment of squamous mucosa with a small portion of benign squamous epithelium. Within the fibromuscular stroma there are strands and nests of epithelioid cells, which have an infiltrating pattern and there are a few with cytoplasmic vacuoles. The findings are worrisome for an infiltrating neoplastic process and additional tissue may be indicated. There is insufficient tissue remaining in the current biopsy for additional studies.    07/11/2017 Procedure    CT-guided biopsy of the peritoneal thickening in the left lateral abdomen. Procedure was technically challenging due to the small amount of peritoneal thickening and close proximity of multiple bowel loops. False negative biopsy is possible.    07/11/2017 Pathology Results    Soft Tissue Needle Core Biopsy, omentum - ADENOCARCINOMA. - SEE COMMENT. Microscopic Comment The malignant cells are positive for CDX-2, cytokeratin 20, and p53. They are negative for cytokeratin 7, estrogen receptor, and PAX-8. This immunohistochemical profile argues against a gynecologic primary, and the differential diagnosis includes gastrointestinal primary.    07/12/2017 Imaging    Ct abdomen and pelvis  1. New small bowel obstruction with transition point seen in the right lower quadrant, possibly secondary to adhesions related to peritoneal carcinomatosis. No pneumatosis. 2. Findings suggestive of metastatic ovarian cancer. Slightly worsened abdominal  ascites may be related to metastatic disease or reactive to the small-bowel obstruction. 3. Expected post biopsy changes in the left lower quadrant adjacent to peritoneal nodularity. No intra-abdominal hemorrhage.    07/12/2017 - 07/14/2017 Hospital Admission    She was admitted for management of subacute bowel obstruction    07/15/2017 Cancer Staging    Staging form: Ovary, Fallopian Tube, and Primary Peritoneal Carcinoma, AJCC 8th Edition - Clinical: cT3, cN0, cM0 - Signed by Heath Lark, MD on 10/09/2017    07/18/2017 Procedure    Successful placement of a right arm PICC with sonographic and fluoroscopic guidance. The catheter is ready for use.    08/04/2017 Imaging    The previously noted ascites in the abdomen and pelvis has resolved. 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  No small bowel or colonic obstruction is identified.  Generalized thickened bowel wall of the sigmoid colon.  Mixed cystic and solid mass of the left ovary is unchanged.  Status post prior cholecystectomy with mild postsurgical intrahepatic biliary ductal dilatation.     09/29/2017 Imaging    1. Interval resolution of small bowel obstruction. 2. Persistent findings of peritoneal carcinomatosis with improvement in ascites and probable improvement in the previously demonstrated ill-defined right lower quadrant mass. 3. Grossly stable complex left adnexal mass from recent prior studies. This was first demonstrated on the exam done 06/27/2017 and has enlarged since then, consistent with an ovarian metastasis. This abuts the sigmoid colon and may invade it. 4. Bladder wall thickening and surrounding soft tissue stranding consistent with cystitis, possibly treatment related.     10/06/2017 Imaging    Incomplete barium enema due to severe, near complete obstruction of an irregular process in the sigmoid colon. The irregularity of this lesion is most concerning for colon cancer.     10/09/2017 Tumor Marker    Patient's tumor was tested for the following markers: CA-125 Results of the tumor marker test revealed 29.4    10/10/2017 -  Chemotherapy    The patient had carboplatin and Taxol    10/31/2017 Tumor Marker    Patient's tumor was tested for the following markers: CA-125 Results of the tumor marker test revealed 27.2    11/21/2017 Tumor Marker    Patient's tumor was tested for the following markers: CA-125 Results of the tumor marker test revealed 25.6    12/12/2017 Imaging    1. Interval increase in size of complex left adnexal mass. 2. Similar-appearing peritoneal carcinomatosis and small volume ascites.     REVIEW OF SYSTEMS:   Constitutional: Denies fevers, chills or abnormal weight loss Eyes: Denies blurriness of vision Ears, nose, mouth, throat, and face: Denies mucositis or sore throat Respiratory: Denies cough, dyspnea or wheezes Cardiovascular: Denies palpitation, chest discomfort or lower extremity swelling Skin: Denies abnormal skin rashes Lymphatics: Denies new lymphadenopathy or easy bruising Neurological:Denies numbness, tingling or new weaknesses Behavioral/Psych: Mood is stable, no new changes  All other systems were reviewed with the patient and are negative.  I have reviewed the past medical history, past surgical history, social history and family history with the patient and they are unchanged from previous note.  ALLERGIES:  is allergic to flagyl [metronidazole].  MEDICATIONS:  Current Outpatient Medications  Medication Sig Dispense Refill  . aspirin EC 81  MG tablet Take 81 mg by mouth daily.    Marland Kitchen dexamethasone (DECADRON) 4 MG tablet Take 5 tabs at the night before and 5 tab the morning of chemotherapy, every 3 weeks, by mouth 60 tablet 0  . FLUoxetine (PROZAC) 20 MG capsule Take 1 capsule (20 mg total) by mouth daily. 90 capsule 1  . LORazepam (ATIVAN) 1 MG tablet Take 1 tablet (1 mg total) by mouth every 8 (eight) hours as needed  for anxiety or sleep. 90 tablet 1  . mirtazapine (REMERON) 15 MG tablet Take 1 tablet (15 mg total) by mouth at bedtime. 30 tablet 1  . morphine (MSIR) 15 MG tablet Take 1 tablet (15 mg total) by mouth every 4 (four) hours as needed for severe pain. 90 tablet 0  . ondansetron (ZOFRAN) 8 MG tablet Take 1 tablet (8 mg total) by mouth every 8 (eight) hours as needed for nausea. 30 tablet 3  . prochlorperazine (COMPAZINE) 10 MG tablet Take 1 tablet (10 mg total) by mouth every 6 (six) hours as needed for nausea or vomiting. 30 tablet 1  . sennosides-docusate sodium (SENOKOT-S) 8.6-50 MG tablet Take 2-4 tablets by mouth 2 (two) times daily.     Current Facility-Administered Medications  Medication Dose Route Frequency Provider Last Rate Last Dose  . 0.9 %  sodium chloride infusion  500 mL Intravenous Once Nandigam, Venia Minks, MD       Facility-Administered Medications Ordered in Other Visits  Medication Dose Route Frequency Provider Last Rate Last Dose  . sodium chloride flush (NS) 0.9 % injection 10 mL  10 mL Intracatheter PRN Alvy Bimler, Kaye Luoma, MD   10 mL at 01/05/18 1438    PHYSICAL EXAMINATION: ECOG PERFORMANCE STATUS: 2 - Symptomatic, <50% confined to bed  Vitals:   01/05/18 0903  BP: 110/64  Pulse: 71  Resp: 18  Temp: 98 F (36.7 C)  SpO2: 99%   Filed Weights   01/05/18 0903  Weight: 124 lb 3.2 oz (56.3 kg)    GENERAL:alert, no distress and comfortable SKIN: skin color, texture, turgor are normal, no rashes or significant lesions EYES: normal, Conjunctiva are pink and non-injected, sclera clear OROPHARYNX:no exudate, no erythema and lips, buccal mucosa, and tongue normal  NECK: supple, thyroid normal size, non-tender, without nodularity LYMPH:  no palpable lymphadenopathy in the cervical, axillary or inguinal LUNGS: clear to auscultation and percussion with normal breathing effort HEART: regular rate & rhythm and no murmurs and no lower extremity edema ABDOMEN:abdomen soft,  minimum tenderness on palpation without rebound or guarding.  Active bowel sounds are noted Musculoskeletal:no cyanosis of digits and no clubbing  NEURO: alert & oriented x 3 with fluent speech, no focal motor/sensory deficits  LABORATORY DATA:  I have reviewed the data as listed    Component Value Date/Time   NA 141 01/05/2018 0837   NA 143 09/26/2017   K 4.6 01/05/2018 0837   CL 105 01/05/2018 0837   CO2 26 01/05/2018 0837   GLUCOSE 171 (H) 01/05/2018 0837   BUN 17 01/05/2018 0837   BUN 20 09/26/2017   CREATININE 0.77 01/05/2018 0837   CALCIUM 9.5 01/05/2018 0837   PROT 6.9 01/05/2018 0837   PROT 6.7 11/28/2016 0849   ALBUMIN 3.4 (L) 01/05/2018 0837   ALBUMIN 4.3 11/28/2016 0849   AST 12 (L) 01/05/2018 0837   ALT 9 01/05/2018 0837   ALKPHOS 103 01/05/2018 0837   BILITOT 0.3 01/05/2018 0837   GFRNONAA >60 01/05/2018 0837   GFRAA >60 01/05/2018  9147    No results found for: SPEP, UPEP  Lab Results  Component Value Date   WBC 3.3 (L) 01/05/2018   NEUTROABS 2.7 01/05/2018   HGB 12.3 01/05/2018   HCT 36.2 01/05/2018   MCV 102.3 (H) 01/05/2018   PLT 138 (L) 01/05/2018      Chemistry      Component Value Date/Time   NA 141 01/05/2018 0837   NA 143 09/26/2017   K 4.6 01/05/2018 0837   CL 105 01/05/2018 0837   CO2 26 01/05/2018 0837   BUN 17 01/05/2018 0837   BUN 20 09/26/2017   CREATININE 0.77 01/05/2018 0837   GLU 92 09/26/2017      Component Value Date/Time   CALCIUM 9.5 01/05/2018 0837   ALKPHOS 103 01/05/2018 0837   AST 12 (L) 01/05/2018 0837   ALT 9 01/05/2018 0837   BILITOT 0.3 01/05/2018 0837       RADIOGRAPHIC STUDIES: I have personally reviewed the radiological images as listed and agreed with the findings in the report. Ct Chest W Contrast  Result Date: 12/12/2017 CLINICAL DATA:  Patient with history of ovarian carcinoma. Follow-up exam. EXAM: CT CHEST, ABDOMEN, AND PELVIS WITH CONTRAST TECHNIQUE: Multidetector CT imaging of the chest, abdomen  and pelvis was performed following the standard protocol during bolus administration of intravenous contrast. CONTRAST:  137m OMNIPAQUE IOHEXOL 300 MG/ML  SOLN COMPARISON:  Abdomen pelvic CT 09/29/2017; chest CT 08/12/2006 FINDINGS: CT CHEST FINDINGS Cardiovascular: Normal heart size. Thoracic aortic and coronary arterial vascular calcifications. Small fluid superior pericardial recess. Mediastinum/Nodes: No enlarged axillary, mediastinal or hilar lymphadenopathy. Normal appearance of the esophagus. Lungs/Pleura: Central airways are patent. Dependent atelectasis within the bilateral lower lobes. No large area of pulmonary consolidation. No pleural effusion or pneumothorax. Musculoskeletal: Thoracic spine degenerative changes. No aggressive or acute appearing osseous lesions. CT ABDOMEN PELVIS FINDINGS Hepatobiliary: Liver is normal in size and contour. No focal hepatic lesion is identified. Patient status post cholecystectomy. No intrahepatic or extrahepatic biliary ductal dilatation. Pancreas: Unremarkable Spleen: Unremarkable Adrenals/Urinary Tract: Adrenal glands are normal. Kidneys enhance symmetrically with contrast. Simple cyst superior pole left kidney. No hydronephrosis. Urinary bladder is unremarkable. Stomach/Bowel: Oral contrast material to the level of the rectum. No evidence for bowel obstruction. Vascular/Lymphatic: Normal caliber abdominal aorta. Peripheral calcified atherosclerotic plaque. No retroperitoneal. Reproductive: Interval increase in size complex cystic and solid left adnexal mass measuring 5.0 x 5.0 cm (image 97; series 2), previously 4.3 x 3.9 cm. Mass measures up to 9 cm in craniocaudal dimension on coronal imaging. Mass abuts the sigmoid colon. Other: Similar-appearing omental carcinomatosis peritoneal nodularity. Small amount of perihepatic fluid. No free intraperitoneal air. Musculoskeletal: lumbar spine degenerative changes. No aggressive or acute appearing osseous lesions.  IMPRESSION: 1. Interval increase in size of complex left adnexal mass. 2. Similar-appearing peritoneal carcinomatosis and small volume ascites. Electronically Signed   By: DLovey NewcomerM.D.   On: 12/12/2017 12:03   Ct Abdomen Pelvis W Contrast  Result Date: 12/12/2017 CLINICAL DATA:  Patient with history of ovarian carcinoma. Follow-up exam. EXAM: CT CHEST, ABDOMEN, AND PELVIS WITH CONTRAST TECHNIQUE: Multidetector CT imaging of the chest, abdomen and pelvis was performed following the standard protocol during bolus administration of intravenous contrast. CONTRAST:  1048mOMNIPAQUE IOHEXOL 300 MG/ML  SOLN COMPARISON:  Abdomen pelvic CT 09/29/2017; chest CT 08/12/2006 FINDINGS: CT CHEST FINDINGS Cardiovascular: Normal heart size. Thoracic aortic and coronary arterial vascular calcifications. Small fluid superior pericardial recess. Mediastinum/Nodes: No enlarged axillary, mediastinal or hilar lymphadenopathy. Normal  appearance of the esophagus. Lungs/Pleura: Central airways are patent. Dependent atelectasis within the bilateral lower lobes. No large area of pulmonary consolidation. No pleural effusion or pneumothorax. Musculoskeletal: Thoracic spine degenerative changes. No aggressive or acute appearing osseous lesions. CT ABDOMEN PELVIS FINDINGS Hepatobiliary: Liver is normal in size and contour. No focal hepatic lesion is identified. Patient status post cholecystectomy. No intrahepatic or extrahepatic biliary ductal dilatation. Pancreas: Unremarkable Spleen: Unremarkable Adrenals/Urinary Tract: Adrenal glands are normal. Kidneys enhance symmetrically with contrast. Simple cyst superior pole left kidney. No hydronephrosis. Urinary bladder is unremarkable. Stomach/Bowel: Oral contrast material to the level of the rectum. No evidence for bowel obstruction. Vascular/Lymphatic: Normal caliber abdominal aorta. Peripheral calcified atherosclerotic plaque. No retroperitoneal. Reproductive: Interval increase in size  complex cystic and solid left adnexal mass measuring 5.0 x 5.0 cm (image 97; series 2), previously 4.3 x 3.9 cm. Mass measures up to 9 cm in craniocaudal dimension on coronal imaging. Mass abuts the sigmoid colon. Other: Similar-appearing omental carcinomatosis peritoneal nodularity. Small amount of perihepatic fluid. No free intraperitoneal air. Musculoskeletal: lumbar spine degenerative changes. No aggressive or acute appearing osseous lesions. IMPRESSION: 1. Interval increase in size of complex left adnexal mass. 2. Similar-appearing peritoneal carcinomatosis and small volume ascites. Electronically Signed   By: Lovey Newcomer M.D.   On: 12/12/2017 12:03   Dg Abd 2 Views  Result Date: 01/05/2018 CLINICAL DATA:  Constipation, abdominal pain. Recent nausea with chemotherapy for ovarian malignancy. No current complaints. EXAM: ABDOMEN - 2 VIEW COMPARISON:  Abdominal and pelvic CT scan of December 12, 2017 FINDINGS: The colonic stool burden is increased diffusely. There is no small or large bowel obstructive pattern. The bony structures exhibit no acute abnormalities. There are no abnormal soft tissue calcifications. There are surgical clips in the gallbladder fossa. IMPRESSION: Increased colonic stool burden is consistent with constipation in the appropriate clinical setting. No evidence of ileus or obstruction. Electronically Signed   By: David  Martinique M.D.   On: 01/05/2018 15:07    All questions were answered. The patient knows to call the clinic with any problems, questions or concerns. No barriers to learning was detected.  I spent 30 minutes counseling the patient face to face. The total time spent in the appointment was 40 minutes and more than 50% was on counseling and review of test results  Heath Lark, MD 01/05/2018 5:29 PM

## 2018-01-06 ENCOUNTER — Inpatient Hospital Stay (HOSPITAL_BASED_OUTPATIENT_CLINIC_OR_DEPARTMENT_OTHER): Payer: Medicare Other | Admitting: Gynecologic Oncology

## 2018-01-06 ENCOUNTER — Telehealth: Payer: Self-pay | Admitting: Hematology and Oncology

## 2018-01-06 ENCOUNTER — Other Ambulatory Visit: Payer: Self-pay | Admitting: Hematology and Oncology

## 2018-01-06 ENCOUNTER — Encounter: Payer: Self-pay | Admitting: Gynecologic Oncology

## 2018-01-06 VITALS — BP 120/68 | HR 66 | Temp 98.4°F | Resp 18 | Ht 64.0 in | Wt 124.2 lb

## 2018-01-06 DIAGNOSIS — C7982 Secondary malignant neoplasm of genital organs: Secondary | ICD-10-CM

## 2018-01-06 DIAGNOSIS — C786 Secondary malignant neoplasm of retroperitoneum and peritoneum: Secondary | ICD-10-CM | POA: Diagnosis not present

## 2018-01-06 DIAGNOSIS — C562 Malignant neoplasm of left ovary: Secondary | ICD-10-CM | POA: Diagnosis not present

## 2018-01-06 DIAGNOSIS — R188 Other ascites: Secondary | ICD-10-CM

## 2018-01-06 DIAGNOSIS — Z5111 Encounter for antineoplastic chemotherapy: Secondary | ICD-10-CM | POA: Diagnosis not present

## 2018-01-06 DIAGNOSIS — Z87891 Personal history of nicotine dependence: Secondary | ICD-10-CM | POA: Diagnosis not present

## 2018-01-06 DIAGNOSIS — Z66 Do not resuscitate: Secondary | ICD-10-CM | POA: Diagnosis not present

## 2018-01-06 DIAGNOSIS — N888 Other specified noninflammatory disorders of cervix uteri: Secondary | ICD-10-CM

## 2018-01-06 DIAGNOSIS — D61818 Other pancytopenia: Secondary | ICD-10-CM | POA: Diagnosis not present

## 2018-01-06 LAB — CA 125: CANCER ANTIGEN (CA) 125: 44.1 U/mL — AB (ref 0.0–38.1)

## 2018-01-06 NOTE — Patient Instructions (Signed)
Follow up with Dr Alvy Bimler as discussed and have CT -see appointment below.

## 2018-01-06 NOTE — Telephone Encounter (Signed)
Her tumor marker just came back this morning and it becomes elevated compared to her most recent baseline. I am concerned about her having disease progression. I would recommend CT scan of the abdomen and pelvis for staging I will coordinate her appointment with GYN oncologist My preference would be to proceed with CT imaging first before she sees her GYN oncologist.

## 2018-01-06 NOTE — Progress Notes (Signed)
Consult Note: Gyn-Onc  Consult was requested by Dr. Dellis Filbert for the evaluation of Carol Ferguson 67 y.o. female  CC:  Chief Complaint  Patient presents with  . Cervical mass    Assessment/Plan:  Carol Ferguson  is a 67 y.o.  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  She appears to no longer be stable on carb/taxol. Recommend following up with CT scan. If there is measurable progression, would consider changing to bevacizumab containing doublet therapy.  I had an extensive discussion with Carol Ferguson today about her prognosis. We discussed that she does not have curable cancer. I explained that surgery will not be able to "cut out" her metastatic cancer, and, in the setting of platinum resistance, plays a limited role, if any.  We discussed anticipated course of her illness and preparation for death. She is DNR. She is interested in inpatient hospice, but not at the location at which she was cared for before.   HPI: Carol Ferguson is a very pleasant 67 year old P0 who is seen in consultation at the request of Dr Dellis Filbert for a cervical mass.  The patient has a long history of smoking. Her last pap was in 2012 and was normal.  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 This was treated with a stent in the circumflex artery ([employer], Resolute onyx zotarolimus-eluting stent).  She has excellent cardiac function postprocedure and has resumed normal activities, though had quit smoking June, 2018.  The patient began developing right lower quadrant discomfort in July, 2018 with intermittent sharp shooting pains. She reported this to [name], her PCP who performed a TVUS which showed a uterus measuring 7.2x3.5x4.1cm with a small mass in the lower uterine segment measuring 36m (likely small fibroid). There appears to be a mass centered in the cervix extending into the lower uterine body measuring 2.2x2x1.9cm. The endometrium measures  164m The ovaries were normal.  She denies bleeding.   Biopsy was attempted on 12/18/16 which was unsuccessful due to patient discomfort. Pap was normal with no HR HPV detected.   MRI pelvis 01/01/17 revealed a uterus measuring 6.3 x 3.6 x 4.2 cm. Endometrial cavity appears thickened and irregular measuring up to 15 mm AP diameter. The cervix appears thickened, mainly posteriorly, but there is no T2 intermediate to high signal intensity tissue disrupting the typically low signal intensity fibrous cervical stroma. The cervical canal appears displaced anteriorly by the posterior cervical mass-effect. Postcontrast imaging shows bandlike areas of probable scarring or tethering between the sigmoid colon in the posterior lower uterine segment and cervix. No findings to suggest grossly abnormal parametrial soft tissue. No adnexal mass. Right ovary measures about 2.3 x 2.4 x 1.7 cm. Left ovary measures about 2.6 x 2.2 x 2.1 cm.  On 01/28/17 she went to the operating room for cervical dilation under [place]idance, cervical biopsy and D&C. Her cervix was firm but with no gross visible lesions.   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 The D&C showed benign squamous mucosa and benign smooth muscle.  Postoperative she was recommended to have serial US's at 3 monthly intervals to monitor for change to the cervical mass to determine the natural history. If the mass grew, the plan was to resect it with hysterectomy, given that she would be more remote from MI (and able to safely come  off brilenta).   Interval Hx:  She continued to have abdominal pain and was seen in the ER on 03/22/18. CT abdomen/pelvis was performed and demonstrated: Diffuse prominent fluid-filled small bowel with mesenteric edema, enteritis pattern. This may be infectious or inflammatory. There is no evidence of  obstruction. Mild omental stranding in the left abdomen likely reactive secondary to primary bowel process, omental caking is not entirely excluded. Sigmoid colonic diverticulosis without diverticulitis. Prominent heterogeneous endometrium, recommend correlation with recent D and C.  She was seen by gastroenterology. She reported difficulty eating, early satiety, and pains in the abdomen. Given that her symptoms persisted, the gastroenterologist ordered a follow-up CT on 06/27/17 showed Uterus and right adnexal region unremarkable. 7.4 cm mixed solid/cystic left adnexal mass, new since prior exam. There is a small amount of perihepatic and lower peritoneal ascites, slightly increased since prior. Progressive omental nodularity in the left lower quadrant and anterior right mid Abdomen.  She had a scheduled [place] (had been previously scheduled) on 06/26/17 and showed: Anteverted and heterogenous uterus measuring 5.85 x 3.87 x 3.36 cm. Right subserous fibroid with calcifications measuring 2.7 x 2.6 cm. Endometrial line appears thickened at 8.1 mm. Cervix appears normal, no mass seen. Right ovary not seen. Left adnexal mass cystic/solid measuring 7.6 x 6.0 x 7.1 cm. Positive color flow Doppler.No free fluid in the posterior cul-de-sac.  CA 125 was drawn on 06/27/17 and was elevated at 90. CEA was 4.5.  Interval Hx: Exam on 07/02/17 showed extensive infiltration of the cervical stroma and anterior vaginal wall with tumor clinically, though on biopsy only squamous mucosa with atypical infiltrate was seen.  She was then biopsied at the omentum on 07/11/17 which showed adenocarcinoma cells positive for CDX-2, cytokeratin 20 and p53. Negative for CK 7 and ER and PAX-8.  It was presumed that she had a primary GI tumor and was started on FOLFOX.  After 2 cycles she manifested progression with SBO/tumor ileus. This was refractory to conservative therapy and she was discharged to hospice. However, her obstruction  resolved and she felt better and was discharged from hospice.  She then received carboplatin and paclitaxel given the CT imaging consistent with an ovarian pattern of disease despite pathologic findings suggesting otherwise.   She normalized her CA 125 on this regimen, however on imaging, disease remained fairly stable and the left ovarian mass remained stable.   She is tolerating the chemotherapy well and gaining weight. However, on 01/05/18 her CA 125 increased to 44 from within normal limits.   Current Meds:  Outpatient Encounter Medications as of 01/06/2018  Medication Sig  . aspirin EC 81 MG tablet Take 81 mg by mouth daily.  Marland Kitchen dexamethasone (DECADRON) 4 MG tablet Take 5 tabs at the night before and 5 tab the morning of chemotherapy, every 3 weeks, by mouth  . FLUoxetine (PROZAC) 20 MG capsule Take 1 capsule (20 mg total) by mouth daily.  Marland Kitchen LORazepam (ATIVAN) 1 MG tablet Take 1 tablet (1 mg total) by mouth every 8 (eight) hours as needed for anxiety or sleep.  . mirtazapine (REMERON) 15 MG tablet Take 1 tablet (15 mg total) by mouth at bedtime.  Marland Kitchen morphine (MSIR) 15 MG tablet Take 1 tablet (15 mg total) by mouth every 4 (four) hours as needed for severe pain.  Marland Kitchen ondansetron (ZOFRAN) 8 MG tablet Take 1 tablet (8 mg total) by mouth every 8 (eight) hours as needed for nausea.  . prochlorperazine (COMPAZINE) 10 MG tablet Take 1 tablet (  10 mg total) by mouth every 6 (six) hours as needed for nausea or vomiting.  . sennosides-docusate sodium (SENOKOT-S) 8.6-50 MG tablet Take 2-4 tablets by mouth 2 (two) times daily.   Facility-Administered Encounter Medications as of 01/06/2018  Medication  . 0.9 %  sodium chloride infusion    Allergy:  Allergies  Allergen Reactions  . Flagyl [Metronidazole] Nausea Only    Social Hx:   Social History   Socioeconomic History  . Marital status: Divorced    Spouse name: Not on file  . Number of children: 0  . Years of education: Not on file  .  Highest education level: Not on file  Occupational History  . Occupation: retired  Scientific laboratory technician  . Financial resource strain: Not on file  . Food insecurity:    Worry: Not on file    Inability: Not on file  . Transportation needs:    Medical: Not on file    Non-medical: Not on file  Tobacco Use  . Smoking status: Former Smoker    Packs/day: 1.00    Years: 35.00    Pack years: 35.00    Types: Cigarettes    Last attempt to quit: 09/26/2016    Years since quitting: 1.2  . Smokeless tobacco: Never Used  Substance and Sexual Activity  . Alcohol use: Not Currently  . Drug use: No  . Sexual activity: Never  Lifestyle  . Physical activity:    Days per week: Not on file    Minutes per session: Not on file  . Stress: Not on file  Relationships  . Social connections:    Talks on phone: Not on file    Gets together: Not on file    Attends religious service: Not on file    Active member of club or organization: Not on file    Attends meetings of clubs or organizations: Not on file    Relationship status: Not on file  . Intimate partner violence:    Fear of current or ex partner: Not on file    Emotionally abused: Not on file    Physically abused: Not on file    Forced sexual activity: Not on file  Other Topics Concern  . Not on file  Social History Narrative  . Not on file    Past Surgical Hx:  Past Surgical History:  Procedure Laterality Date  . CERVICAL CONIZATION W/BX N/A 01/28/2017   Procedure: CONIZATION CERVIX WITH BIOPSY;  Surgeon: [name], MD;  Location: [facility];  Service: Gynecology;  Laterality: N/A;  . CHOLECYSTECTOMY    . CORONARY STENT INTERVENTION N/A 09/26/2016   Procedure: Coronary Stent Intervention;  Surgeon: Belva Crome, MD;  Location: [facility];  Service: Cardiovascular;  Laterality: N/A;  . DILATION AND CURETTAGE OF UTERUS N/A 01/28/2017   Procedure: DILATATION AND CURETTAGE WITH ULTRASOUND GUIDENCE;  Surgeon: [name],  MD;  Location: [facility];  Service: Gynecology;  Laterality: N/A;  . LEFT HEART CATH AND CORONARY ANGIOGRAPHY N/A 09/26/2016   Procedure: Left Heart Cath and Coronary Angiography;  Surgeon: Belva Crome, MD;  Location: [facility];  Service: Cardiovascular;  Laterality: N/A;    Past Medical Hx:  Past Medical History:  Diagnosis Date  . Anemia   . Anxiety   . 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 CAD (coronary artery disease)    a. Inf STEMI/LHC 09/2016 which showed occluded mid LCx treated  with PCI, DES; remaining cath details included a proximal to mid LAD lesion of 20% stenosis, and proximal to mid RCA 25% stenosis. EF 55-60%.  . Cancer ([place])   . Clotting disorder ([place])   . Depression   . Diverticulosis   . Gallstones   . Ischemic cardiomyopathy    a. 2D Echo 09/28/16 showed EF 55-60%, probable severe hypokinesis of the entire inferiormyocardium, normal diastolic parameters.  . Myocardial infarction ([place]) 09/26/2016  . Pneumonia   . Presence of tooth-root and mandibular implants   . Tobacco abuse   . Wears glasses     Past Gynecological History:  Nulliparous, menopause age 65 No LMP recorded. Patient is postmenopausal.  Family Hx:  Family History  Problem Relation Age of Onset  . Cancer Brother        MELANOMA  . Heart disease Father   . Stroke Father   . Heart disease Brother   . Diabetes Brother   . Heart attack Brother   . Heart failure Brother   . Esophageal cancer Maternal Aunt   . Colon cancer Neg Hx   . Liver cancer Neg Hx   . Pancreatic cancer Neg Hx   . Rectal cancer Neg Hx   . Stomach cancer Neg Hx     Review of Systems:  Constitutional  Feels well,    ENT Normal appearing ears and nares bilaterally Skin/Breast  No rash, sores, jaundice, itching, dryness Cardiovascular  No chest pain, shortness of breath, or edema  Pulmonary  No cough or wheeze.  Gastro Intestinal  No nausea, vomitting, or diarrhoea. No bright red  blood per rectum, no abdominal pain, change in bowel movement, or constipation.  Genito Urinary  No frequency, urgency, dysuria, no bleeding Musculo Skeletal  No myalgia, arthralgia, joint swelling or pain  Neurologic  No weakness, numbness, change in gait,  Psychology  No depression, anxiety, insomnia.   Vitals:  Blood pressure 120/68, pulse 66, temperature 98.4 F (36.9 C), temperature source Oral, resp. rate 18, height 5' 4"  (1.626 m), weight 124 lb 4 oz (56.4 kg), SpO2 99 %.  Physical Exam: WD in NAD Neck  Supple NROM, without any enlargements.  Lymph Node Survey No cervical supraclavicular or inguinal adenopathy Cardiovascular  Pulse normal rate, regularity and rhythm. S1 and S2 normal.  Lungs  Clear to auscultation bilateraly, without wheezes/crackles/rhonchi. Good air movement.  Skin  No rash/lesions/breakdown  Psychiatry  Alert and oriented to person, place, and time  Abdomen  Slightly distended, slightly tender, dull to percuss, no palpable masses.  Back No CVA tenderness Genito Urinary  Vulva/vagina: Normal external female genitalia.  No lesions. No discharge or bleeding.  Bladder/urethra:  No lesions or masses, well supported bladder  Vagina: see below  Cervix: The cervix is rigid and hard (mucosal surface hyperemic but no mucosal cancer). There is apparent submucal infiltration of either fibrosis or tumor. THere is extension along the anterior vagina of this fibrosis. The fibrotic change is less than what was appreciated in March, 2019 but not completely rormal.    Uterus:  Enlarged, somewhat mobile, contiguous with cervical mass  Adnexa: There is fullness in the left adnexa consistent with the known mass it is difficulty to characterize completely on exam.  Rectal  Good tone, no masses no cul de sac nodularity. No parametrial disease. No nodularity in the cul de sac or apparent rectal wall involvement.  Extremities  No bilateral cyanosis, clubbing or  edema.    Thereasa Solo, MD  01/06/2018, 5:43 PM

## 2018-01-08 DIAGNOSIS — K56699 Other intestinal obstruction unspecified as to partial versus complete obstruction: Secondary | ICD-10-CM | POA: Diagnosis not present

## 2018-01-08 DIAGNOSIS — C786 Secondary malignant neoplasm of retroperitoneum and peritoneum: Secondary | ICD-10-CM | POA: Diagnosis not present

## 2018-01-08 DIAGNOSIS — I2510 Atherosclerotic heart disease of native coronary artery without angina pectoris: Secondary | ICD-10-CM | POA: Diagnosis not present

## 2018-01-08 DIAGNOSIS — I429 Cardiomyopathy, unspecified: Secondary | ICD-10-CM | POA: Diagnosis not present

## 2018-01-08 DIAGNOSIS — C189 Malignant neoplasm of colon, unspecified: Secondary | ICD-10-CM | POA: Diagnosis not present

## 2018-01-08 DIAGNOSIS — C562 Malignant neoplasm of left ovary: Secondary | ICD-10-CM | POA: Diagnosis not present

## 2018-01-09 ENCOUNTER — Encounter (HOSPITAL_COMMUNITY): Payer: Self-pay

## 2018-01-09 ENCOUNTER — Ambulatory Visit (HOSPITAL_COMMUNITY)
Admission: RE | Admit: 2018-01-09 | Discharge: 2018-01-09 | Disposition: A | Discharge status: Home / Self Care | Payer: Medicare Other | Source: Ambulatory Visit | Attending: Hematology and Oncology | Admitting: Hematology and Oncology

## 2018-01-09 DIAGNOSIS — C786 Secondary malignant neoplasm of retroperitoneum and peritoneum: Secondary | ICD-10-CM | POA: Insufficient documentation

## 2018-01-09 DIAGNOSIS — C562 Malignant neoplasm of left ovary: Secondary | ICD-10-CM | POA: Diagnosis not present

## 2018-01-09 DIAGNOSIS — C569 Malignant neoplasm of unspecified ovary: Secondary | ICD-10-CM | POA: Diagnosis not present

## 2018-01-09 DIAGNOSIS — N838 Other noninflammatory disorders of ovary, fallopian tube and broad ligament: Secondary | ICD-10-CM | POA: Diagnosis not present

## 2018-01-09 MED ORDER — IOHEXOL 300 MG/ML  SOLN
100.0000 mL | Freq: Once | INTRAMUSCULAR | Status: AC | PRN
  Administered 2018-01-09: 100 mL via INTRAVENOUS

## 2018-01-12 ENCOUNTER — Inpatient Hospital Stay (HOSPITAL_BASED_OUTPATIENT_CLINIC_OR_DEPARTMENT_OTHER): Payer: Medicare Other | Admitting: Licensed Clinical Social Worker

## 2018-01-12 ENCOUNTER — Encounter: Payer: Self-pay | Admitting: Licensed Clinical Social Worker

## 2018-01-12 ENCOUNTER — Inpatient Hospital Stay (HOSPITAL_BASED_OUTPATIENT_CLINIC_OR_DEPARTMENT_OTHER): Payer: Medicare Other | Admitting: Hematology and Oncology

## 2018-01-12 ENCOUNTER — Inpatient Hospital Stay: Payer: Medicare Other

## 2018-01-12 VITALS — BP 98/62 | HR 91 | Temp 97.4°F | Resp 18 | Ht 64.0 in | Wt 117.2 lb

## 2018-01-12 DIAGNOSIS — C7982 Secondary malignant neoplasm of genital organs: Secondary | ICD-10-CM | POA: Diagnosis not present

## 2018-01-12 DIAGNOSIS — Z800 Family history of malignant neoplasm of digestive organs: Secondary | ICD-10-CM | POA: Insufficient documentation

## 2018-01-12 DIAGNOSIS — Z5111 Encounter for antineoplastic chemotherapy: Secondary | ICD-10-CM

## 2018-01-12 DIAGNOSIS — Z87891 Personal history of nicotine dependence: Secondary | ICD-10-CM

## 2018-01-12 DIAGNOSIS — D61818 Other pancytopenia: Secondary | ICD-10-CM | POA: Diagnosis not present

## 2018-01-12 DIAGNOSIS — Z7189 Other specified counseling: Secondary | ICD-10-CM

## 2018-01-12 DIAGNOSIS — R188 Other ascites: Secondary | ICD-10-CM | POA: Diagnosis not present

## 2018-01-12 DIAGNOSIS — C786 Secondary malignant neoplasm of retroperitoneum and peritoneum: Secondary | ICD-10-CM

## 2018-01-12 DIAGNOSIS — C562 Malignant neoplasm of left ovary: Secondary | ICD-10-CM

## 2018-01-12 DIAGNOSIS — I2510 Atherosclerotic heart disease of native coronary artery without angina pectoris: Secondary | ICD-10-CM

## 2018-01-12 DIAGNOSIS — Z808 Family history of malignant neoplasm of other organs or systems: Secondary | ICD-10-CM | POA: Insufficient documentation

## 2018-01-12 DIAGNOSIS — Z801 Family history of malignant neoplasm of trachea, bronchus and lung: Secondary | ICD-10-CM | POA: Diagnosis not present

## 2018-01-12 DIAGNOSIS — C801 Malignant (primary) neoplasm, unspecified: Secondary | ICD-10-CM

## 2018-01-12 DIAGNOSIS — I25118 Atherosclerotic heart disease of native coronary artery with other forms of angina pectoris: Secondary | ICD-10-CM

## 2018-01-12 DIAGNOSIS — C569 Malignant neoplasm of unspecified ovary: Secondary | ICD-10-CM | POA: Diagnosis not present

## 2018-01-12 LAB — CBC WITH DIFFERENTIAL (CANCER CENTER ONLY)
BASOS PCT: 0 %
Basophils Absolute: 0 10*3/uL (ref 0.0–0.1)
EOS ABS: 0 10*3/uL (ref 0.0–0.5)
Eosinophils Relative: 1 %
HCT: 33.7 % — ABNORMAL LOW (ref 34.8–46.6)
HEMOGLOBIN: 11.5 g/dL — AB (ref 11.6–15.9)
LYMPHS ABS: 1.3 10*3/uL (ref 0.9–3.3)
Lymphocytes Relative: 44 %
MCH: 34.6 pg — ABNORMAL HIGH (ref 25.1–34.0)
MCHC: 34.1 g/dL (ref 31.5–36.0)
MCV: 101.5 fL — ABNORMAL HIGH (ref 79.5–101.0)
MONO ABS: 0.1 10*3/uL (ref 0.1–0.9)
MONOS PCT: 4 %
NEUTROS PCT: 51 %
Neutro Abs: 1.5 10*3/uL (ref 1.5–6.5)
PLATELETS: 76 10*3/uL — AB (ref 145–400)
RBC: 3.32 MIL/uL — ABNORMAL LOW (ref 3.70–5.45)
RDW: 13.4 % (ref 11.2–14.5)
WBC Count: 3 10*3/uL — ABNORMAL LOW (ref 3.9–10.3)

## 2018-01-12 LAB — CMP (CANCER CENTER ONLY)
ALBUMIN: 3.5 g/dL (ref 3.5–5.0)
ALK PHOS: 87 U/L (ref 38–126)
ALT: 15 U/L (ref 0–44)
AST: 20 U/L (ref 15–41)
Anion gap: 5 (ref 5–15)
BUN: 17 mg/dL (ref 8–23)
CALCIUM: 9 mg/dL (ref 8.9–10.3)
CHLORIDE: 103 mmol/L (ref 98–111)
CO2: 31 mmol/L (ref 22–32)
CREATININE: 0.75 mg/dL (ref 0.44–1.00)
GFR, Estimated: >60 mL/min (ref >60)
GLUCOSE: 89 mg/dL (ref 70–99)
Potassium: 4.1 mmol/L (ref 3.5–5.1)
SODIUM: 139 mmol/L (ref 135–145)
Total Bilirubin: 0.4 mg/dL (ref 0.3–1.2)
Total Protein: 6.8 g/dL (ref 6.5–8.1)

## 2018-01-12 NOTE — Progress Notes (Addendum)
REFERRING PROVIDER: Heath Lark, MD [place], [place] [id number]  PRIMARY PROVIDER:  [name], PA-C  PRIMARY REASON FOR VISIT: 1. Disseminated ovarian cancer, left ([place])   2. Family history of melanoma   3. Family history of throat cancer       HISTORY OF PRESENT ILLNESS:   Carol Ferguson, a 67 y.o. female, was seen for a [place] cancer genetics consultation at the request of [name] due to a personal history of ovarian cancer.  Carol Ferguson presents to clinic today to discuss the possibility of a hereditary predisposition to cancer, genetic testing, and to further clarify her future cancer risks, as well as potential cancer risks for family members.   In 2019 at the age of 80, Carol Ferguson was diagnosed with ovarian cancer, initially thought to be colon cancer. This has been treated thus far with chemotherapy.   CANCER HISTORY:  Oncology History   MSI stable     Disseminated ovarian cancer, left ([place])   12/17/2016 Imaging    US pelvis 1. There appears to be a 2.2 x 1.9 x 2.0 cm mass centered in the cervix, likely extending into the lower uterus. Recommend correlation with physical exam. An MRI could better evaluate. 2. Thickened endometrium measuring 12 mm. Recommend gynecologic consultation and endometrial sampling given patient age. 3. Probable fibroid in the uterus.    12/27/2016 Pathology Results    Endocervix, curettage - BENIGN ENDOCERVICAL MUCOSA AND BENIGN SQUAMOUS MUCOSA    01/01/2017 Imaging    MRI pelvis 1. No classic intermediate to high signal intensity tissue disrupting the hypointense fibrous cervical stroma as typically seen in the setting of cervical carcinoma. However, there is asymmetric thickening of the posterior cervix with anterior displacement of the cervical canal and associated widening and heterogeneity of the endometrial cavity. As such, imaging features are indeterminate by MRI and close follow-up or tissue sampling is  recommended. 2. Apparent areas of tethering/scarring between the sigmoid colon posterior uterus/cervix. Diverticuli are noted in the sigmoid colon and this may be related to scarring from prior bouts is diverticulitis. 3. Small volume intraperitoneal free fluid.    01/28/2017 Pathology Results    1. Cervix, biopsy, posterior - SPINDLE CELL LESION CONSISTENT WITH SMOOTH MUSCLE NEOPLASM, SEE COMMENT. 2. Endometrium, curettage - BENIGN SQUAMOUS MUCOSA. - BENIGN SMOOTH MUSCLE. - NO ENDOMETRIUM PRESENT. Microscopic Comment 1. There is a spindle cell lesion with focal atypia. There is no necrosis or increase in mitotic figures. The sample is somewhat limited, but the differential includes a cellular leiomyoma.    01/28/2017 Surgery    Surgery: ultrasound guided dilation of cervix with D&C and cervical biopsy  Surgeons:  Donaciano Eva, MD  Operative findings: 6cm uterus, dilated endometrial cavity, very stenotic cervical os. Thickened, fibrotic cervix without discrete mass, grossly normal cervical mucosa.       03/22/2017 Imaging    CT abdomen and pelvis 1. Diffuse prominent fluid-filled small bowel with mesenteric edema, enteritis pattern. This may be infectious or inflammatory. There is no evidence of obstruction. 2. Mild omental stranding in the left abdomen likely reactive secondary to primary bowel process, omental caking is not entirely excluded. 3. Sigmoid colonic diverticulosis without diverticulitis. 4. Prominent heterogeneous endometrium, recommend correlation with recent D and C. 5.  Aortic Atherosclerosis (ICD10-I70.0).    06/26/2017 Imaging    US pelvis T/V images. Anteverted and heterogenous uterus measuring 5.85 x 3.87 x 3.36 cm. Right subserous fibroid with calcifications measuring 2.7 x 2.6  cm. Endometrial line appears thickened at 8.1 mm. Cervix appears normal, no mass seen. Right ovary not seen. Left adnexal mass cystic/solid measuring 7.6 x 6.0 x 7.1 cm.  Positive color flow Doppler.No free fluid in the posterior cul-de-sac.    06/26/2017 Tumor Marker    Patient's tumor was tested for the following markers: CA-125 Results of the tumor marker test revealed 90    06/26/2017 Tumor Marker    Patient's tumor was tested for the following markers: CEA Results of the tumor marker test revealed 4.5    06/27/2017 Imaging    Ct abdomen and pelvis New 7.4 cm left adnexal mass with worsening abdominal ascites and omental nodularity, suggesting metastatic ovarian carcinoma.    07/02/2017 Pathology Results    Cervix, biopsy, ectocervix - SQUAMOUS MUCOSA WITH ATYPICAL INFILTRATE WITHIN FIBROMUSCULAR STROMA. - SEE MICROSCOPIC DESCRIPTION. Microscopic Comment There is a small fragment of squamous mucosa with a small portion of benign squamous epithelium. Within the fibromuscular stroma there are strands and nests of epithelioid cells, which have an infiltrating pattern and there are a few with cytoplasmic vacuoles. The findings are worrisome for an infiltrating neoplastic process and additional tissue may be indicated. There is insufficient tissue remaining in the current biopsy for additional studies.    07/11/2017 Procedure    CT-guided biopsy of the peritoneal thickening in the left lateral abdomen. Procedure was technically challenging due to the small amount of peritoneal thickening and close proximity of multiple bowel loops. False negative biopsy is possible.    07/11/2017 Pathology Results    Soft Tissue Needle Core Biopsy, omentum - ADENOCARCINOMA. - SEE COMMENT. Microscopic Comment The malignant cells are positive for CDX-2, cytokeratin 20, and p53. They are negative for cytokeratin 7, estrogen receptor, and PAX-8. This immunohistochemical profile argues against a gynecologic primary, and the differential diagnosis includes gastrointestinal primary.    07/12/2017 Imaging    Ct abdomen and pelvis 1. New small bowel obstruction with transition point  seen in the right lower quadrant, possibly secondary to adhesions related to peritoneal carcinomatosis. No pneumatosis. 2. Findings suggestive of metastatic ovarian cancer. Slightly worsened abdominal ascites may be related to metastatic disease or reactive to the small-bowel obstruction. 3. Expected post biopsy changes in the left lower quadrant adjacent to peritoneal nodularity. No intra-abdominal hemorrhage.    07/12/2017 - 07/14/2017 Hospital Admission    She was admitted for management of subacute bowel obstruction    07/15/2017 Cancer Staging    Staging form: Ovary, Fallopian Tube, and Primary Peritoneal Carcinoma, AJCC 8th Edition - Clinical: cT3, cN0, cM0 - Signed by Heath Lark, MD on 10/09/2017    07/18/2017 Procedure    Successful placement of a right arm PICC with sonographic and fluoroscopic guidance. The catheter is ready for use.    08/04/2017 Imaging    The previously noted ascites in the abdomen and pelvis has resolved. 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  No small bowel or colonic obstruction is identified.  Generalized thickened bowel wall of the sigmoid colon.  Mixed cystic and solid mass of the left ovary is unchanged.  Status post prior cholecystectomy with mild postsurgical intrahepatic biliary ductal dilatation.     09/29/2017 Imaging    1. Interval resolution of small bowel obstruction. 2. Persistent findings of peritoneal carcinomatosis with improvement in ascites and probable improvement in the previously demonstrated ill-defined right lower quadrant mass. 3. Grossly stable complex left adnexal mass from recent prior studies. This was first demonstrated on the exam  done 06/27/2017 and has enlarged since then, consistent with an ovarian metastasis. This abuts the sigmoid colon and may invade it. 4. Bladder wall thickening and surrounding soft tissue stranding consistent with cystitis, possibly treatment related.      10/06/2017 Imaging    Incomplete barium enema due to severe, near complete obstruction of an irregular process in the sigmoid colon. The irregularity of this lesion is most concerning for colon cancer.    10/09/2017 Tumor Marker    Patient's tumor was tested for the following markers: CA-125 Results of the tumor marker test revealed 29.4    10/10/2017 -  Chemotherapy    The patient had carboplatin and Taxol    10/31/2017 Tumor Marker    Patient's tumor was tested for the following markers: CA-125 Results of the tumor marker test revealed 27.2    11/21/2017 Tumor Marker    Patient's tumor was tested for the following markers: CA-125 Results of the tumor marker test revealed 25.6    12/12/2017 Imaging    1. Interval increase in size of complex left adnexal mass. 2. Similar-appearing peritoneal carcinomatosis and small volume ascites.    01/05/2018 Tumor Marker    Patient's tumor was tested for the following markers: CA-125 Results of the tumor marker test revealed 44.1    01/09/2018 Imaging    1. Stable cystic and solid enhancing mass predominantly in the LEFT adnexal region but also extends the RIGHT adnexal region. 2. Stable peritoneal carcinomatosis along the ventral and deep pelvic peritoneal surfaces as well as the greater omentum. 3. Small volume free fluid along the RIGHT hepatic margin not changed. 4. No bowel obstruction.      HORMONAL RISK FACTORS:   OCP use for approximately 1.5 years.  Ovaries intact: yes.  Hysterectomy: no.  Menopausal status: postmenopausal.  Colonoscopy: yes; normal. Mammogram within the last year: no, last one was in 2012 and it was normal.    Past Medical History:  Diagnosis Date  . Anemia   . Anxiety   . 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 CAD (coronary artery disease)    a. Inf STEMI/LHC 09/2016 which showed occluded mid LCx treated with PCI, DES; remaining cath details included a proximal to mid LAD lesion of 20% stenosis, and  proximal to mid RCA 25% stenosis. EF 55-60%.  . Cancer ([place])   . Clotting disorder ([place])   . Depression   . Diverticulosis   . Family history of melanoma   . Family history of throat cancer   . Gallstones   . Ischemic cardiomyopathy    a. 2D Echo 09/28/16 showed EF 55-60%, probable severe hypokinesis of the entire inferiormyocardium, normal diastolic parameters.  . Myocardial infarction ([place]) 09/26/2016  . Pneumonia   . Presence of tooth-root and mandibular implants   . Tobacco abuse   . Wears glasses     Past Surgical History:  Procedure Laterality Date  . CERVICAL CONIZATION W/BX N/A 01/28/2017   Procedure: CONIZATION CERVIX WITH BIOPSY;  Surgeon: [name], MD;  [facility];  Service: Gynecology;  Laterality: N/A;  . CHOLECYSTECTOMY    . CORONARY STENT INTERVENTION N/A 09/26/2016   Procedure: Coronary Stent Intervention;  Surgeon: Belva Crome, MD;  Location: [facility];  Service: Cardiovascular;  Laterality: N/A;  . DILATION AND CURETTAGE OF UTERUS N/A 01/28/2017   Procedure: DILATATION AND CURETTAGE WITH ULTRASOUND GUIDENCE;  Surgeon: [name], MD;  Location[facility];  Service: Gynecology;  Laterality: N/A;  . LEFT HEART CATH AND CORONARY ANGIOGRAPHY N/A 09/26/2016   Procedure: Left Heart Cath and Coronary Angiography;  Surgeon: Belva Crome, MD;  Location: [facility];  Service: Cardiovascular;  Laterality: N/A;    Social History   Socioeconomic History  . Marital status: Divorced    Spouse name: Not on file  . Number of children: 0  . Years of education: Not on file  . Highest education level: Not on file  Occupational History  . Occupation: retired  Scientific laboratory technician  . Financial resource strain: Not on file  . Food insecurity:    Worry: Not on file    Inability: Not on file  . Transportation needs:    Medical: Not on file    Non-medical: Not on file  Tobacco Use  . Smoking status: Former Smoker     Packs/day: 1.00    Years: 35.00    Pack years: 35.00    Types: Cigarettes    Last attempt to quit: 09/26/2016    Years since quitting: 1.2  . Smokeless tobacco: Never Used  Substance and Sexual Activity  . Alcohol use: Not Currently  . Drug use: No  . Sexual activity: Never  Lifestyle  . Physical activity:    Days per week: Not on file    Minutes per session: Not on file  . Stress: Not on file  Relationships  . Social connections:    Talks on phone: Not on file    Gets together: Not on file    Attends religious service: Not on file    Active member of club or organization: Not on file    Attends meetings of clubs or organizations: Not on file    Relationship status: Not on file  Other Topics Concern  . Not on file  Social History Narrative  . Not on file     FAMILY HISTORY:  We obtained a detailed, 4-generation family history.  Significant diagnoses are listed below: Family History  Problem Relation Age of Onset  . COPD Mother        d. 68  . Melanoma Brother        dx 11, d 65, stage IV  . Heart disease Father        d. 36s  . Stroke Father   . Heart disease Brother        d. 52  . Diabetes Brother   . Heart attack Brother   . Heart failure Brother   . Throat cancer Maternal Aunt        d. 33  . Colon cancer Neg Hx   . Liver cancer Neg Hx   . Pancreatic cancer Neg Hx   . Rectal cancer Neg Hx   . Stomach cancer Neg Hx    Carol Ferguson does not have children. She had two brothers, one passed away at 19 from heart issues/diabetes complications, and the other was diagnosed with melanoma at 21 and died at 25. Both of her brothers had a son and a daughter each, no cancer history.   Carol Ferguson does not have much information about her father's side. He died at 64 from heart issues. She believes he had 3 brothers, no cancer that she knows of. She does not have any information about her paternal grandparents.   Carol Ferguson mother died at 78 from COPD. She had one sister  who died at 51 of throat cancer, she was a smoker, no  children. Carol Ferguson maternal grandfather suffered from heart issues and grand mal seizures and died at 75. Her maternal grandmother died at 59 of heart failure.    Carol Ferguson is unaware of previous family history of genetic testing for hereditary cancer risks. Patient's maternal ancestors are of unknown (caucasian) descent, and paternal ancestors are of unknown (caucasian) descent. There is no reported Ashkenazi Jewish ancestry. There is no known consanguinity.  GENETIC COUNSELING ASSESSMENT: Carol Ferguson is a 67 y.o. female with a personal history which is somewhat suggestive of a Hereditary Cancer Predisposition Syndrome. We, therefore, discussed and recommended the following at today's visit.   DISCUSSION: We discussed that about 5-10% of breast cancer cases are hereditary.  We reviewed the characteristics, features and inheritance patterns of hereditary cancer syndromes. We also discussed genetic testing, including the appropriate family members to test, the process of testing, insurance coverage and turn-around-time for results. We discussed the implications of a negative, positive and/or variant of uncertain significant result. We recommended (patient) pursue genetic testing for the [facility] Panel + my Choice HRD.   The [facility] gene panel offered by [employer] includes sequencing and deletion/duplication testing of the following 35 genes: APC, ATM, AXIN2, BARD1, BMPR1A, BRCA1, BRCA2, BRIP1, CHD1, CDK4, CDKN2A, CHEK2, EPCAM (large rearrangement only), HOXB13, GALNT12, MLH1, MSH2, MSH3, MSH6, MUTYH, NBN, NTHL1, PALB2, PMS2, PTEN, RAD51C, RAD51D, RNF43, RPS20, SMAD4, STK11, and TP53. Sequencing was performed for select regions of POLE and POLD1, and large rearrangement analysis was performed for select regions of GREM1.   We discussed that genetic testing through [facility] will test for hereditary mutations that could explain  her diagnosis of cancer.  However, homologous recombination testing (HRD) is genetic testing performed on her omentum biopsy that can determine genetic changes that could influence her management.  HRD testing is performed in tandem with genetic testing, and typically at no additional cost.    We discussed that if she is found to have a mutation in one of these genes, it may impact surgical decisions, and alter future medical management recommendations such as increased cancer screenings and consideration of risk reducing surgeries.  A positive result could also have implications for the patient's family members.  A Negative result would mean we were unable to identify a hereditary component to her cancer, but does not rule out the possibility of a hereditary basis for her cancer.  There could be mutations that are undetectable by current technology, or in genes not yet tested or identified to increase cancer risk.    We discussed the potential to find a Variant of Uncertain Significance or VUS.  These are variants that have not yet been identified as pathogenic or benign, and it is unknown if this variant is associated with increased cancer risk or if this is a normal finding.  Most VUS's are reclassified to benign or likely benign.   It should not be used to make medical management decisions. With time, we suspect the lab will determine the significance of any VUS's identified if any.   Based on Carol Ferguson's personal history of ovarian cancer, she meets medical criteria for genetic testing. Despite that she meets criteria, she may still have an out of pocket cost.   PLAN: After considering the risks, benefits, and limitations, Carol Ferguson  provided informed consent to pursue genetic testing and the blood sample was sent to [facility] for analysis of the [facility] panel. Results should be available within approximately 2-3 weeks' time, at which  point they will be disclosed by telephone to Carol Ferguson,  as will any additional recommendations warranted by these results. Carol Ferguson will receive a summary of her genetic counseling visit and a copy of her results once available. This information will also be available in Epic.   Lastly, we encouraged Carol Ferguson to remain in contact with cancer genetics annually so that we can continuously update the family history and inform her of any changes in cancer genetics and testing that may be of benefit for this family.   Ms.  Ferguson questions were answered to her satisfaction today. Our contact information was provided should additional questions or concerns arise. Thank you for the referral and allowing [place] to share in the care of your patient.   Epimenio Foot, MS Genetic Counselor [email or web address] Phone: [phone]  The patient was seen for a total of 40  minutes in face-to-face genetic counseling.  The patient was accompanied today by her friend, Zigmund Daniel. Genetic counseling intern Melvenia Beam was present during the session and assisted with taking the family history.

## 2018-01-13 ENCOUNTER — Telehealth: Payer: Self-pay

## 2018-01-13 ENCOUNTER — Encounter: Payer: Self-pay | Admitting: Hematology and Oncology

## 2018-01-13 DIAGNOSIS — Z5111 Encounter for antineoplastic chemotherapy: Secondary | ICD-10-CM | POA: Insufficient documentation

## 2018-01-13 LAB — CA 125: CANCER ANTIGEN (CA) 125: 45.3 U/mL — AB (ref 0.0–38.1)

## 2018-01-13 NOTE — Progress Notes (Signed)
[place] OFFICE PROGRESS NOTE  Patient Care Team: Donzetta Kohut as PCP - General (Physician Assistant) [name], MD as Consulting Physician (Obstetrics and Gynecology) Mauri Pole, MD as Consulting Physician (Gastroenterology) Belva Crome, MD as Consulting Physician (Cardiology)  ASSESSMENT & PLAN:  Disseminated ovarian cancer, left [facility]) I have reviewed multiple imaging studies with the patient and family Overall, she has mild disease progression on imaging study in tumor marker monitoring The patient was also symptomatic recently I will discontinue her current chemotherapy regimen I reviewed with her the current guidelines and discussed multiple different treatment options I recommend consideration for liposomal doxorubicin along with bevacizumab I will get pretreatment echocardiogram I will skip bevacizumab on day 1 and reassess If she has no further GI symptoms, we will proceed with bevacizumab on day 15 I will see her back in 2 weeks prior to the start date of treatment for further discussion about plan of care.  CAD (coronary artery disease), native coronary artery She has history of coronary artery disease status post stents placement Her last echocardiogram a year ago showed normal ejection fraction We discussed the risk of cardiomyopathy in association with doxorubicin I will get repeat echocardiogram done before we proceed with treatment  Peritoneal carcinomatosis [facility]) She has significant signs of peritoneal carcinomatosis and was recently symptomatic We discussed the importance of regular laxative therapy I would not proceed with bevacizumab on day 1 because of this reason  Goals of care, counseling/discussion We had extensive goals of care discussions again We discussed the rationale of not pursuing surgical debulking surgery due to extensive disease   Orders Placed This Encounter  Procedures  . ECHOCARDIOGRAM COMPLETE     Standing Status:   Future    Standing Expiration Date:   04/15/2019    Scheduling Instructions:     Cardiologist Dr. Daneen Schick    Order Specific Question:   Where should this test be performed    Answer:   [facility]    Order Specific Question:   Complete or Limited study?    Answer:   Complete    Order Specific Question:   With Image Enhancing Agent or without Image Enhancing Agent?    Answer:   With Image Enhancing Agent    Order Specific Question:   Reason for exam-Echo    Answer:   Chemotherapy evaluation  v87.41 / v58.11    INTERVAL HISTORY: Please see below for problem oriented charting. She returns with family member to review test results and treatment recommendation Since the last time I saw her, she had improvement of her bowel habits and is no longer constipated She denies nausea No significant peripheral neuropathy from recent treatment  SUMMARY OF ONCOLOGIC HISTORY: Oncology History   MSI stable     Disseminated ovarian cancer, left ([place])   12/17/2016 Imaging    US pelvis 1. There appears to be a 2.2 x 1.9 x 2.0 cm mass centered in the cervix, likely extending into the lower uterus. Recommend correlation with physical exam. An MRI could better evaluate. 2. Thickened endometrium measuring 12 mm. Recommend gynecologic consultation and endometrial sampling given patient age. 3. Probable fibroid in the uterus.    12/27/2016 Pathology Results    Endocervix, curettage - BENIGN ENDOCERVICAL MUCOSA AND BENIGN SQUAMOUS MUCOSA    01/01/2017 Imaging    MRI pelvis 1. No classic intermediate to high signal intensity tissue disrupting the hypointense fibrous cervical stroma as typically seen in the setting of  cervical carcinoma. However, there is asymmetric thickening of the posterior cervix with anterior displacement of the cervical canal and associated widening and heterogeneity of the endometrial cavity. As such, imaging features are indeterminate by MRI and close follow-up  or tissue sampling is recommended. 2. Apparent areas of tethering/scarring between the sigmoid colon posterior uterus/cervix. Diverticuli are noted in the sigmoid colon and this may be related to scarring from prior bouts is diverticulitis. 3. Small volume intraperitoneal free fluid.    01/28/2017 Pathology Results    1. Cervix, biopsy, posterior - SPINDLE CELL LESION CONSISTENT WITH SMOOTH MUSCLE NEOPLASM, SEE COMMENT. 2. Endometrium, curettage - BENIGN SQUAMOUS MUCOSA. - BENIGN SMOOTH MUSCLE. - NO ENDOMETRIUM PRESENT. Microscopic Comment 1. There is a spindle cell lesion with focal atypia. There is no necrosis or increase in mitotic figures. The sample is somewhat limited, but the differential includes a cellular leiomyoma.    01/28/2017 Surgery    Surgery: ultrasound guided dilation of cervix with D&C and cervical biopsy  Surgeons:  Donaciano Eva, MD  Operative findings: 6cm uterus, dilated endometrial cavity, very stenotic cervical os. Thickened, fibrotic cervix without discrete mass, grossly normal cervical mucosa.       03/22/2017 Imaging    CT abdomen and pelvis 1. Diffuse prominent fluid-filled small bowel with mesenteric edema, enteritis pattern. This may be infectious or inflammatory. There is no evidence of obstruction. 2. Mild omental stranding in the left abdomen likely reactive secondary to primary bowel process, omental caking is not entirely excluded. 3. Sigmoid colonic diverticulosis without diverticulitis. 4. Prominent heterogeneous endometrium, recommend correlation with recent D and C. 5.  Aortic Atherosclerosis (ICD10-I70.0).    06/26/2017 Imaging    US pelvis T/V images. Anteverted and heterogenous uterus measuring 5.85 x 3.87 x 3.36 cm. Right subserous fibroid with calcifications measuring 2.7 x 2.6 cm. Endometrial line appears thickened at 8.1 mm. Cervix appears normal, no mass seen. Right ovary not seen. Left adnexal mass cystic/solid measuring  7.6 x 6.0 x 7.1 cm. Positive color flow Doppler.No free fluid in the posterior cul-de-sac.    06/26/2017 Tumor Marker    Patient's tumor was tested for the following markers: CA-125 Results of the tumor marker test revealed 90    06/26/2017 Tumor Marker    Patient's tumor was tested for the following markers: CEA Results of the tumor marker test revealed 4.5    06/27/2017 Imaging    Ct abdomen and pelvis New 7.4 cm left adnexal mass with worsening abdominal ascites and omental nodularity, suggesting metastatic ovarian carcinoma.    07/02/2017 Pathology Results    Cervix, biopsy, ectocervix - SQUAMOUS MUCOSA WITH ATYPICAL INFILTRATE WITHIN FIBROMUSCULAR STROMA. - SEE MICROSCOPIC DESCRIPTION. Microscopic Comment There is a small fragment of squamous mucosa with a small portion of benign squamous epithelium. Within the fibromuscular stroma there are strands and nests of epithelioid cells, which have an infiltrating pattern and there are a few with cytoplasmic vacuoles. The findings are worrisome for an infiltrating neoplastic process and additional tissue may be indicated. There is insufficient tissue remaining in the current biopsy for additional studies.    07/11/2017 Procedure    CT-guided biopsy of the peritoneal thickening in the left lateral abdomen. Procedure was technically challenging due to the small amount of peritoneal thickening and close proximity of multiple bowel loops. False negative biopsy is possible.    07/11/2017 Pathology Results    Soft Tissue Needle Core Biopsy, omentum - ADENOCARCINOMA. - SEE COMMENT. Microscopic Comment The malignant cells are positive for  CDX-2, cytokeratin 20, and p53. They are negative for cytokeratin 7, estrogen receptor, and PAX-8. This immunohistochemical profile argues against a gynecologic primary, and the differential diagnosis includes gastrointestinal primary.    07/12/2017 Imaging    Ct abdomen and pelvis 1. New small bowel obstruction  with transition point seen in the right lower quadrant, possibly secondary to adhesions related to peritoneal carcinomatosis. No pneumatosis. 2. Findings suggestive of metastatic ovarian cancer. Slightly worsened abdominal ascites may be related to metastatic disease or reactive to the small-bowel obstruction. 3. Expected post biopsy changes in the left lower quadrant adjacent to peritoneal nodularity. No intra-abdominal hemorrhage.    07/12/2017 - 07/14/2017 Hospital Admission    She was admitted for management of subacute bowel obstruction    07/15/2017 Cancer Staging    Staging form: Ovary, Fallopian Tube, and Primary Peritoneal Carcinoma, AJCC 8th Edition - Clinical: cT3, cN0, cM0 - Signed by Heath Lark, MD on 10/09/2017    07/18/2017 Procedure    Successful placement of a right arm PICC with sonographic and fluoroscopic guidance. The catheter is ready for use.    08/04/2017 Imaging    The previously noted ascites in the abdomen and pelvis has resolved. 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  No small bowel or colonic obstruction is identified.  Generalized thickened bowel wall of the sigmoid colon.  Mixed cystic and solid mass of the left ovary is unchanged.  Status post prior cholecystectomy with mild postsurgical intrahepatic biliary ductal dilatation.     09/29/2017 Imaging    1. Interval resolution of small bowel obstruction. 2. Persistent findings of peritoneal carcinomatosis with improvement in ascites and probable improvement in the previously demonstrated ill-defined right lower quadrant mass. 3. Grossly stable complex left adnexal mass from recent prior studies. This was first demonstrated on the exam done 06/27/2017 and has enlarged since then, consistent with an ovarian metastasis. This abuts the sigmoid colon and may invade it. 4. Bladder wall thickening and surrounding soft tissue stranding consistent with cystitis, possibly treatment  related.     10/06/2017 Imaging    Incomplete barium enema due to severe, near complete obstruction of an irregular process in the sigmoid colon. The irregularity of this lesion is most concerning for colon cancer.    10/09/2017 Tumor Marker    Patient's tumor was tested for the following markers: CA-125 Results of the tumor marker test revealed 29.4    10/10/2017 -  Chemotherapy    The patient had carboplatin and Taxol    10/31/2017 Tumor Marker    Patient's tumor was tested for the following markers: CA-125 Results of the tumor marker test revealed 27.2    11/21/2017 Tumor Marker    Patient's tumor was tested for the following markers: CA-125 Results of the tumor marker test revealed 25.6    12/12/2017 Imaging    1. Interval increase in size of complex left adnexal mass. 2. Similar-appearing peritoneal carcinomatosis and small volume ascites.    01/05/2018 Tumor Marker    Patient's tumor was tested for the following markers: CA-125 Results of the tumor marker test revealed 44.1    01/09/2018 Imaging    1. Stable cystic and solid enhancing mass predominantly in the LEFT adnexal region but also extends the RIGHT adnexal region. 2. Stable peritoneal carcinomatosis along the ventral and deep pelvic peritoneal surfaces as well as the greater omentum. 3. Small volume free fluid along the RIGHT hepatic margin not changed. 4. No bowel obstruction.    01/22/2018 -  Chemotherapy    The patient had bevacizumab (AVASTIN) 525 mg in sodium chloride 0.9 % 100 mL chemo infusion, 10 mg/kg = 525 mg, Intravenous,  Once, 0 of 6 cycles DOXOrubicin HCL LIPOSOMAL (DOXIL) 62 mg in dextrose 5 % 250 mL chemo infusion, 40 mg/m2 = 62 mg, Intravenous,  Once, 0 of 6 cycles  for chemotherapy treatment.      REVIEW OF SYSTEMS:   Constitutional: Denies fevers, chills or abnormal weight loss Eyes: Denies blurriness of vision Ears, nose, mouth, throat, and face: Denies mucositis or sore throat Respiratory:  Denies cough, dyspnea or wheezes Cardiovascular: Denies palpitation, chest discomfort or lower extremity swelling Skin: Denies abnormal skin rashes Lymphatics: Denies new lymphadenopathy or easy bruising Neurological:Denies numbness, tingling or new weaknesses Behavioral/Psych: Mood is stable, no new changes  All other systems were reviewed with the patient and are negative.  I have reviewed the past medical history, past surgical history, social history and family history with the patient and they are unchanged from previous note.  ALLERGIES:  is allergic to flagyl [metronidazole].  MEDICATIONS:  Current Outpatient Medications  Medication Sig Dispense Refill  . aspirin EC 81 MG tablet Take 81 mg by mouth daily.    Marland Kitchen dexamethasone (DECADRON) 4 MG tablet Take 5 tabs at the night before and 5 tab the morning of chemotherapy, every 3 weeks, by mouth 60 tablet 0  . FLUoxetine (PROZAC) 20 MG capsule Take 1 capsule (20 mg total) by mouth daily. 90 capsule 1  . LORazepam (ATIVAN) 1 MG tablet Take 1 tablet (1 mg total) by mouth every 8 (eight) hours as needed for anxiety or sleep. 90 tablet 1  . mirtazapine (REMERON) 15 MG tablet Take 1 tablet (15 mg total) by mouth at bedtime. 30 tablet 1  . morphine (MSIR) 15 MG tablet Take 1 tablet (15 mg total) by mouth every 4 (four) hours as needed for severe pain. 90 tablet 0  . ondansetron (ZOFRAN) 8 MG tablet Take 1 tablet (8 mg total) by mouth every 8 (eight) hours as needed for nausea. 30 tablet 3  . prochlorperazine (COMPAZINE) 10 MG tablet Take 1 tablet (10 mg total) by mouth every 6 (six) hours as needed for nausea or vomiting. 30 tablet 1  . sennosides-docusate sodium (SENOKOT-S) 8.6-50 MG tablet Take 2-4 tablets by mouth 2 (two) times daily.     Current Facility-Administered Medications  Medication Dose Route Frequency Provider Last Rate Last Dose  . 0.9 %  sodium chloride infusion  500 mL Intravenous Once Nandigam, Venia Minks, MD        PHYSICAL  EXAMINATION: ECOG PERFORMANCE STATUS: 1 - Symptomatic but completely ambulatory  Vitals:   01/12/18 1443  BP: 98/62  Pulse: 91  Resp: 18  Temp: (!) 97.4 F (36.3 C)  SpO2: 100%   Filed Weights   01/12/18 1443  Weight: 117 lb 3.2 oz (53.2 kg)    GENERAL:alert, no distress and comfortable SKIN: skin color, texture, turgor are normal, no rashes or significant lesions EYES: normal, Conjunctiva are pink and non-injected, sclera clear OROPHARYNX:no exudate, no erythema and lips, buccal mucosa, and tongue normal  NECK: supple, thyroid normal size, non-tender, without nodularity LYMPH:  no palpable lymphadenopathy in the cervical, axillary or inguinal LUNGS: clear to auscultation and percussion with normal breathing effort HEART: regular rate & rhythm and no murmurs and no lower extremity edema ABDOMEN:abdomen soft, mildly distended, no rebound or guarding or tenderness Musculoskeletal:no cyanosis of digits and no clubbing  NEURO: alert &  oriented x 3 with fluent speech, no focal motor/sensory deficits  LABORATORY DATA:  I have reviewed the data as listed    Component Value Date/Time   NA 139 01/12/2018 1340   NA 143 09/26/2017   K 4.1 01/12/2018 1340   CL 103 01/12/2018 1340   CO2 31 01/12/2018 1340   GLUCOSE 89 01/12/2018 1340   BUN 17 01/12/2018 1340   BUN 20 09/26/2017   CREATININE 0.75 01/12/2018 1340   CALCIUM 9.0 01/12/2018 1340   PROT 6.8 01/12/2018 1340   PROT 6.7 11/28/2016 0849   ALBUMIN 3.5 01/12/2018 1340   ALBUMIN 4.3 11/28/2016 0849   AST 20 01/12/2018 1340   ALT 15 01/12/2018 1340   ALKPHOS 87 01/12/2018 1340   BILITOT 0.4 01/12/2018 1340   GFRNONAA >60 01/12/2018 1340   GFRAA >60 01/12/2018 1340    No results found for: SPEP, UPEP  Lab Results  Component Value Date   WBC 3.0 (L) 01/12/2018   NEUTROABS 1.5 01/12/2018   HGB 11.5 (L) 01/12/2018   HCT 33.7 (L) 01/12/2018   MCV 101.5 (H) 01/12/2018   PLT 76 (L) 01/12/2018      Chemistry       Component Value Date/Time   NA 139 01/12/2018 1340   NA 143 09/26/2017   K 4.1 01/12/2018 1340   CL 103 01/12/2018 1340   CO2 31 01/12/2018 1340   BUN 17 01/12/2018 1340   BUN 20 09/26/2017   CREATININE 0.75 01/12/2018 1340   GLU 92 09/26/2017      Component Value Date/Time   CALCIUM 9.0 01/12/2018 1340   ALKPHOS 87 01/12/2018 1340   AST 20 01/12/2018 1340   ALT 15 01/12/2018 1340   BILITOT 0.4 01/12/2018 1340       RADIOGRAPHIC STUDIES: I have reviewed multiple imaging study with the patient I have personally reviewed the radiological images as listed and agreed with the findings in the report. Ct Abdomen Pelvis W Contrast  Result Date: 01/09/2018 CLINICAL DATA:  Ovarian carcinoma. Ongoing chemotherapy. Subsequent treatment evaluation. EXAM: CT ABDOMEN AND PELVIS WITH CONTRAST TECHNIQUE: Multidetector CT imaging of the abdomen and pelvis was performed using the standard protocol following bolus administration of intravenous contrast. CONTRAST:  160m OMNIPAQUE IOHEXOL 300 MG/ML  SOLN COMPARISON:  CT 12/12/2017 FINDINGS: Lower chest: Lung bases are clear. Hepatobiliary: Mild intrahepatic duct dilatation. No significant extrahepatic duct dilatation. Postcholecystectomy. Pancreas: Pancreas is normal. No ductal dilatation. No pancreatic inflammation. Spleen: Normal spleen Adrenals/urinary tract: Adrenal glands normal. Low-density lesion in the LEFT kidney likely represent benign cysts. Ureters and bladder normal. Stomach/Bowel: Stomach, small bowel, appendix, and cecum are normal. The colon and rectosigmoid colon are normal. Vascular/Lymphatic: Abdominal aorta is normal caliber. No periportal or retroperitoneal adenopathy. No pelvic adenopathy. Reproductive: Rounded peripheral enhancing complex mass in the deep LEFT pelvis measures 5.3 by 5.2 cm in axial dimension compared with 5.0 x 5.0 cm on prior at same level. No significant change. There is cystic component more superiorly adjacent to  the solid enhancing portion inferiorly which is also unchanged No new lesions identified. Nodularity extends towards the RIGHT adnexa. No change. Uterus normal. Other: peritoneal carcinomatosis described on comparison exam is overall similar. Thickened peritoneum in the lower abdomen on coronal image 28/5 measures 3 mm compared to 3-4 mm on prior. Ill-defined hazy tissue in the greater omentum of the LEFT upper quadrant (image 46/2) is also similar. Trace amount of fluid around the RIGHT hepatic lobe is also similar. Musculoskeletal: No aggressive  osseous lesion. IMPRESSION: 1. Stable cystic and solid enhancing mass predominantly in the LEFT adnexal region but also extends the RIGHT adnexal region. 2. Stable peritoneal carcinomatosis along the ventral and deep pelvic peritoneal surfaces as well as the greater omentum. 3. Small volume free fluid along the RIGHT hepatic margin not changed. 4. No bowel obstruction. Electronically Signed   By: Suzy Bouchard M.D.   On: 01/09/2018 16:38   Dg Abd 2 Views  Result Date: 01/05/2018 CLINICAL DATA:  Constipation, abdominal pain. Recent nausea with chemotherapy for ovarian malignancy. No current complaints. EXAM: ABDOMEN - 2 VIEW COMPARISON:  Abdominal and pelvic CT scan of December 12, 2017 FINDINGS: The colonic stool burden is increased diffusely. There is no small or large bowel obstructive pattern. The bony structures exhibit no acute abnormalities. There are no abnormal soft tissue calcifications. There are surgical clips in the gallbladder fossa. IMPRESSION: Increased colonic stool burden is consistent with constipation in the appropriate clinical setting. No evidence of ileus or obstruction. Electronically Signed   By: David  Martinique M.D.   On: 01/05/2018 15:07    All questions were answered. The patient knows to call the clinic with any problems, questions or concerns. No barriers to learning was detected.  I spent 40 minutes counseling the patient face to face.  The total time spent in the appointment was 55 minutes and more than 50% was on counseling and review of test results  Heath Lark, MD 01/13/2018 6:47 AM

## 2018-01-13 NOTE — Assessment & Plan Note (Signed)
She has history of coronary artery disease status post stents placement Her last echocardiogram a year ago showed normal ejection fraction We discussed the risk of cardiomyopathy in association with doxorubicin I will get repeat echocardiogram done before we proceed with treatment

## 2018-01-13 NOTE — Telephone Encounter (Signed)
-----   Message from Heath Lark, MD sent at 01/13/2018 12:06 PM EDT ----- Regarding: echo pls get echo done within a week It is ordered

## 2018-01-13 NOTE — Progress Notes (Signed)
DISCONTINUE ON PATHWAY REGIMEN - Ovarian     A cycle is every 21 days:     Paclitaxel      Carboplatin   **Always confirm dose/schedule in your pharmacy ordering system**  REASON: Disease Progression PRIOR TREATMENT: OVOS44: Carboplatin AUC=6 + Paclitaxel 175 mg/m2 q21 Days x 2-4 Cycles TREATMENT RESPONSE: Progressive Disease (PD)  START ON PATHWAY REGIMEN - Ovarian     A cycle is every 28 days:     Liposomal doxorubicin      Bevacizumab-xxxx   **Always confirm dose/schedule in your pharmacy ordering system**  Patient Characteristics: Recurrent or Progressive Disease, Third Line Therapy, Platinum Resistant or < 6 Months Since Last Platinum Therapy Therapeutic Status: Recurrent or Progressive Disease BRCA Mutation Status: Awaiting Test Results Line of Therapy: Third Line  Intent of Therapy: Non-Curative / Palliative Intent, Discussed with Patient

## 2018-01-13 NOTE — Assessment & Plan Note (Signed)
I have reviewed multiple imaging studies with the patient and family Overall, she has mild disease progression on imaging study in tumor marker monitoring The patient was also symptomatic recently I will discontinue her current chemotherapy regimen I reviewed with her the current guidelines and discussed multiple different treatment options I recommend consideration for liposomal doxorubicin along with bevacizumab I will get pretreatment echocardiogram I will skip bevacizumab on day 1 and reassess If she has no further GI symptoms, we will proceed with bevacizumab on day 15 I will see her back in 2 weeks prior to the start date of treatment for further discussion about plan of care.

## 2018-01-13 NOTE — Assessment & Plan Note (Signed)
She has significant signs of peritoneal carcinomatosis and was recently symptomatic We discussed the importance of regular laxative therapy I would not proceed with bevacizumab on day 1 because of this reason

## 2018-01-13 NOTE — Assessment & Plan Note (Signed)
We had extensive goals of care discussions again We discussed the rationale of not pursuing surgical debulking surgery due to extensive disease

## 2018-01-13 NOTE — Telephone Encounter (Signed)
Called and given appt for Echo for 9/26 at 10 am, to arrive at [place] [facility]. She verbalized understanding.

## 2018-01-15 ENCOUNTER — Ambulatory Visit (HOSPITAL_COMMUNITY)
Admission: RE | Admit: 2018-01-15 | Discharge: 2018-01-15 | Disposition: A | Discharge status: Home / Self Care | Payer: Medicare Other | Source: Ambulatory Visit | Attending: Hematology and Oncology | Admitting: Hematology and Oncology

## 2018-01-15 DIAGNOSIS — I509 Heart failure, unspecified: Secondary | ICD-10-CM | POA: Diagnosis not present

## 2018-01-15 DIAGNOSIS — Z5111 Encounter for antineoplastic chemotherapy: Secondary | ICD-10-CM | POA: Diagnosis not present

## 2018-01-15 DIAGNOSIS — C189 Malignant neoplasm of colon, unspecified: Secondary | ICD-10-CM | POA: Diagnosis not present

## 2018-01-15 DIAGNOSIS — I071 Rheumatic tricuspid insufficiency: Secondary | ICD-10-CM | POA: Diagnosis not present

## 2018-01-15 DIAGNOSIS — C562 Malignant neoplasm of left ovary: Secondary | ICD-10-CM | POA: Insufficient documentation

## 2018-01-15 DIAGNOSIS — I252 Old myocardial infarction: Secondary | ICD-10-CM | POA: Diagnosis not present

## 2018-01-15 DIAGNOSIS — E785 Hyperlipidemia, unspecified: Secondary | ICD-10-CM | POA: Insufficient documentation

## 2018-01-15 DIAGNOSIS — K56699 Other intestinal obstruction unspecified as to partial versus complete obstruction: Secondary | ICD-10-CM | POA: Diagnosis not present

## 2018-01-15 DIAGNOSIS — I25118 Atherosclerotic heart disease of native coronary artery with other forms of angina pectoris: Secondary | ICD-10-CM | POA: Diagnosis not present

## 2018-01-15 DIAGNOSIS — I2510 Atherosclerotic heart disease of native coronary artery without angina pectoris: Secondary | ICD-10-CM | POA: Diagnosis not present

## 2018-01-15 DIAGNOSIS — C786 Secondary malignant neoplasm of retroperitoneum and peritoneum: Secondary | ICD-10-CM | POA: Diagnosis not present

## 2018-01-15 DIAGNOSIS — I429 Cardiomyopathy, unspecified: Secondary | ICD-10-CM | POA: Diagnosis not present

## 2018-01-15 NOTE — Progress Notes (Signed)
  Echocardiogram 2D Echocardiogram has been performed.  Carol Ferguson L Androw 01/15/2018, 10:38 AM

## 2018-01-16 ENCOUNTER — Ambulatory Visit: Payer: Medicare Other | Admitting: Gynecologic Oncology

## 2018-01-16 ENCOUNTER — Telehealth: Payer: Self-pay | Admitting: *Deleted

## 2018-01-16 ENCOUNTER — Telehealth: Payer: Self-pay | Admitting: Interventional Cardiology

## 2018-01-16 ENCOUNTER — Other Ambulatory Visit: Payer: Self-pay | Admitting: Hematology and Oncology

## 2018-01-16 DIAGNOSIS — I5032 Chronic diastolic (congestive) heart failure: Secondary | ICD-10-CM

## 2018-01-16 DIAGNOSIS — I252 Old myocardial infarction: Secondary | ICD-10-CM

## 2018-01-16 NOTE — Telephone Encounter (Signed)
New Message:   Patient oncologist requested an ECHO yesterday and additional medication, patient is requesting a call back to discuss the concerns and questions

## 2018-01-16 NOTE — Telephone Encounter (Signed)
Medical Records faxed to [place] (New Pt Coordinator); release [phone]

## 2018-01-16 NOTE — Telephone Encounter (Signed)
Spoke with pt and she wanted to make sure Dr. Tamala Julian was aware she had an echo yesterday.  Advised pt that Dr. Tamala Julian was aware and had sent a message to me and [name] to let us know pumping function was low normal and that BNP should be obtained prior to starting chemo.  Also advised that [name] had written back to say BNP would be checked prior to each chemo and plan for echo every 2-3 months.  Pt verbalized understanding and was very appreciative for call.

## 2018-01-20 ENCOUNTER — Encounter: Payer: Self-pay | Admitting: Licensed Clinical Social Worker

## 2018-01-20 ENCOUNTER — Telehealth: Payer: Self-pay | Admitting: Licensed Clinical Social Worker

## 2018-01-20 ENCOUNTER — Other Ambulatory Visit: Payer: Self-pay | Admitting: Hematology and Oncology

## 2018-01-20 DIAGNOSIS — Z1379 Encounter for other screening for genetic and chromosomal anomalies: Secondary | ICD-10-CM | POA: Insufficient documentation

## 2018-01-20 NOTE — Telephone Encounter (Signed)
Revealed negative genetic testing on the [facility] panel.  This normal result indicates that it is unlikely Carol Ferguson's cancer is due to a hereditary cause.  It is unlikely that there is an increased risk of another cancer due to a mutation in one of these genes.  However, genetic testing is not perfect, and cannot definitively rule out a hereditary cause. Additionally, we discussed that we are still waiting on the results of the HRD testing and I will call her when those come back.

## 2018-01-22 DIAGNOSIS — K56699 Other intestinal obstruction unspecified as to partial versus complete obstruction: Secondary | ICD-10-CM | POA: Diagnosis not present

## 2018-01-22 DIAGNOSIS — C786 Secondary malignant neoplasm of retroperitoneum and peritoneum: Secondary | ICD-10-CM | POA: Diagnosis not present

## 2018-01-22 DIAGNOSIS — C801 Malignant (primary) neoplasm, unspecified: Secondary | ICD-10-CM | POA: Diagnosis not present

## 2018-01-22 DIAGNOSIS — C562 Malignant neoplasm of left ovary: Secondary | ICD-10-CM | POA: Diagnosis not present

## 2018-01-22 DIAGNOSIS — C763 Malignant neoplasm of pelvis: Secondary | ICD-10-CM | POA: Diagnosis not present

## 2018-01-22 DIAGNOSIS — I429 Cardiomyopathy, unspecified: Secondary | ICD-10-CM | POA: Diagnosis not present

## 2018-01-22 DIAGNOSIS — C189 Malignant neoplasm of colon, unspecified: Secondary | ICD-10-CM | POA: Diagnosis not present

## 2018-01-22 DIAGNOSIS — I2510 Atherosclerotic heart disease of native coronary artery without angina pectoris: Secondary | ICD-10-CM | POA: Diagnosis not present

## 2018-01-23 ENCOUNTER — Telehealth: Payer: Self-pay

## 2018-01-23 DIAGNOSIS — C801 Malignant (primary) neoplasm, unspecified: Secondary | ICD-10-CM | POA: Diagnosis not present

## 2018-01-23 DIAGNOSIS — N888 Other specified noninflammatory disorders of cervix uteri: Secondary | ICD-10-CM | POA: Diagnosis not present

## 2018-01-23 NOTE — Telephone Encounter (Signed)
She called and left a message to cancel 10/7 appts. She got a 2nd opinion at [facility]. She is scheduled to have surgery 10/15 to try to find out exactly what type of cancer she has. Thank you for everything that you done.

## 2018-01-26 ENCOUNTER — Telehealth: Payer: Self-pay

## 2018-01-26 ENCOUNTER — Inpatient Hospital Stay: Payer: Medicare Other | Admitting: Hematology and Oncology

## 2018-01-26 ENCOUNTER — Other Ambulatory Visit: Payer: Medicare Other

## 2018-01-26 ENCOUNTER — Inpatient Hospital Stay: Payer: Medicare Other

## 2018-01-26 ENCOUNTER — Telehealth: Payer: Self-pay | Admitting: Interventional Cardiology

## 2018-01-26 ENCOUNTER — Telehealth: Payer: Self-pay | Admitting: Oncology

## 2018-01-26 NOTE — Telephone Encounter (Signed)
Spoke to patient and informed her that I would forward her message to Dr Tamala Julian and his nurse Anderson Malta).

## 2018-01-26 NOTE — Telephone Encounter (Signed)
Phoned [facility] [phone] to gather more information for surgical clearance form.  Form in Dr [employer] box.

## 2018-01-26 NOTE — Telephone Encounter (Signed)
New Message           Patient is calling today to make sure that Dr. Tamala Julian is aware that the patient has being diagnosed with "Cancer". Patient just want to make sure that her doctor is aware. Pls call and advise.

## 2018-01-26 NOTE — Telephone Encounter (Signed)
Called Carol Ferguson and let her know that her genetic testing results are back.  Discussed that they show she is BRCA1 and BRCA2 negative and that her lifetime risk of breast cancer is 6%.  Advised her that we will mail a copy to her.  She verbalized understanding and agreement.

## 2018-01-27 ENCOUNTER — Telehealth: Payer: Self-pay

## 2018-01-27 NOTE — Telephone Encounter (Signed)
New message:      Carol Ferguson is returning a call from [facility] in reference to Michael's call on yesterday.

## 2018-01-27 NOTE — Telephone Encounter (Signed)
Called and given verbal order from [name] that [facility] may get orders from now on from [place].

## 2018-01-27 NOTE — Telephone Encounter (Signed)
   [facility] Pre-operative Risk Assessment    Request for surgical clearance:  1. What type of surgery is being performed? Diagnostic Laprascopy  2. When is this surgery scheduled? 02/03/18   3. What type of clearance is required (medical clearance vs. Pharmacy clearance to hold med vs. Both)? Medical  4. Are there any medications that need to be held prior to surgery and how long? None   5. Practice name and name of physician performing surgery? [facility], Dr. Crista Elliot   6. What is your office phone number [phone]   7.   What is your office fax number [phone]  8.   Anesthesia type (None, local, MAC, general) ? General   Carol Ferguson M 01/27/2018, 9:29 AM  _________________________________________________________________   (provider comments below)

## 2018-01-27 NOTE — Telephone Encounter (Signed)
Discussed with Dr. Tamala Julian, ok to be off ASA for procedure and she should have stopped Brilinta 09/2017.  Waiting to talk with the pt.

## 2018-01-27 NOTE — Telephone Encounter (Signed)
Nurse with [facility] called and left a message. She is asking if is okay since patient switched to a oncologist at [facility], Dr. Monia Pouch if they get orders from new MD.  Called back and left a message asking nurse to call office. Per [name], may get orders from Dr. Monia Pouch at [facility].

## 2018-01-27 NOTE — Telephone Encounter (Signed)
   Primary Cardiologist: Sinclair Grooms, MD  Chart reviewed as part of pre-operative protocol coverage. Patient was contacted 01/27/2018 in reference to pre-operative risk assessment for pending surgery as outlined below.  Carol Ferguson was last seen on 07/01/17 by Dr. Tamala Julian.  Since that day, Carol Ferguson has done well from cardiac perspective.  No chest pain and no dyspnea.  She stopped Brilinta as instructed 09/2017.  She meets 4 METS with ACC guidelines.   Therefore, based on ACC/AHA guidelines, the patient would be at acceptable risk for the planned procedure without further cardiovascular testing.   I will route this recommendation to the requesting party via Epic fax function and remove from pre-op pool.  Please call with questions.  Cecilie Kicks, NP 01/27/2018, 2:46 PM

## 2018-01-28 ENCOUNTER — Encounter: Payer: Self-pay | Admitting: *Deleted

## 2018-01-28 ENCOUNTER — Encounter: Payer: Self-pay | Admitting: Licensed Clinical Social Worker

## 2018-01-28 ENCOUNTER — Ambulatory Visit: Payer: Self-pay | Admitting: Licensed Clinical Social Worker

## 2018-01-28 DIAGNOSIS — I429 Cardiomyopathy, unspecified: Secondary | ICD-10-CM | POA: Diagnosis not present

## 2018-01-28 DIAGNOSIS — C189 Malignant neoplasm of colon, unspecified: Secondary | ICD-10-CM | POA: Diagnosis not present

## 2018-01-28 DIAGNOSIS — C786 Secondary malignant neoplasm of retroperitoneum and peritoneum: Secondary | ICD-10-CM | POA: Diagnosis not present

## 2018-01-28 DIAGNOSIS — Z1379 Encounter for other screening for genetic and chromosomal anomalies: Secondary | ICD-10-CM

## 2018-01-28 DIAGNOSIS — I2510 Atherosclerotic heart disease of native coronary artery without angina pectoris: Secondary | ICD-10-CM | POA: Diagnosis not present

## 2018-01-28 DIAGNOSIS — Z800 Family history of malignant neoplasm of digestive organs: Secondary | ICD-10-CM

## 2018-01-28 DIAGNOSIS — C562 Malignant neoplasm of left ovary: Secondary | ICD-10-CM

## 2018-01-28 DIAGNOSIS — K56699 Other intestinal obstruction unspecified as to partial versus complete obstruction: Secondary | ICD-10-CM | POA: Diagnosis not present

## 2018-01-28 DIAGNOSIS — Z808 Family history of malignant neoplasm of other organs or systems: Secondary | ICD-10-CM

## 2018-01-28 NOTE — Progress Notes (Signed)
HPI:  Ms. Wiseman was previously seen in the [place] clinic on 01/12/2018 due to a personal history of ovarian cancer and family  history of cancer and concerns regarding a hereditary predisposition to cancer. Please refer to our prior cancer genetics clinic note for more information regarding Ms. Stopa's medical, social and family histories, and our assessment and recommendations, at the time. Ms. Dardis recent genetic test results were disclosed to her, as well as recommendations warranted by these results. These results and recommendations are discussed in more detail below.  CANCER HISTORY:  Oncology History   MSI stable     Disseminated ovarian cancer, left ([place])   12/17/2016 Imaging    US pelvis 1. There appears to be a 2.2 x 1.9 x 2.0 cm mass centered in the cervix, likely extending into the lower uterus. Recommend correlation with physical exam. An MRI could better evaluate. 2. Thickened endometrium measuring 12 mm. Recommend gynecologic consultation and endometrial sampling given patient age. 3. Probable fibroid in the uterus.    12/27/2016 Pathology Results    Endocervix, curettage - BENIGN ENDOCERVICAL MUCOSA AND BENIGN SQUAMOUS MUCOSA    01/01/2017 Imaging    MRI pelvis 1. No classic intermediate to high signal intensity tissue disrupting the hypointense fibrous cervical stroma as typically seen in the setting of cervical carcinoma. However, there is asymmetric thickening of the posterior cervix with anterior displacement of the cervical canal and associated widening and heterogeneity of the endometrial cavity. As such, imaging features are indeterminate by MRI and close follow-up or tissue sampling is recommended. 2. Apparent areas of tethering/scarring between the sigmoid colon posterior uterus/cervix. Diverticuli are noted in the sigmoid colon and this may be related to scarring from prior bouts is diverticulitis. 3. Small volume intraperitoneal free fluid.    01/28/2017 Pathology Results    1. Cervix, biopsy, posterior - SPINDLE CELL LESION CONSISTENT WITH SMOOTH MUSCLE NEOPLASM, SEE COMMENT. 2. Endometrium, curettage - BENIGN SQUAMOUS MUCOSA. - BENIGN SMOOTH MUSCLE. - NO ENDOMETRIUM PRESENT. Microscopic Comment 1. There is a spindle cell lesion with focal atypia. There is no necrosis or increase in mitotic figures. The sample is somewhat limited, but the differential includes a cellular leiomyoma.    01/28/2017 Surgery    Surgery: ultrasound guided dilation of cervix with D&C and cervical biopsy  Surgeons:  Donaciano Eva, MD  Operative findings: 6cm uterus, dilated endometrial cavity, very stenotic cervical os. Thickened, fibrotic cervix without discrete mass, grossly normal cervical mucosa.       03/22/2017 Imaging    CT abdomen and pelvis 1. Diffuse prominent fluid-filled small bowel with mesenteric edema, enteritis pattern. This may be infectious or inflammatory. There is no evidence of obstruction. 2. Mild omental stranding in the left abdomen likely reactive secondary to primary bowel process, omental caking is not entirely excluded. 3. Sigmoid colonic diverticulosis without diverticulitis. 4. Prominent heterogeneous endometrium, recommend correlation with recent D and C. 5.  Aortic Atherosclerosis (ICD10-I70.0).    06/26/2017 Imaging    US pelvis T/V images. Anteverted and heterogenous uterus measuring 5.85 x 3.87 x 3.36 cm. Right subserous fibroid with calcifications measuring 2.7 x 2.6 cm. Endometrial line appears thickened at 8.1 mm. Cervix appears normal, no mass seen. Right ovary not seen. Left adnexal mass cystic/solid measuring 7.6 x 6.0 x 7.1 cm. Positive color flow Doppler.No free fluid in the posterior cul-de-sac.    06/26/2017 Tumor Marker    Patient's tumor was tested for the following markers: CA-125 Results of the tumor  marker test revealed 90    06/26/2017 Tumor Marker    Patient's tumor was tested for  the following markers: CEA Results of the tumor marker test revealed 4.5    06/27/2017 Imaging    Ct abdomen and pelvis New 7.4 cm left adnexal mass with worsening abdominal ascites and omental nodularity, suggesting metastatic ovarian carcinoma.    07/02/2017 Pathology Results    Cervix, biopsy, ectocervix - SQUAMOUS MUCOSA WITH ATYPICAL INFILTRATE WITHIN FIBROMUSCULAR STROMA. - SEE MICROSCOPIC DESCRIPTION. Microscopic Comment There is a small fragment of squamous mucosa with a small portion of benign squamous epithelium. Within the fibromuscular stroma there are strands and nests of epithelioid cells, which have an infiltrating pattern and there are a few with cytoplasmic vacuoles. The findings are worrisome for an infiltrating neoplastic process and additional tissue may be indicated. There is insufficient tissue remaining in the current biopsy for additional studies.    07/11/2017 Procedure    CT-guided biopsy of the peritoneal thickening in the left lateral abdomen. Procedure was technically challenging due to the small amount of peritoneal thickening and close proximity of multiple bowel loops. False negative biopsy is possible.    07/11/2017 Pathology Results    Soft Tissue Needle Core Biopsy, omentum - ADENOCARCINOMA. - SEE COMMENT. Microscopic Comment The malignant cells are positive for CDX-2, cytokeratin 20, and p53. They are negative for cytokeratin 7, estrogen receptor, and PAX-8. This immunohistochemical profile argues against a gynecologic primary, and the differential diagnosis includes gastrointestinal primary.    07/12/2017 Imaging    Ct abdomen and pelvis 1. New small bowel obstruction with transition point seen in the right lower quadrant, possibly secondary to adhesions related to peritoneal carcinomatosis. No pneumatosis. 2. Findings suggestive of metastatic ovarian cancer. Slightly worsened abdominal ascites may be related to metastatic disease or reactive to the  small-bowel obstruction. 3. Expected post biopsy changes in the left lower quadrant adjacent to peritoneal nodularity. No intra-abdominal hemorrhage.    07/12/2017 - 07/14/2017 Hospital Admission    She was admitted for management of subacute bowel obstruction    07/15/2017 Cancer Staging    Staging form: Ovary, Fallopian Tube, and Primary Peritoneal Carcinoma, AJCC 8th Edition - Clinical: cT3, cN0, cM0 - Signed by Heath Lark, MD on 10/09/2017    07/18/2017 Procedure    Successful placement of a right arm PICC with sonographic and fluoroscopic guidance. The catheter is ready for use.    08/04/2017 Imaging    The previously noted ascites in the abdomen and pelvis has resolved. 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  No small bowel or colonic obstruction is identified.  Generalized thickened bowel wall of the sigmoid colon.  Mixed cystic and solid mass of the left ovary is unchanged.  Status post prior cholecystectomy with mild postsurgical intrahepatic biliary ductal dilatation.     09/29/2017 Imaging    1. Interval resolution of small bowel obstruction. 2. Persistent findings of peritoneal carcinomatosis with improvement in ascites and probable improvement in the previously demonstrated ill-defined right lower quadrant mass. 3. Grossly stable complex left adnexal mass from recent prior studies. This was first demonstrated on the exam done 06/27/2017 and has enlarged since then, consistent with an ovarian metastasis. This abuts the sigmoid colon and may invade it. 4. Bladder wall thickening and surrounding soft tissue stranding consistent with cystitis, possibly treatment related.     10/06/2017 Imaging    Incomplete barium enema due to severe, near complete obstruction of an irregular process in the sigmoid  colon. The irregularity of this lesion is most concerning for colon cancer.    10/09/2017 Tumor Marker    Patient's tumor was tested for the  following markers: CA-125 Results of the tumor marker test revealed 29.4    10/10/2017 -  Chemotherapy    The patient had carboplatin and Taxol    10/31/2017 Tumor Marker    Patient's tumor was tested for the following markers: CA-125 Results of the tumor marker test revealed 27.2    11/21/2017 Tumor Marker    Patient's tumor was tested for the following markers: CA-125 Results of the tumor marker test revealed 25.6    12/12/2017 Imaging    1. Interval increase in size of complex left adnexal mass. 2. Similar-appearing peritoneal carcinomatosis and small volume ascites.    01/05/2018 Tumor Marker    Patient's tumor was tested for the following markers: CA-125 Results of the tumor marker test revealed 44.1    01/09/2018 Imaging    1. Stable cystic and solid enhancing mass predominantly in the LEFT adnexal region but also extends the RIGHT adnexal region. 2. Stable peritoneal carcinomatosis along the ventral and deep pelvic peritoneal surfaces as well as the greater omentum. 3. Small volume free fluid along the RIGHT hepatic margin not changed. 4. No bowel obstruction.    01/12/2018 Tumor Marker    Patient's tumor was tested for the following markers: CA-125 Results of the tumor marker test revealed 45.3    01/15/2018 Echocardiogram    LV EF: 50% -   55%    01/19/2018 Genetic Testing    Negative genetic testing on the [facility] panel. The [facility] gene panel offered by [employer] includes sequencing and deletion/duplication testing of the following 35 genes: APC, ATM, AXIN2, BARD1, BMPR1A, BRCA1, BRCA2, BRIP1, CHD1, CDK4, CDKN2A, CHEK2, EPCAM (large rearrangement only), HOXB13, GALNT12, MLH1, MSH2, MSH3, MSH6, MUTYH, NBN, NTHL1, PALB2, PMS2, PTEN, RAD51C, RAD51D, RNF43, RPS20, SMAD4, STK11, and TP53. Sequencing was performed for select regions of POLE and POLD1, and large rearrangement analysis was performed for select regions of GREM1. The report date is 01/19/2018.    [facility] HRD was ordered on her biopsy, but pathology said there was insufficient material to send, so this testing was cancelled.     01/22/2018 -  Chemotherapy    The patient had bevacizumab (AVASTIN) 525 mg in sodium chloride 0.9 % 100 mL chemo infusion, 10 mg/kg = 525 mg, Intravenous,  Once, 0 of 6 cycles DOXOrubicin HCL LIPOSOMAL (DOXIL) 62 mg in dextrose 5 % 250 mL chemo infusion, 40 mg/m2 = 62 mg, Intravenous,  Once, 0 of 6 cycles  for chemotherapy treatment.       FAMILY HISTORY:  We obtained a detailed, 4-generation family history.  Significant diagnoses are listed below: Family History  Problem Relation Age of Onset  . COPD Mother        d. 4  . Melanoma Brother        dx 30, d 62, stage IV  . Heart disease Father        d. 2s  . Stroke Father   . Heart disease Brother        d. 62  . Diabetes Brother   . Heart attack Brother   . Heart failure Brother   . Throat cancer Maternal Aunt        d. 45  . Colon cancer Neg Hx   . Liver cancer Neg Hx   . Pancreatic cancer Neg Hx   .  Rectal cancer Neg Hx   . Stomach cancer Neg Hx     Ms. Bley does not have children. She had two brothers, one passed away at 25 from heart issues/diabetes complications, and the other was diagnosed with melanoma at 57 and died at 52. Both of her brothers had a son and a daughter each, no cancer history.   Ms. Salvaggio does not have much information about her father's side. He died at 15 from heart issues. She believes he had 3 brothers, no cancer that she knows of. She does not have any information about her paternal grandparents.   Ms. Cerino mother died at 15 from COPD. She had one sister who died at 78 of throat cancer, she was a smoker, no children. Ms. Ertle maternal grandfather suffered from heart issues and grand mal seizures and died at 53. Her maternal grandmother died at 48 of heart failure.    Ms. Mowbray is unaware of previous family history of genetic testing for hereditary  cancer risks. Patient's maternal ancestors are of unknown (caucasian) descent, and paternal ancestors are of unknown (caucasian) descent. There is no reported Ashkenazi Jewish ancestry. There is no known consanguinity.  GENETIC TEST RESULTS: Genetic testing performed through [facility] panel reported out on 01/19/2018 showed no pathogenic mutations. The [facility] gene panel offered by [employer] includes sequencing and deletion/duplication testing of the following 35 genes: APC, ATM, AXIN2, BARD1, BMPR1A, BRCA1, BRCA2, BRIP1, CHD1, CDK4, CDKN2A, CHEK2, EPCAM (large rearrangement only), HOXB13, GALNT12, MLH1, MSH2, MSH3, MSH6, MUTYH, NBN, NTHL1, PALB2, PMS2, PTEN, RAD51C, RAD51D, RNF43, RPS20, SMAD4, STK11, and TP53. Sequencing was performed for select regions of POLE and POLD1, and large rearrangement analysis was performed for select regions of GREM1.  The test report will be scanned into EPIC and will be located under the Molecular Pathology section of the Results Review tab. A portion of the result report is included below for reference.       Additionally, [facility] testing was also ordered on Ms. Armon's omentum biopsy. However, there was insufficient material to send to [facility] for testing, so this was cancelled.   We discussed with Ms. Kluger that because current genetic testing is not perfect, it is possible there may be a gene mutation in one of these genes that current testing cannot detect, but that chance is small.  We also discussed, that there could be another gene that has not yet been discovered, or that we have not yet tested, that is responsible for the cancer diagnoses in the family. It is also possible there is a hereditary cause for the cancer in the family that Ms. Vanauken did not inherit and therefore was not identified in her testing.  Therefore, it is important to remain in touch with cancer genetics in the future so that we can continue to offer Ms. Amato  the most up to date genetic testing.   ADDITIONAL GENETIC TESTING: We discussed with Ms. Galyon that there are other genes that are associated with increased cancer risk that can be analyzed. The laboratories that offer this testing look at these additional genes via a hereditary cancer gene panel. Should Ms. Kilbride wish to pursue additional genetic testing, we are happy to discuss and coordinate this testing, at any time.    CANCER SCREENING RECOMMENDATIONS: Ms. Shutter test result is considered negative (normal).  This means that we have not identified a hereditary cause for her personal and family history of cancer at this time. While reassuring, this  does not definitively rule out a hereditary predisposition to cancer. It is still possible that there could be genetic mutations that are undetectable by current technology, or genetic mutations in genes that have not been tested or identified to increase cancer risk.  Therefore, it is recommended she continue to follow the cancer management and screening guidelines provided by her oncology and primary healthcare provider. An individual's cancer risk is not determined by genetic test results alone.  Overall cancer risk assessment includes additional factors such as personal medical history, family history, etc.  These should be used to make a personalized plan for cancer prevention and surveillance.    RECOMMENDATIONS FOR FAMILY MEMBERS:  Relatives in this family might be at some increased risk of developing cancer, over the general population risk, simply due to the family history of cancer.  We recommended women in this family have a yearly mammogram beginning at age 15, or 17 years younger than the earliest onset of cancer, an annual clinical breast exam, and perform monthly breast self-exams. Women in this family should also have a gynecological exam as recommended by their primary provider. All family members should have a colonoscopy by age 78 (or as  directed by their doctors).  All family members should inform their physicians about the family history of cancer so their doctors can make the most appropriate screening recommendations for them.   FOLLOW-UP: Lastly, we discussed with Ms. Totten that cancer genetics is a rapidly advancing field and it is possible that new genetic tests will be appropriate for her and/or her family members in the future. We encouraged her to remain in contact with cancer genetics on an annual basis so we can update her personal and family histories and let her know of advances in cancer genetics that may benefit this family.   Our contact number was provided. Ms. Shor questions were answered to her satisfaction, and she knows she is welcome to call us at anytime with additional questions or concerns.  Faith Rogue, MS Genetic Counselor [email or web address] Phone: [phone]

## 2018-01-28 NOTE — Progress Notes (Signed)
Records received from [facility].  Given to Dr. Tamala Julian for review.

## 2018-01-30 DIAGNOSIS — C801 Malignant (primary) neoplasm, unspecified: Secondary | ICD-10-CM | POA: Diagnosis not present

## 2018-01-30 DIAGNOSIS — Z955 Presence of coronary angioplasty implant and graft: Secondary | ICD-10-CM | POA: Diagnosis not present

## 2018-02-03 DIAGNOSIS — I2510 Atherosclerotic heart disease of native coronary artery without angina pectoris: Secondary | ICD-10-CM | POA: Diagnosis present

## 2018-02-03 DIAGNOSIS — Z9221 Personal history of antineoplastic chemotherapy: Secondary | ICD-10-CM | POA: Diagnosis not present

## 2018-02-03 DIAGNOSIS — C786 Secondary malignant neoplasm of retroperitoneum and peritoneum: Secondary | ICD-10-CM | POA: Diagnosis present

## 2018-02-03 DIAGNOSIS — E441 Mild protein-calorie malnutrition: Secondary | ICD-10-CM | POA: Diagnosis present

## 2018-02-03 DIAGNOSIS — Z5331 Laparoscopic surgical procedure converted to open procedure: Secondary | ICD-10-CM | POA: Diagnosis not present

## 2018-02-03 DIAGNOSIS — C189 Malignant neoplasm of colon, unspecified: Secondary | ICD-10-CM | POA: Diagnosis present

## 2018-02-03 DIAGNOSIS — C801 Malignant (primary) neoplasm, unspecified: Secondary | ICD-10-CM | POA: Diagnosis not present

## 2018-02-03 DIAGNOSIS — Z840 Family history of diseases of the skin and subcutaneous tissue: Secondary | ICD-10-CM | POA: Diagnosis not present

## 2018-02-03 DIAGNOSIS — F329 Major depressive disorder, single episode, unspecified: Secondary | ICD-10-CM | POA: Diagnosis present

## 2018-02-03 DIAGNOSIS — I252 Old myocardial infarction: Secondary | ICD-10-CM | POA: Diagnosis not present

## 2018-02-03 DIAGNOSIS — Z87891 Personal history of nicotine dependence: Secondary | ICD-10-CM | POA: Diagnosis not present

## 2018-02-03 DIAGNOSIS — R188 Other ascites: Secondary | ICD-10-CM | POA: Diagnosis present

## 2018-02-03 DIAGNOSIS — R1909 Other intra-abdominal and pelvic swelling, mass and lump: Secondary | ICD-10-CM | POA: Diagnosis not present

## 2018-02-03 DIAGNOSIS — I959 Hypotension, unspecified: Secondary | ICD-10-CM | POA: Diagnosis not present

## 2018-02-03 DIAGNOSIS — G8918 Other acute postprocedural pain: Secondary | ICD-10-CM | POA: Diagnosis present

## 2018-02-03 DIAGNOSIS — Z955 Presence of coronary angioplasty implant and graft: Secondary | ICD-10-CM | POA: Diagnosis not present

## 2018-02-03 DIAGNOSIS — R1900 Intra-abdominal and pelvic swelling, mass and lump, unspecified site: Secondary | ICD-10-CM | POA: Diagnosis not present

## 2018-02-03 DIAGNOSIS — R180 Malignant ascites: Secondary | ICD-10-CM | POA: Diagnosis not present

## 2018-02-03 DIAGNOSIS — Z800 Family history of malignant neoplasm of digestive organs: Secondary | ICD-10-CM | POA: Diagnosis not present

## 2018-02-03 DIAGNOSIS — Z681 Body mass index (BMI) 19 or less, adult: Secondary | ICD-10-CM | POA: Diagnosis not present

## 2018-02-09 MED ORDER — MIRTAZAPINE 15 MG PO TABS
15.00 | ORAL_TABLET | ORAL | Status: DC
Start: 2018-02-09 — End: 2018-02-09

## 2018-02-09 MED ORDER — OXYCODONE HCL 5 MG PO TABS
5.00 | ORAL_TABLET | ORAL | Status: DC
Start: ? — End: 2018-02-09

## 2018-02-09 MED ORDER — ONDANSETRON 4 MG PO TBDP
4.00 | ORAL_TABLET | ORAL | Status: DC
Start: ? — End: 2018-02-09

## 2018-02-09 MED ORDER — ATORVASTATIN CALCIUM 10 MG PO TABS
10.00 | ORAL_TABLET | ORAL | Status: DC
Start: 2018-02-09 — End: 2018-02-09

## 2018-02-09 MED ORDER — FLUOXETINE HCL 20 MG PO CAPS
20.00 | ORAL_CAPSULE | ORAL | Status: DC
Start: 2018-02-10 — End: 2018-02-09

## 2018-02-09 MED ORDER — SENNOSIDES-DOCUSATE SODIUM 8.6-50 MG PO TABS
2.00 | ORAL_TABLET | ORAL | Status: DC
Start: 2018-02-09 — End: 2018-02-09

## 2018-02-09 MED ORDER — POLYETHYLENE GLYCOL 3350 17 G PO PACK
17.00 | PACK | ORAL | Status: DC
Start: 2018-02-10 — End: 2018-02-09

## 2018-02-09 MED ORDER — PROCHLORPERAZINE MALEATE 10 MG PO TABS
10.00 | ORAL_TABLET | ORAL | Status: DC
Start: ? — End: 2018-02-09

## 2018-02-09 MED ORDER — ASPIRIN EC 81 MG PO TBEC
81.00 | DELAYED_RELEASE_TABLET | ORAL | Status: DC
Start: 2018-02-10 — End: 2018-02-09

## 2018-02-09 MED ORDER — ENOXAPARIN SODIUM 40 MG/0.4ML ~~LOC~~ SOLN
40.00 | SUBCUTANEOUS | Status: DC
Start: 2018-02-09 — End: 2018-02-09

## 2018-02-09 MED ORDER — OLANZAPINE 2.5 MG PO TABS
2.50 | ORAL_TABLET | ORAL | Status: DC
Start: 2018-02-09 — End: 2018-02-09

## 2018-02-09 MED ORDER — MENTHOL 7.6 MG MT LOZG
1.00 | LOZENGE | OROMUCOSAL | Status: DC
Start: ? — End: 2018-02-09

## 2018-02-09 MED ORDER — ACETAMINOPHEN 325 MG PO TABS
975.00 | ORAL_TABLET | ORAL | Status: DC
Start: 2018-02-09 — End: 2018-02-09

## 2018-02-09 MED ORDER — IBUPROFEN 200 MG PO TABS
600.00 | ORAL_TABLET | ORAL | Status: DC
Start: 2018-02-09 — End: 2018-02-09

## 2018-02-16 DIAGNOSIS — Z87891 Personal history of nicotine dependence: Secondary | ICD-10-CM | POA: Diagnosis not present

## 2018-02-16 DIAGNOSIS — Z5181 Encounter for therapeutic drug level monitoring: Secondary | ICD-10-CM | POA: Diagnosis not present

## 2018-02-16 DIAGNOSIS — C786 Secondary malignant neoplasm of retroperitoneum and peritoneum: Secondary | ICD-10-CM | POA: Diagnosis not present

## 2018-02-16 DIAGNOSIS — Z79899 Other long term (current) drug therapy: Secondary | ICD-10-CM | POA: Diagnosis not present

## 2018-02-16 DIAGNOSIS — C763 Malignant neoplasm of pelvis: Secondary | ICD-10-CM | POA: Diagnosis not present

## 2018-02-24 DIAGNOSIS — Z5111 Encounter for antineoplastic chemotherapy: Secondary | ICD-10-CM | POA: Diagnosis not present

## 2018-02-24 DIAGNOSIS — C801 Malignant (primary) neoplasm, unspecified: Secondary | ICD-10-CM | POA: Diagnosis not present

## 2018-03-02 ENCOUNTER — Other Ambulatory Visit: Payer: Self-pay | Admitting: Interventional Cardiology

## 2018-03-06 ENCOUNTER — Encounter (HOSPITAL_COMMUNITY): Payer: Self-pay | Admitting: Emergency Medicine

## 2018-03-06 ENCOUNTER — Emergency Department (HOSPITAL_COMMUNITY): Payer: Medicare Other

## 2018-03-06 ENCOUNTER — Other Ambulatory Visit: Payer: Self-pay

## 2018-03-06 ENCOUNTER — Inpatient Hospital Stay (HOSPITAL_COMMUNITY)
Admission: EM | Admit: 2018-03-06 | Discharge: 2018-03-08 | DRG: 388 | Disposition: A | Discharge status: Home / Self Care | Payer: Medicare Other | Attending: Internal Medicine | Admitting: Internal Medicine

## 2018-03-06 DIAGNOSIS — Z9049 Acquired absence of other specified parts of digestive tract: Secondary | ICD-10-CM

## 2018-03-06 DIAGNOSIS — Z808 Family history of malignant neoplasm of other organs or systems: Secondary | ICD-10-CM | POA: Diagnosis not present

## 2018-03-06 DIAGNOSIS — Z823 Family history of stroke: Secondary | ICD-10-CM

## 2018-03-06 DIAGNOSIS — L8990 Pressure ulcer of unspecified site, unspecified stage: Secondary | ICD-10-CM

## 2018-03-06 DIAGNOSIS — F329 Major depressive disorder, single episode, unspecified: Secondary | ICD-10-CM | POA: Diagnosis present

## 2018-03-06 DIAGNOSIS — Z791 Long term (current) use of non-steroidal anti-inflammatories (NSAID): Secondary | ICD-10-CM

## 2018-03-06 DIAGNOSIS — I491 Atrial premature depolarization: Secondary | ICD-10-CM | POA: Diagnosis not present

## 2018-03-06 DIAGNOSIS — R000 Tachycardia, unspecified: Secondary | ICD-10-CM | POA: Diagnosis not present

## 2018-03-06 DIAGNOSIS — I255 Ischemic cardiomyopathy: Secondary | ICD-10-CM | POA: Diagnosis present

## 2018-03-06 DIAGNOSIS — Z95828 Presence of other vascular implants and grafts: Secondary | ICD-10-CM

## 2018-03-06 DIAGNOSIS — R52 Pain, unspecified: Secondary | ICD-10-CM | POA: Diagnosis not present

## 2018-03-06 DIAGNOSIS — Z825 Family history of asthma and other chronic lower respiratory diseases: Secondary | ICD-10-CM

## 2018-03-06 DIAGNOSIS — K56609 Unspecified intestinal obstruction, unspecified as to partial versus complete obstruction: Secondary | ICD-10-CM

## 2018-03-06 DIAGNOSIS — I2510 Atherosclerotic heart disease of native coronary artery without angina pectoris: Secondary | ICD-10-CM | POA: Diagnosis present

## 2018-03-06 DIAGNOSIS — D539 Nutritional anemia, unspecified: Secondary | ICD-10-CM | POA: Diagnosis not present

## 2018-03-06 DIAGNOSIS — R0902 Hypoxemia: Secondary | ICD-10-CM | POA: Diagnosis not present

## 2018-03-06 DIAGNOSIS — E876 Hypokalemia: Secondary | ICD-10-CM | POA: Diagnosis present

## 2018-03-06 DIAGNOSIS — I252 Old myocardial infarction: Secondary | ICD-10-CM | POA: Diagnosis not present

## 2018-03-06 DIAGNOSIS — R1084 Generalized abdominal pain: Secondary | ICD-10-CM

## 2018-03-06 DIAGNOSIS — Z833 Family history of diabetes mellitus: Secondary | ICD-10-CM

## 2018-03-06 DIAGNOSIS — I959 Hypotension, unspecified: Secondary | ICD-10-CM | POA: Diagnosis not present

## 2018-03-06 DIAGNOSIS — Z973 Presence of spectacles and contact lenses: Secondary | ICD-10-CM

## 2018-03-06 DIAGNOSIS — Z452 Encounter for adjustment and management of vascular access device: Secondary | ICD-10-CM | POA: Diagnosis not present

## 2018-03-06 DIAGNOSIS — Z7982 Long term (current) use of aspirin: Secondary | ICD-10-CM

## 2018-03-06 DIAGNOSIS — C800 Disseminated malignant neoplasm, unspecified: Secondary | ICD-10-CM

## 2018-03-06 DIAGNOSIS — R110 Nausea: Secondary | ICD-10-CM | POA: Diagnosis not present

## 2018-03-06 DIAGNOSIS — Z8249 Family history of ischemic heart disease and other diseases of the circulatory system: Secondary | ICD-10-CM

## 2018-03-06 DIAGNOSIS — C786 Secondary malignant neoplasm of retroperitoneum and peritoneum: Secondary | ICD-10-CM | POA: Diagnosis present

## 2018-03-06 DIAGNOSIS — R109 Unspecified abdominal pain: Secondary | ICD-10-CM

## 2018-03-06 DIAGNOSIS — R140 Abdominal distension (gaseous): Secondary | ICD-10-CM | POA: Diagnosis not present

## 2018-03-06 DIAGNOSIS — E43 Unspecified severe protein-calorie malnutrition: Secondary | ICD-10-CM | POA: Diagnosis not present

## 2018-03-06 DIAGNOSIS — F419 Anxiety disorder, unspecified: Secondary | ICD-10-CM | POA: Diagnosis present

## 2018-03-06 DIAGNOSIS — Z9221 Personal history of antineoplastic chemotherapy: Secondary | ICD-10-CM

## 2018-03-06 DIAGNOSIS — I25118 Atherosclerotic heart disease of native coronary artery with other forms of angina pectoris: Secondary | ICD-10-CM

## 2018-03-06 DIAGNOSIS — K56600 Partial intestinal obstruction, unspecified as to cause: Secondary | ICD-10-CM | POA: Diagnosis not present

## 2018-03-06 DIAGNOSIS — Z87891 Personal history of nicotine dependence: Secondary | ICD-10-CM

## 2018-03-06 DIAGNOSIS — R64 Cachexia: Secondary | ICD-10-CM | POA: Diagnosis present

## 2018-03-06 DIAGNOSIS — Z5329 Procedure and treatment not carried out because of patient's decision for other reasons: Secondary | ICD-10-CM | POA: Diagnosis present

## 2018-03-06 DIAGNOSIS — Z681 Body mass index (BMI) 19 or less, adult: Secondary | ICD-10-CM

## 2018-03-06 DIAGNOSIS — Z881 Allergy status to other antibiotic agents status: Secondary | ICD-10-CM

## 2018-03-06 DIAGNOSIS — K567 Ileus, unspecified: Principal | ICD-10-CM | POA: Diagnosis present

## 2018-03-06 DIAGNOSIS — Z955 Presence of coronary angioplasty implant and graft: Secondary | ICD-10-CM

## 2018-03-06 DIAGNOSIS — I5032 Chronic diastolic (congestive) heart failure: Secondary | ICD-10-CM | POA: Diagnosis present

## 2018-03-06 DIAGNOSIS — K5669 Other partial intestinal obstruction: Secondary | ICD-10-CM | POA: Diagnosis not present

## 2018-03-06 DIAGNOSIS — E785 Hyperlipidemia, unspecified: Secondary | ICD-10-CM | POA: Diagnosis present

## 2018-03-06 DIAGNOSIS — Z79899 Other long term (current) drug therapy: Secondary | ICD-10-CM

## 2018-03-06 DIAGNOSIS — C801 Malignant (primary) neoplasm, unspecified: Secondary | ICD-10-CM

## 2018-03-06 DIAGNOSIS — L89151 Pressure ulcer of sacral region, stage 1: Secondary | ICD-10-CM | POA: Diagnosis present

## 2018-03-06 DIAGNOSIS — Z8543 Personal history of malignant neoplasm of ovary: Secondary | ICD-10-CM

## 2018-03-06 DIAGNOSIS — Z66 Do not resuscitate: Secondary | ICD-10-CM | POA: Diagnosis present

## 2018-03-06 DIAGNOSIS — R739 Hyperglycemia, unspecified: Secondary | ICD-10-CM | POA: Diagnosis present

## 2018-03-06 LAB — CBC WITH DIFFERENTIAL/PLATELET
Abs Immature Granulocytes: 0.04 10*3/uL (ref 0.00–0.07)
BASOS ABS: 0 10*3/uL (ref 0.0–0.1)
Basophils Relative: 0 %
Eosinophils Absolute: 0 10*3/uL (ref 0.0–0.5)
Eosinophils Relative: 0 %
HCT: 40.1 % (ref 36.0–46.0)
Hemoglobin: 13.4 g/dL (ref 12.0–15.0)
IMMATURE GRANULOCYTES: 0 %
Lymphocytes Relative: 14 %
Lymphs Abs: 1.5 10*3/uL (ref 0.7–4.0)
MCH: 33.9 pg (ref 26.0–34.0)
MCHC: 33.4 g/dL (ref 30.0–36.0)
MCV: 101.5 fL — ABNORMAL HIGH (ref 80.0–100.0)
Monocytes Absolute: 1.1 10*3/uL — ABNORMAL HIGH (ref 0.1–1.0)
Monocytes Relative: 11 %
NEUTROS PCT: 75 %
Neutro Abs: 7.8 10*3/uL — ABNORMAL HIGH (ref 1.7–7.7)
PLATELETS: 234 10*3/uL (ref 150–400)
RBC: 3.95 MIL/uL (ref 3.87–5.11)
RDW: 12.9 % (ref 11.5–15.5)
WBC: 10.5 10*3/uL (ref 4.0–10.5)
nRBC: 0 % (ref 0.0–0.2)

## 2018-03-06 LAB — COMPREHENSIVE METABOLIC PANEL
ALBUMIN: 3.4 g/dL — AB (ref 3.5–5.0)
ALT: 29 U/L (ref 0–44)
AST: 24 U/L (ref 15–41)
Alkaline Phosphatase: 103 U/L (ref 38–126)
Anion gap: 11 (ref 5–15)
BUN: 35 mg/dL — ABNORMAL HIGH (ref 8–23)
CHLORIDE: 95 mmol/L — AB (ref 98–111)
CO2: 32 mmol/L (ref 22–32)
Calcium: 8.7 mg/dL — ABNORMAL LOW (ref 8.9–10.3)
Creatinine, Ser: 0.85 mg/dL (ref 0.44–1.00)
GFR calc Af Amer: >60 mL/min (ref >60)
GFR calc non Af Amer: >60 mL/min (ref >60)
GLUCOSE: 160 mg/dL — AB (ref 70–99)
POTASSIUM: 3.5 mmol/L (ref 3.5–5.1)
SODIUM: 138 mmol/L (ref 135–145)
Total Bilirubin: 0.7 mg/dL (ref 0.3–1.2)
Total Protein: 7 g/dL (ref 6.5–8.1)

## 2018-03-06 LAB — LIPASE, BLOOD: Lipase: 23 U/L (ref 11–51)

## 2018-03-06 MED ORDER — MORPHINE SULFATE (PF) 4 MG/ML IV SOLN
4.0000 mg | Freq: Once | INTRAVENOUS | Status: AC
  Administered 2018-03-06: 4 mg via INTRAVENOUS
  Filled 2018-03-06: qty 1

## 2018-03-06 MED ORDER — SODIUM CHLORIDE 0.9 % IV BOLUS
1000.0000 mL | Freq: Once | INTRAVENOUS | Status: AC
  Administered 2018-03-06: 1000 mL via INTRAVENOUS

## 2018-03-06 MED ORDER — ONDANSETRON HCL 4 MG/2ML IJ SOLN
4.0000 mg | Freq: Once | INTRAMUSCULAR | Status: AC
  Administered 2018-03-06: 4 mg via INTRAVENOUS
  Filled 2018-03-06: qty 2

## 2018-03-06 NOTE — ED Triage Notes (Signed)
Patient presents due to increased abdominal pain for the last 3 days. Her last bowel movement was 4 days ago. She has a history of colon cancer and her last chemotherapy treatment was 02/24/18. Around 1900 she took her home dose of Morphine and Milk of Magnesia.

## 2018-03-06 NOTE — ED Provider Notes (Signed)
[facility] DEPT Provider Note   CSN: [id number] Arrival date & time: 03/06/18  2036     History   Chief Complaint Chief Complaint  Patient presents with  . Abdominal Pain    HPI Carol Ferguson is a 67 y.o. female who presents with abdominal pain. PMH significant for disseminated ovarian cancer, adenocarcinoma of the colon s/p diagnostic laparoscopy, exploratory laparotomy, omentectomy on 02/03/18 at [facility] and currently undergoing chemo, CAD, hx of bowel obstruction. The patient reports worsening of her chronic nausea especially after chemo on 11/5. She hasn't been eating or drinking and feels generally weak. For the past three days she reports generalized abdominal pain as well as constipation. Hasn't had a BM in three days as well. She is passing gas. She tried multiple laxatives without success. She is worried she may have a bowel blockage again. She is taking Morphine regularly. She called her oncologist and they recommended to try the laxatives come to the ED if worsening. No fever, vomiting, chest pain, SOB, urinary symptoms.   HPI  Past Medical History:  Diagnosis Date  . Anemia   . Anxiety   . 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 CAD (coronary artery disease)    a. Inf STEMI/LHC 09/2016 which showed occluded mid LCx treated with PCI, DES; remaining cath details included a proximal to mid LAD lesion of 20% stenosis, and proximal to mid RCA 25% stenosis. EF 55-60%.  . Cancer ([place])   . Clotting disorder ([place])   . Depression   . Diverticulosis   . Family history of melanoma   . Family history of throat cancer   . Gallstones   . Ischemic cardiomyopathy    a. 2D Echo 09/28/16 showed EF 55-60%, probable severe hypokinesis of the entire inferiormyocardium, normal diastolic parameters.  . Myocardial infarction ([place]) 09/26/2016  . Pneumonia   . Presence of tooth-root and mandibular implants   . Tobacco abuse   . Wears glasses     Patient  Active Problem List   Diagnosis Date Noted  . Genetic testing 01/20/2018  . Encounter for antineoplastic chemotherapy 01/13/2018  . Family history of melanoma   . Family history of throat cancer   . Pancytopenia, acquired ([place]) 01/05/2018  . Depression 09/21/2017  . Other insomnia 09/21/2017  . Abdominal pain   . Terminal care   . Abdominal distention   . Palliative care by specialist   . Protein-calorie malnutrition, severe 08/15/2017  . Other constipation 08/04/2017  . Peripheral neuropathy due to chemotherapy ([place]) 08/04/2017  . Mild protein-calorie malnutrition ([place]) 08/04/2017  . Anxiety state 07/16/2017  . Nausea without vomiting 07/16/2017  . Weight loss 07/16/2017  . Disseminated ovarian cancer, left ([place]) 07/15/2017  . Goals of care, counseling/discussion 07/15/2017  . SBO (small bowel obstruction) ([place]) 07/12/2017  . Left tubo-ovarian mass 07/02/2017  . Peritoneal carcinomatosis ([place]) 07/02/2017  . Diverticulosis 01/10/2017  . Cervical mass 12/27/2016  . CAD (coronary artery disease), native coronary artery 11/27/2016  . Chronic diastolic heart failure ([place]) 09/27/2016  . Hyperlipidemia with target LDL less than 70 09/27/2016  . 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 Lumbar degenerative disc disease 06/14/2016    Past Surgical History:  Procedure Laterality Date  . CERVICAL CONIZATION W/BX N/A 01/28/2017   Procedure: CONIZATION CERVIX WITH BIOPSY;  Surgeon: [name], MD;  Location: [facility];  Service: Gynecology;  Laterality: N/A;  . CHOLECYSTECTOMY    .  CORONARY STENT INTERVENTION N/A 09/26/2016   Procedure: Coronary Stent Intervention;  Surgeon: Belva Crome, MD;  Location: [facility];  Service: Cardiovascular;  Laterality: N/A;  . DILATION AND CURETTAGE OF UTERUS N/A 01/28/2017   Procedure: DILATATION AND CURETTAGE WITH ULTRASOUND GUIDENCE;  Surgeon: [name], MD;  Location: [facility];   Service: Gynecology;  Laterality: N/A;  . LEFT HEART CATH AND CORONARY ANGIOGRAPHY N/A 09/26/2016   Procedure: Left Heart Cath and Coronary Angiography;  Surgeon: Belva Crome, MD;  Location: [place] [facility];  Service: Cardiovascular;  Laterality: N/A;     OB History    Gravida  0   Para  0   Term  0   Preterm  0   AB  0   Living  0     SAB  0   TAB  0   Ectopic  0   Multiple  0   Live Births  0            Home Medications    Prior to Admission medications   Medication Sig Start Date End Date Taking? Authorizing Provider  aspirin EC 81 MG tablet Take 81 mg by mouth daily.    [provider]  dexamethasone (DECADRON) 4 MG tablet Take 5 tabs at the night before and 5 tab the morning of chemotherapy, every 3 weeks, by mouth 01/05/18   Heath Lark, MD  FLUoxetine (PROZAC) 20 MG capsule Take 1 capsule (20 mg total) by mouth daily. 10/30/17   Tenna Delaine D, PA-C  LORazepam (ATIVAN) 1 MG tablet Take 1 tablet (1 mg total) by mouth every 8 (eight) hours as needed for anxiety or sleep. 11/21/17   Heath Lark, MD  mirtazapine (REMERON) 15 MG tablet TAKE ONE TABLET BY MOUTH AT BEDTIME 01/20/18   Heath Lark, MD  morphine (MSIR) 15 MG tablet Take 1 tablet (15 mg total) by mouth every 4 (four) hours as needed for severe pain. 12/30/17   Heath Lark, MD  ondansetron (ZOFRAN) 8 MG tablet Take 1 tablet (8 mg total) by mouth every 8 (eight) hours as needed for nausea. 10/09/17   Heath Lark, MD  prochlorperazine (COMPAZINE) 10 MG tablet Take 1 tablet (10 mg total) by mouth every 6 (six) hours as needed for nausea or vomiting. 10/31/17   Heath Lark, MD  sennosides-docusate sodium (SENOKOT-S) 8.6-50 MG tablet Take 2-4 tablets by mouth 2 (two) times daily.    [provider]    Family History Family History  Problem Relation Age of Onset  . COPD Mother        d. 63  . Melanoma Brother        dx 75, d 13, stage IV  . Heart disease Father        d. 69s  .  Stroke Father   . Heart disease Brother        d. 48  . Diabetes Brother   . Heart attack Brother   . Heart failure Brother   . Throat cancer Maternal Aunt        d. 30  . Colon cancer Neg Hx   . Liver cancer Neg Hx   . Pancreatic cancer Neg Hx   . Rectal cancer Neg Hx   . Stomach cancer Neg Hx     Social History Social History   Tobacco Use  . Smoking status: Former Smoker    Packs/day: 1.00    Years: 35.00  Pack years: 35.00    Types: Cigarettes    Last attempt to quit: 09/26/2016    Years since quitting: 1.4  . Smokeless tobacco: Never Used  Substance Use Topics  . Alcohol use: Not Currently  . Drug use: No     Allergies   Flagyl [metronidazole]   Review of Systems Review of Systems  Constitutional: Negative for fever.  Respiratory: Negative for shortness of breath.   Cardiovascular: Negative for chest pain.  Gastrointestinal: Positive for abdominal pain, constipation and nausea. Negative for abdominal distention, diarrhea and vomiting.  Genitourinary: Negative for dysuria and flank pain.  All other systems reviewed and are negative.    Physical Exam Updated Vital Signs BP 119/64   Pulse 98   Temp 98 F (36.7 C) (Oral)   Resp 16   SpO2 97%   Physical Exam  Constitutional: She is oriented to person, place, and time. She appears well-developed and well-nourished. No distress.  Calm, cooperative, thin  HENT:  Head: Normocephalic and atraumatic.  Eyes: Pupils are equal, round, and reactive to light. Conjunctivae are normal. Right eye exhibits no discharge. Left eye exhibits no discharge. No scleral icterus.  Neck: Normal range of motion.  Cardiovascular: Regular rhythm. Tachycardia present.  Pulmonary/Chest: Effort normal and breath sounds normal. No respiratory distress.  Abdominal: Soft. She exhibits no distension. Bowel sounds are increased. There is no tenderness.  Abdomen is firm  Genitourinary:  Genitourinary Comments: Rectal: No gross blood,  hemorrhoids, fissures, redness, area of fluctuance, lesions, or tenderness. Chaperone present during exam.  Neurological: She is alert and oriented to person, place, and time.  Skin: Skin is warm and dry.  Psychiatric: She has a normal mood and affect. Her behavior is normal.  Nursing note and vitals reviewed.    ED Treatments / Results  Labs (all labs ordered are listed, but only abnormal results are displayed) Labs Reviewed  CBC WITH DIFFERENTIAL/PLATELET - Abnormal; Notable for the following components:      Result Value   MCV 101.5 (*)    Neutro Abs 7.8 (*)    Monocytes Absolute 1.1 (*)    All other components within normal limits  COMPREHENSIVE METABOLIC PANEL - Abnormal; Notable for the following components:   Chloride 95 (*)    Glucose, Bld 160 (*)    BUN 35 (*)    Calcium 8.7 (*)    Albumin 3.4 (*)    All other components within normal limits  LIPASE, BLOOD  URINALYSIS, ROUTINE W REFLEX MICROSCOPIC    EKG None  Radiology Dg Abd 2 Views  Result Date: 03/06/2018 CLINICAL DATA:  Abdominal pain EXAM: ABDOMEN - 2 VIEW COMPARISON:  01/05/2018 FINDINGS: There is small bowel distention with multiple air-fluid levels. There is a small amount air in the colon. There is no evidence of pneumoperitoneum, portal venous gas or pneumatosis. There are no pathologic calcifications along the expected course of the ureters. The osseous structures are unremarkable. IMPRESSION: Small bowel distention with multiple air-fluid levels and air in the colon. Differential considerations include an ileus versus partial small bowel obstruction. Electronically Signed   By: Kathreen Devoid   On: 03/06/2018 22:36    Procedures Procedures (including critical care time)  Medications Ordered in ED Medications  sodium chloride 0.9 % bolus 1,000 mL (1,000 mLs Intravenous New Bag/Given 03/06/18 2127)     Initial Impression / Assessment and Plan / ED Course  I have reviewed the triage vital signs and  the nursing notes.  Pertinent labs &  imaging results that were available during my care of the patient were reviewed by me and considered in my medical decision making (see chart for details).  67 year old female presents with abdominal pain, nausea, constipation which is chronic but worse in the past three days. She is tachycardic on initial eval. After fluids this has resolved. Abdomen is firm but not particularly distended. Rectal is negative for fecal impaction. Labs are overall normal appearing other than hyperglycemia. Abdominal xray was ordered which shows multiple air-fluid levels to suggest ileus vs partial SBO. Discussed with patient. She is refusing NG tube. She also doesn't want telemetry because the monitor is bothering her. Discussed with attending Dr. Zenia Resides. Will admit for further management.  Final Clinical Impressions(s) / ED Diagnoses   Final diagnoses:  Abdominal pain  Generalized abdominal pain  Partial intestinal obstruction, unspecified cause [facility])    ED Discharge Orders    None       Recardo Evangelist, PA-C 03/06/18 2319    Lacretia Leigh, MD 03/09/18 1328

## 2018-03-06 NOTE — ED Notes (Signed)
Bed: VP71 Expected date:  Expected time:  Means of arrival:  Comments: EMS 67 yo female from home Stage IV colon cancer-chemo on Nov 5th-abd pain x 3 days-hx bowel obstruction

## 2018-03-06 NOTE — H&P (Signed)
History and Physical    Carol Ferguson YWV:[id number] DOB: 1950/05/16 DOA: 03/06/2018  PCP: [name], PA-C  Patient coming from: home   Chief Complaint: nausea and abdominal pain  HPI: Carol Ferguson is a 67 y.o. female with medical history significant for CAD (PCI/DES 2018) and adenocarcinoma (likely GI primary) with peritoneal carcinomatosis, currently followed by [facility] oncology on folfox, here for the above.  Has had several similar episodes in the past.  Reports has had several days of intermittent crampy abdominal pain that most localizes to the periumbilical region. Also associated nausea and decreased appetite. No vomiting. Chronic constipation, last stool 3 days ago. Has been passing flatus. Abdominal pain comes and goes. No chest pain, no worsening dyspna. No dysuria.  Recently transferred oncology care to [facility]. Last month had laparoscopic surgery for biopsy to help gain more clarity on tumor origin. Now thought to likely be GI primary. Is currently undergoing Folfox chemotherapy.  ED Course: cxr, fluids, anti-emetics, labs  Review of Systems: As per HPI otherwise 10 point review of systems negative.    Past Medical History:  Diagnosis Date  . Anemia   . Anxiety   . 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 CAD (coronary artery disease)    a. Inf STEMI/LHC 09/2016 which showed occluded mid LCx treated with PCI, DES; remaining cath details included a proximal to mid LAD lesion of 20% stenosis, and proximal to mid RCA 25% stenosis. EF 55-60%.  . Cancer ([place])   . Clotting disorder ([place])   . Depression   . Diverticulosis   . Family history of melanoma   . Family history of throat cancer   . Gallstones   . Ischemic cardiomyopathy    a. 2D Echo 09/28/16 showed EF 55-60%, probable severe hypokinesis of the entire inferiormyocardium, normal diastolic parameters.  . Myocardial infarction ([place]) 09/26/2016  . Pneumonia   . Presence of tooth-root and mandibular  implants   . Tobacco abuse   . Wears glasses     Past Surgical History:  Procedure Laterality Date  . CERVICAL CONIZATION W/BX N/A 01/28/2017   Procedure: CONIZATION CERVIX WITH BIOPSY;  Surgeon: [name], MD;  Location: [facility];  Service: Gynecology;  Laterality: N/A;  . CHOLECYSTECTOMY    . CORONARY STENT INTERVENTION N/A 09/26/2016   Procedure: Coronary Stent Intervention;  Surgeon: Belva Crome, MD;  Location: [place] [facility];  Service: Cardiovascular;  Laterality: N/A;  . DILATION AND CURETTAGE OF UTERUS N/A 01/28/2017   Procedure: DILATATION AND CURETTAGE WITH ULTRASOUND GUIDENCE;  Surgeon: [name], MD;  Location: [facility];  Service: Gynecology;  Laterality: N/A;  . LEFT HEART CATH AND CORONARY ANGIOGRAPHY N/A 09/26/2016   Procedure: Left Heart Cath and Coronary Angiography;  Surgeon: Belva Crome, MD;  Location: [place] [facility];  Service: Cardiovascular;  Laterality: N/A;     reports that she quit smoking about 17 months ago. Her smoking use included cigarettes. She has a 35.00 pack-year smoking history. She has never used smokeless tobacco. She reports that she drank alcohol. She reports that she does not use drugs.  Allergies  Allergen Reactions  . Flagyl [Metronidazole] Nausea Only    Family History  Problem Relation Age of Onset  . COPD Mother        d. 15  . Melanoma Brother        dx 45, d 90, stage IV  . Heart disease Father  d. 80s  . Stroke Father   . Heart disease Brother        d. 32  . Diabetes Brother   . Heart attack Brother   . Heart failure Brother   . Throat cancer Maternal Aunt        d. 21  . Colon cancer Neg Hx   . Liver cancer Neg Hx   . Pancreatic cancer Neg Hx   . Rectal cancer Neg Hx   . Stomach cancer Neg Hx     Prior to Admission medications   Medication Sig Start Date End Date Taking? Authorizing Provider  acetaminophen (TYLENOL) 500 MG tablet Take 500 mg by mouth every 6 (six)  hours as needed for moderate pain.   Yes [provider]  aspirin EC 81 MG tablet Take 81 mg by mouth daily.   Yes [provider]  FLUoxetine (PROZAC) 20 MG capsule Take 1 capsule (20 mg total) by mouth daily. 10/30/17  Yes Timmothy Euler, [place] D, PA-C  magnesium hydroxide (MILK OF MAGNESIA) 400 MG/5ML suspension Take 30 mLs by mouth daily as needed for mild constipation.   Yes [provider]  mirtazapine (REMERON) 15 MG tablet TAKE ONE TABLET BY MOUTH AT BEDTIME 01/20/18  Yes Gorsuch, Ni, MD  morphine (MSIR) 15 MG tablet Take 1 tablet (15 mg total) by mouth every 4 (four) hours as needed for severe pain. 12/30/17  Yes Gorsuch, Ni, MD  polyethylene glycol (MIRALAX / GLYCOLAX) packet Take 17 g by mouth daily as needed for mild constipation.  02/09/18  Yes [provider]  prochlorperazine (COMPAZINE) 10 MG tablet Take 1 tablet (10 mg total) by mouth every 6 (six) hours as needed for nausea or vomiting. 10/31/17  Yes Gorsuch, Ni, MD  sennosides-docusate sodium (SENOKOT-S) 8.6-50 MG tablet Take 2 tablets by mouth 2 (two) times daily.    Yes [provider]  dexamethasone (DECADRON) 4 MG tablet Take 5 tabs at the night before and 5 tab the morning of chemotherapy, every 3 weeks, by mouth Patient taking differently: Take 4-8 mg by mouth as directed. Take 2 tablets Daily for 2 Days then Take 1 tablet Daily for 3 Days after chemo 01/05/18   Heath Lark, MD  ibuprofen (ADVIL,MOTRIN) 600 MG tablet Take 600 mg by mouth every 6 (six) hours as needed for moderate pain.  02/09/18   [provider]  LORazepam (ATIVAN) 1 MG tablet Take 1 tablet (1 mg total) by mouth every 8 (eight) hours as needed for anxiety or sleep. Patient not taking: Reported on 03/06/2018 11/21/17   Heath Lark, MD  ondansetron (ZOFRAN) 8 MG tablet Take 1 tablet (8 mg total) by mouth every 8 (eight) hours as needed for nausea. Patient not taking: Reported on 03/06/2018 10/09/17   Heath Lark, MD     Physical Exam: Vitals:   03/06/18 2141 03/06/18 2200 03/06/18 2300 03/06/18 2330  BP: 119/64 133/71 126/67 122/80  Pulse: 98 87 96 90  Resp: 16  10 (!) 6  Temp:      TempSrc:      SpO2: 97% 98% 98% 96%    Constitutional: complaining of nausea, very thin and appears malnourished Head: Atraumatic Eyes: Conjunctiva clear ENM: dry mucous membranes. Normal dentition.  Neck: Supple Respiratory: Clear to auscultation bilaterally, no wheezing/rales/rhonchi. Normal respiratory effort. No accessory muscle use. . Cardiovascular: Regular rate and rhythm. No murmurs/rubs/gallops. Abdomen: mildly distended. Mild diffuse ttp. Healed midline surgical scar. No rebound. Musculoskeletal: No joint deformity upper and  lower extremities. Normal ROM, no contractures. Normal muscle tone.  Skin: No rashes, lesions, or ulcers.  Extremities: No peripheral edema. Palpable peripheral pulses. Neurologic: Alert, moving all 4 extremities. Psychiatric: Normal insight and judgement.   Labs on Admission: I have personally reviewed following labs and imaging studies  CBC: Recent Labs  Lab 03/06/18 2128  WBC 10.5  NEUTROABS 7.8*  HGB 13.4  HCT 40.1  MCV 101.5*  PLT 423   Basic Metabolic Panel: Recent Labs  Lab 03/06/18 2128  NA 138  K 3.5  CL 95*  CO2 32  GLUCOSE 160*  BUN 35*  CREATININE 0.85  CALCIUM 8.7*   GFR: CrCl cannot be calculated (Unknown ideal weight.). Liver Function Tests: Recent Labs  Lab 03/06/18 2128  AST 24  ALT 29  ALKPHOS 103  BILITOT 0.7  PROT 7.0  ALBUMIN 3.4*   Recent Labs  Lab 03/06/18 2128  LIPASE 23   No results for input(s): AMMONIA in the last 168 hours. Coagulation Profile: No results for input(s): INR, PROTIME in the last 168 hours. Cardiac Enzymes: No results for input(s): CKTOTAL, CKMB, CKMBINDEX, TROPONINI in the last 168 hours. BNP (last 3 results) No results for input(s): PROBNP in the last 8760 hours. HbA1C: No results for input(s):  HGBA1C in the last 72 hours. CBG: No results for input(s): GLUCAP in the last 168 hours. Lipid Profile: No results for input(s): CHOL, HDL, LDLCALC, TRIG, CHOLHDL, LDLDIRECT in the last 72 hours. Thyroid Function Tests: No results for input(s): TSH, T4TOTAL, FREET4, T3FREE, THYROIDAB in the last 72 hours. Anemia Panel: No results for input(s): VITAMINB12, FOLATE, FERRITIN, TIBC, IRON, RETICCTPCT in the last 72 hours. Urine analysis:    Component Value Date/Time   COLORURINE YELLOW 08/13/2017 1906   APPEARANCEUR CLEAR 08/13/2017 1906   LABSPEC 1.026 08/13/2017 1906   PHURINE 5.0 08/13/2017 1906   GLUCOSEU NEGATIVE 08/13/2017 1906   HGBUR MODERATE (A) 08/13/2017 1906   BILIRUBINUR NEGATIVE 08/13/2017 1906   BILIRUBINUR small (A) 12/03/2016 0917   KETONESUR 20 (A) 08/13/2017 1906   PROTEINUR NEGATIVE 08/13/2017 1906   UROBILINOGEN 0.2 12/03/2016 0917   UROBILINOGEN 0.2 08/06/2010 0246   NITRITE NEGATIVE 08/13/2017 1906   LEUKOCYTESUR NEGATIVE 08/13/2017 1906    Radiological Exams on Admission: Dg Abd 2 Views  Result Date: 03/06/2018 CLINICAL DATA:  Abdominal pain EXAM: ABDOMEN - 2 VIEW COMPARISON:  01/05/2018 FINDINGS: There is small bowel distention with multiple air-fluid levels. There is a small amount air in the colon. There is no evidence of pneumoperitoneum, portal venous gas or pneumatosis. There are no pathologic calcifications along the expected course of the ureters. The osseous structures are unremarkable. IMPRESSION: Small bowel distention with multiple air-fluid levels and air in the colon. Differential considerations include an ileus versus partial small bowel obstruction. Electronically Signed   By: Kathreen Devoid   On: 03/06/2018 22:36    EKG: pending  Assessment/Plan Principal Problem:   Partial bowel obstruction (HCC) Active Problems:   CAD (coronary artery disease), native coronary artery   Peritoneal carcinomatosis ([place])   SBO (small bowel obstruction)  ([place])   Adenocarcinoma carcinomatosis ([place])   # Partial bowel obstruction - vs ileus. Likely 2/2 known peritoneal carcinomatosis. Ddx includes postsurgical adhesion (most recent abdominal surgery was last month), folfox toxicity, constipation. No overt s/s acute peritonitis on exam and no signs pneumoperitoneum or pneumatosis on KUB. Is passing gas and does not have vomiting; kidney function is wnl. Refuses NG tube and at this point I do  not think it's necessary. Holding on CT or gen surg consult for now.   - IV fluids - NPO except for meds - haldol for nausea - tele and am EKG given multiple qtc prolongers - ct and/or surg consult should condition deteriorate - consider octreotide if no improvement  # Adenocarcinoma with peritoneal metastasis - recently transferred care to [facility], is on Folfox. Previously on hospice, not currently. DNR confirmed w/ pt. - morphine for pain - haldol for nausea, has also had success with zofran - cont home bowel regimen  # CAD - s/p pci/stent in 2018 - cont home asa  DVT prophylaxis: lovenox Code Status: dnr  Family Communication: niece shannon reece (hcpoa) [phone]  Disposition Plan: tbd  Consults called: none  Admission status: tele    Desma Maxim MD [facility] Pager [phone]  If 7PM-7AM, please contact night-coverage [email or web address] Password [facility]  03/06/2018, 11:57 PM

## 2018-03-07 ENCOUNTER — Inpatient Hospital Stay (HOSPITAL_COMMUNITY): Payer: Medicare Other

## 2018-03-07 ENCOUNTER — Other Ambulatory Visit: Payer: Self-pay

## 2018-03-07 DIAGNOSIS — K5669 Other partial intestinal obstruction: Secondary | ICD-10-CM

## 2018-03-07 DIAGNOSIS — I491 Atrial premature depolarization: Secondary | ICD-10-CM

## 2018-03-07 DIAGNOSIS — C800 Disseminated malignant neoplasm, unspecified: Secondary | ICD-10-CM

## 2018-03-07 DIAGNOSIS — E876 Hypokalemia: Secondary | ICD-10-CM

## 2018-03-07 DIAGNOSIS — Z95828 Presence of other vascular implants and grafts: Secondary | ICD-10-CM

## 2018-03-07 LAB — BASIC METABOLIC PANEL
Anion gap: 8 (ref 5–15)
BUN: 34 mg/dL — ABNORMAL HIGH (ref 8–23)
CHLORIDE: 98 mmol/L (ref 98–111)
CO2: 33 mmol/L — AB (ref 22–32)
Calcium: 8.5 mg/dL — ABNORMAL LOW (ref 8.9–10.3)
Creatinine, Ser: 0.9 mg/dL (ref 0.44–1.00)
GFR calc non Af Amer: >60 mL/min (ref >60)
GLUCOSE: 139 mg/dL — AB (ref 70–99)
Potassium: 3.6 mmol/L (ref 3.5–5.1)
Sodium: 139 mmol/L (ref 135–145)

## 2018-03-07 LAB — CBC
HCT: 36.7 % (ref 36.0–46.0)
Hemoglobin: 12.1 g/dL (ref 12.0–15.0)
MCH: 34.6 pg — AB (ref 26.0–34.0)
MCHC: 33 g/dL (ref 30.0–36.0)
MCV: 104.9 fL — ABNORMAL HIGH (ref 80.0–100.0)
PLATELETS: 203 10*3/uL (ref 150–400)
RBC: 3.5 MIL/uL — ABNORMAL LOW (ref 3.87–5.11)
RDW: 13.2 % (ref 11.5–15.5)
WBC: 8.7 10*3/uL (ref 4.0–10.5)
nRBC: 0 % (ref 0.0–0.2)

## 2018-03-07 LAB — CREATININE, SERUM
Creatinine, Ser: 0.86 mg/dL (ref 0.44–1.00)
GFR calc Af Amer: >60 mL/min (ref >60)

## 2018-03-07 LAB — HIV ANTIBODY (ROUTINE TESTING W REFLEX): HIV SCREEN 4TH GENERATION: NONREACTIVE

## 2018-03-07 LAB — LACTIC ACID, PLASMA: Lactic Acid, Venous: 0.9 mmol/L (ref 0.5–1.9)

## 2018-03-07 MED ORDER — SENNOSIDES-DOCUSATE SODIUM 8.6-50 MG PO TABS
2.0000 | ORAL_TABLET | Freq: Two times a day (BID) | ORAL | Status: DC
  Administered 2018-03-07 – 2018-03-08 (×3): 2 via ORAL
  Filled 2018-03-07 (×4): qty 2

## 2018-03-07 MED ORDER — ENOXAPARIN SODIUM 40 MG/0.4ML ~~LOC~~ SOLN
40.0000 mg | Freq: Every day | SUBCUTANEOUS | Status: DC
  Administered 2018-03-07 – 2018-03-08 (×2): 40 mg via SUBCUTANEOUS
  Filled 2018-03-07 (×2): qty 0.4

## 2018-03-07 MED ORDER — ASPIRIN EC 81 MG PO TBEC
81.0000 mg | DELAYED_RELEASE_TABLET | Freq: Every day | ORAL | Status: DC
  Administered 2018-03-07 – 2018-03-08 (×2): 81 mg via ORAL
  Filled 2018-03-07 (×2): qty 1

## 2018-03-07 MED ORDER — MIRTAZAPINE 15 MG PO TABS
15.0000 mg | ORAL_TABLET | Freq: Every day | ORAL | Status: DC
  Administered 2018-03-07 (×2): 15 mg via ORAL
  Filled 2018-03-07 (×2): qty 1

## 2018-03-07 MED ORDER — POLYETHYLENE GLYCOL 3350 17 G PO PACK: 17.0000 g | PACK | Freq: Every day | ORAL | Status: DC | PRN

## 2018-03-07 MED ORDER — SODIUM CHLORIDE 0.9% FLUSH: 10.0000 mL | INTRAVENOUS | Status: DC | PRN

## 2018-03-07 MED ORDER — MAGNESIUM HYDROXIDE 400 MG/5ML PO SUSP: 30.0000 mL | Freq: Every day | ORAL | Status: DC | PRN

## 2018-03-07 MED ORDER — HALOPERIDOL LACTATE 5 MG/ML IJ SOLN: 0.5000 mg | Freq: Four times a day (QID) | INTRAMUSCULAR | Status: DC | PRN

## 2018-03-07 MED ORDER — PROMETHAZINE HCL 25 MG/ML IJ SOLN
12.5000 mg | Freq: Four times a day (QID) | INTRAMUSCULAR | Status: DC | PRN
  Administered 2018-03-07 – 2018-03-08 (×4): 12.5 mg via INTRAVENOUS
  Filled 2018-03-07 (×4): qty 1

## 2018-03-07 MED ORDER — BISACODYL 10 MG RE SUPP
10.0000 mg | Freq: Every day | RECTAL | Status: AC
  Administered 2018-03-07 – 2018-03-08 (×2): 10 mg via RECTAL
  Filled 2018-03-07 (×2): qty 1

## 2018-03-07 MED ORDER — FLUOXETINE HCL 20 MG PO CAPS
20.0000 mg | ORAL_CAPSULE | Freq: Every day | ORAL | Status: DC
  Administered 2018-03-07 – 2018-03-08 (×2): 20 mg via ORAL
  Filled 2018-03-07 (×2): qty 1

## 2018-03-07 MED ORDER — LORAZEPAM 1 MG PO TABS: 1.0000 mg | ORAL_TABLET | Freq: Three times a day (TID) | ORAL | Status: DC | PRN

## 2018-03-07 MED ORDER — POTASSIUM CHLORIDE 10 MEQ/100ML IV SOLN
10.0000 meq | INTRAVENOUS | Status: AC
  Filled 2018-03-07: qty 100

## 2018-03-07 MED ORDER — ACETAMINOPHEN 500 MG PO TABS
500.0000 mg | ORAL_TABLET | Freq: Four times a day (QID) | ORAL | Status: DC | PRN
  Administered 2018-03-08: 250 mg via ORAL
  Filled 2018-03-07 (×2): qty 1

## 2018-03-07 MED ORDER — POTASSIUM CHLORIDE 10 MEQ/100ML IV SOLN
10.0000 meq | INTRAVENOUS | Status: AC
  Administered 2018-03-07 (×4): 10 meq via INTRAVENOUS
  Filled 2018-03-07 (×3): qty 100

## 2018-03-07 MED ORDER — KCL IN DEXTROSE-NACL 10-5-0.45 MEQ/L-%-% IV SOLN
INTRAVENOUS | Status: DC
  Administered 2018-03-07 (×3): via INTRAVENOUS
  Administered 2018-03-08: 125 mL/h via INTRAVENOUS
  Administered 2018-03-08: 11:00:00 via INTRAVENOUS
  Filled 2018-03-07 (×6): qty 1000

## 2018-03-07 MED ORDER — ONDANSETRON HCL 4 MG/2ML IJ SOLN
4.0000 mg | Freq: Four times a day (QID) | INTRAMUSCULAR | Status: DC | PRN
  Administered 2018-03-07 (×3): 4 mg via INTRAVENOUS
  Filled 2018-03-07 (×3): qty 2

## 2018-03-07 MED ORDER — MAGNESIUM SULFATE IN D5W 1-5 GM/100ML-% IV SOLN
1.0000 g | Freq: Once | INTRAVENOUS | Status: AC
  Administered 2018-03-07: 1 g via INTRAVENOUS
  Filled 2018-03-07: qty 100

## 2018-03-07 MED ORDER — MORPHINE SULFATE (PF) 4 MG/ML IV SOLN: 4.0000 mg | INTRAVENOUS | Status: DC | PRN

## 2018-03-07 NOTE — Progress Notes (Signed)
Port-a-cath placement checked via xray. May be accessed for use for IV fluids/meds and blood draws per MD. Eulas Post, RN

## 2018-03-07 NOTE — ED Notes (Signed)
ED TO INPATIENT HANDOFF REPORT  Name/Age/Gender Carol Ferguson 67 y.o. female  Code Status Code Status History    Date Active Date Inactive Code Status Order ID Comments User Context   08/16/2017 0943 08/21/2017 1805 DNR [id number]  Irean Hong, NP Inpatient   08/13/2017 2253 08/16/2017 0943 Full Code [id number]  Rise Patience, MD ED   07/12/2017 1003 07/14/2017 2015 Full Code [id number]  Elwin Mocha, MD Inpatient   09/26/2016 1147 09/28/2016 1438 Full Code [id number]  Belva Crome, MD Inpatient   09/26/2016 1147 09/26/2016 1147 Full Code [id number]  Leanor Kail, PA Inpatient    Questions for Most Recent Historical Code Status (Order [id number])    Question Answer Comment   In the event of cardiac or respiratory ARREST Do not call a "code blue"    In the event of cardiac or respiratory ARREST Do not perform Intubation, CPR, defibrillation or ACLS    In the event of cardiac or respiratory ARREST Use medication by any route, position, wound care, and other measures to relive pain and suffering. May use oxygen, suction and manual treatment of airway obstruction as needed for comfort.       Home/SNF/Other Home  Chief Complaint Abd. Pain  Level of Care/Admitting Diagnosis ED Disposition    ED Disposition Condition Comment   Admit  Hospital Area: [facility] [[id number]]  Level of Care: Telemetry [5]  Admit to tele based on following criteria: Monitor QTC interval  Diagnosis: Partial bowel obstruction [facility]) [[id number]]  Admitting Physician: Eston Esters  Attending Physician: Gwynne Edinger [[id number]]  Estimated length of stay: past midnight tomorrow  Certification:: I certify this patient will need inpatient services for at least 2 midnights  PT Class (Do Not Modify): Inpatient [101]  PT Acc Code (Do Not Modify): Private [1]       Medical History Past Medical History:  Diagnosis Date  . Anemia   . Anxiety   . Arthritis    DDD in neck  and lower back  . CAD (coronary artery disease)    a. Inf STEMI/LHC 09/2016 which showed occluded mid LCx treated with PCI, DES; remaining cath details included a proximal to mid LAD lesion of 20% stenosis, and proximal to mid RCA 25% stenosis. EF 55-60%.  . Cancer ([place])   . Clotting disorder ([place])   . Depression   . Diverticulosis   . Family history of melanoma   . Family history of throat cancer   . Gallstones   . Ischemic cardiomyopathy    a. 2D Echo 09/28/16 showed EF 55-60%, probable severe hypokinesis of the entire inferiormyocardium, normal diastolic parameters.  . Myocardial infarction ([place]) 09/26/2016  . Pneumonia   . Presence of tooth-root and mandibular implants   . Tobacco abuse   . Wears glasses     Allergies Allergies  Allergen Reactions  . Flagyl [Metronidazole] Nausea Only    IV Location/Drains/Wounds Patient Lines/Drains/Airways Status   Active Line/Drains/Airways    Name:   Placement date:   Placement time:   Site:   Days:   Peripheral IV 03/06/18 Left Antecubital   03/06/18    2047    Antecubital   1   PICC Single Lumen 00/92/33 PICC Right Basilic   00/76/22    6333    Basilic   545   Wound / Incision (Open or Dehisced) 07/11/17 Puncture Abdomen Left puncture site from biopsy   07/11/17    1328  Abdomen   239          Labs/Imaging Results for orders placed or performed during the hospital encounter of 03/06/18 (from the past 48 hour(s))  CBC with Differential     Status: Abnormal   Collection Time: 03/06/18  9:28 PM  Result Value Ref Range   WBC 10.5 4.0 - 10.5 K/uL   RBC 3.95 3.87 - 5.11 MIL/uL   Hemoglobin 13.4 12.0 - 15.0 g/dL   HCT 40.1 36.0 - 46.0 %   MCV 101.5 (H) 80.0 - 100.0 fL   MCH 33.9 26.0 - 34.0 pg   MCHC 33.4 30.0 - 36.0 g/dL   RDW 12.9 11.5 - 15.5 %   Platelets 234 150 - 400 K/uL   nRBC 0.0 0.0 - 0.2 %   Neutrophils Relative % 75 %   Neutro Abs 7.8 (H) 1.7 - 7.7 K/uL   Lymphocytes Relative 14 %   Lymphs Abs 1.5 0.7 - 4.0 K/uL    Monocytes Relative 11 %   Monocytes Absolute 1.1 (H) 0.1 - 1.0 K/uL   Eosinophils Relative 0 %   Eosinophils Absolute 0.0 0.0 - 0.5 K/uL   Basophils Relative 0 %   Basophils Absolute 0.0 0.0 - 0.1 K/uL   Immature Granulocytes 0 %   Abs Immature Granulocytes 0.04 0.00 - 0.07 K/uL    Comment: Performed at [facility], [place] [address] [place], [place] [address]  Comprehensive metabolic panel     Status: Abnormal   Collection Time: 03/06/18  9:28 PM  Result Value Ref Range   Sodium 138 135 - 145 mmol/L   Potassium 3.5 3.5 - 5.1 mmol/L   Chloride 95 (L) 98 - 111 mmol/L   CO2 32 22 - 32 mmol/L   Glucose, Bld 160 (H) 70 - 99 mg/dL   BUN 35 (H) 8 - 23 mg/dL   Creatinine, Ser 0.85 0.44 - 1.00 mg/dL   Calcium 8.7 (L) 8.9 - 10.3 mg/dL   Total Protein 7.0 6.5 - 8.1 g/dL   Albumin 3.4 (L) 3.5 - 5.0 g/dL   AST 24 15 - 41 U/L   ALT 29 0 - 44 U/L   Alkaline Phosphatase 103 38 - 126 U/L   Total Bilirubin 0.7 0.3 - 1.2 mg/dL   GFR calc non Af Amer >60 >60 mL/min   GFR calc Af Amer >60 >60 mL/min    Comment: (NOTE) The eGFR has been calculated using the CKD EPI equation. This calculation has not been validated in all clinical situations. eGFR's persistently <60 mL/min signify possible Chronic Kidney Disease.    Anion gap 11 5 - 15    Comment: Performed at [facility], [place] [address], [place], [place] [address]  Lipase, blood     Status: None   Collection Time: 03/06/18  9:28 PM  Result Value Ref Range   Lipase 23 11 - 51 U/L    Comment: Performed at [facility], [place] [address] [place], [place] [address]   Dg Abd 2 Views  Result Date: 03/06/2018 CLINICAL DATA:  Abdominal pain EXAM: ABDOMEN - 2 VIEW COMPARISON:  01/05/2018 FINDINGS: There is small bowel distention with multiple air-fluid levels. There is a small amount air in the colon. There is no evidence of pneumoperitoneum, portal venous gas or pneumatosis. There are no  pathologic calcifications along the expected course of the ureters. The osseous structures are unremarkable. IMPRESSION: Small bowel distention with multiple air-fluid levels and air in  the colon. Differential considerations include an ileus versus partial small bowel obstruction. Electronically Signed   By: Kathreen Devoid   On: 03/06/2018 22:36   None  Pending Labs Unresulted Labs (From admission, onward)    Start     Ordered   03/07/18 0000  Lactic acid, plasma  Add-on,   R     03/06/18 2359   03/06/18 2110  Urinalysis, Routine w reflex microscopic  (ED Abdominal Pain)  ONCE - STAT,   STAT     03/06/18 2109   Signed and Held  HIV antibody (Routine Testing)  Once,   R     Signed and Held   Signed and Held  CBC  (enoxaparin (LOVENOX)    CrCl >/= 30 ml/min)  Once,   R    Comments:  Baseline for enoxaparin therapy IF NOT ALREADY DRAWN.  Notify MD if PLT < 100 K.    Signed and Held   Signed and Held  Creatinine, serum  (enoxaparin (LOVENOX)    CrCl >/= 30 ml/min)  Once,   R    Comments:  Baseline for enoxaparin therapy IF NOT ALREADY DRAWN.    Signed and Held   Signed and Held  Creatinine, serum  (enoxaparin (LOVENOX)    CrCl >/= 30 ml/min)  Weekly,   R    Comments:  while on enoxaparin therapy    Signed and Held   Signed and Held  Basic metabolic panel  Tomorrow morning,   R     Signed and Held          Vitals/Pain Today's Vitals   03/06/18 2300 03/06/18 2330 03/06/18 2338 03/07/18 0000  BP: 126/67 122/80  (!) 130/93  Ferguson: 96 90  96  Resp: 10 (!) 6  (!) 23  Temp:      TempSrc:      SpO2: 98% 96%  97%  PainSc:   6      Isolation Precautions No active isolations  Medications Medications  sodium chloride 0.9 % bolus 1,000 mL (0 mLs Intravenous Stopped 03/07/18 0021)  ondansetron (ZOFRAN) injection 4 mg (4 mg Intravenous Given 03/06/18 2335)  morphine 4 MG/ML injection 4 mg (4 mg Intravenous Given 03/06/18 2335)    Mobility walks

## 2018-03-07 NOTE — Progress Notes (Addendum)
PROGRESS NOTE  Carol Ferguson JGG:[id number] DOB: 1951/03/06 DOA: 03/06/2018 PCP: [name], PA-C  HPI/Recap of past 24 hours:  No fever, she denies ab pain, she reports passing gas, no bm, still feeling nauseous, has frequent hiccups, no vomiting Reports zofran does not help, request something else for nausea She does has +bs No fever  Assessment/Plan: Principal Problem:   Partial bowel obstruction (HCC) Active Problems:   CAD (coronary artery disease), native coronary artery   Peritoneal carcinomatosis (HCC)   SBO (small bowel obstruction) ([place])   Adenocarcinoma carcinomatosis (HCC)  psbo vs ileus -she presented with ab pain x3 days,  she reports has been passing gas, but no bm for 4 days, she reports feeling nauseous  -kub on presentation "Small bowel distention with multiple air-fluid levels and air in the colon. Differential considerations include an ileus versus partial small bowel obstruction." -she declined NG placement ( reports h/o bowel obstruction , she did not like ng tube) -repeat kub with persistent sbo vs ileus -lactic acid 0.9, no fever -keep npo, continue hydration, antiemetics  -she continue to decline ng placement, case discussed with general surgery Dr Excell Seltzer who will see patient in consult.   Adenocarcinoma with peritoneal metastasis  Surgery on 10/15 at [facility] placement on 11/5 and first cycle of folfox on 11/5  Hypokalemia/hypomagnesemia Replace k/mag Recheck in am  New onset of afib? I have reviewed EKG and Tele  It appear to be sinus rhythm with pac's, ekg with poor baseline, will repeat ekg Recent echo done on 01/15/2018, lvef 50-55%  Right chest port terminates in the right atrium, 2 cm below the cavoatrial junction Case discussed with IR Dr Anselm Pancoast who states may need to pull back if cause symptomatic arrhythmia, currently patient is asymptomatic , will continue tele If frequent pac's or confirmed afib , will consult IR for port  cath reposition.   Macrocytic anemia -hgb 12, mcv 104 -will check b12/folate/tsh  CAD s/p stent in 2018 Diona Fanti /statin No chest pain She is followed by cardiology Dr Tamala Julian  Code Status: DNR  Family Communication: patient   Disposition Plan: not ready to discharge   Consultants:  Case discussed with general surgery Dr Excell Seltzer  Case discussed with IR Dr Anselm Pancoast  Procedures:  none  Antibiotics:  none   Objective: BP 128/76 (BP Location: Left Arm)   Pulse 79   Temp 98.1 F (36.7 C) (Oral)   Resp 16   SpO2 95%   Intake/Output Summary (Last 24 hours) at 03/07/2018 0816 Last data filed at 03/07/2018 0600 Gross per 24 hour  Intake 480.24 ml  Output -  Net 480.24 ml   There were no vitals filed for this visit.  Exam: Patient is examined daily including today on 03/07/2018, exams remain the same as of yesterday except that has changed    General:  Frail,  Thin, NAD  Cardiovascular: RRR with ectopic beats   Respiratory: CTABL  Abdomen: Soft/ND/NT, positive BS  Musculoskeletal: No Edema  Neuro: alert, oriented   Data Reviewed: Basic Metabolic Panel: Recent Labs  Lab 03/06/18 2128 03/07/18 0130  NA 138 139  K 3.5 3.6  CL 95* 98  CO2 32 33*  GLUCOSE 160* 139*  BUN 35* 34*  CREATININE 0.85 0.90  CALCIUM 8.7* 8.5*   Liver Function Tests: Recent Labs  Lab 03/06/18 2128  AST 24  ALT 29  ALKPHOS 103  BILITOT 0.7  PROT 7.0  ALBUMIN 3.4*   Recent Labs  Lab  03/06/18 2128  LIPASE 23   No results for input(s): AMMONIA in the last 168 hours. CBC: Recent Labs  Lab 03/06/18 2128 03/07/18 0130  WBC 10.5 8.7  NEUTROABS 7.8*  --   HGB 13.4 12.1  HCT 40.1 36.7  MCV 101.5* 104.9*  PLT 234 203   Cardiac Enzymes:   No results for input(s): CKTOTAL, CKMB, CKMBINDEX, TROPONINI in the last 168 hours. BNP (last 3 results) No results for input(s): BNP in the last 8760 hours.  ProBNP (last 3 results) No results for input(s): PROBNP in the last  8760 hours.  CBG: No results for input(s): GLUCAP in the last 168 hours.  No results found for this or any previous visit (from the past 240 hour(s)).   Studies: Dg Abd 2 Views  Result Date: 03/06/2018 CLINICAL DATA:  Abdominal pain EXAM: ABDOMEN - 2 VIEW COMPARISON:  01/05/2018 FINDINGS: There is small bowel distention with multiple air-fluid levels. There is a small amount air in the colon. There is no evidence of pneumoperitoneum, portal venous gas or pneumatosis. There are no pathologic calcifications along the expected course of the ureters. The osseous structures are unremarkable. IMPRESSION: Small bowel distention with multiple air-fluid levels and air in the colon. Differential considerations include an ileus versus partial small bowel obstruction. Electronically Signed   By: Kathreen Devoid   On: 03/06/2018 22:36    Scheduled Meds: . aspirin EC  81 mg Oral Daily  . enoxaparin (LOVENOX) injection  40 mg Subcutaneous Daily  . FLUoxetine  20 mg Oral Daily  . mirtazapine  15 mg Oral QHS  . senna-docusate  2 tablet Oral BID    Continuous Infusions: . dextrose 5 % and 0.45 % NaCl with KCl 10 mEq/L 125 mL/hr at 03/07/18 0600     Time spent: 79mins, case discussed with general surgery I have personally reviewed and interpreted on  03/07/2018 daily labs, tele strips, imagings as discussed above under date review session and assessment and plans.  I reviewed all nursing notes, pharmacy notes, consultant notes,  vitals, pertinent old records  I have discussed plan of care as described above with RN , patient  on 03/07/2018   Florencia Reasons MD, PhD  [facility] Pager [phone]. If 7PM-7AM, please contact night-coverage at [email or web address], password [facility] 03/07/2018, 8:16 AM  LOS: 1 day

## 2018-03-07 NOTE — Consult Note (Signed)
Reason for Consult: Bowel obstruction Referring Physician: Ilisha, Carol Ferguson is an 67 y.o. female.  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  She has been through several rounds of chemotherapy here and recently treated at [facility].  Has had several episodes of bowel obstruction in the past that resolved nonoperatively. On October 15 of this year she underwent laparoscopy followed by exploratory laparotomy at [facility] to obtain further tissue due to some continued doubt about the origin of the tumor.  She underwent omental biopsy.  No apparent extensive debulking or any mention of bowel obstruction at the time of the surgery.  Pathology has been most consistent with GI origin.  She underwent chemotherapy with FOLFOX on November 5.  She was feeling okay until this time but after her chemotherapy she developed progressive nausea and inability to tolerate oral intake.  Denies copious vomiting.  Also developed crampy mid abdominal pain.  Due to continued symptoms and weakness she presented to the emergency department.  She states she has had similar episodes after the FOLFOX chemotherapy in the past.  States she is passing some flatus but no bowel movement in about 4 days.  Past Medical History:  Diagnosis Date  . Anemia   . Anxiety   . 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 CAD (coronary artery disease)    a. Inf STEMI/LHC 09/2016 which showed occluded mid LCx treated with PCI, DES; remaining cath details included a proximal to mid LAD lesion of 20% stenosis, and proximal to mid RCA 25% stenosis. EF 55-60%.  . Cancer ([place])   . Clotting disorder ([place])   . Depression   . Diverticulosis   . Family history of melanoma   . Family history of throat cancer   . Gallstones   . Ischemic cardiomyopathy    a. 2D Echo 09/28/16 showed EF 55-60%, probable severe hypokinesis of the entire  inferiormyocardium, normal diastolic parameters.  . Myocardial infarction ([place]) 09/26/2016  . Pneumonia   . Presence of tooth-root and mandibular implants   . Tobacco abuse   . Wears glasses     Past Surgical History:  Procedure Laterality Date  . CERVICAL CONIZATION W/BX N/A 01/28/2017   Procedure: CONIZATION CERVIX WITH BIOPSY;  Surgeon: [name], MD;  Location: [facility];  Service: Gynecology;  Laterality: N/A;  . CHOLECYSTECTOMY    . CORONARY STENT INTERVENTION N/A 09/26/2016   Procedure: Coronary Stent Intervention;  Surgeon: Belva Crome, MD;  Location: [place] [facility];  Service: Cardiovascular;  Laterality: N/A;  . DILATION AND CURETTAGE OF UTERUS N/A 01/28/2017   Procedure: DILATATION AND CURETTAGE WITH ULTRASOUND GUIDENCE;  Surgeon: [name], MD;  Location: [place];  Service: Gynecology;  Laterality: N/A;  . LEFT HEART CATH AND CORONARY ANGIOGRAPHY N/A 09/26/2016   Procedure: Left Heart Cath and Coronary Angiography;  Surgeon: Belva Crome, MD;  Location: [place] [facility];  Service: Cardiovascular;  Laterality: N/A;    Family History  Problem Relation Age of Onset  . COPD Mother        d. 38  . Melanoma Brother        dx 68, d 34, stage IV  . Heart disease Father        d. 78s  . Stroke Father   . Heart disease Brother        d.  41  . Diabetes Brother   . Heart attack Brother   . Heart failure Brother   . Throat cancer Maternal Aunt        d. 27  . Colon cancer Neg Hx   . Liver cancer Neg Hx   . Pancreatic cancer Neg Hx   . Rectal cancer Neg Hx   . Stomach cancer Neg Hx     Social History:  reports that she quit smoking about 17 months ago. Her smoking use included cigarettes. She has a 35.00 pack-year smoking history. She has never used smokeless tobacco. She reports that she drank alcohol. She reports that she does not use drugs.  Allergies:  Allergies  Allergen Reactions  . Flagyl [Metronidazole] Nausea Only     Current Facility-Administered Medications  Medication Dose Route Frequency Provider Last Rate Last Dose  . acetaminophen (TYLENOL) tablet 500 mg  500 mg Oral Q6H PRN Wouk, Ailene Rud, MD      . aspirin EC tablet 81 mg  81 mg Oral Daily Gwynne Edinger, MD   81 mg at 03/07/18 1008  . bisacodyl (DULCOLAX) suppository 10 mg  10 mg Rectal Daily Florencia Reasons, MD      . dextrose 5 % and 0.45 % NaCl with KCl 10 mEq/L infusion   Intravenous Continuous Gwynne Edinger, MD 125 mL/hr at 03/07/18 1120    . enoxaparin (LOVENOX) injection 40 mg  40 mg Subcutaneous Daily Gwynne Edinger, MD   40 mg at 03/07/18 1008  . FLUoxetine (PROZAC) capsule 20 mg  20 mg Oral Daily Wouk, Ailene Rud, MD   20 mg at 03/07/18 1008  . haloperidol lactate (HALDOL) injection 0.5 mg  0.5 mg Intravenous Q6H PRN Wouk, Ailene Rud, MD      . LORazepam (ATIVAN) tablet 1 mg  1 mg Oral Q8H PRN Wouk, Ailene Rud, MD      . magnesium hydroxide (MILK OF MAGNESIA) suspension 30 mL  30 mL Oral Daily PRN Wouk, Ailene Rud, MD      . mirtazapine (REMERON) tablet 15 mg  15 mg Oral QHS Gwynne Edinger, MD   15 mg at 03/07/18 0135  . morphine 4 MG/ML injection 4 mg  4 mg Intravenous Q4H PRN Wouk, Ailene Rud, MD      . ondansetron [facility]) injection 4 mg  4 mg Intravenous Q6H PRN Florencia Reasons, MD   4 mg at 03/07/18 0831  . polyethylene glycol (MIRALAX / GLYCOLAX) packet 17 g  17 g Oral Daily PRN Wouk, Ailene Rud, MD      . potassium chloride 10 mEq in 100 mL IVPB  10 mEq Intravenous Q1 Hr x 4 Florencia Reasons, MD      . promethazine (PHENERGAN) injection 12.5 mg  12.5 mg Intravenous Q6H PRN Florencia Reasons, MD   12.5 mg at 03/07/18 1039  . senna-docusate (Senokot-S) tablet 2 tablet  2 tablet Oral BID Gwynne Edinger, MD   2 tablet at 03/07/18 1008     Results for orders placed or performed during the hospital encounter of 03/06/18 (from the past 48 hour(s))  CBC with Differential     Status: Abnormal   Collection Time: 03/06/18  9:28 PM   Result Value Ref Range   WBC 10.5 4.0 - 10.5 K/uL   RBC 3.95 3.87 - 5.11 MIL/uL   Hemoglobin 13.4 12.0 - 15.0 g/dL   HCT 40.1 36.0 - 46.0 %   MCV 101.5 (H) 80.0 - 100.0 fL  MCH 33.9 26.0 - 34.0 pg   MCHC 33.4 30.0 - 36.0 g/dL   RDW 12.9 11.5 - 15.5 %   Platelets 234 150 - 400 K/uL   nRBC 0.0 0.0 - 0.2 %   Neutrophils Relative % 75 %   Neutro Abs 7.8 (H) 1.7 - 7.7 K/uL   Lymphocytes Relative 14 %   Lymphs Abs 1.5 0.7 - 4.0 K/uL   Monocytes Relative 11 %   Monocytes Absolute 1.1 (H) 0.1 - 1.0 K/uL   Eosinophils Relative 0 %   Eosinophils Absolute 0.0 0.0 - 0.5 K/uL   Basophils Relative 0 %   Basophils Absolute 0.0 0.0 - 0.1 K/uL   Immature Granulocytes 0 %   Abs Immature Granulocytes 0.04 0.00 - 0.07 K/uL    Comment: Performed at [facility], [place] [address] [place], [place] [address]  Comprehensive metabolic panel     Status: Abnormal   Collection Time: 03/06/18  9:28 PM  Result Value Ref Range   Sodium 138 135 - 145 mmol/L   Potassium 3.5 3.5 - 5.1 mmol/L   Chloride 95 (L) 98 - 111 mmol/L   CO2 32 22 - 32 mmol/L   Glucose, Bld 160 (H) 70 - 99 mg/dL   BUN 35 (H) 8 - 23 mg/dL   Creatinine, Ser 0.85 0.44 - 1.00 mg/dL   Calcium 8.7 (L) 8.9 - 10.3 mg/dL   Total Protein 7.0 6.5 - 8.1 g/dL   Albumin 3.4 (L) 3.5 - 5.0 g/dL   AST 24 15 - 41 U/L   ALT 29 0 - 44 U/L   Alkaline Phosphatase 103 38 - 126 U/L   Total Bilirubin 0.7 0.3 - 1.2 mg/dL   GFR calc non Af Amer >60 >60 mL/min   GFR calc Af Amer >60 >60 mL/min    Comment: (NOTE) The eGFR has been calculated using the CKD EPI equation. This calculation has not been validated in all clinical situations. eGFR's persistently <60 mL/min signify possible Chronic Kidney Disease.    Anion gap 11 5 - 15    Comment: Performed at [facility], [place] [address] [place], [place] [address]  Lipase, blood     Status: None   Collection Time: 03/06/18  9:28 PM  Result Value Ref Range   Lipase 23  11 - 51 U/L    Comment: Performed at [facility], [place] [address], [place], [place] [address]  Lactic acid, plasma     Status: None   Collection Time: 03/07/18  1:30 AM  Result Value Ref Range   Lactic Acid, Venous 0.9 0.5 - 1.9 mmol/L    Comment: Performed at [facility], [place] [address] [place], [place] [address]  CBC     Status: Abnormal   Collection Time: 03/07/18  1:30 AM  Result Value Ref Range   WBC 8.7 4.0 - 10.5 K/uL   RBC 3.50 (L) 3.87 - 5.11 MIL/uL   Hemoglobin 12.1 12.0 - 15.0 g/dL   HCT 36.7 36.0 - 46.0 %   MCV 104.9 (H) 80.0 - 100.0 fL   MCH 34.6 (H) 26.0 - 34.0 pg   MCHC 33.0 30.0 - 36.0 g/dL   RDW 13.2 11.5 - 15.5 %   Platelets 203 150 - 400 K/uL   nRBC 0.0 0.0 - 0.2 %    Comment: Performed at [facility], [place] [address] [place], [place] [address]  Basic metabolic panel  Status: Abnormal   Collection Time: 03/07/18  1:30 AM  Result Value Ref Range   Sodium 139 135 - 145 mmol/L   Potassium 3.6 3.5 - 5.1 mmol/L   Chloride 98 98 - 111 mmol/L   CO2 33 (H) 22 - 32 mmol/L   Glucose, Bld 139 (H) 70 - 99 mg/dL   BUN 34 (H) 8 - 23 mg/dL   Creatinine, Ser 0.90 0.44 - 1.00 mg/dL   Calcium 8.5 (L) 8.9 - 10.3 mg/dL   GFR calc non Af Amer >60 >60 mL/min   GFR calc Af Amer >60 >60 mL/min    Comment: (NOTE) The eGFR has been calculated using the CKD EPI equation. This calculation has not been validated in all clinical situations. eGFR's persistently <60 mL/min signify possible Chronic Kidney Disease.    Anion gap 8 5 - 15    Comment: Performed at [facility], [place] [address], [place], [place] [address]    Dg Abd 1 View  Result Date: 03/07/2018 CLINICAL DATA:  Small bowel obstruction EXAM: ABDOMEN - 1 VIEW COMPARISON:  the previous day's study FINDINGS: Persistent dilatation of small bowel centered in the left mid abdomen. Degree of dilatation appears perhaps minimally improved. Number of  involved loops appears stable. The colon remains decompressed. Stomach is nondistended. Cholecystectomy clips. Lower thoracic and lumbar spondylitic changes. IMPRESSION: Minimal if any improvement in ileus versus small bowel obstruction since previous day's exam Electronically Signed   By: Lucrezia Europe M.D.   On: 03/07/2018 09:16   Dg Chest Port 1 View  Result Date: 03/07/2018 CLINICAL DATA:  Chest port placement EXAM: PORTABLE CHEST 1 VIEW COMPARISON:  CT chest dated 12/12/2017 FINDINGS: Lungs are clear.  No pleural effusion or pneumothorax. The heart is normal in size. Right chest port terminates in the right atrium, 2 cm below the cavoatrial junction. IMPRESSION: Right chest port terminates in the right atrium, 2 cm below the cavoatrial junction. Electronically Signed   By: Julian Hy M.D.   On: 03/07/2018 12:37   Dg Abd 2 Views  Result Date: 03/06/2018 CLINICAL DATA:  Abdominal pain EXAM: ABDOMEN - 2 VIEW COMPARISON:  01/05/2018 FINDINGS: There is small bowel distention with multiple air-fluid levels. There is a small amount air in the colon. There is no evidence of pneumoperitoneum, portal venous gas or pneumatosis. There are no pathologic calcifications along the expected course of the ureters. The osseous structures are unremarkable. IMPRESSION: Small bowel distention with multiple air-fluid levels and air in the colon. Differential considerations include an ileus versus partial small bowel obstruction. Electronically Signed   By: Kathreen Devoid   On: 03/06/2018 22:36    Review of Systems  Constitutional: Positive for weight loss. Negative for chills and fever.  Respiratory: Negative.   Cardiovascular: Negative.   Gastrointestinal: Positive for abdominal pain, constipation and nausea. Negative for blood in stool, diarrhea and vomiting.   Blood pressure 128/76, pulse 79, temperature 98.1 F (36.7 C), temperature source Oral, resp. rate 16, SpO2 95 %. Physical Exam General: Alert,  pleasant somewhat cachectic chronically ill-appearing Caucasian female in no distress Skin: Warm and dry without rash or infection. HEENT: No palpable masses or thyromegaly. Sclera nonicteric. Pupils equal round and reactive.  Lymph nodes: No cervical, supraclavicular, or inguinal nodes palpable. Lungs: Breath sounds clear and equal without increased work of breathing Cardiovascular: Regular rate and rhythm without murmur.  Abdomen: Small midline incision unremarkable without evidence of infection.  Nondistended.  Moderately distended and generally firm.  Mild diffuse tenderness without guarding.  Hyperactive bowel sounds.. No masses palpable. . No palpable hernias. Extremities: No edema or joint swelling or deformity. No chronic venous stasis changes. Neurologic: Alert and fully oriented.  Gross motor deficits.  . Assessment/Plan: 67 year old patient with over 1 year history of abdominal carcinomatosis likely of GI origin specific site unknown.  History of previous bowel obstructions.  Multiple rounds of chemotherapy.  Most recently exploratory laparotomy and omental biopsy at [facility] February 03, 2018.  Now presents with nausea, crampy abdominal pain, inability to tolerate oral intake and obstipation.  Plain x-rays consistent with bowel obstruction with dilated small bowel and relatively decompressed colon. Any 1 of or a combination of multiple factors likely including tumor obstruction, inflammatory adhesions from recent surgery, combined with ileus from recent narcotics and chemotherapy. Patient currently declines NG tube and without frequent vomiting would not push for this.  I do not believe she is a surgical candidate particularly at present only 4 weeks after laparotomy.  She is also cachectic and would be extremely high risk for surgery otherwise.  Would continue conservative management with bowel rest and follow-up x-rays for now.  If does not improve over a couple of days would consider CT scan  to see if there is an identifiable point of obstruction.  Carol Ferguson 03/07/2018, 1:59 PM

## 2018-03-07 NOTE — Progress Notes (Signed)
Pt's morning EKG showed new onset Afib. Although no history of Afib was noted in chart, pt does state that she was diagnosed with an irregular heart beat "years ago." Pt heart rate stable in 80's. Pt alert and has no complaints. Provider notified. Will continue to monitor.

## 2018-03-07 NOTE — ED Notes (Signed)
Report given to Anna RN.

## 2018-03-08 DIAGNOSIS — L8990 Pressure ulcer of unspecified site, unspecified stage: Secondary | ICD-10-CM

## 2018-03-08 LAB — VITAMIN B12: Vitamin B-12: 283 pg/mL (ref 180–914)

## 2018-03-08 LAB — MAGNESIUM: Magnesium: 2.1 mg/dL (ref 1.7–2.4)

## 2018-03-08 LAB — BASIC METABOLIC PANEL
ANION GAP: 6 (ref 5–15)
BUN: 18 mg/dL (ref 8–23)
CO2: 28 mmol/L (ref 22–32)
CREATININE: 0.69 mg/dL (ref 0.44–1.00)
Calcium: 8.3 mg/dL — ABNORMAL LOW (ref 8.9–10.3)
Chloride: 101 mmol/L (ref 98–111)
GFR calc non Af Amer: >60 mL/min (ref >60)
Glucose, Bld: 130 mg/dL — ABNORMAL HIGH (ref 70–99)
POTASSIUM: 3.8 mmol/L (ref 3.5–5.1)
SODIUM: 135 mmol/L (ref 135–145)

## 2018-03-08 LAB — TSH: TSH: 0.696 u[IU]/mL (ref 0.350–4.500)

## 2018-03-08 LAB — FOLATE: Folate: 13.2 ng/mL (ref >5.9)

## 2018-03-08 MED ORDER — HALOPERIDOL LACTATE 5 MG/ML IJ SOLN: 0.5000 mg | Freq: Four times a day (QID) | INTRAMUSCULAR | Status: DC | PRN

## 2018-03-08 MED ORDER — PROMETHAZINE HCL 25 MG/ML IJ SOLN: 12.5000 mg | Freq: Four times a day (QID) | INTRAMUSCULAR | Status: DC | PRN

## 2018-03-08 MED ORDER — BOOST / RESOURCE BREEZE PO LIQD CUSTOM
1.0000 | Freq: Three times a day (TID) | ORAL | Status: DC
  Administered 2018-03-08: 1 via ORAL

## 2018-03-08 MED ORDER — VITAMIN B-12 1000 MCG PO TABS
1000.0000 ug | ORAL_TABLET | Freq: Every day | ORAL | Status: DC
  Administered 2018-03-08: 1000 ug via ORAL
  Filled 2018-03-08: qty 1

## 2018-03-08 MED ORDER — POLYETHYLENE GLYCOL 3350 17 G PO PACK: 17.0000 g | PACK | Freq: Every day | ORAL | Status: DC | PRN

## 2018-03-08 MED ORDER — CYANOCOBALAMIN 1000 MCG PO TABS: 1000.0000 ug | ORAL_TABLET | Freq: Every day | ORAL | 0 refills | Status: AC

## 2018-03-08 MED ORDER — POTASSIUM CHLORIDE 10 MEQ/100ML IV SOLN
10.0000 meq | INTRAVENOUS | Status: AC
  Administered 2018-03-08 (×2): 10 meq via INTRAVENOUS
  Filled 2018-03-08 (×2): qty 100

## 2018-03-08 MED ORDER — ONDANSETRON HCL 4 MG/2ML IJ SOLN: 4.0000 mg | Freq: Four times a day (QID) | INTRAMUSCULAR | Status: DC | PRN

## 2018-03-08 MED ORDER — HEPARIN SOD (PORK) LOCK FLUSH 100 UNIT/ML IV SOLN
500.0000 [IU] | INTRAVENOUS | Status: AC | PRN
  Administered 2018-03-08: 500 [IU]

## 2018-03-08 NOTE — Discharge Summary (Signed)
Discharge Summary  Carol Ferguson LZJ:[id number] DOB: [birth year]  PCP: Tenna Delaine D, PA-C  Admit date: 03/06/2018 Discharge date: 03/08/2018  Time spent: 29mins.  Recommendations for Outpatient Follow-up:  1. F/u with PCP within a week  for hospital discharge follow up, repeat cbc/bmp at follow up. 2. F/u with oncology at [facility] for Right chest port terminates in the right atrium, 2 cm below the cavoatrial junction. She currently does not appear to be symptomatic   Discharge Diagnoses:  Active Hospital Problems   Diagnosis Date Noted  . Partial bowel obstruction ([place]) 03/06/2018  . Pressure injury of skin 03/08/2018  . 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 SBO (small bowel obstruction) ([place]) 07/12/2017  . 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 CAD (coronary artery disease), native coronary artery 11/27/2016    Resolved Hospital Problems  No resolved problems to display.    Discharge Condition: stable  Diet recommendation: regular diet  Filed Weights   03/07/18 1400  Weight: 48.7 kg    History of present illness: (per admitting MD Dr Si Raider) PCP: [name], PA-C  Patient coming from: home   Chief Complaint: nausea and abdominal pain  HPI: Carol Ferguson is a 67 y.o. 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  Has had several similar episodes in the past.  Reports has had several days of intermittent crampy abdominal pain that most localizes to the periumbilical region. Also associated nausea and decreased appetite. No vomiting. Chronic constipation, last stool 3 days ago. Has been passing flatus. Abdominal pain comes and goes. No chest pain, no worsening dyspna. No dysuria.  Recently transferred oncology care to [facility]. Last month had laparoscopic surgery for biopsy to help  gain more clarity on tumor origin. Now thought to likely be GI primary. Is currently undergoing Folfox chemotherapy.  ED Course: cxr, fluids, anti-emetics, labs  Hospital Course:  Principal Problem:   Partial bowel obstruction (HCC) Active Problems:   CAD (coronary artery disease), native coronary artery   Peritoneal carcinomatosis (HCC)   SBO (small bowel obstruction) ([place])   Adenocarcinoma carcinomatosis (HCC)   Pressure injury of skin   Psbo vs ileus -she presented with ab pain x3 days,  she reports has been passing gas, but no bm for 4 days, she reports feeling nauseous  -kub on presentation "Small bowel distention with multiple air-fluid levels and air in the colon. Differential considerations include an ileus versus partial small bowel obstruction." -lactic acid 0.9, no fever, she declined NG placement ( reports h/o bowel obstruction , she did not like ng tube) -repeat kub with persistent sbo vs ileus on day two of hospitalization, general surgery consulted,  -fortunately , she improved with conservation management with bowel rest, ivf -she tolerated diet , she wants to go home  She reports this is her third bowel blockage , she state " I get bowel blockage everytime I got that chemo (folfox) She is advised to follow up with her oncology to discuss this. She reports has an appointment with [facility] oncology on Thursday.  Adenocarcinoma with peritoneal metastasis  Surgery on 10/15 at [facility] placement on 11/5 and first cycle of folfox on 11/5 F/u with [facility] oncology  Hypokalemia/hypomagnesemia Replaced and normalized   Right chest port terminates in the right atrium, 2 cm below the cavoatrial junction -Case discussed with IR Dr Anselm Pancoast who states may need to pull  back if cause symptomatic arrhythmia, currently patient is asymptomatic   -sinus rhythm with pac's on tele and EKG, no afib -Recent echo done on 01/15/2018, lvef 50-55% -patient is to follow up with [facility] oncology  regarding port reposition if needed  Macrocytic anemia -hgb 12, mcv 104 -b12 low normal, start supplement -folate/tsh unremarkable  CAD s/p stent in 2018 Diona Fanti /statin No chest pain She is followed by cardiology Dr Tamala Julian  Severe malnutrition in context of chronic illness, Underweight nutrition input appreciated  Stage I sacral pressure ulcer presented on admission, with mild erythema, no skin breakdown Encourage pressure off loading measures  Code Status: DNR  Family Communication: patient and niece at bedside  Disposition Plan: home    Consultants:  Case discussed with general surgery Dr Excell Seltzer  Case discussed with IR Dr Anselm Pancoast  Procedures:  none  Antibiotics:  none   Discharge Exam: BP 121/70 (BP Location: Left Arm)   Pulse 76   Temp 98.3 F (36.8 C) (Oral)   Resp 16   Ht 5\' 5"  (1.651 m)   Wt 48.7 kg   SpO2 98%   BMI 17.87 kg/m    General:  Frail,  Thin, NAD  Cardiovascular: RRR with ectopic beats   Respiratory: CTABL  Abdomen: Soft/ND/NT, positive BS  Musculoskeletal: No Edema  Neuro: alert, oriented   Discharge Instructions You were cared for by a hospitalist during your hospital stay. If you have any questions about your discharge medications or the care you received while you were in the hospital after you are discharged, you can call the unit and asked to speak with the hospitalist on call if the hospitalist that took care of you is not available. Once you are discharged, your primary care physician will handle any further medical issues. Please note that NO REFILLS for any discharge medications will be authorized once you are discharged, as it is imperative that you return to your primary care physician (or establish a relationship with a primary care physician if you do not have one) for your aftercare needs so that they can reassess your need for medications and monitor your lab values.  Discharge Instructions    Diet general    Complete by:  As directed    Advance diet as  Tolerated   Increase activity slowly   Complete by:  As directed      Allergies as of 03/08/2018      Reactions   Flagyl [metronidazole] Nausea Only      Medication List    STOP taking these medications   LORazepam 1 MG tablet Commonly known as:  ATIVAN     TAKE these medications   acetaminophen 500 MG tablet Commonly known as:  TYLENOL Take 500 mg by mouth every 6 (six) hours as needed for moderate pain.   aspirin EC 81 MG tablet Take 81 mg by mouth daily.   cyanocobalamin 1000 MCG tablet Take 1 tablet (1,000 mcg total) by mouth daily.   dexamethasone 4 MG tablet Commonly known as:  DECADRON Take 5 tabs at the night before and 5 tab the morning of chemotherapy, every 3 weeks, by mouth What changed:    how much to take  how to take this  when to take this  additional instructions   FLUoxetine 20 MG capsule Commonly known as:  PROZAC Take 1 capsule (20 mg total) by mouth daily.   ibuprofen 600 MG tablet Commonly known as:  ADVIL,MOTRIN Take 600 mg by mouth every 6 (six)  hours as needed for moderate pain.   magnesium hydroxide 400 MG/5ML suspension Commonly known as:  MILK OF MAGNESIA Take 30 mLs by mouth daily as needed for mild constipation.   mirtazapine 15 MG tablet Commonly known as:  REMERON TAKE ONE TABLET BY MOUTH AT BEDTIME   morphine 15 MG tablet Commonly known as:  MSIR Take 1 tablet (15 mg total) by mouth every 4 (four) hours as needed for severe pain.   ondansetron 8 MG tablet Commonly known as:  ZOFRAN Take 1 tablet (8 mg total) by mouth every 8 (eight) hours as needed for nausea.   polyethylene glycol packet Commonly known as:  MIRALAX / GLYCOLAX Take 17 g by mouth daily as needed for mild constipation.   prochlorperazine 10 MG tablet Commonly known as:  COMPAZINE Take 1 tablet (10 mg total) by mouth every 6 (six) hours as needed for nausea or vomiting.   sennosides-docusate sodium  8.6-50 MG tablet Commonly known as:  SENOKOT-S Take 2 tablets by mouth 2 (two) times daily.      Allergies  Allergen Reactions  . Flagyl [Metronidazole] Nausea Only   Follow-up Information    [name], PA-C Follow up in 1 week(s).   Specialties:  Physician Assistant, Family Medicine Why:  hospital discharge follow up, repeat cbc/bmp at follow up. Contact information: [place] [address] [phone]        [facility] with [facility].   Why:  for Right chest port terminates in the right atrium, 2 cm below the cavoatrial junction.       Belva Crome, MD Follow up.   Specialty:  Cardiology Why:  cad s/p stent, Contact information: [address] [place] [address] [phone]            The results of significant diagnostics from this hospitalization (including imaging, microbiology, ancillary and laboratory) are listed below for reference.    Significant Diagnostic Studies: Dg Abd 1 View  Result Date: 03/07/2018 CLINICAL DATA:  Small bowel obstruction EXAM: ABDOMEN - 1 VIEW COMPARISON:  the previous day's study FINDINGS: Persistent dilatation of small bowel centered in the left mid abdomen. Degree of dilatation appears perhaps minimally improved. Number of involved loops appears stable. The colon remains decompressed. Stomach is nondistended. Cholecystectomy clips. Lower thoracic and lumbar spondylitic changes. IMPRESSION: Minimal if any improvement in ileus versus small bowel obstruction since previous day's exam Electronically Signed   By: Lucrezia Europe M.D.   On: 03/07/2018 09:16   Dg Chest Port 1 View  Result Date: 03/07/2018 CLINICAL DATA:  Chest port placement EXAM: PORTABLE CHEST 1 VIEW COMPARISON:  CT chest dated 12/12/2017 FINDINGS: Lungs are clear.  No pleural effusion or pneumothorax. The heart is normal in size. Right chest port terminates in the right atrium, 2 cm below the cavoatrial junction. IMPRESSION:  Right chest port terminates in the right atrium, 2 cm below the cavoatrial junction. Electronically Signed   By: Julian Hy M.D.   On: 03/07/2018 12:37   Dg Abd 2 Views  Result Date: 03/06/2018 CLINICAL DATA:  Abdominal pain EXAM: ABDOMEN - 2 VIEW COMPARISON:  01/05/2018 FINDINGS: There is small bowel distention with multiple air-fluid levels. There is a small amount air in the colon. There is no evidence of pneumoperitoneum, portal venous gas or pneumatosis. There are no pathologic calcifications along the expected course of the ureters. The osseous structures are unremarkable. IMPRESSION: Small bowel distention with multiple air-fluid levels and  air in the colon. Differential considerations include an ileus versus partial small bowel obstruction. Electronically Signed   By: Kathreen Devoid   On: 03/06/2018 22:36    Microbiology: No results found for this or any previous visit (from the past 240 hour(s)).   Labs: Basic Metabolic Panel: Recent Labs  Lab 03/06/18 2128 03/07/18 0130 03/08/18 0349  NA 138 139 135  K 3.5 3.6 3.8  CL 95* 98 101  CO2 32 33* 28  GLUCOSE 160* 139* 130*  BUN 35* 34* 18  CREATININE 0.85 0.86  0.90 0.69  CALCIUM 8.7* 8.5* 8.3*  MG  --   --  2.1   Liver Function Tests: Recent Labs  Lab 03/06/18 2128  AST 24  ALT 29  ALKPHOS 103  BILITOT 0.7  PROT 7.0  ALBUMIN 3.4*   Recent Labs  Lab 03/06/18 2128  LIPASE 23   No results for input(s): AMMONIA in the last 168 hours. CBC: Recent Labs  Lab 03/06/18 2128 03/07/18 0130  WBC 10.5 8.7  NEUTROABS 7.8*  --   HGB 13.4 12.1  HCT 40.1 36.7  MCV 101.5* 104.9*  PLT 234 203   Cardiac Enzymes: No results for input(s): CKTOTAL, CKMB, CKMBINDEX, TROPONINI in the last 168 hours. BNP: BNP (last 3 results) No results for input(s): BNP in the last 8760 hours.  ProBNP (last 3 results) No results for input(s): PROBNP in the last 8760 hours.  CBG: No results for input(s): GLUCAP in the last 168  hours.     Signed:  Florencia Reasons MD, PhD  [facility] 03/08/2018, 12:38 PM

## 2018-03-08 NOTE — Progress Notes (Signed)
Initial Nutrition Assessment  DOCUMENTATION CODES:   Severe malnutrition in context of chronic illness, Underweight  INTERVENTION:   Provide Boost Breeze po TID, each supplement provides 250 kcal and 9 grams of protein  NUTRITION DIAGNOSIS:   Severe Malnutrition related to chronic illness, cancer and cancer related treatments as evidenced by moderate fat depletion, severe muscle depletion, percent weight loss.  GOAL:   Patient will meet greater than or equal to 90% of their needs  MONITOR:   PO intake, Diet advancement, Supplement acceptance, Labs, Weight trends, I & O's, Skin  REASON FOR ASSESSMENT:   Malnutrition Screening Tool    ASSESSMENT:   67 y.o. 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 oncology on folfox  Patient currently having BMs with some appetite. Only on clear liquids, will add Boost Breeze for additional calories and protein. Pt receiving chemotherapy for GI cancer, last 11/5. Pt developed N/V and abdominal pain after treatment.   Per weight records, pt has lost 28 lb since December 2018 (21% wt loss x 11 months, significant for time frame).   Labs reviewed. Medications: Remeron tablet daily, Senokot-S tablet BID, D5 and .45% NaCl w/ KCl infusion at 125 ml/hr, IV Phenergan PRN  NUTRITION - FOCUSED PHYSICAL EXAM:    Most Recent Value  Orbital Region  Mild depletion  Upper Arm Region  Moderate depletion  Thoracic and Lumbar Region  Unable to assess  Buccal Region  Mild depletion  Temple Region  Moderate depletion  Clavicle Bone Region  Severe depletion  Clavicle and Acromion Bone Region  Severe depletion  Scapular Bone Region  Severe depletion  Dorsal Hand  Mild depletion  Patellar Region  Unable to assess  Anterior Thigh Region  Unable to assess  Posterior Calf Region  Unable to assess  Edema (RD Assessment)  None       Diet  Order:   Diet Order            Diet clear liquid Room service appropriate? Yes; Fluid consistency: Thin  Diet effective now              EDUCATION NEEDS:   No education needs have been identified at this time  Skin:  Skin Assessment: Skin Integrity Issues: Skin Integrity Issues:: Stage I Stage I: sacral  Last BM:  11/17  Height:   Ht Readings from Last 1 Encounters:  03/07/18 5\' 5"  (1.651 m)    Weight:   Wt Readings from Last 1 Encounters:  03/07/18 48.7 kg    Ideal Body Weight:  56.8 kg  BMI:  Body mass index is 17.87 kg/m.  Estimated Nutritional Needs:   Kcal:  1600-1800  Protein:  85-95g  Fluid:  1.8L/day   Clayton Bibles, MS, RD, LDN [place] Dietitian Pager: [phone] After Hours Pager: [phone]

## 2018-03-08 NOTE — Progress Notes (Signed)
Patient ID: Carol Ferguson, female   DOB: 1950/10/27, 67 y.o.   MRN: [id number]     Subjective: Happily, she feels better today.  Had a good bowel movement yesterday.  She woke up with some nausea this morning but this is gone.  She feels hungry.  Denies abdominal pain.  Objective: Vital signs in last 24 hours: Temp:  [98.3 F (36.8 C)-99.3 F (37.4 C)] 98.3 F (36.8 C) (11/17 0535) Pulse Rate:  [76-85] 76 (11/17 0535) Resp:  [16] 16 (11/17 0535) BP: (111-121)/(63-70) 121/70 (11/17 0535) SpO2:  [98 %-99 %] 98 % (11/17 0535) Weight:  [48.7 kg] 48.7 kg (11/16 1400) Last BM Date: 03/07/18  Intake/Output from previous day: 11/16 0701 - 11/17 0700 In: 3071 [P.O.:40; I.V.:2831; IV Piggyback:200] Out: 200 [Urine:200] Intake/Output this shift: No intake/output data recorded.  General appearance: alert, cooperative and no distress GI: Less distention today.  Somewhat firm.  Nontender. Incision/Wound: No erythema or drainage  Lab Results:  Recent Labs    03/06/18 2128 03/07/18 0130  WBC 10.5 8.7  HGB 13.4 12.1  HCT 40.1 36.7  PLT 234 203   BMET Recent Labs    03/07/18 0130 03/08/18 0349  NA 139 135  K 3.6 3.8  CL 98 101  CO2 33* 28  GLUCOSE 139* 130*  BUN 34* 18  CREATININE 0.86  0.90 0.69  CALCIUM 8.5* 8.3*     Studies/Results: Dg Abd 1 View  Result Date: 03/07/2018 CLINICAL DATA:  Small bowel obstruction EXAM: ABDOMEN - 1 VIEW COMPARISON:  the previous day's study FINDINGS: Persistent dilatation of small bowel centered in the left mid abdomen. Degree of dilatation appears perhaps minimally improved. Number of involved loops appears stable. The colon remains decompressed. Stomach is nondistended. Cholecystectomy clips. Lower thoracic and lumbar spondylitic changes. IMPRESSION: Minimal if any improvement in ileus versus small bowel obstruction since previous day's exam Electronically Signed   By: Lucrezia Europe M.D.   On: 03/07/2018 09:16   Dg Chest Port 1  View  Result Date: 03/07/2018 CLINICAL DATA:  Chest port placement EXAM: PORTABLE CHEST 1 VIEW COMPARISON:  CT chest dated 12/12/2017 FINDINGS: Lungs are clear.  No pleural effusion or pneumothorax. The heart is normal in size. Right chest port terminates in the right atrium, 2 cm below the cavoatrial junction. IMPRESSION: Right chest port terminates in the right atrium, 2 cm below the cavoatrial junction. Electronically Signed   By: Julian Hy M.D.   On: 03/07/2018 12:37   Dg Abd 2 Views  Result Date: 03/06/2018 CLINICAL DATA:  Abdominal pain EXAM: ABDOMEN - 2 VIEW COMPARISON:  01/05/2018 FINDINGS: There is small bowel distention with multiple air-fluid levels. There is a small amount air in the colon. There is no evidence of pneumoperitoneum, portal venous gas or pneumatosis. There are no pathologic calcifications along the expected course of the ureters. The osseous structures are unremarkable. IMPRESSION: Small bowel distention with multiple air-fluid levels and air in the colon. Differential considerations include an ileus versus partial small bowel obstruction. Electronically Signed   By: Kathreen Devoid   On: 03/06/2018 22:36    Anti-infectives: Anti-infectives (From admission, onward)   None      Assessment/Plan: Unfortunate patient with carcinomatosis from unknown GI primary status post recent laparotomy and biopsy at [facility]  Recent FOLFOX chemotherapy.  History of recurrent bowel obstruction admitted with similar picture. Clinically much better today.  I will order clear liquid diet.  She is anxious to go home.  LOS: 2 days    Edward Jolly 03/08/2018

## 2018-03-12 DIAGNOSIS — R634 Abnormal weight loss: Secondary | ICD-10-CM | POA: Diagnosis not present

## 2018-03-12 DIAGNOSIS — R1909 Other intra-abdominal and pelvic swelling, mass and lump: Secondary | ICD-10-CM | POA: Diagnosis not present

## 2018-03-12 DIAGNOSIS — Z681 Body mass index (BMI) 19 or less, adult: Secondary | ICD-10-CM | POA: Diagnosis not present

## 2018-03-12 DIAGNOSIS — Z5111 Encounter for antineoplastic chemotherapy: Secondary | ICD-10-CM | POA: Diagnosis not present

## 2018-03-12 DIAGNOSIS — C763 Malignant neoplasm of pelvis: Secondary | ICD-10-CM | POA: Diagnosis not present

## 2018-03-12 DIAGNOSIS — Z90722 Acquired absence of ovaries, bilateral: Secondary | ICD-10-CM | POA: Diagnosis not present

## 2018-03-12 DIAGNOSIS — C786 Secondary malignant neoplasm of retroperitoneum and peritoneum: Secondary | ICD-10-CM | POA: Diagnosis not present

## 2018-03-12 DIAGNOSIS — Z87891 Personal history of nicotine dependence: Secondary | ICD-10-CM | POA: Diagnosis not present

## 2018-03-12 DIAGNOSIS — E43 Unspecified severe protein-calorie malnutrition: Secondary | ICD-10-CM | POA: Diagnosis not present

## 2018-03-12 DIAGNOSIS — C801 Malignant (primary) neoplasm, unspecified: Secondary | ICD-10-CM | POA: Diagnosis not present

## 2018-03-12 DIAGNOSIS — R1084 Generalized abdominal pain: Secondary | ICD-10-CM | POA: Diagnosis not present

## 2018-03-12 DIAGNOSIS — I959 Hypotension, unspecified: Secondary | ICD-10-CM | POA: Diagnosis not present

## 2018-03-12 DIAGNOSIS — K5900 Constipation, unspecified: Secondary | ICD-10-CM | POA: Diagnosis not present

## 2018-03-12 DIAGNOSIS — C569 Malignant neoplasm of unspecified ovary: Secondary | ICD-10-CM | POA: Diagnosis not present

## 2018-03-12 DIAGNOSIS — R000 Tachycardia, unspecified: Secondary | ICD-10-CM | POA: Diagnosis not present

## 2018-03-12 DIAGNOSIS — K598 Other specified functional intestinal disorders: Secondary | ICD-10-CM | POA: Diagnosis not present

## 2018-03-12 DIAGNOSIS — Z713 Dietary counseling and surveillance: Secondary | ICD-10-CM | POA: Diagnosis not present

## 2018-03-13 ENCOUNTER — Encounter (HOSPITAL_COMMUNITY): Payer: Self-pay

## 2018-03-13 ENCOUNTER — Other Ambulatory Visit: Payer: Self-pay

## 2018-03-13 ENCOUNTER — Emergency Department (HOSPITAL_COMMUNITY): Payer: Medicare Other

## 2018-03-13 ENCOUNTER — Inpatient Hospital Stay (HOSPITAL_COMMUNITY)
Admission: EM | Admit: 2018-03-13 | Discharge: 2018-03-15 | DRG: 388 | Disposition: A | Discharge status: Home / Self Care | Payer: Medicare Other | Attending: Family Medicine | Admitting: Family Medicine

## 2018-03-13 DIAGNOSIS — I252 Old myocardial infarction: Secondary | ICD-10-CM | POA: Diagnosis not present

## 2018-03-13 DIAGNOSIS — Z66 Do not resuscitate: Secondary | ICD-10-CM | POA: Diagnosis present

## 2018-03-13 DIAGNOSIS — E785 Hyperlipidemia, unspecified: Secondary | ICD-10-CM | POA: Diagnosis present

## 2018-03-13 DIAGNOSIS — Z791 Long term (current) use of non-steroidal anti-inflammatories (NSAID): Secondary | ICD-10-CM

## 2018-03-13 DIAGNOSIS — Z825 Family history of asthma and other chronic lower respiratory diseases: Secondary | ICD-10-CM | POA: Diagnosis not present

## 2018-03-13 DIAGNOSIS — I25118 Atherosclerotic heart disease of native coronary artery with other forms of angina pectoris: Secondary | ICD-10-CM | POA: Diagnosis not present

## 2018-03-13 DIAGNOSIS — C801 Malignant (primary) neoplasm, unspecified: Secondary | ICD-10-CM

## 2018-03-13 DIAGNOSIS — F419 Anxiety disorder, unspecified: Secondary | ICD-10-CM | POA: Diagnosis present

## 2018-03-13 DIAGNOSIS — R110 Nausea: Secondary | ICD-10-CM | POA: Diagnosis not present

## 2018-03-13 DIAGNOSIS — Z9049 Acquired absence of other specified parts of digestive tract: Secondary | ICD-10-CM | POA: Diagnosis not present

## 2018-03-13 DIAGNOSIS — Z85038 Personal history of other malignant neoplasm of large intestine: Secondary | ICD-10-CM

## 2018-03-13 DIAGNOSIS — E43 Unspecified severe protein-calorie malnutrition: Secondary | ICD-10-CM | POA: Diagnosis present

## 2018-03-13 DIAGNOSIS — R188 Other ascites: Secondary | ICD-10-CM | POA: Diagnosis present

## 2018-03-13 DIAGNOSIS — C800 Disseminated malignant neoplasm, unspecified: Secondary | ICD-10-CM | POA: Diagnosis not present

## 2018-03-13 DIAGNOSIS — R112 Nausea with vomiting, unspecified: Secondary | ICD-10-CM

## 2018-03-13 DIAGNOSIS — Z79899 Other long term (current) drug therapy: Secondary | ICD-10-CM

## 2018-03-13 DIAGNOSIS — L89151 Pressure ulcer of sacral region, stage 1: Secondary | ICD-10-CM | POA: Diagnosis present

## 2018-03-13 DIAGNOSIS — C786 Secondary malignant neoplasm of retroperitoneum and peritoneum: Secondary | ICD-10-CM | POA: Diagnosis present

## 2018-03-13 DIAGNOSIS — Z823 Family history of stroke: Secondary | ICD-10-CM

## 2018-03-13 DIAGNOSIS — K567 Ileus, unspecified: Secondary | ICD-10-CM | POA: Diagnosis present

## 2018-03-13 DIAGNOSIS — Z7982 Long term (current) use of aspirin: Secondary | ICD-10-CM

## 2018-03-13 DIAGNOSIS — I5032 Chronic diastolic (congestive) heart failure: Secondary | ICD-10-CM | POA: Diagnosis present

## 2018-03-13 DIAGNOSIS — Z87891 Personal history of nicotine dependence: Secondary | ICD-10-CM

## 2018-03-13 DIAGNOSIS — N838 Other noninflammatory disorders of ovary, fallopian tube and broad ligament: Secondary | ICD-10-CM | POA: Diagnosis present

## 2018-03-13 DIAGNOSIS — N839 Noninflammatory disorder of ovary, fallopian tube and broad ligament, unspecified: Secondary | ICD-10-CM | POA: Diagnosis present

## 2018-03-13 DIAGNOSIS — E876 Hypokalemia: Secondary | ICD-10-CM | POA: Diagnosis not present

## 2018-03-13 DIAGNOSIS — I255 Ischemic cardiomyopathy: Secondary | ICD-10-CM | POA: Diagnosis present

## 2018-03-13 DIAGNOSIS — K565 Intestinal adhesions [bands], unspecified as to partial versus complete obstruction: Secondary | ICD-10-CM | POA: Diagnosis not present

## 2018-03-13 DIAGNOSIS — K56609 Unspecified intestinal obstruction, unspecified as to partial versus complete obstruction: Secondary | ICD-10-CM | POA: Diagnosis present

## 2018-03-13 DIAGNOSIS — Z833 Family history of diabetes mellitus: Secondary | ICD-10-CM

## 2018-03-13 DIAGNOSIS — Z808 Family history of malignant neoplasm of other organs or systems: Secondary | ICD-10-CM | POA: Diagnosis not present

## 2018-03-13 DIAGNOSIS — I2510 Atherosclerotic heart disease of native coronary artery without angina pectoris: Secondary | ICD-10-CM | POA: Diagnosis present

## 2018-03-13 DIAGNOSIS — Z8249 Family history of ischemic heart disease and other diseases of the circulatory system: Secondary | ICD-10-CM

## 2018-03-13 DIAGNOSIS — C189 Malignant neoplasm of colon, unspecified: Secondary | ICD-10-CM | POA: Diagnosis not present

## 2018-03-13 DIAGNOSIS — Z681 Body mass index (BMI) 19 or less, adult: Secondary | ICD-10-CM

## 2018-03-13 DIAGNOSIS — Z5329 Procedure and treatment not carried out because of patient's decision for other reasons: Secondary | ICD-10-CM | POA: Diagnosis present

## 2018-03-13 DIAGNOSIS — K56600 Partial intestinal obstruction, unspecified as to cause: Secondary | ICD-10-CM | POA: Diagnosis not present

## 2018-03-13 DIAGNOSIS — K5900 Constipation, unspecified: Secondary | ICD-10-CM | POA: Diagnosis not present

## 2018-03-13 LAB — CBC WITH DIFFERENTIAL/PLATELET
ABS IMMATURE GRANULOCYTES: 0.03 10*3/uL (ref 0.00–0.07)
BASOS ABS: 0 10*3/uL (ref 0.0–0.1)
Basophils Relative: 0 %
Eosinophils Absolute: 0 10*3/uL (ref 0.0–0.5)
Eosinophils Relative: 0 %
HCT: 34.9 % — ABNORMAL LOW (ref 36.0–46.0)
HEMOGLOBIN: 11.4 g/dL — AB (ref 12.0–15.0)
IMMATURE GRANULOCYTES: 1 %
LYMPHS ABS: 1.4 10*3/uL (ref 0.7–4.0)
LYMPHS PCT: 22 %
MCH: 33.3 pg (ref 26.0–34.0)
MCHC: 32.7 g/dL (ref 30.0–36.0)
MCV: 102 fL — ABNORMAL HIGH (ref 80.0–100.0)
Monocytes Absolute: 0.6 10*3/uL (ref 0.1–1.0)
Monocytes Relative: 9 %
NEUTROS PCT: 68 %
NRBC: 0 % (ref 0.0–0.2)
Neutro Abs: 4.4 10*3/uL (ref 1.7–7.7)
Platelets: 230 10*3/uL (ref 150–400)
RBC: 3.42 MIL/uL — ABNORMAL LOW (ref 3.87–5.11)
RDW: 12.9 % (ref 11.5–15.5)
WBC: 6.4 10*3/uL (ref 4.0–10.5)

## 2018-03-13 LAB — COMPREHENSIVE METABOLIC PANEL
ALBUMIN: 3.2 g/dL — AB (ref 3.5–5.0)
ALK PHOS: 98 U/L (ref 38–126)
ALT: 29 U/L (ref 0–44)
ANION GAP: 14 (ref 5–15)
AST: 24 U/L (ref 15–41)
BUN: 27 mg/dL — ABNORMAL HIGH (ref 8–23)
CALCIUM: 8.8 mg/dL — AB (ref 8.9–10.3)
CO2: 26 mmol/L (ref 22–32)
Chloride: 99 mmol/L (ref 98–111)
Creatinine, Ser: 0.66 mg/dL (ref 0.44–1.00)
GFR calc Af Amer: >60 mL/min (ref >60)
GFR calc non Af Amer: >60 mL/min (ref >60)
GLUCOSE: 122 mg/dL — AB (ref 70–99)
Potassium: 3.3 mmol/L — ABNORMAL LOW (ref 3.5–5.1)
SODIUM: 139 mmol/L (ref 135–145)
Total Bilirubin: 1.1 mg/dL (ref 0.3–1.2)
Total Protein: 6.3 g/dL — ABNORMAL LOW (ref 6.5–8.1)

## 2018-03-13 LAB — GLUCOSE, CAPILLARY: Glucose-Capillary: 114 mg/dL — ABNORMAL HIGH (ref 70–99)

## 2018-03-13 LAB — I-STAT CG4 LACTIC ACID, ED: Lactic Acid, Venous: 1.12 mmol/L (ref 0.5–1.9)

## 2018-03-13 LAB — CBG MONITORING, ED: GLUCOSE-CAPILLARY: 93 mg/dL (ref 70–99)

## 2018-03-13 LAB — LIPASE, BLOOD: Lipase: 154 U/L — ABNORMAL HIGH (ref 11–51)

## 2018-03-13 MED ORDER — ONDANSETRON HCL 4 MG PO TABS: 4.0000 mg | ORAL_TABLET | Freq: Four times a day (QID) | ORAL | Status: DC | PRN

## 2018-03-13 MED ORDER — PROCHLORPERAZINE EDISYLATE 10 MG/2ML IJ SOLN
10.0000 mg | Freq: Once | INTRAMUSCULAR | Status: AC
  Administered 2018-03-13: 10 mg via INTRAVENOUS
  Filled 2018-03-13: qty 2

## 2018-03-13 MED ORDER — ONDANSETRON HCL 4 MG/2ML IJ SOLN
4.0000 mg | Freq: Once | INTRAMUSCULAR | Status: AC
  Administered 2018-03-13: 4 mg via INTRAVENOUS
  Filled 2018-03-13: qty 2

## 2018-03-13 MED ORDER — ONDANSETRON HCL 4 MG/2ML IJ SOLN
4.0000 mg | Freq: Four times a day (QID) | INTRAMUSCULAR | Status: DC | PRN
  Administered 2018-03-13: 4 mg via INTRAVENOUS
  Filled 2018-03-13 (×2): qty 2

## 2018-03-13 MED ORDER — SODIUM CHLORIDE 0.9 % IV BOLUS
1000.0000 mL | Freq: Once | INTRAVENOUS | Status: AC
  Administered 2018-03-13: 1000 mL via INTRAVENOUS

## 2018-03-13 MED ORDER — HYDROMORPHONE HCL 1 MG/ML IJ SOLN
0.5000 mg | Freq: Once | INTRAMUSCULAR | Status: AC
  Administered 2018-03-13: 0.5 mg via INTRAVENOUS
  Filled 2018-03-13: qty 1

## 2018-03-13 MED ORDER — POLYETHYLENE GLYCOL 3350 17 G PO PACK: 17.0000 g | PACK | Freq: Every day | ORAL | Status: DC | PRN

## 2018-03-13 MED ORDER — KETOROLAC TROMETHAMINE 15 MG/ML IJ SOLN
15.0000 mg | Freq: Four times a day (QID) | INTRAMUSCULAR | Status: DC | PRN
  Administered 2018-03-13: 15 mg via INTRAVENOUS
  Filled 2018-03-13: qty 1

## 2018-03-13 MED ORDER — ENOXAPARIN SODIUM 40 MG/0.4ML ~~LOC~~ SOLN
40.0000 mg | SUBCUTANEOUS | Status: DC
  Administered 2018-03-13 – 2018-03-14 (×2): 40 mg via SUBCUTANEOUS
  Filled 2018-03-13: qty 0.4

## 2018-03-13 MED ORDER — INSULIN ASPART 100 UNIT/ML ~~LOC~~ SOLN
0.0000 [IU] | Freq: Four times a day (QID) | SUBCUTANEOUS | Status: DC
  Administered 2018-03-13: 0 [IU] via SUBCUTANEOUS

## 2018-03-13 MED ORDER — DEXTROSE-NACL 5-0.45 % IV SOLN
INTRAVENOUS | Status: AC
  Administered 2018-03-13 – 2018-03-14 (×2): via INTRAVENOUS

## 2018-03-13 NOTE — ED Provider Notes (Signed)
[facility] DEPT Provider Note   CSN: [id number] Arrival date & time: 03/13/18  1045     History   Chief Complaint Chief Complaint  Patient presents with  . Constipation  . Abdominal Pain    HPI Carol Ferguson is a 67 y.o. female.  Carol Ferguson is a 67 y.o. Female with history of stage IV colon cancer with carcinomatosis, CAD, ischemic cardiomyopathy, and multiple bowel obstructions, who presents to the emergency department for evaluation of generalized abdominal pain.  She reports she has not been able to have a bowel movement or pass gas for the past 4 days, and has had worsening abdominal pain and distention.  She reports she feels extremely nauseated but has not vomited.  She reports she feels she repeatedly needs to belch.  She reports tenderness is worse in the lower abdomen.  No fevers or chills.  No chest pain, shortness of breath or cough.  She denies any dysuria or urinary frequency.  Her oncologist and cancer care is done at [facility] and she reports she went there yesterday, was supposed to have a chemo infusion of FOLFOX, but they held off on this given her symptoms, they did a CT scan, but she does not know the results of the scan.  Reports she was given pain medication and nausea medication all day yesterday with some improvement in her symptoms but today they seem to be worse.  Was admitted to the hospital here on 11/15 for partial bowel obstruction which improved with bowel rest and IV fluids and the patient was discharged home.     Past Medical History:  Diagnosis Date  . Anemia   . Anxiety   . 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 CAD (coronary artery disease)    a. Inf STEMI/LHC 09/2016 which showed occluded mid LCx treated with PCI, DES; remaining cath details included a proximal to mid LAD lesion of 20% stenosis, and proximal to mid RCA 25% stenosis. EF 55-60%.  . Cancer ([place])   . Clotting disorder ([place])   . Depression   .  Diverticulosis   . Family history of melanoma   . Family history of throat cancer   . Gallstones   . Ischemic cardiomyopathy    a. 2D Echo 09/28/16 showed EF 55-60%, probable severe hypokinesis of the entire inferiormyocardium, normal diastolic parameters.  . Myocardial infarction ([place]) 09/26/2016  . Pneumonia   . Presence of tooth-root and mandibular implants   . Tobacco abuse   . Wears glasses     Patient Active Problem List   Diagnosis Date Noted  . Pressure injury of skin 03/08/2018  . 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 Partial bowel obstruction ([place]) 03/06/2018  . Genetic testing 01/20/2018  . Encounter for antineoplastic chemotherapy 01/13/2018  . Family history of melanoma   . Family history of throat cancer   . Pancytopenia, acquired ([place]) 01/05/2018  . Depression 09/21/2017  . Other insomnia 09/21/2017  . Abdominal pain   . Terminal care   . Abdominal distention   . Palliative care by specialist   . Protein-calorie malnutrition, severe 08/15/2017  . Other constipation 08/04/2017  . 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 Mild protein-calorie malnutrition ([place]) 08/04/2017  . Anxiety state 07/16/2017  . Nausea without vomiting 07/16/2017  . Weight loss 07/16/2017  . 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 Goals of care, counseling/discussion 07/15/2017  . SBO (small bowel obstruction) (  [place]) 07/12/2017  . Left tubo-ovarian mass 07/02/2017  . Peritoneal carcinomatosis ([place]) 07/02/2017  . Diverticulosis 01/10/2017  . Cervical mass 12/27/2016  . CAD (coronary artery disease), native coronary artery 11/27/2016  . Chronic diastolic heart failure ([place]) 09/27/2016  . Hyperlipidemia with target LDL less than 70 09/27/2016  . 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 Lumbar degenerative disc disease 06/14/2016    Past Surgical History:  Procedure Laterality Date  . CERVICAL CONIZATION W/BX N/A  01/28/2017   Procedure: CONIZATION CERVIX WITH BIOPSY;  Surgeon: [name], MD;  Location: [facility];  Service: Gynecology;  Laterality: N/A;  . CHOLECYSTECTOMY    . CORONARY STENT INTERVENTION N/A 09/26/2016   Procedure: Coronary Stent Intervention;  Surgeon: Belva Crome, MD;  Location: [place];  Service: Cardiovascular;  Laterality: N/A;  . DILATION AND CURETTAGE OF UTERUS N/A 01/28/2017   Procedure: DILATATION AND CURETTAGE WITH ULTRASOUND GUIDENCE;  Surgeon: [name], MD;  Location: [facility];  Service: Gynecology;  Laterality: N/A;  . LEFT HEART CATH AND CORONARY ANGIOGRAPHY N/A 09/26/2016   Procedure: Left Heart Cath and Coronary Angiography;  Surgeon: Belva Crome, MD;  Location: [facility];  Service: Cardiovascular;  Laterality: N/A;     OB History    Gravida  0   Para  0   Term  0   Preterm  0   AB  0   Living  0     SAB  0   TAB  0   Ectopic  0   Multiple  0   Live Births  0            Home Medications    Prior to Admission medications   Medication Sig Start Date End Date Taking? Authorizing Provider  acetaminophen (TYLENOL) 500 MG tablet Take 500 mg by mouth every 6 (six) hours as needed for moderate pain.   Yes [provider]  aspirin EC 81 MG tablet Take 81 mg by mouth daily.   Yes [provider]  dexamethasone (DECADRON) 4 MG tablet Take 5 tabs at the night before and 5 tab the morning of chemotherapy, every 3 weeks, by mouth Patient taking differently: Take 4-8 mg by mouth as directed. Take 2 tablets Daily for 2 Days then Take 1 tablet Daily for 3 Days after chemo 01/05/18  Yes Alvy Bimler, Ni, MD  FLUoxetine (PROZAC) 20 MG capsule Take 1 capsule (20 mg total) by mouth daily. 10/30/17  Yes Timmothy Euler, [place] D, PA-C  ibuprofen (ADVIL,MOTRIN) 600 MG tablet Take 600 mg by mouth every 6 (six) hours as needed for moderate pain.  02/09/18  Yes [provider]  magnesium  hydroxide (MILK OF MAGNESIA) 400 MG/5ML suspension Take 30 mLs by mouth daily as needed for mild constipation.   Yes [provider]  mirtazapine (REMERON) 15 MG tablet TAKE ONE TABLET BY MOUTH AT BEDTIME Patient taking differently: Take 15 mg by mouth at bedtime.  01/20/18  Yes Gorsuch, Ni, MD  morphine (MSIR) 15 MG tablet Take 1 tablet (15 mg total) by mouth every 4 (four) hours as needed for severe pain. 12/30/17  Yes Gorsuch, Ni, MD  ondansetron (ZOFRAN) 8 MG tablet Take 1 tablet (8 mg total) by mouth every 8 (eight) hours as needed for nausea. 10/09/17  Yes Heath Lark, MD  prochlorperazine (COMPAZINE) 10 MG tablet Take 1 tablet (10 mg total) by mouth every 6 (six) hours as needed for  nausea or vomiting. 10/31/17  Yes Gorsuch, Ni, MD  sennosides-docusate sodium (SENOKOT-S) 8.6-50 MG tablet Take 2 tablets by mouth 2 (two) times daily.    Yes [provider]  vitamin B-12 1000 MCG tablet Take 1 tablet (1,000 mcg total) by mouth daily. Patient not taking: Reported on 03/13/2018 03/08/18   Florencia Reasons, MD    Family History Family History  Problem Relation Age of Onset  . COPD Mother        d. 63  . Melanoma Brother        dx 63, d 48, stage IV  . Heart disease Father        d. 76s  . Stroke Father   . Heart disease Brother        d. 15  . Diabetes Brother   . Heart attack Brother   . Heart failure Brother   . Throat cancer Maternal Aunt        d. 65  . Colon cancer Neg Hx   . Liver cancer Neg Hx   . Pancreatic cancer Neg Hx   . Rectal cancer Neg Hx   . Stomach cancer Neg Hx     Social History Social History   Tobacco Use  . Smoking status: Former Smoker    Packs/day: 1.00    Years: 35.00    Pack years: 35.00    Types: Cigarettes    Last attempt to quit: 09/26/2016    Years since quitting: 1.4  . Smokeless tobacco: Never Used  Substance Use Topics  . Alcohol use: Not Currently  . Drug use: No     Allergies   Flagyl [metronidazole]   Review of  Systems Review of Systems  Constitutional: Negative for chills and fever.  HENT: Negative.   Eyes: Negative for visual disturbance.  Respiratory: Negative for cough and shortness of breath.   Cardiovascular: Negative for chest pain.  Gastrointestinal: Positive for abdominal distention, abdominal pain, constipation and nausea. Negative for blood in stool, diarrhea and vomiting.  Genitourinary: Negative for dysuria, flank pain, frequency, hematuria, vaginal bleeding and vaginal discharge.  Musculoskeletal: Negative for arthralgias and myalgias.  Skin: Negative for color change and rash.  Neurological: Negative for dizziness, syncope and light-headedness.     Physical Exam Updated Vital Signs BP 127/69 (BP Location: Left Arm)   Pulse 96   Temp (!) 97.5 F (36.4 C) (Oral)   Resp 18   Ht 5\' 5"  (1.651 m)   Wt 48.1 kg   SpO2 98%   BMI 17.64 kg/m   Physical Exam  Constitutional: She is oriented to person, place, and time. She appears well-developed and well-nourished.  Patient appears uncomfortable but is in no acute distress  HENT:  Head: Normocephalic and atraumatic.  Mouth/Throat: Oropharynx is clear and moist.  Eyes: Right eye exhibits no discharge. Left eye exhibits no discharge.  Cardiovascular: Normal rate, regular rhythm, normal heart sounds and intact distal pulses. Exam reveals no gallop and no friction rub.  No murmur heard. Pulmonary/Chest: Effort normal and breath sounds normal. No respiratory distress.  Respirations equal and unlabored, patient able to speak in full sentences, lungs clear to auscultation bilaterally  Abdominal: Normal appearance. She exhibits distension. Bowel sounds are absent. There is generalized tenderness. There is guarding. There is no rigidity, no rebound and no CVA tenderness.  Abdomen is mildly distended, bowel sounds are absent, diffuse tenderness throughout with mild guarding, no focal area of tenderness although the lower abdomen is slightly  worse  Neurological: She is alert and oriented to person, place, and time. Coordination normal.  Skin: Skin is warm and dry. Capillary refill takes less than 2 seconds. She is not diaphoretic.  Psychiatric: She has a normal mood and affect. Her behavior is normal.  Nursing note and vitals reviewed.    ED Treatments / Results  Labs (all labs ordered are listed, but only abnormal results are displayed) Labs Reviewed  COMPREHENSIVE METABOLIC PANEL - Abnormal; Notable for the following components:      Result Value   Potassium 3.3 (*)    Glucose, Bld 122 (*)    BUN 27 (*)    Calcium 8.8 (*)    Total Protein 6.3 (*)    Albumin 3.2 (*)    All other components within normal limits  LIPASE, BLOOD - Abnormal; Notable for the following components:   Lipase 154 (*)    All other components within normal limits  CBC WITH DIFFERENTIAL/PLATELET - Abnormal; Notable for the following components:   RBC 3.42 (*)    Hemoglobin 11.4 (*)    HCT 34.9 (*)    MCV 102.0 (*)    All other components within normal limits  URINALYSIS, ROUTINE W REFLEX MICROSCOPIC  CBC  CREATININE, SERUM  I-STAT CG4 LACTIC ACID, ED    EKG None  Radiology Dg Abdomen 1 View  Result Date: 03/13/2018 CLINICAL DATA:  Colon cancer with new onset of nausea, vomiting and abdominal pain for 4 days. EXAM: ABDOMEN - 1 VIEW COMPARISON:  11/15 and 11/16 2019 FINDINGS: The lung bases are grossly clear. A catheter tip is noted in the right atrium. Moderate gaseous distention of the stomach is noted. There are persistent air-filled loops of small bowel with mild to moderate dilatation. Further clearing of the colon is noted. Findings consistent with a small bowel obstruction. IMPRESSION: Persistent small-bowel obstruction bowel gas pattern.  No free air. Electronically Signed   By: Marijo Sanes M.D.   On: 03/13/2018 12:19    Procedures Procedures (including critical care time)  Medications Ordered in ED Medications  enoxaparin  (LOVENOX) injection 40 mg (has no administration in time range)  dextrose 5 %-0.45 % sodium chloride infusion (has no administration in time range)  ketorolac (TORADOL) 15 MG/ML injection 15 mg (has no administration in time range)  polyethylene glycol (MIRALAX / GLYCOLAX) packet 17 g (has no administration in time range)  ondansetron (ZOFRAN) tablet 4 mg (has no administration in time range)    Or  ondansetron (ZOFRAN) injection 4 mg (has no administration in time range)  insulin aspart (novoLOG) injection 0-9 Units (has no administration in time range)  sodium chloride 0.9 % bolus 1,000 mL (1,000 mLs Intravenous New Bag/Given 03/13/18 1145)  HYDROmorphone (DILAUDID) injection 0.5 mg (0.5 mg Intravenous Given 03/13/18 1145)  ondansetron (ZOFRAN) injection 4 mg (4 mg Intravenous Given 03/13/18 1145)  prochlorperazine (COMPAZINE) injection 10 mg (10 mg Intravenous Given 03/13/18 1205)     Initial Impression / Assessment and Plan / ED Course  I have reviewed the triage vital signs and the nursing notes.  Pertinent labs & imaging results that were available during my care of the patient were reviewed by me and considered in my medical decision making (see chart for details).  Patient presents with generalized abdominal pain, unable to pass bowel movement or gas for the past 4 days, history of recurrent small bowel obstructions and this consistent with her previous symptoms.  She reports doing extremely nauseated but has not had any  emesis.  No fevers.  Patient was admitted for similar symptoms last week, and obstruction seem to improve with symptomatic treatment.  Patient reports she had CT scan done at [facility] yesterday where her oncologist is located but has not gotten the results from this yet.  Chart review shows patient had CT of the chest abdomen and pelvis done yesterday which showed diffusely dilated small bowel with air and stool within the majority of the colon which could be ileus versus  early small bowel obstruction especially in the setting of patient's known carcinomatosis.  Get abdominal labs and KUB and give fluids, Zofran and pain medication.  Labs show no leukocytosis, stable hemoglobin, mild hypokalemia of 3.3, no other acute electrolyte derangements requiring intervention, normal renal and liver function, lipase is slightly elevated at 154, CT scan done yesterday did not show any evidence of inflammation surrounding the pancreas.  Lactic acid is not elevated.  Urinalysis pending.  KUB shows bowel gas pattern consistent with small bowel obstruction.  Will consult surgery and plan for hospitalist admission.  Patient reports good symptomatic control at this time and is refusing NG tube, although she reports with one episode of vomiting she had an apartment this helped her pain.  Case discussed with surgery PA, who recommends hospitalist admission and surgery will consult.  As discussed with Dr. Reesa Chew with [facility] hospitalist who will see and admit the patient.  Final Clinical Impressions(s) / ED Diagnoses   Final diagnoses:  Small bowel obstruction [facility])    ED Discharge Orders    None       Jacqlyn Larsen, [place] 03/13/18 1404    Virgel Manifold, MD 03/14/18 [phone]

## 2018-03-13 NOTE — ED Notes (Signed)
Pt ambulated to restroom but forgot urine specimen was needed.

## 2018-03-13 NOTE — ED Notes (Signed)
ED TO INPATIENT HANDOFF REPORT  Name/Age/Gender Carol Ferguson 67 y.o. female  Code Status    Code Status Orders  (From admission, onward)         Start     Ordered   03/13/18 1304  Do not attempt resuscitation (DNR)  Continuous    Question Answer Comment  In the event of cardiac or respiratory ARREST Do not call a "code blue"   In the event of cardiac or respiratory ARREST Do not perform Intubation, CPR, defibrillation or ACLS   In the event of cardiac or respiratory ARREST Use medication by any route, position, wound care, and other measures to relive pain and suffering. May use oxygen, suction and manual treatment of airway obstruction as needed for comfort.      03/13/18 1303        Code Status History    Date Active Date Inactive Code Status Order ID Comments User Context   03/13/2018 1254 03/13/2018 1303 Full Code [id number]  Damita Lack, MD ED   03/07/2018 0053 03/08/2018 1911 DNR [id number]  Gwynne Edinger, MD Inpatient   08/16/2017 [phone] 08/21/2017 1805 DNR [id number]  Irean Hong, NP Inpatient   08/13/2017 2253 08/16/2017 0943 Full Code [id number]  Rise Patience, MD ED   07/12/2017 1003 07/14/2017 2015 Full Code [id number]  Elwin Mocha, MD Inpatient   09/26/2016 1147 09/28/2016 1438 Full Code [id number]  Belva Crome, MD Inpatient   09/26/2016 1147 09/26/2016 1147 Full Code [id number]  Leanor Kail, PA Inpatient    Advance Directive Documentation     Most Recent Value  Type of Advance Directive  Healthcare Power of Attorney  Pre-existing out of facility DNR order (yellow form or pink MOST form)  -  "MOST" Form in Place?  -      Home/SNF/Other Home  Chief Complaint bowel obstruction/abd pain  Level of Care/Admitting Diagnosis ED Disposition    ED Disposition Condition [place]: [facility] [[id number]]  Level of Care: Med-Surg [16]  Diagnosis: SBO (small bowel obstruction) [facility]) [[id number]]  Admitting  Physician: Gerlean Ren [facility] [[id number]]  Attending Physician: Gerlean Ren [facility] [[id number]]  Estimated length of stay: past midnight tomorrow  Certification:: I certify this patient will need inpatient services for at least 2 midnights  PT Class (Do Not Modify): Inpatient [101]  PT Acc Code (Do Not Modify): Private [1]       Medical History Past Medical History:  Diagnosis Date  . Anemia   . Anxiety   . 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 CAD (coronary artery disease)    a. Inf STEMI/LHC 09/2016 which showed occluded mid LCx treated with PCI, DES; remaining cath details included a proximal to mid LAD lesion of 20% stenosis, and proximal to mid RCA 25% stenosis. EF 55-60%.  . Cancer ([place])   . Clotting disorder ([place])   . Depression   . Diverticulosis   . Family history of melanoma   . Family history of throat cancer   . Gallstones   . Ischemic cardiomyopathy    a. 2D Echo 09/28/16 showed EF 55-60%, probable severe hypokinesis of the entire inferiormyocardium, normal diastolic parameters.  . Myocardial infarction ([place]) 09/26/2016  . Pneumonia   . Presence of tooth-root and mandibular implants   . Tobacco abuse   . Wears glasses     Allergies Allergies  Allergen Reactions  . Flagyl [Metronidazole] Nausea Only  IV Location/Drains/Wounds Patient Lines/Drains/Airways Status   Active Line/Drains/Airways    Name:   Placement date:   Placement time:   Site:   Days:   Implanted Port Right Chest   -    -    Chest      PICC Single Lumen 50/56/97 PICC Right Basilic   94/80/16    5537    Basilic   482   Pressure Injury 03/08/18 Stage I -  Intact skin with non-blanchable redness of a localized area usually over a bony prominence.   03/08/18    0544     5   Wound / Incision (Open or Dehisced) 07/11/17 Puncture Abdomen Left puncture site from biopsy   07/11/17    1328    Abdomen   245          Labs/Imaging Results for orders placed or performed during the hospital encounter  of 03/13/18 (from the past 48 hour(s))  Comprehensive metabolic panel     Status: Abnormal   Collection Time: 03/13/18 11:48 AM  Result Value Ref Range   Sodium 139 135 - 145 mmol/L   Potassium 3.3 (L) 3.5 - 5.1 mmol/L   Chloride 99 98 - 111 mmol/L   CO2 26 22 - 32 mmol/L   Glucose, Bld 122 (H) 70 - 99 mg/dL   BUN 27 (H) 8 - 23 mg/dL   Creatinine, Ser 0.66 0.44 - 1.00 mg/dL   Calcium 8.8 (L) 8.9 - 10.3 mg/dL   Total Protein 6.3 (L) 6.5 - 8.1 g/dL   Albumin 3.2 (L) 3.5 - 5.0 g/dL   AST 24 15 - 41 U/L   ALT 29 0 - 44 U/L   Alkaline Phosphatase 98 38 - 126 U/L   Total Bilirubin 1.1 0.3 - 1.2 mg/dL   GFR calc non Af Amer >60 >60 mL/min   GFR calc Af Amer >60 >60 mL/min    Comment: (NOTE) The eGFR has been calculated using the CKD EPI equation. This calculation has not been validated in all clinical situations. eGFR's persistently <60 mL/min signify possible Chronic Kidney Disease.    Anion gap 14 5 - 15    Comment: Performed at [facility], [place] [address], [place], [place] [address]  Lipase, blood     Status: Abnormal   Collection Time: 03/13/18 11:48 AM  Result Value Ref Range   Lipase 154 (H) 11 - 51 U/L    Comment: Performed at [facility], [place] [address] [place], [place] [address]  CBC with Differential     Status: Abnormal   Collection Time: 03/13/18 11:48 AM  Result Value Ref Range   WBC 6.4 4.0 - 10.5 K/uL   RBC 3.42 (L) 3.87 - 5.11 MIL/uL   Hemoglobin 11.4 (L) 12.0 - 15.0 g/dL   HCT 34.9 (L) 36.0 - 46.0 %   MCV 102.0 (H) 80.0 - 100.0 fL   MCH 33.3 26.0 - 34.0 pg   MCHC 32.7 30.0 - 36.0 g/dL   RDW 12.9 11.5 - 15.5 %   Platelets 230 150 - 400 K/uL   nRBC 0.0 0.0 - 0.2 %   Neutrophils Relative % 68 %   Neutro Abs 4.4 1.7 - 7.7 K/uL   Lymphocytes Relative 22 %   Lymphs Abs 1.4 0.7 - 4.0 K/uL   Monocytes Relative 9 %   Monocytes Absolute 0.6 0.1 - 1.0 K/uL   Eosinophils Relative 0 %   Eosinophils Absolute 0.0 0.0 - 0.5  K/uL    Basophils Relative 0 %   Basophils Absolute 0.0 0.0 - 0.1 K/uL   Immature Granulocytes 1 %   Abs Immature Granulocytes 0.03 0.00 - 0.07 K/uL    Comment: Performed at [facility], [place] [address] [place], [place] [address]  I-Stat CG4 Lactic Acid, ED     Status: None   Collection Time: 03/13/18 11:55 AM  Result Value Ref Range   Lactic Acid, Venous 1.12 0.5 - 1.9 mmol/L  CBG monitoring, ED     Status: None   Collection Time: 03/13/18  3:34 PM  Result Value Ref Range   Glucose-Capillary 93 70 - 99 mg/dL   Dg Abdomen 1 View  Result Date: 03/13/2018 CLINICAL DATA:  Colon cancer with new onset of nausea, vomiting and abdominal pain for 4 days. EXAM: ABDOMEN - 1 VIEW COMPARISON:  11/15 and 11/16 2019 FINDINGS: The lung bases are grossly clear. A catheter tip is noted in the right atrium. Moderate gaseous distention of the stomach is noted. There are persistent air-filled loops of small bowel with mild to moderate dilatation. Further clearing of the colon is noted. Findings consistent with a small bowel obstruction. IMPRESSION: Persistent small-bowel obstruction bowel gas pattern.  No free air. Electronically Signed   By: Marijo Sanes M.D.   On: 03/13/2018 12:19   None  Pending Labs Unresulted Labs (From admission, onward)    Start     Ordered   03/20/18 0500  Creatinine, serum  (enoxaparin (LOVENOX)    CrCl >/= 30 ml/min)  Weekly,   R    Comments:  while on enoxaparin therapy    03/13/18 1254   03/14/18 0500  Comprehensive metabolic panel  Tomorrow morning,   R     03/13/18 1254   03/14/18 0500  CBC  Tomorrow morning,   R     03/13/18 1254   03/13/18 1252  CBC  (enoxaparin (LOVENOX)    CrCl >/= 30 ml/min)  Once,   R    Comments:  Baseline for enoxaparin therapy IF NOT ALREADY DRAWN.  Notify MD if PLT < 100 K.    03/13/18 1254   03/13/18 1252  Creatinine, serum  (enoxaparin (LOVENOX)    CrCl >/= 30 ml/min)  Once,   R    Comments:  Baseline for enoxaparin therapy  IF NOT ALREADY DRAWN.    03/13/18 1254   03/13/18 1110  Urinalysis, Routine w reflex microscopic  ONCE - STAT,   STAT     03/13/18 1109          Vitals/Pain Today's Vitals   03/13/18 1230 03/13/18 1243 03/13/18 1412 03/13/18 1505  BP: 117/87  98/62   Ferguson: (!) 102  93   Resp: 16  16   Temp:      TempSrc:      SpO2: 98%  98%   Weight:      Height:      PainSc:  4   0-No pain    Isolation Precautions No active isolations  Medications Medications  enoxaparin (LOVENOX) injection 40 mg (has no administration in time range)  dextrose 5 %-0.45 % sodium chloride infusion ( Intravenous New Bag/Given 03/13/18 1541)  ketorolac (TORADOL) 15 MG/ML injection 15 mg (has no administration in time range)  polyethylene glycol (MIRALAX / GLYCOLAX) packet 17 g (has no administration in time range)  ondansetron (ZOFRAN) tablet 4 mg ( Oral See Alternative 03/13/18 1404)    Or  ondansetron (ZOFRAN) injection 4  mg (4 mg Intravenous Given 03/13/18 1404)  insulin aspart (novoLOG) injection 0-9 Units (has no administration in time range)  sodium chloride 0.9 % bolus 1,000 mL (0 mLs Intravenous Stopped 03/13/18 1353)  HYDROmorphone (DILAUDID) injection 0.5 mg (0.5 mg Intravenous Given 03/13/18 1145)  ondansetron (ZOFRAN) injection 4 mg (4 mg Intravenous Given 03/13/18 1145)  prochlorperazine (COMPAZINE) injection 10 mg (10 mg Intravenous Given 03/13/18 1205)    Mobility walks

## 2018-03-13 NOTE — ED Triage Notes (Signed)
EMS reports from home, Hx of stage 4 colon cancer, no BM x 4 days, abdominal pain and nausea. Lower quadrants tender upon palpation.  BP 153/84 HR 94  RR 16 Sp02 99 RA

## 2018-03-13 NOTE — Progress Notes (Signed)
[facility]/Trauma Progress Note      Assessment/Plan 67 year old patient with over 1 year history of abdominal carcinomatosis and history of previous bowel obstructions.  Multiple rounds of chemotherapy.  Most recently exploratory laparotomy and omental biopsy at [facility] February 03, 2018.  Was admitted for nausea, crampy abdominal pain, inability to tolerate oral intake and obstipation on 11/16 and returns today with same complaints. Pt improved within one day and was discharged on 11/17.  SBO vs ileus vs inflammatory adhesions from recent surgery vs tumor obstruction - CT abd/pel 11/21 at [facility] showed Redemonstration of adnexal masses consistent with biopsy-proven malignancy. No definite new sites of disease. Diffusely dilated small bowel with air and stool within the majority of the colon. In the setting of recent surgery, this is favored to represent ileus. However, given known carcinomatosis and history of small bowel obstruction, an early small bowel obstruction could have a similar appearance. Small ascites and probable peritoneal carcinomatosis.   - Pt refusing NGT, discussed risk of aspiration and she expressed understanding - recommend bowel rest, IVF and NPO  FEN: NPO, IVF, NGT if pt will allow VTE: SCD's, chemical prophylaxis per medicine  ID: none indicated Foley: none Follow up: TBD  DISPO: recommend bowel rest, NPO, IVF and repeat xray in the am. We will follow.     LOS: 0 days    Subjective: CC: nausea, vomiting and abdominal pain  Pt states she has not had a BM in 4 days but has had some flatus. She states she vomited 3 times today. She has some abdominal discomfort. Her oncologist and cancer care is done at [facility] and she reports she went there yesterday, was supposed to have a chemo infusion of FOLFOX, but they held off on this given her symptoms. They did a CT scan. She denies fever or chills.   Objective: Vital signs in last 24 hours: Temp:  [97.5 F (36.4  C)] 97.5 F (36.4 C) (11/22 1100) Pulse Rate:  [93-103] 93 (11/22 1412) Resp:  [16-18] 16 (11/22 1412) BP: (98-135)/(62-87) 98/62 (11/22 1412) SpO2:  [98 %-100 %] 98 % (11/22 1412) Weight:  [48.1 kg] 48.1 kg (11/22 1100)    Intake/Output from previous day: No intake/output data recorded. Intake/Output this shift: No intake/output data recorded.  PE: Gen:  Alert, NAD, pleasant, thin, frail Card:  RRR, no M/G/R heard, 2 + radial pulses bilaterally Pulm:  CTA, no W/R/R, rate and effort normal Abd: ND, hyperactive BS, mild generalized TTP without guarding, no peritonitis, ex lap scar is well healed.   Skin: no rashes noted, warm and dry   Anti-infectives: Anti-infectives (From admission, onward)   None      Lab Results:  Recent Labs    03/13/18 1148  WBC 6.4  HGB 11.4*  HCT 34.9*  PLT 230   BMET Recent Labs    03/13/18 1148  NA 139  K 3.3*  CL 99  CO2 26  GLUCOSE 122*  BUN 27*  CREATININE 0.66  CALCIUM 8.8*   PT/INR No results for input(s): LABPROT, INR in the last 72 hours. CMP     Component Value Date/Time   NA 139 03/13/2018 1148   NA 143 09/26/2017   K 3.3 (L) 03/13/2018 1148   CL 99 03/13/2018 1148   CO2 26 03/13/2018 1148   GLUCOSE 122 (H) 03/13/2018 1148   BUN 27 (H) 03/13/2018 1148   BUN 20 09/26/2017   CREATININE 0.66 03/13/2018 1148   CREATININE 0.75 01/12/2018 1340  CALCIUM 8.8 (L) 03/13/2018 1148   PROT 6.3 (L) 03/13/2018 1148   PROT 6.7 11/28/2016 0849   ALBUMIN 3.2 (L) 03/13/2018 1148   ALBUMIN 4.3 11/28/2016 0849   AST 24 03/13/2018 1148   AST 20 01/12/2018 1340   ALT 29 03/13/2018 1148   ALT 15 01/12/2018 1340   ALKPHOS 98 03/13/2018 1148   BILITOT 1.1 03/13/2018 1148   BILITOT 0.4 01/12/2018 1340   GFRNONAA >60 03/13/2018 1148   GFRNONAA >60 01/12/2018 1340   GFRAA >60 03/13/2018 1148   GFRAA >60 01/12/2018 1340   Lipase     Component Value Date/Time   LIPASE 154 (H) 03/13/2018 1148    Studies/Results: Dg Abdomen  1 View  Result Date: 03/13/2018 CLINICAL DATA:  Colon cancer with new onset of nausea, vomiting and abdominal pain for 4 days. EXAM: ABDOMEN - 1 VIEW COMPARISON:  11/15 and 11/16 2019 FINDINGS: The lung bases are grossly clear. A catheter tip is noted in the right atrium. Moderate gaseous distention of the stomach is noted. There are persistent air-filled loops of small bowel with mild to moderate dilatation. Further clearing of the colon is noted. Findings consistent with a small bowel obstruction. IMPRESSION: Persistent small-bowel obstruction bowel gas pattern.  No free air. Electronically Signed   By: Marijo Sanes M.D.   On: 03/13/2018 12:19      Kalman Drape , [facility] 03/13/2018, 2:38 PM  Pager: [phone] Mon-Wed, Friday 7:00am-4:30pm Thurs 7am-11:30am  Consults: [phone]

## 2018-03-13 NOTE — H&P (Signed)
History and Physical    Carol Ferguson:[id number] DOB: 06-Apr-1951 DOA: 03/13/2018  PCP: [name], PA-C Patient coming from: Home  Chief Complaint: Nausea vomiting  HPI: Carol Ferguson is a 67 y.o. female with medical history significant of with past medical history of peritoneal carcinomatosis-adenocarcinoma stage IV, coronary artery disease, protein calorie malnutrition comes to the hospital with complaints of abdominal pain, nausea and vomiting.  Patient states she received her last chemotherapy about 2 weeks ago and about 5 days ago she started experiencing generalized abdominal discomfort.  In the last 4 days she has not had any bowel movement and has noticed increasing abdominal distention.  No episodes of vomiting at home but she had an episode of vomiting in the ER which made her symptoms feel better.  Denies any fevers, chills and other complaints. Previously she has had multiple episodes of small bowel obstruction which were treated conservatively. She also had a CT of the abdomen pelvis at [facility] yesterday and abdominal x-ray here today which confirms small bowel obstruction.  Rest of the labs appear to be within normal limits.  In the ER she refused to have NG tube placed as she feels better after an episode of vomiting.  She would like to have this managed more conservatively.  General surgery has been notified by the ER provider.  Niece in the bedside during my evaluation.  Social history-denies any tobacco, alcohol and illicit drug use.  Review of Systems: As per HPI otherwise 10 point review of systems negative.  Review of Systems Otherwise negative except as per HPI, including: General: Denies fever, chills, night sweats or unintended weight loss. Resp: Denies cough, wheezing, shortness of breath. Cardiac: Denies chest pain, palpitations, orthopnea, paroxysmal nocturnal dyspnea. GI: Denies  diarrhea or constipation GU: Denies dysuria, frequency, hesitancy or  incontinence MS: Denies muscle aches, joint pain or swelling Neuro: Denies headache, neurologic deficits (focal weakness, numbness, tingling), abnormal gait Psych: Denies anxiety, depression, SI/HI/AVH Skin: Denies new rashes or lesions ID: Denies sick contacts, exotic exposures, travel  Past Medical History:  Diagnosis Date  . Anemia   . Anxiety   . 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 CAD (coronary artery disease)    a. Inf STEMI/LHC 09/2016 which showed occluded mid LCx treated with PCI, DES; remaining cath details included a proximal to mid LAD lesion of 20% stenosis, and proximal to mid RCA 25% stenosis. EF 55-60%.  . Cancer ([place])   . Clotting disorder ([place])   . Depression   . Diverticulosis   . Family history of melanoma   . Family history of throat cancer   . Gallstones   . Ischemic cardiomyopathy    a. 2D Echo 09/28/16 showed EF 55-60%, probable severe hypokinesis of the entire inferiormyocardium, normal diastolic parameters.  . Myocardial infarction ([place]) 09/26/2016  . Pneumonia   . Presence of tooth-root and mandibular implants   . Tobacco abuse   . Wears glasses     Past Surgical History:  Procedure Laterality Date  . CERVICAL CONIZATION W/BX N/A 01/28/2017   Procedure: CONIZATION CERVIX WITH BIOPSY;  Surgeon: [name], MD;  Location: [facility];  Service: Gynecology;  Laterality: N/A;  . CHOLECYSTECTOMY    . CORONARY STENT INTERVENTION N/A 09/26/2016   Procedure: Coronary Stent Intervention;  Surgeon: Belva Crome, MD;  Location: [place] [facility];  Service: Cardiovascular;  Laterality: N/A;  . DILATION AND CURETTAGE OF UTERUS N/A 01/28/2017  Procedure: DILATATION AND CURETTAGE WITH ULTRASOUND GUIDENCE;  Surgeon: [name], MD;  Location[facility];  Service: Gynecology;  Laterality: N/A;  . LEFT HEART CATH AND CORONARY ANGIOGRAPHY N/A 09/26/2016   Procedure: Left Heart Cath and Coronary Angiography;  Surgeon: Belva Crome, MD;  Location: [place] [facility];  Service: Cardiovascular;  Laterality: N/A;    SOCIAL HISTORY:  reports that she quit smoking about 17 months ago. Her smoking use included cigarettes. She has a 35.00 pack-year smoking history. She has never used smokeless tobacco. She reports that she drank alcohol. She reports that she does not use drugs.  Allergies  Allergen Reactions  . Flagyl [Metronidazole] Nausea Only    FAMILY HISTORY: Family History  Problem Relation Age of Onset  . COPD Mother        d. 74  . Melanoma Brother        dx 81, d 69, stage IV  . Heart disease Father        d. 83s  . Stroke Father   . Heart disease Brother        d. 1  . Diabetes Brother   . Heart attack Brother   . Heart failure Brother   . Throat cancer Maternal Aunt        d. 4  . Colon cancer Neg Hx   . Liver cancer Neg Hx   . Pancreatic cancer Neg Hx   . Rectal cancer Neg Hx   . Stomach cancer Neg Hx      Prior to Admission medications   Medication Sig Start Date End Date Taking? Authorizing Provider  acetaminophen (TYLENOL) 500 MG tablet Take 500 mg by mouth every 6 (six) hours as needed for moderate pain.   Yes [provider]  aspirin EC 81 MG tablet Take 81 mg by mouth daily.   Yes [provider]  dexamethasone (DECADRON) 4 MG tablet Take 5 tabs at the night before and 5 tab the morning of chemotherapy, every 3 weeks, by mouth Patient taking differently: Take 4-8 mg by mouth as directed. Take 2 tablets Daily for 2 Days then Take 1 tablet Daily for 3 Days after chemo 01/05/18  Yes Alvy Bimler, Ni, MD  FLUoxetine (PROZAC) 20 MG capsule Take 1 capsule (20 mg total) by mouth daily. 10/30/17  Yes Timmothy Euler, [place] D, PA-C  ibuprofen (ADVIL,MOTRIN) 600 MG tablet Take 600 mg by mouth every 6 (six) hours as needed for moderate pain.  02/09/18  Yes [provider]  magnesium hydroxide (MILK OF MAGNESIA) 400 MG/5ML suspension Take 30 mLs by mouth daily as needed for mild  constipation.   Yes [provider]  mirtazapine (REMERON) 15 MG tablet TAKE ONE TABLET BY MOUTH AT BEDTIME Patient taking differently: Take 15 mg by mouth at bedtime.  01/20/18  Yes Gorsuch, Ni, MD  morphine (MSIR) 15 MG tablet Take 1 tablet (15 mg total) by mouth every 4 (four) hours as needed for severe pain. 12/30/17  Yes Gorsuch, Ni, MD  ondansetron (ZOFRAN) 8 MG tablet Take 1 tablet (8 mg total) by mouth every 8 (eight) hours as needed for nausea. 10/09/17  Yes Heath Lark, MD  prochlorperazine (COMPAZINE) 10 MG tablet Take 1 tablet (10 mg total) by mouth every 6 (six) hours as needed for nausea or vomiting. 10/31/17  Yes Gorsuch, Ni, MD  sennosides-docusate sodium (SENOKOT-S) 8.6-50 MG tablet Take 2 tablets by mouth 2 (two) times daily.    Yes [provider]  vitamin B-12 1000 MCG tablet Take 1 tablet (1,000 mcg total) by mouth daily. Patient not taking: Reported on 03/13/2018 03/08/18   Florencia Reasons, MD    Physical Exam: Vitals:   03/13/18 1100 03/13/18 1130 03/13/18 1200 03/13/18 1230  BP: 131/79 134/64 135/66 117/87  Pulse: 99 (!) 102 (!) 103 (!) 102  Resp: 18   16  Temp: (!) 97.5 F (36.4 C)     TempSrc: Oral     SpO2: 99% 100% 98% 98%  Weight: 48.1 kg     Height: 5\' 5"  (1.651 m)         Constitutional: NAD, calm, comfortable, bilateral temporal wasting.  Hair loss Eyes: PERRL, lids and conjunctivae normal ENMT: Mucous membranes are moist. Posterior pharynx clear of any exudate or lesions.Normal dentition.  Neck: normal, supple, no masses, no thyromegaly Respiratory: clear to auscultation bilaterally, no wheezing, no crackles. Normal respiratory effort. No accessory muscle use.  Cardiovascular: Regular rate and rhythm, no murmurs / rubs / gallops. No extremity edema. 2+ pedal pulses. No carotid bruits.  Abdomen: no tenderness, no masses palpated. No hepatosplenomegaly. Bowel sounds positive.  Musculoskeletal: no clubbing / cyanosis. No joint deformity upper and  lower extremities. Good ROM, no contractures. Normal muscle tone.  Skin: no rashes, lesions, ulcers. No induration Neurologic: CN 2-12 grossly intact. Sensation intact, DTR normal. Strength 5/5 in all 4.  Psychiatric: Normal judgment and insight. Alert and oriented x 3. Normal mood.     Labs on Admission: I have personally reviewed following labs and imaging studies  CBC: Recent Labs  Lab 03/06/18 2128 03/07/18 0130 03/13/18 1148  WBC 10.5 8.7 6.4  NEUTROABS 7.8*  --  4.4  HGB 13.4 12.1 11.4*  HCT 40.1 36.7 34.9*  MCV 101.5* 104.9* 102.0*  PLT 234 203 948   Basic Metabolic Panel: Recent Labs  Lab 03/06/18 2128 03/07/18 0130 03/08/18 0349 03/13/18 1148  NA 138 139 135 139  K 3.5 3.6 3.8 3.3*  CL 95* 98 101 99  CO2 32 33* 28 26  GLUCOSE 160* 139* 130* 122*  BUN 35* 34* 18 27*  CREATININE 0.85 0.86  0.90 0.69 0.66  CALCIUM 8.7* 8.5* 8.3* 8.8*  MG  --   --  2.1  --    GFR: Estimated Creatinine Clearance: 50.4 mL/min (by C-G formula based on SCr of 0.66 mg/dL). Liver Function Tests: Recent Labs  Lab 03/06/18 2128 03/13/18 1148  AST 24 24  ALT 29 29  ALKPHOS 103 98  BILITOT 0.7 1.1  PROT 7.0 6.3*  ALBUMIN 3.4* 3.2*   Recent Labs  Lab 03/06/18 2128 03/13/18 1148  LIPASE 23 154*   No results for input(s): AMMONIA in the last 168 hours. Coagulation Profile: No results for input(s): INR, PROTIME in the last 168 hours. Cardiac Enzymes: No results for input(s): CKTOTAL, CKMB, CKMBINDEX, TROPONINI in the last 168 hours. BNP (last 3 results) No results for input(s): PROBNP in the last 8760 hours. HbA1C: No results for input(s): HGBA1C in the last 72 hours. CBG: No results for input(s): GLUCAP in the last 168 hours. Lipid Profile: No results for input(s): CHOL, HDL, LDLCALC, TRIG, CHOLHDL, LDLDIRECT in the last 72 hours. Thyroid Function Tests: No results for input(s): TSH, T4TOTAL, FREET4, T3FREE, THYROIDAB in the last 72 hours. Anemia Panel: No results  for input(s): VITAMINB12, FOLATE, FERRITIN, TIBC, IRON, RETICCTPCT in the last 72 hours. Urine analysis:    Component Value Date/Time   COLORURINE YELLOW 08/13/2017 [place]  08/13/2017 1906   LABSPEC 1.026 08/13/2017 1906   PHURINE 5.0 08/13/2017 1906   GLUCOSEU NEGATIVE 08/13/2017 1906   HGBUR MODERATE (A) 08/13/2017 1906   BILIRUBINUR NEGATIVE 08/13/2017 1906   BILIRUBINUR small (A) 12/03/2016 0917   KETONESUR 20 (A) 08/13/2017 1906   PROTEINUR NEGATIVE 08/13/2017 1906   UROBILINOGEN 0.2 12/03/2016 0917   UROBILINOGEN 0.2 08/06/2010 0246   NITRITE NEGATIVE 08/13/2017 1906   LEUKOCYTESUR NEGATIVE 08/13/2017 1906   Sepsis Labs: !!!!!!!!!!!!!!!!!!!!!!!!!!!!!!!!!!!!!!!!!!!! @LABRCNTIP (procalcitonin:4,lacticidven:4) )No results found for this or any previous visit (from the past 240 hour(s)).   Radiological Exams on Admission: Dg Abdomen 1 View  Result Date: 03/13/2018 CLINICAL DATA:  Colon cancer with new onset of nausea, vomiting and abdominal pain for 4 days. EXAM: ABDOMEN - 1 VIEW COMPARISON:  11/15 and 11/16 2019 FINDINGS: The lung bases are grossly clear. A catheter tip is noted in the right atrium. Moderate gaseous distention of the stomach is noted. There are persistent air-filled loops of small bowel with mild to moderate dilatation. Further clearing of the colon is noted. Findings consistent with a small bowel obstruction. IMPRESSION: Persistent small-bowel obstruction bowel gas pattern.  No free air. Electronically Signed   By: Marijo Sanes M.D.   On: 03/13/2018 12:19     All images have been reviewed by me personally.    Assessment/Plan Principal Problem:   SBO (small bowel obstruction) (HCC) Active Problems:   Chronic diastolic heart failure (HCC)   Hyperlipidemia with target LDL less than 70   CAD (coronary artery disease), native coronary artery   Left tubo-ovarian mass   Peritoneal carcinomatosis (HCC)   Nausea & vomiting   Protein-calorie  malnutrition, severe   Adenocarcinoma carcinomatosis (HCC)   Nausea and vomiting secondary to small bowel obstruction -Admit the patient to the hospital for further care and monitoring.  Had a CT scan at [facility] yesterday and abdominal x-ray here with confirms this. - Refusing NG tube at this time and would like conservative management.  We will keep her n.p.o. - Accu-Cheks every 6 hours, supportive care, D5 half-normal saline -Monitor urine output. -General surgery has been consulted  Adenocarcinoma with carcinomatosis Peritoneal, stage IV - On FOLFOX, follows outpatient at [facility].  Coronary artery disease status post stenting 2018 -Currently she is chest pain-free.  Follows outpatient with Dr. Tamala Julian.  Continue aspirin and statin when she is able to take oral  Severe protein calorie malnutrition -Recently seen by nutrition.  Encourage oral intake.  Stage I sacral pressure ulcer -Supportive care.   DVT prophylaxis: Lovenox Code Status: DNR/DNI Family Communication: Niece at bedside Disposition Plan:  TBD Consults called: Gen Surgery called by ED Admission status: Inpatient Med Surg   Time Spent: 65 minutes.  >50% of the time was devoted to discussing the patients care, assessment, plan and disposition with other care givers along with counseling the patient about the risks and benefits of treatment.    Seibert Keeter Arsenio Loader MD [facility] Pager [phone]  If 7PM-7AM, please contact night-coverage [email or web address] Password [email or web address]  03/13/2018, 1:02 PM

## 2018-03-14 ENCOUNTER — Inpatient Hospital Stay (HOSPITAL_COMMUNITY): Payer: Medicare Other

## 2018-03-14 DIAGNOSIS — E876 Hypokalemia: Secondary | ICD-10-CM

## 2018-03-14 LAB — CBC
HCT: 29.2 % — ABNORMAL LOW (ref 36.0–46.0)
Hemoglobin: 9.4 g/dL — ABNORMAL LOW (ref 12.0–15.0)
MCH: 33.7 pg (ref 26.0–34.0)
MCHC: 32.2 g/dL (ref 30.0–36.0)
MCV: 104.7 fL — AB (ref 80.0–100.0)
NRBC: 0 % (ref 0.0–0.2)
PLATELETS: 163 10*3/uL (ref 150–400)
RBC: 2.79 MIL/uL — ABNORMAL LOW (ref 3.87–5.11)
RDW: 13.2 % (ref 11.5–15.5)
WBC: 5 10*3/uL (ref 4.0–10.5)

## 2018-03-14 LAB — URINALYSIS, ROUTINE W REFLEX MICROSCOPIC
BILIRUBIN URINE: NEGATIVE
Glucose, UA: NEGATIVE mg/dL
Hgb urine dipstick: NEGATIVE
Ketones, ur: NEGATIVE mg/dL
Leukocytes, UA: NEGATIVE
NITRITE: NEGATIVE
PH: 6 (ref 5.0–8.0)
PROTEIN: NEGATIVE mg/dL
SPECIFIC GRAVITY, URINE: 1.02 (ref 1.005–1.030)

## 2018-03-14 LAB — GLUCOSE, CAPILLARY
GLUCOSE-CAPILLARY: 85 mg/dL (ref 70–99)
Glucose-Capillary: 83 mg/dL (ref 70–99)
Glucose-Capillary: 92 mg/dL (ref 70–99)
Glucose-Capillary: 87 mg/dL (ref 70–99)

## 2018-03-14 LAB — COMPREHENSIVE METABOLIC PANEL
ALK PHOS: 73 U/L (ref 38–126)
ALT: 20 U/L (ref 0–44)
AST: 15 U/L (ref 15–41)
Albumin: 2.6 g/dL — ABNORMAL LOW (ref 3.5–5.0)
Anion gap: 6 (ref 5–15)
BUN: 22 mg/dL (ref 8–23)
CALCIUM: 8 mg/dL — AB (ref 8.9–10.3)
CO2: 29 mmol/L (ref 22–32)
CREATININE: 0.56 mg/dL (ref 0.44–1.00)
Chloride: 102 mmol/L (ref 98–111)
GFR calc non Af Amer: >60 mL/min (ref >60)
GLUCOSE: 101 mg/dL — AB (ref 70–99)
Potassium: 3 mmol/L — ABNORMAL LOW (ref 3.5–5.1)
SODIUM: 137 mmol/L (ref 135–145)
Total Bilirubin: 0.7 mg/dL (ref 0.3–1.2)
Total Protein: 5 g/dL — ABNORMAL LOW (ref 6.5–8.1)

## 2018-03-14 MED ORDER — FLUOXETINE HCL 20 MG PO CAPS
20.0000 mg | ORAL_CAPSULE | Freq: Every day | ORAL | Status: DC
  Administered 2018-03-14 – 2018-03-15 (×2): 20 mg via ORAL
  Filled 2018-03-14 (×2): qty 1

## 2018-03-14 MED ORDER — POTASSIUM CHLORIDE 10 MEQ/100ML IV SOLN
10.0000 meq | INTRAVENOUS | Status: AC
  Administered 2018-03-14 (×6): 10 meq via INTRAVENOUS
  Filled 2018-03-14: qty 100

## 2018-03-14 MED ORDER — MIRTAZAPINE 15 MG PO TABS
15.0000 mg | ORAL_TABLET | Freq: Every day | ORAL | Status: DC
  Administered 2018-03-14: 15 mg via ORAL
  Filled 2018-03-14: qty 1

## 2018-03-14 MED ORDER — ASPIRIN EC 81 MG PO TBEC
81.0000 mg | DELAYED_RELEASE_TABLET | Freq: Every day | ORAL | Status: DC
  Administered 2018-03-14 – 2018-03-15 (×2): 81 mg via ORAL
  Filled 2018-03-14 (×2): qty 1

## 2018-03-14 NOTE — Progress Notes (Signed)
Patient ID: Carol Ferguson, female   DOB: 05/14/1950, 67 y.o.   MRN: [id number]    Acute Care Surgery Service Progress Note:    Chief Complaint/Subjective: Wants to eat No n/v Belly is less hard and is soft +BM this am without aid Some flatus Family member at [facility]  Objective: Vital signs in last 24 hours: Temp:  [98.3 F (36.8 C)-98.5 F (36.9 C)] 98.3 F (36.8 C) (11/23 1331) Pulse Rate:  [84-107] 84 (11/23 1331) Resp:  [16-17] 16 (11/23 1331) BP: (98-118)/(57-75) 100/70 (11/23 1331) SpO2:  [95 %-99 %] 99 % (11/23 1331) Last BM Date: 03/09/18  Intake/Output from previous day: 11/22 0701 - 11/23 0700 In: 3129.9 [I.V.:1031.5; IV Piggyback:2098.4] Out: 200 [Urine:200] Intake/Output this shift: No intake/output data recorded.  Lungs: cta, nonlabored  Cardiovascular: reg  Abd: soft, nt, min distension  Extremities: no edema, +SCDs  Neuro: alert, nonfocal  Lab Results: CBC  Recent Labs    03/13/18 1148 03/14/18 0506  WBC 6.4 5.0  HGB 11.4* 9.4*  HCT 34.9* 29.2*  PLT 230 163   BMET Recent Labs    03/13/18 1148 03/14/18 0506  NA 139 137  K 3.3* 3.0*  CL 99 102  CO2 26 29  GLUCOSE 122* 101*  BUN 27* 22  CREATININE 0.66 0.56  CALCIUM 8.8* 8.0*   LFT Hepatic Function Latest Ref Rng & Units 03/14/2018 03/13/2018 03/06/2018  Total Protein 6.5 - 8.1 g/dL 5.0(L) 6.3(L) 7.0  Albumin 3.5 - 5.0 g/dL 2.6(L) 3.2(L) 3.4(L)  AST 15 - 41 U/L _0 ALT 0 - 44 U/L _1 Alk Phosphatase 38 - 126 U/L 73 98 103  Total Bilirubin 0.3 - 1.2 mg/dL 0.7 1.1 0.7  Bilirubin, Direct 0.00 - 0.40 mg/dL - - -   PT/INR No results for input(s): LABPROT, INR in the last 72 hours. ABG No results for input(s): PHART, HCO3 in the last 72 hours.  Invalid input(s): PCO2, PO2  Studies/Results:  Anti-infectives: Anti-infectives (From admission, onward)   None      Medications: Scheduled Meds: . enoxaparin (LOVENOX) injection  40 mg Subcutaneous Q24H  . insulin  aspart  0-9 Units Subcutaneous Q6H   Continuous Infusions: . potassium chloride 10 mEq (03/14/18 1226)   PRN Meds:.ketorolac, ondansetron **OR** ondansetron (ZOFRAN) IV, polyethylene glycol  Assessment/Plan: Patient Active Problem List   Diagnosis Date Noted  . Pressure injury of skin 03/08/2018  . Adenocarcinoma carcinomatosis ([place]) 03/06/2018  . Partial bowel obstruction ([place]) 03/06/2018  . Genetic testing 01/20/2018  . Encounter for antineoplastic chemotherapy 01/13/2018  . Family history of melanoma   . Family history of throat cancer   . Pancytopenia, acquired ([place]) 01/05/2018  . Depression 09/21/2017  . Other insomnia 09/21/2017  . Abdominal pain   . Terminal care   . Abdominal distention   . Palliative care by specialist   . Protein-calorie malnutrition, severe 08/15/2017  . Other constipation 08/04/2017  . 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 Mild protein-calorie malnutrition ([place]) 08/04/2017  . Anxiety state 07/16/2017  . Nausea & vomiting 07/16/2017  . Weight loss 07/16/2017  . 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 Goals of care, counseling/discussion 07/15/2017  . SBO (small bowel obstruction) ([place]) 07/12/2017  . Left tubo-ovarian mass 07/02/2017  . Peritoneal carcinomatosis ([place]) 07/02/2017  . Diverticulosis 01/10/2017  . Cervical mass 12/27/2016  . CAD (coronary artery disease), native coronary artery 11/27/2016  . Chronic diastolic heart failure ([place]) 09/27/2016  .  Hyperlipidemia with target LDL less than 70 09/27/2016  . 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 Lumbar degenerative disc disease 06/14/2016   SBO vs ileus vs inflammatory adhesions from recent surgery vs tumor obstruction - CT abd/pel 11/21 at [facility] showed Redemonstration of adnexal masses consistent with biopsy-proven malignancy. No definite new sites of disease. Diffusely dilated small bowel with air and stool within the majority of  the colon. In the setting of recent surgery, this is favored to represent ileus. However, given known carcinomatosis and history of small bowel obstruction, an early small bowel obstruction could have a similar appearance. Small ascites and probable peritoneal carcinomatosis.   - xray this am still shows dilated SB but she has had no n/v and had BM Will allow clears, told to take slow  FEN: clears today, IVF, hypokalemia - replace VTE: SCD's, chemical prophylaxis per medicine  ID: none indicated Foley: none Follow up: TBD  DISPO: clears today; did discuss diffl dx with pt and family member, answered their questions  LOS: 1 day    Joneisha Miles M. Redmond Pulling, MD, FACS General, Bariatric, & Minimally Invasive Surgery [phone] [facility]

## 2018-03-14 NOTE — Progress Notes (Addendum)
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  Found to have a small bowel obstruction and was admitted for further management.  General surgery following.  Assessment & Plan:   Principal Problem:   SBO (small bowel obstruction) (HCC) Active Problems:   Chronic diastolic heart failure (HCC)   Hyperlipidemia with target LDL less than 70   CAD (coronary artery disease), native coronary artery   Left tubo-ovarian mass   Peritoneal carcinomatosis (HCC)   Nausea & vomiting   Protein-calorie malnutrition, severe   Adenocarcinoma carcinomatosis (HCC)   Hypokalemia   Small bowel obstruction: CT imaging showed small bowel obstruction.  Plan for conservative management.  Denied NG tube.  Currently she looks very comfortable.  No nausea, vomiting or abdominal pain.  Started on clear liquid diet today. She says she has been passing gas. General surgery following.  Continue gentle IV fluids.  Stage IV adenocarcinoma(likely GYN primary) with peritoneal carcinomatosis: Follows at [facility] oncology.  She has an upcoming appointment.  Was on chemotherapy on FOLFOX.  Has a Chemo-Port on the right chest.   Coronary disease: Status post stenting 2018.  Currently chest pain-free.  Follows with outpatient with Dr. Tamala Julian.  Continue aspirin.  Severe protein calorie malnutrition: Patient seen by nutrition.  Encourage oral intake  Stage I sacral pressure ulcer: Continue supportive care  Hypokalemia: Supplemented with potassium   DVT prophylaxis: Lovenox Code Status: DNR Family Communication: None of the bedside Disposition Plan:  Likely home tomorrow   Consultants: General surgery  Procedures: None  Antimicrobials: None  Subjective: Patient seen and examined the bedside this morning.  Remains comfortable.  Her abdomen pain, nausea and vomiting have improved.  Eager to eat.  Passing flatus.  Objective: Vitals:   03/13/18 1600 03/13/18 2108 03/14/18 0515 03/14/18 1331  BP: 118/75 (!) 106/57 107/62 100/70  Pulse: (!) 102 95 87 84  Resp:  17 16 16   Temp:  98.5 F (36.9 C) 98.3 F (36.8 C) 98.3 F (36.8 C)  TempSrc:  Oral Oral Oral  SpO2: 97% 98% 95% 99%  Weight:      Height:        Intake/Output Summary (Last 24 hours) at 03/14/2018 1431 Last data filed at 03/14/2018 9373 Gross per 24 hour  Intake 3129.88 ml  Output 200 ml  Net 2929.88 ml   Filed Weights   03/13/18 1100  Weight: 48.1 kg    Examination:  General exam: Not in distress, cachectic HEENT:PERRL,Oral mucosa moist, Ear/Nose normal on gross exam Respiratory system: Bilateral equal air entry, normal vesicular breath sounds, no wheezes or crackles  Cardiovascular system: S1 & S2 heard, RRR. No JVD, murmurs, rubs, gallops or clicks. No pedal edema.  Chemo-Port on the right chest Gastrointestinal system: Abdomen is nondistended, soft and nontender. No organomegaly or masses felt. Normal bowel sounds heard. Central nervous system: Alert and oriented. No focal neurological deficits. Extremities: No edema, no clubbing ,no cyanosis, distal peripheral pulses palpable. Skin: No rashes, lesions or ulcers,no icterus ,no pallor MSK: Normal muscle bulk,tone ,power Psychiatry: Judgement and insight appear normal. Mood & affect appropriate.     Data Reviewed: I have personally reviewed following labs and imaging studies  CBC: Recent Labs  Lab 03/13/18 1148 03/14/18 0506  WBC 6.4 5.0  NEUTROABS 4.4  --   HGB 11.4* 9.4*  HCT 34.9* 29.2*  MCV 102.0* 104.7*  PLT 230 027   Basic Metabolic Panel: Recent Labs  Lab 03/08/18 0349  03/13/18 1148 03/14/18 0506  NA 135 139 137  K 3.8 3.3* 3.0*  CL 101 99 102  CO2 28 26 29   GLUCOSE 130* 122* 101*  BUN 18 27* 22  CREATININE 0.69 0.66 0.56  CALCIUM 8.3* 8.8* 8.0*  MG 2.1  --   --    GFR: Estimated Creatinine Clearance: 50.4 mL/min (by C-G formula based on SCr of 0.56 mg/dL). Liver Function Tests: Recent Labs  Lab 03/13/18 1148 03/14/18 0506  AST 24 15  ALT 29 20  ALKPHOS 98 73  BILITOT 1.1 0.7  PROT 6.3* 5.0*  ALBUMIN 3.2* 2.6*   Recent Labs  Lab 03/13/18 1148  LIPASE 154*   No results for input(s): AMMONIA in the last 168 hours. Coagulation Profile: No results for input(s): INR, PROTIME in the last 168 hours. Cardiac Enzymes: No results for input(s): CKTOTAL, CKMB, CKMBINDEX, TROPONINI in the last 168 hours. BNP (last 3 results) No results for input(s): PROBNP in the last 8760 hours. HbA1C: No results for input(s): HGBA1C in the last 72 hours. CBG: Recent Labs  Lab 03/13/18 1534 03/13/18 1756 03/13/18 2328 03/14/18 0514 03/14/18 1200  GLUCAP 93 114* 83 92 87   Lipid Profile: No results for input(s): CHOL, HDL, LDLCALC, TRIG, CHOLHDL, LDLDIRECT in the last 72 hours. Thyroid Function Tests: No results for input(s): TSH, T4TOTAL, FREET4, T3FREE, THYROIDAB in the last 72 hours. Anemia Panel: No results for input(s): VITAMINB12, FOLATE, FERRITIN, TIBC, IRON, RETICCTPCT in the last 72 hours. Sepsis Labs: Recent Labs  Lab 03/13/18 1155  LATICACIDVEN 1.12    No results found for this or any previous visit (from the past 240 hour(s)).       Radiology Studies: Dg Abd 1 View  Result Date: 03/14/2018 CLINICAL DATA:  Patient with small bowel obstruction. Improved clinical symptoms. EXAM: ABDOMEN - 1 VIEW COMPARISON:  Abdominal radiograph 03/20/2018 FINDINGS: Upright imaging demonstrates differential air-fluid levels throughout the abdomen. Cholecystectomy clips. Left basilar atelectasis. Lumbar spine degenerative changes. IMPRESSION:  Multiple differential air-fluid levels throughout the abdomen on upright imaging most compatible with small bowel obstruction. Electronically Signed   By: Lovey Newcomer M.D.   On: 03/14/2018 11:29   Dg Abdomen 1 View  Result Date: 03/13/2018 CLINICAL DATA:  Colon cancer with new onset of nausea, vomiting and abdominal pain for 4 days. EXAM: ABDOMEN - 1 VIEW COMPARISON:  11/15 and 11/16 2019 FINDINGS: The lung bases are grossly clear. A catheter tip is noted in the right atrium. Moderate gaseous distention of the stomach is noted. There are persistent air-filled loops of small bowel with mild to moderate dilatation. Further clearing of the colon is noted. Findings consistent with a small bowel obstruction. IMPRESSION: Persistent small-bowel obstruction bowel gas pattern.  No free air. Electronically Signed   By: Marijo Sanes M.D.   On: 03/13/2018 12:19        Scheduled Meds: . enoxaparin (LOVENOX) injection  40 mg Subcutaneous Q24H  . insulin aspart  0-9 Units Subcutaneous Q6H   Continuous Infusions: . potassium chloride 10 mEq (03/14/18 1356)     LOS: 1 day    Time spent: 25 mins.More than 50% of that time was spent in counseling and/or coordination of care.  Shelly Coss, MD [facility] Pager [phone]  If 7PM-7AM, please contact night-coverage [email or web address] Password [facility] 03/14/2018, 2:31 PM

## 2018-03-15 DIAGNOSIS — E876 Hypokalemia: Secondary | ICD-10-CM

## 2018-03-15 DIAGNOSIS — I25118 Atherosclerotic heart disease of native coronary artery with other forms of angina pectoris: Secondary | ICD-10-CM

## 2018-03-15 DIAGNOSIS — K56609 Unspecified intestinal obstruction, unspecified as to partial versus complete obstruction: Secondary | ICD-10-CM

## 2018-03-15 DIAGNOSIS — C800 Disseminated malignant neoplasm, unspecified: Secondary | ICD-10-CM

## 2018-03-15 LAB — BASIC METABOLIC PANEL
Anion gap: 7 (ref 5–15)
BUN: 9 mg/dL (ref 8–23)
CALCIUM: 8.7 mg/dL — AB (ref 8.9–10.3)
CHLORIDE: 108 mmol/L (ref 98–111)
CO2: 27 mmol/L (ref 22–32)
CREATININE: 0.64 mg/dL (ref 0.44–1.00)
GFR calc Af Amer: >60 mL/min (ref >60)
GFR calc non Af Amer: >60 mL/min (ref >60)
GLUCOSE: 98 mg/dL (ref 70–99)
Potassium: 3.7 mmol/L (ref 3.5–5.1)
Sodium: 142 mmol/L (ref 135–145)

## 2018-03-15 LAB — GLUCOSE, CAPILLARY
GLUCOSE-CAPILLARY: 111 mg/dL — AB (ref 70–99)
Glucose-Capillary: 78 mg/dL (ref 70–99)
Glucose-Capillary: 69 mg/dL — ABNORMAL LOW (ref 70–99)
Glucose-Capillary: 126 mg/dL — ABNORMAL HIGH (ref 70–99)

## 2018-03-15 LAB — MAGNESIUM: Magnesium: 1.9 mg/dL (ref 1.7–2.4)

## 2018-03-15 MED ORDER — DIPHENHYDRAMINE HCL 25 MG PO CAPS
25.0000 mg | ORAL_CAPSULE | Freq: Once | ORAL | Status: AC
  Administered 2018-03-15: 25 mg via ORAL
  Filled 2018-03-15: qty 1

## 2018-03-15 NOTE — Progress Notes (Signed)
Patient ID: Carol Ferguson, female   DOB: Sep 07, 1950, 67 y.o.   MRN: [id number]   Acute Care Surgery Service Progress Note:    Chief Complaint/Subjective: No n/v No burping Clear went well Had loose stool overnight No pain  Objective: Vital signs in last 24 hours: Temp:  [98.2 F (36.8 C)-98.6 F (37 C)] 98.2 F (36.8 C) (11/24 0605) Pulse Rate:  [84-95] 95 (11/24 0605) Resp:  [16-18] 18 (11/24 0605) BP: (100-123)/(65-70) 123/66 (11/24 0605) SpO2:  [97 %-99 %] 98 % (11/24 0605) Last BM Date: 03/14/18(diarrhea x2)  Intake/Output from previous day: 11/23 0701 - 11/24 0700 In: 1062.7 [P.O.:240; I.V.:411.9; IV Piggyback:410.8] Out: 225 [Urine:225] Intake/Output this shift: No intake/output data recorded.  Lungs: cta, nonlabored  Cardiovascular: reg  Abd: soft, min distension, NT  Extremities: no edema, +SCDs  Neuro: alert, nonfocal  Lab Results: CBC  Recent Labs    03/13/18 1148 03/14/18 0506  WBC 6.4 5.0  HGB 11.4* 9.4*  HCT 34.9* 29.2*  PLT 230 163   BMET Recent Labs    03/14/18 0506 03/15/18 0423  NA 137 142  K 3.0* 3.7  CL 102 108  CO2 29 27  GLUCOSE 101* 98  BUN 22 9  CREATININE 0.56 0.64  CALCIUM 8.0* 8.7*   LFT Hepatic Function Latest Ref Rng & Units 03/14/2018 03/13/2018 03/06/2018  Total Protein 6.5 - 8.1 g/dL 5.0(L) 6.3(L) 7.0  Albumin 3.5 - 5.0 g/dL 2.6(L) 3.2(L) 3.4(L)  AST 15 - 41 U/L 15 24 24   ALT 0 - 44 U/L 20 29 29   Alk Phosphatase 38 - 126 U/L 73 98 103  Total Bilirubin 0.3 - 1.2 mg/dL 0.7 1.1 0.7  Bilirubin, Direct 0.00 - 0.40 mg/dL - - -   PT/INR No results for input(s): LABPROT, INR in the last 72 hours. ABG No results for input(s): PHART, HCO3 in the last 72 hours.  Invalid input(s): PCO2, PO2  Studies/Results:  Anti-infectives: Anti-infectives (From admission, onward)   None      Medications: Scheduled Meds: . aspirin EC  81 mg Oral Daily  . enoxaparin (LOVENOX) injection  40 mg Subcutaneous Q24H  .  FLUoxetine  20 mg Oral Daily  . insulin aspart  0-9 Units Subcutaneous Q6H  . mirtazapine  15 mg Oral QHS   Continuous Infusions: PRN Meds:.ketorolac, ondansetron **OR** ondansetron (ZOFRAN) IV, polyethylene glycol  Assessment/Plan: Patient Active Problem List   Diagnosis Date Noted  . Hypokalemia 03/14/2018  . Pressure injury of skin 03/08/2018  . Adenocarcinoma carcinomatosis ([place]) 03/06/2018  . Partial bowel obstruction ([place]) 03/06/2018  . Genetic testing 01/20/2018  . Encounter for antineoplastic chemotherapy 01/13/2018  . Family history of melanoma   . Family history of throat cancer   . Pancytopenia, acquired ([place]) 01/05/2018  . Depression 09/21/2017  . Other insomnia 09/21/2017  . Abdominal pain   . Terminal care   . Abdominal distention   . Palliative care by specialist   . Protein-calorie malnutrition, severe 08/15/2017  . Other constipation 08/04/2017  . 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 Mild protein-calorie malnutrition ([place]) 08/04/2017  . Anxiety state 07/16/2017  . Nausea & vomiting 07/16/2017  . Weight loss 07/16/2017  . 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 Goals of care, counseling/discussion 07/15/2017  . SBO (small bowel obstruction) ([place]) 07/12/2017  . Left tubo-ovarian mass 07/02/2017  . Peritoneal carcinomatosis ([place]) 07/02/2017  . Diverticulosis 01/10/2017  . Cervical mass 12/27/2016  . CAD (coronary artery disease), native coronary  artery 11/27/2016  . Chronic diastolic heart failure ([place]) 09/27/2016  . Hyperlipidemia with target LDL less than 70 09/27/2016  . 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 Lumbar degenerative disc disease 06/14/2016   SBO vs ileus vs inflammatory adhesions from recent surgery vs tumor obstruction - CT abd/pel 11/21 at [facility] showedRedemonstration of adnexal masses consistent with biopsy-proven malignancy. No definite new sites of disease. Diffusely dilated  small bowel with air and stool within the majority of the colon. In the setting of recent surgery, this is favored to represent ileus. However, given known carcinomatosis and history of small bowel obstruction, an early small bowel obstruction could have a similar appearance. Small ascites and probable peritoneal carcinomatosis.   QSX:QKSKS today, IVF, hypokalemia - resolved VTE: SCD's,chemical prophylaxis per medicine HN:GITJ indicated Foley:none Follow LL:VDIXV'E need f/u with [facility]  DISPO:fulls today; if tolerates can go home this afternoon; told pt to stay on full liquids/protein shakes for a few days and then transition to SOFT diet  LOS: 2 days    Leighton Ruff. Redmond Pulling, MD, FACS General, Bariatric, & Minimally Invasive Surgery [phone] [facility]

## 2018-03-15 NOTE — Discharge Instructions (Signed)
Stay on full liquid diet for a few days before transitioning to SOFT diet  Full Liquid Diet A full liquid diet may be used:  To help you transition from a clear liquid diet to a soft diet.  When your body is healing and can only tolerate foods that are easy to digest.  Before or after certain a procedure, test, or surgery (such as stomach or intestinal surgeries).  If you have trouble swallowing or chewing.  A full liquid diet includes fluids and foods that are liquid or will become liquid at room temperature. The full liquid diet gives you the proteins, fluids, salts, and minerals that you need for energy. If you continue this diet for more than 72 hours, talk to your health care provider about how many calories you need to consume. If you continue the diet for more than 5 days, talk to your health care provider about taking a multivitamin or a nutritional supplement. What do I need to know about a full liquid diet?  You may have any liquid.  You may have any food that becomes a liquid at room temperature. The food is considered a liquid if it can be poured off a spoon at room temperature.  Drink one serving of citrus or vitamin C-enriched fruit juice daily. What foods can I eat? Grains Any grain food that can be pureed in soup (such as crackers, pasta, and rice). Hot cereal (such as farina or oatmeal) that has been blended. Talk to your health care provider or dietitian about these foods. Vegetables Pulp-free tomato or vegetable juice. Vegetables pureed in soup. Fruits Fruit juice, including nectars and juices with pulp. Meats and Other Protein Sources Eggs in custard, eggnog mix, and eggs used in ice cream or pudding. Strained meats, like in baby food, may be allowed. Consult your health care provider. Dairy Milk and milk-based beverages, including milk shakes and instant breakfast mixes. Smooth yogurt. Pureed cottage cheese. Avoid these foods if they are not well  tolerated. Beverages All beverages, including liquid nutritional supplements. Ask your health care provider if you can have carbonated beverages. They may not be well tolerated. Condiments Iodized salt, pepper, spices, and flavorings. Cocoa powder. Vinegar, ketchup, yellow mustard, smooth sauces (such as hollandaise, cheese sauce, or white sauce), and soy sauce. Sweets and Desserts Custard, smooth pudding. Flavored gelatin. Tapioca, junket. Plain ice cream, sherbet, fruit ices. Frozen ice pops, frozen fudge pops, pudding pops, and other frozen bars with cream. Syrups, including chocolate syrup. Sugar, honey, jelly. Fats and Oils Margarine, butter, cream, sour cream, and oils. Other Broth and cream soups. Strained, broth-based soups. The items listed above may not be a complete list of recommended foods or beverages. Contact your dietitian for more options. What foods can I not eat? Grains All breads. Grains are not allowed unless they are pureed into soup. Vegetables Vegetables are not allowed unless they are juiced, or cooked and pureed into soup. Fruits Fruits are not allowed unless they are juiced. Meats and Other Protein Sources Any meat or fish. Cooked or raw eggs. Nut butters. Dairy Cheese. Condiments Stone ground mustards. Fats and Oils Fats that are coarse or chunky. Sweets and Desserts Ice cream or other frozen desserts that have any solids in them or on top, such as nuts, chocolate chips, and pieces of cookies. Cakes. Cookies. Candy. Others Soups with chunks or pieces in them. The items listed above may not be a complete list of foods and beverages to avoid. Contact your dietitian for  more information. This information is not intended to replace advice given to you by your health care provider. Make sure you discuss any questions you have with your health care provider. Document Released: 04/08/2005 Document Revised: 09/14/2015 Document Reviewed: 02/11/2013 Elsevier  Interactive Patient Education  2017 [employer] Meal Plan A soft-food meal plan includes foods that are safe and easy to swallow. This meal plan typically is used:  If you are having trouble chewing or swallowing foods.  As a transition meal plan after only having had liquid meals for a long period.  What do I need to know about the soft-food meal plan? A soft-food meal plan includes tender foods that are soft and easy to chew and swallow. In most cases, bite-sized pieces of food are easier to swallow. A bite-sized piece is about  inch or smaller. Foods in this plan do not need to be ground or pureed. Foods that are very hard, crunchy, or sticky should be avoided. Also, breads, cereals, yogurts, and desserts with nuts, seeds, or fruits should be avoided. What foods can I eat? Grains Rice and wild rice. Moist bread, dressing, pasta, and noodles. Well-moistened dry or cooked cereals, such as farina (cooked wheat cereal), oatmeal, or grits. Biscuits, breads, muffins, pancakes, and waffles that have been well moistened. Vegetables Shredded lettuce. Cooked, tender vegetables, including potatoes without skins. Vegetable juices. Broths or creamed soups made with vegetables that are not stringy or chewy. Strained tomatoes (without seeds). Fruits Canned or well-cooked fruits. Soft (ripe), peeled fresh fruits, such as peaches, nectarines, kiwi, cantaloupe, honeydew melon, and watermelon (without seeds). Soft berries with small seeds, such as strawberries. Fruit juices (without pulp). Meats and Other Protein Sources Moist, tender, lean beef. Mutton. Lamb. Veal. Chicken. [place]. Liver. Ham. Fish without bones. Eggs. Dairy Milk, milk drinks, and cream. Plain cream cheese and cottage cheese. Plain yogurt. Sweets/Desserts Flavored gelatin desserts. Custard. Plain ice cream, frozen yogurt, sherbet, milk shakes, and malts. Plain cakes and cookies. Plain hard candy. Other Butter, margarine  (without trans fat), and cooking oils. Mayonnaise. Cream sauces. Mild spices, salt, and sugar. Syrup, molasses, honey, and jelly. The items listed above may not be a complete list of recommended foods or beverages. Contact your dietitian for more options. What foods are not recommended? Grains Dry bread, toast, crackers that have not been moistened. Coarse or dry cereals, such as bran, granola, and shredded wheat. Tough or chewy crusty breads, such as [place] bread or baguettes. Vegetables Corn. Raw vegetables except shredded lettuce. Cooked vegetables that are tough or stringy. Tough, crisp, fried potatoes and potato skins. Fruits Fresh fruits with skins or seeds or both, such as apples, pears, or grapes. Stringy, high-pulp fruits, such as papaya, pineapple, coconut, or mango. Fruit leather, fruit roll-ups, and all dried fruits. Meats and Other Protein Sources Sausages and hot dogs. Meats with gristle. Fish with bones. Nuts, seeds, and chunky peanut or other nut butters. Sweets/Desserts Cakes or cookies that are very dry or chewy. The items listed above may not be a complete list of foods and beverages to avoid. Contact your dietitian for more information. This information is not intended to replace advice given to you by your health care provider. Make sure you discuss any questions you have with your health care provider. Document Released: 07/16/2007 Document Revised: 09/14/2015 Document Reviewed: 03/05/2013 Elsevier Interactive Patient Education  2017 [employer].

## 2018-03-15 NOTE — Progress Notes (Signed)
Patient discharged to home, all discharge medications and instructions reviewed and questions answered.  Patient to be assisted to vehicle by wheelchair.  

## 2018-03-15 NOTE — Discharge Summary (Signed)
Physician Discharge Summary  Carol Ferguson [id number] DOB: [birth year] DOA: 03/13/2018  PCP: Tenna Delaine D, PA-C  Admit date: 03/13/2018 Discharge date: 03/15/2018  Time spent: 40 minutes  Recommendations for Outpatient Follow-up:  1. Follow up PCP in 2 weeks   Discharge Diagnoses:  Principal Problem:   SBO (small bowel obstruction) (HCC) Active Problems:   Chronic diastolic heart failure (HCC)   Hyperlipidemia with target LDL less than 70   CAD (coronary artery disease), native coronary artery   Left tubo-ovarian mass   Peritoneal carcinomatosis (HCC)   Nausea & vomiting   Protein-calorie malnutrition, severe   Adenocarcinoma carcinomatosis (HCC)   Hypokalemia   Discharge Condition: Stable  Diet recommendation: Regular diet  Filed Weights   03/13/18 1100  Weight: 48.1 kg    History of present illness:  67 year old female with past medical history of  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  Found to have a small bowel obstruction and was admitted for further management.  General surgery following  Hospital Course:   Small bowel obstruction: Seen on CT scan of the abdomen, general surgery was consulted.  Plan for conservative management.  At this time patient is doing much better on full liquid diet and general surgery has signed off.  Recommended continue with full liquid diet and advance as tolerated.  Stage IV adenocarcinoma(likely GYN primary) with peritoneal carcinomatosis:  Patient follows at [facility] oncology, she was on chemotherapy on FOLFOX.   Coronary artery disease : Status post stenting in 2018, currently she is chest pain-free.  Follow-up cardiology as outpatient.  Continue aspirin.  Severe protein calorie malnutrition: Seen by nutrition, encourage oral  intake.  Hypokalemia: Supplemented with potassium  Procedures:  None  Consultations:  General surgery  Discharge Exam: Vitals:   03/14/18 2031 03/15/18 0605  BP: 110/65 123/66  Pulse: 87 95  Resp: 17 18  Temp: 98.6 F (37 C) 98.2 F (36.8 C)  SpO2: 97% 98%    General: Appears in no acute distress Cardiovascular: S1-S2, regular Respiratory: Clear to auscultation bilaterally  Discharge Instructions   Discharge Instructions    Diet - low sodium heart healthy   Complete by:  As directed    Increase activity slowly   Complete by:  As directed      Allergies as of 03/15/2018      Reactions   Flagyl [metronidazole] Nausea Only      Medication List    TAKE these medications   acetaminophen 500 MG tablet Commonly known as:  TYLENOL Take 500 mg by mouth every 6 (six) hours as needed for moderate pain.   aspirin EC 81 MG tablet Take 81 mg by mouth daily.   cyanocobalamin 1000 MCG tablet Take 1 tablet (1,000 mcg total) by mouth daily.   dexamethasone 4 MG tablet Commonly known as:  DECADRON Take 5 tabs at the night before and 5 tab the morning of chemotherapy, every 3 weeks, by mouth What changed:    how much to take  how to take this  when to take this  additional instructions   FLUoxetine 20 MG capsule Commonly known as:  PROZAC Take 1 capsule (20 mg total) by mouth daily.   ibuprofen 600 MG tablet Commonly known as:  ADVIL,MOTRIN Take 600 mg by mouth every 6 (six) hours as needed for moderate pain.   magnesium hydroxide 400  MG/5ML suspension Commonly known as:  MILK OF MAGNESIA Take 30 mLs by mouth daily as needed for mild constipation.   mirtazapine 15 MG tablet Commonly known as:  REMERON TAKE ONE TABLET BY MOUTH AT BEDTIME   morphine 15 MG tablet Commonly known as:  MSIR Take 1 tablet (15 mg total) by mouth every 4 (four) hours as needed for severe pain.   ondansetron 8 MG tablet Commonly known as:  ZOFRAN Take 1 tablet (8 mg  total) by mouth every 8 (eight) hours as needed for nausea.   prochlorperazine 10 MG tablet Commonly known as:  COMPAZINE Take 1 tablet (10 mg total) by mouth every 6 (six) hours as needed for nausea or vomiting.   sennosides-docusate sodium 8.6-50 MG tablet Commonly known as:  SENOKOT-S Take 2 tablets by mouth 2 (two) times daily.      Allergies  Allergen Reactions  . Flagyl [Metronidazole] Nausea Only      The results of significant diagnostics from this hospitalization (including imaging, microbiology, ancillary and laboratory) are listed below for reference.    Significant Diagnostic Studies: Dg Abd 1 View  Result Date: 03/14/2018 CLINICAL DATA:  Patient with small bowel obstruction. Improved clinical symptoms. EXAM: ABDOMEN - 1 VIEW COMPARISON:  Abdominal radiograph 03/20/2018 FINDINGS: Upright imaging demonstrates differential air-fluid levels throughout the abdomen. Cholecystectomy clips. Left basilar atelectasis. Lumbar spine degenerative changes. IMPRESSION: Multiple differential air-fluid levels throughout the abdomen on upright imaging most compatible with small bowel obstruction. Electronically Signed   By: Lovey Newcomer M.D.   On: 03/14/2018 11:29   Dg Abdomen 1 View  Result Date: 03/13/2018 CLINICAL DATA:  Colon cancer with new onset of nausea, vomiting and abdominal pain for 4 days. EXAM: ABDOMEN - 1 VIEW COMPARISON:  11/15 and 11/16 2019 FINDINGS: The lung bases are grossly clear. A catheter tip is noted in the right atrium. Moderate gaseous distention of the stomach is noted. There are persistent air-filled loops of small bowel with mild to moderate dilatation. Further clearing of the colon is noted. Findings consistent with a small bowel obstruction. IMPRESSION: Persistent small-bowel obstruction bowel gas pattern.  No free air. Electronically Signed   By: Marijo Sanes M.D.   On: 03/13/2018 12:19   Dg Abd 1 View  Result Date: 03/07/2018 CLINICAL DATA:  Small bowel  obstruction EXAM: ABDOMEN - 1 VIEW COMPARISON:  the previous day's study FINDINGS: Persistent dilatation of small bowel centered in the left mid abdomen. Degree of dilatation appears perhaps minimally improved. Number of involved loops appears stable. The colon remains decompressed. Stomach is nondistended. Cholecystectomy clips. Lower thoracic and lumbar spondylitic changes. IMPRESSION: Minimal if any improvement in ileus versus small bowel obstruction since previous day's exam Electronically Signed   By: Lucrezia Europe M.D.   On: 03/07/2018 09:16   Dg Chest Port 1 View  Result Date: 03/07/2018 CLINICAL DATA:  Chest port placement EXAM: PORTABLE CHEST 1 VIEW COMPARISON:  CT chest dated 12/12/2017 FINDINGS: Lungs are clear.  No pleural effusion or pneumothorax. The heart is normal in size. Right chest port terminates in the right atrium, 2 cm below the cavoatrial junction. IMPRESSION: Right chest port terminates in the right atrium, 2 cm below the cavoatrial junction. Electronically Signed   By: Julian Hy M.D.   On: 03/07/2018 12:37   Dg Abd 2 Views  Result Date: 03/06/2018 CLINICAL DATA:  Abdominal pain EXAM: ABDOMEN - 2 VIEW COMPARISON:  01/05/2018 FINDINGS: There is small bowel distention with multiple air-fluid levels. There  is a small amount air in the colon. There is no evidence of pneumoperitoneum, portal venous gas or pneumatosis. There are no pathologic calcifications along the expected course of the ureters. The osseous structures are unremarkable. IMPRESSION: Small bowel distention with multiple air-fluid levels and air in the colon. Differential considerations include an ileus versus partial small bowel obstruction. Electronically Signed   By: Kathreen Devoid   On: 03/06/2018 22:36    Microbiology: No results found for this or any previous visit (from the past 240 hour(s)).   Labs: Basic Metabolic Panel: Recent Labs  Lab 03/13/18 1148 03/14/18 0506 03/15/18 0423  NA 139 137 142   K 3.3* 3.0* 3.7  CL 99 102 108  CO2 26 29 27   GLUCOSE 122* 101* 98  BUN 27* 22 9  CREATININE 0.66 0.56 0.64  CALCIUM 8.8* 8.0* 8.7*  MG  --   --  1.9   Liver Function Tests: Recent Labs  Lab 03/13/18 1148 03/14/18 0506  AST 24 15  ALT 29 20  ALKPHOS 98 73  BILITOT 1.1 0.7  PROT 6.3* 5.0*  ALBUMIN 3.2* 2.6*   Recent Labs  Lab 03/13/18 1148  LIPASE 154*   No results for input(s): AMMONIA in the last 168 hours. CBC: Recent Labs  Lab 03/13/18 1148 03/14/18 0506  WBC 6.4 5.0  NEUTROABS 4.4  --   HGB 11.4* 9.4*  HCT 34.9* 29.2*  MCV 102.0* 104.7*  PLT 230 163    CBG: Recent Labs  Lab 03/14/18 1200 03/14/18 1716 03/14/18 2358 03/15/18 0604 03/15/18 0725  GLUCAP 87 85 78 69* 111*       Signed:  Oswald Hillock MD.  [facility] 03/15/2018, 11:17 AM

## 2018-03-23 DIAGNOSIS — K56600 Partial intestinal obstruction, unspecified as to cause: Secondary | ICD-10-CM | POA: Diagnosis not present

## 2018-03-23 DIAGNOSIS — Z9049 Acquired absence of other specified parts of digestive tract: Secondary | ICD-10-CM | POA: Diagnosis not present

## 2018-03-23 DIAGNOSIS — R970 Elevated carcinoembryonic antigen [CEA]: Secondary | ICD-10-CM | POA: Diagnosis not present

## 2018-03-23 DIAGNOSIS — R971 Elevated cancer antigen 125 [CA 125]: Secondary | ICD-10-CM | POA: Diagnosis not present

## 2018-03-23 DIAGNOSIS — R978 Other abnormal tumor markers: Secondary | ICD-10-CM | POA: Diagnosis not present

## 2018-03-23 DIAGNOSIS — Z87891 Personal history of nicotine dependence: Secondary | ICD-10-CM | POA: Diagnosis not present

## 2018-03-23 DIAGNOSIS — C786 Secondary malignant neoplasm of retroperitoneum and peritoneum: Secondary | ICD-10-CM | POA: Diagnosis not present

## 2018-03-23 DIAGNOSIS — C801 Malignant (primary) neoplasm, unspecified: Secondary | ICD-10-CM | POA: Diagnosis not present

## 2018-03-25 ENCOUNTER — Other Ambulatory Visit: Payer: Self-pay | Admitting: Hematology and Oncology

## 2018-03-31 ENCOUNTER — Other Ambulatory Visit: Payer: Self-pay | Admitting: Physician Assistant

## 2018-04-01 NOTE — Telephone Encounter (Signed)
Please review for refill.  

## 2018-04-02 MED ORDER — NITROGLYCERIN 0.4 MG SL SUBL: SUBLINGUAL_TABLET | SUBLINGUAL | 4 refills | Status: AC

## 2018-04-02 NOTE — Telephone Encounter (Signed)
Ok to fill 

## 2018-04-02 NOTE — Addendum Note (Signed)
Addended by: Jones Broom on: 04/02/2018 09:43 AM   Modules accepted: Orders

## 2018-04-09 DIAGNOSIS — R5383 Other fatigue: Secondary | ICD-10-CM | POA: Diagnosis not present

## 2018-04-09 DIAGNOSIS — Z87891 Personal history of nicotine dependence: Secondary | ICD-10-CM | POA: Diagnosis not present

## 2018-04-09 DIAGNOSIS — R102 Pelvic and perineal pain: Secondary | ICD-10-CM | POA: Diagnosis not present

## 2018-04-09 DIAGNOSIS — Z5111 Encounter for antineoplastic chemotherapy: Secondary | ICD-10-CM | POA: Diagnosis not present

## 2018-04-09 DIAGNOSIS — K56609 Unspecified intestinal obstruction, unspecified as to partial versus complete obstruction: Secondary | ICD-10-CM | POA: Diagnosis not present

## 2018-04-09 DIAGNOSIS — C786 Secondary malignant neoplasm of retroperitoneum and peritoneum: Secondary | ICD-10-CM | POA: Diagnosis not present

## 2018-04-09 DIAGNOSIS — C801 Malignant (primary) neoplasm, unspecified: Secondary | ICD-10-CM | POA: Diagnosis not present

## 2018-04-24 ENCOUNTER — Other Ambulatory Visit: Payer: Self-pay | Admitting: Physician Assistant

## 2018-04-24 NOTE — Telephone Encounter (Signed)
Please advise 

## 2018-04-24 NOTE — Telephone Encounter (Signed)
Called patient regarding changing her provider from [name] to another providr at PCP. No answer, left a message for her to call the office back to schedule and get her refill for fluoxetine 20 mg.

## 2018-05-04 DIAGNOSIS — R918 Other nonspecific abnormal finding of lung field: Secondary | ICD-10-CM | POA: Diagnosis not present

## 2018-05-04 DIAGNOSIS — R978 Other abnormal tumor markers: Secondary | ICD-10-CM | POA: Diagnosis not present

## 2018-05-04 DIAGNOSIS — R600 Localized edema: Secondary | ICD-10-CM | POA: Diagnosis not present

## 2018-05-04 DIAGNOSIS — N281 Cyst of kidney, acquired: Secondary | ICD-10-CM | POA: Diagnosis not present

## 2018-05-04 DIAGNOSIS — Z713 Dietary counseling and surveillance: Secondary | ICD-10-CM | POA: Diagnosis not present

## 2018-05-04 DIAGNOSIS — M25471 Effusion, right ankle: Secondary | ICD-10-CM | POA: Diagnosis not present

## 2018-05-04 DIAGNOSIS — E43 Unspecified severe protein-calorie malnutrition: Secondary | ICD-10-CM | POA: Diagnosis not present

## 2018-05-04 DIAGNOSIS — Z79899 Other long term (current) drug therapy: Secondary | ICD-10-CM | POA: Diagnosis not present

## 2018-05-04 DIAGNOSIS — Z87891 Personal history of nicotine dependence: Secondary | ICD-10-CM | POA: Diagnosis not present

## 2018-05-04 DIAGNOSIS — R590 Localized enlarged lymph nodes: Secondary | ICD-10-CM | POA: Diagnosis not present

## 2018-05-04 DIAGNOSIS — R188 Other ascites: Secondary | ICD-10-CM | POA: Diagnosis not present

## 2018-05-04 DIAGNOSIS — C786 Secondary malignant neoplasm of retroperitoneum and peritoneum: Secondary | ICD-10-CM | POA: Diagnosis not present

## 2018-05-04 DIAGNOSIS — R1084 Generalized abdominal pain: Secondary | ICD-10-CM | POA: Diagnosis not present

## 2018-05-04 DIAGNOSIS — Z8719 Personal history of other diseases of the digestive system: Secondary | ICD-10-CM | POA: Diagnosis not present

## 2018-05-04 DIAGNOSIS — Z681 Body mass index (BMI) 19 or less, adult: Secondary | ICD-10-CM | POA: Diagnosis not present

## 2018-05-04 DIAGNOSIS — C801 Malignant (primary) neoplasm, unspecified: Secondary | ICD-10-CM | POA: Diagnosis not present

## 2018-05-18 DIAGNOSIS — Z955 Presence of coronary angioplasty implant and graft: Secondary | ICD-10-CM | POA: Diagnosis not present

## 2018-05-18 DIAGNOSIS — Z681 Body mass index (BMI) 19 or less, adult: Secondary | ICD-10-CM | POA: Diagnosis not present

## 2018-05-18 DIAGNOSIS — R64 Cachexia: Secondary | ICD-10-CM | POA: Diagnosis not present

## 2018-05-18 DIAGNOSIS — L89152 Pressure ulcer of sacral region, stage 2: Secondary | ICD-10-CM | POA: Diagnosis not present

## 2018-05-18 DIAGNOSIS — C801 Malignant (primary) neoplasm, unspecified: Secondary | ICD-10-CM | POA: Diagnosis not present

## 2018-05-18 DIAGNOSIS — Z452 Encounter for adjustment and management of vascular access device: Secondary | ICD-10-CM | POA: Diagnosis not present

## 2018-05-18 DIAGNOSIS — C786 Secondary malignant neoplasm of retroperitoneum and peritoneum: Secondary | ICD-10-CM | POA: Diagnosis not present

## 2018-05-18 DIAGNOSIS — I2510 Atherosclerotic heart disease of native coronary artery without angina pectoris: Secondary | ICD-10-CM | POA: Diagnosis not present

## 2018-05-19 DIAGNOSIS — C786 Secondary malignant neoplasm of retroperitoneum and peritoneum: Secondary | ICD-10-CM | POA: Diagnosis not present

## 2018-05-19 DIAGNOSIS — Z452 Encounter for adjustment and management of vascular access device: Secondary | ICD-10-CM | POA: Diagnosis not present

## 2018-05-19 DIAGNOSIS — Z681 Body mass index (BMI) 19 or less, adult: Secondary | ICD-10-CM | POA: Diagnosis not present

## 2018-05-19 DIAGNOSIS — C801 Malignant (primary) neoplasm, unspecified: Secondary | ICD-10-CM | POA: Diagnosis not present

## 2018-05-19 DIAGNOSIS — R64 Cachexia: Secondary | ICD-10-CM | POA: Diagnosis not present

## 2018-05-19 DIAGNOSIS — L89152 Pressure ulcer of sacral region, stage 2: Secondary | ICD-10-CM | POA: Diagnosis not present

## 2018-05-20 DIAGNOSIS — L89152 Pressure ulcer of sacral region, stage 2: Secondary | ICD-10-CM | POA: Diagnosis not present

## 2018-05-20 DIAGNOSIS — C801 Malignant (primary) neoplasm, unspecified: Secondary | ICD-10-CM | POA: Diagnosis not present

## 2018-05-20 DIAGNOSIS — Z452 Encounter for adjustment and management of vascular access device: Secondary | ICD-10-CM | POA: Diagnosis not present

## 2018-05-20 DIAGNOSIS — C786 Secondary malignant neoplasm of retroperitoneum and peritoneum: Secondary | ICD-10-CM | POA: Diagnosis not present

## 2018-05-20 DIAGNOSIS — R64 Cachexia: Secondary | ICD-10-CM | POA: Diagnosis not present

## 2018-05-20 DIAGNOSIS — Z681 Body mass index (BMI) 19 or less, adult: Secondary | ICD-10-CM | POA: Diagnosis not present

## 2018-05-23 DIAGNOSIS — L89152 Pressure ulcer of sacral region, stage 2: Secondary | ICD-10-CM | POA: Diagnosis not present

## 2018-05-23 DIAGNOSIS — R64 Cachexia: Secondary | ICD-10-CM | POA: Diagnosis not present

## 2018-05-23 DIAGNOSIS — C786 Secondary malignant neoplasm of retroperitoneum and peritoneum: Secondary | ICD-10-CM | POA: Diagnosis not present

## 2018-05-23 DIAGNOSIS — Z955 Presence of coronary angioplasty implant and graft: Secondary | ICD-10-CM | POA: Diagnosis not present

## 2018-05-23 DIAGNOSIS — C801 Malignant (primary) neoplasm, unspecified: Secondary | ICD-10-CM | POA: Diagnosis not present

## 2018-05-23 DIAGNOSIS — Z452 Encounter for adjustment and management of vascular access device: Secondary | ICD-10-CM | POA: Diagnosis not present

## 2018-05-23 DIAGNOSIS — I2510 Atherosclerotic heart disease of native coronary artery without angina pectoris: Secondary | ICD-10-CM | POA: Diagnosis not present

## 2018-05-23 DIAGNOSIS — Z681 Body mass index (BMI) 19 or less, adult: Secondary | ICD-10-CM | POA: Diagnosis not present

## 2018-05-25 DIAGNOSIS — Z452 Encounter for adjustment and management of vascular access device: Secondary | ICD-10-CM | POA: Diagnosis not present

## 2018-05-25 DIAGNOSIS — L89152 Pressure ulcer of sacral region, stage 2: Secondary | ICD-10-CM | POA: Diagnosis not present

## 2018-05-25 DIAGNOSIS — C786 Secondary malignant neoplasm of retroperitoneum and peritoneum: Secondary | ICD-10-CM | POA: Diagnosis not present

## 2018-05-25 DIAGNOSIS — R64 Cachexia: Secondary | ICD-10-CM | POA: Diagnosis not present

## 2018-05-25 DIAGNOSIS — C801 Malignant (primary) neoplasm, unspecified: Secondary | ICD-10-CM | POA: Diagnosis not present

## 2018-05-25 DIAGNOSIS — Z681 Body mass index (BMI) 19 or less, adult: Secondary | ICD-10-CM | POA: Diagnosis not present

## 2018-05-28 DIAGNOSIS — Z681 Body mass index (BMI) 19 or less, adult: Secondary | ICD-10-CM | POA: Diagnosis not present

## 2018-05-28 DIAGNOSIS — C801 Malignant (primary) neoplasm, unspecified: Secondary | ICD-10-CM | POA: Diagnosis not present

## 2018-05-28 DIAGNOSIS — Z452 Encounter for adjustment and management of vascular access device: Secondary | ICD-10-CM | POA: Diagnosis not present

## 2018-05-28 DIAGNOSIS — C786 Secondary malignant neoplasm of retroperitoneum and peritoneum: Secondary | ICD-10-CM | POA: Diagnosis not present

## 2018-05-28 DIAGNOSIS — R64 Cachexia: Secondary | ICD-10-CM | POA: Diagnosis not present

## 2018-05-28 DIAGNOSIS — L89152 Pressure ulcer of sacral region, stage 2: Secondary | ICD-10-CM | POA: Diagnosis not present

## 2018-06-01 DIAGNOSIS — C786 Secondary malignant neoplasm of retroperitoneum and peritoneum: Secondary | ICD-10-CM | POA: Diagnosis not present

## 2018-06-01 DIAGNOSIS — Z452 Encounter for adjustment and management of vascular access device: Secondary | ICD-10-CM | POA: Diagnosis not present

## 2018-06-01 DIAGNOSIS — Z681 Body mass index (BMI) 19 or less, adult: Secondary | ICD-10-CM | POA: Diagnosis not present

## 2018-06-01 DIAGNOSIS — R64 Cachexia: Secondary | ICD-10-CM | POA: Diagnosis not present

## 2018-06-01 DIAGNOSIS — C801 Malignant (primary) neoplasm, unspecified: Secondary | ICD-10-CM | POA: Diagnosis not present

## 2018-06-01 DIAGNOSIS — L89152 Pressure ulcer of sacral region, stage 2: Secondary | ICD-10-CM | POA: Diagnosis not present

## 2018-06-02 DIAGNOSIS — Z452 Encounter for adjustment and management of vascular access device: Secondary | ICD-10-CM | POA: Diagnosis not present

## 2018-06-02 DIAGNOSIS — C786 Secondary malignant neoplasm of retroperitoneum and peritoneum: Secondary | ICD-10-CM | POA: Diagnosis not present

## 2018-06-02 DIAGNOSIS — R64 Cachexia: Secondary | ICD-10-CM | POA: Diagnosis not present

## 2018-06-02 DIAGNOSIS — Z681 Body mass index (BMI) 19 or less, adult: Secondary | ICD-10-CM | POA: Diagnosis not present

## 2018-06-02 DIAGNOSIS — L89152 Pressure ulcer of sacral region, stage 2: Secondary | ICD-10-CM | POA: Diagnosis not present

## 2018-06-02 DIAGNOSIS — C801 Malignant (primary) neoplasm, unspecified: Secondary | ICD-10-CM | POA: Diagnosis not present

## 2018-06-03 DIAGNOSIS — C786 Secondary malignant neoplasm of retroperitoneum and peritoneum: Secondary | ICD-10-CM | POA: Diagnosis not present

## 2018-06-03 DIAGNOSIS — C801 Malignant (primary) neoplasm, unspecified: Secondary | ICD-10-CM | POA: Diagnosis not present

## 2018-06-03 DIAGNOSIS — Z452 Encounter for adjustment and management of vascular access device: Secondary | ICD-10-CM | POA: Diagnosis not present

## 2018-06-03 DIAGNOSIS — Z681 Body mass index (BMI) 19 or less, adult: Secondary | ICD-10-CM | POA: Diagnosis not present

## 2018-06-03 DIAGNOSIS — R64 Cachexia: Secondary | ICD-10-CM | POA: Diagnosis not present

## 2018-06-03 DIAGNOSIS — L89152 Pressure ulcer of sacral region, stage 2: Secondary | ICD-10-CM | POA: Diagnosis not present

## 2018-06-04 DIAGNOSIS — R64 Cachexia: Secondary | ICD-10-CM | POA: Diagnosis not present

## 2018-06-04 DIAGNOSIS — Z452 Encounter for adjustment and management of vascular access device: Secondary | ICD-10-CM | POA: Diagnosis not present

## 2018-06-04 DIAGNOSIS — L89152 Pressure ulcer of sacral region, stage 2: Secondary | ICD-10-CM | POA: Diagnosis not present

## 2018-06-04 DIAGNOSIS — C786 Secondary malignant neoplasm of retroperitoneum and peritoneum: Secondary | ICD-10-CM | POA: Diagnosis not present

## 2018-06-04 DIAGNOSIS — C801 Malignant (primary) neoplasm, unspecified: Secondary | ICD-10-CM | POA: Diagnosis not present

## 2018-06-04 DIAGNOSIS — Z681 Body mass index (BMI) 19 or less, adult: Secondary | ICD-10-CM | POA: Diagnosis not present

## 2018-06-05 DIAGNOSIS — C801 Malignant (primary) neoplasm, unspecified: Secondary | ICD-10-CM | POA: Diagnosis not present

## 2018-06-05 DIAGNOSIS — Z452 Encounter for adjustment and management of vascular access device: Secondary | ICD-10-CM | POA: Diagnosis not present

## 2018-06-05 DIAGNOSIS — C786 Secondary malignant neoplasm of retroperitoneum and peritoneum: Secondary | ICD-10-CM | POA: Diagnosis not present

## 2018-06-05 DIAGNOSIS — R64 Cachexia: Secondary | ICD-10-CM | POA: Diagnosis not present

## 2018-06-05 DIAGNOSIS — L89152 Pressure ulcer of sacral region, stage 2: Secondary | ICD-10-CM | POA: Diagnosis not present

## 2018-06-05 DIAGNOSIS — Z681 Body mass index (BMI) 19 or less, adult: Secondary | ICD-10-CM | POA: Diagnosis not present

## 2018-06-06 DIAGNOSIS — C801 Malignant (primary) neoplasm, unspecified: Secondary | ICD-10-CM | POA: Diagnosis not present

## 2018-06-06 DIAGNOSIS — Z681 Body mass index (BMI) 19 or less, adult: Secondary | ICD-10-CM | POA: Diagnosis not present

## 2018-06-06 DIAGNOSIS — L89152 Pressure ulcer of sacral region, stage 2: Secondary | ICD-10-CM | POA: Diagnosis not present

## 2018-06-06 DIAGNOSIS — C786 Secondary malignant neoplasm of retroperitoneum and peritoneum: Secondary | ICD-10-CM | POA: Diagnosis not present

## 2018-06-06 DIAGNOSIS — R64 Cachexia: Secondary | ICD-10-CM | POA: Diagnosis not present

## 2018-06-06 DIAGNOSIS — Z452 Encounter for adjustment and management of vascular access device: Secondary | ICD-10-CM | POA: Diagnosis not present

## 2018-06-21 DEATH — deceased

## 2018-07-21 ENCOUNTER — Other Ambulatory Visit: Payer: Self-pay | Admitting: Physician Assistant

## 2018-07-21 NOTE — Telephone Encounter (Signed)
Will Dr. Pamella Pert be taking over this rx?

## 2018-09-21 ENCOUNTER — Encounter: Payer: Self-pay | Admitting: Hematology and Oncology

## 2020-01-15 IMAGING — DX DG ABDOMEN 1V
1 series · 1 of 1 positions shown · non-contrast
Comparison: CT abdomen pelvis 03/22/2017

CLINICAL DATA: Lower abdominal pain

EXAM:
ABDOMEN - 1 VIEW

[abdomen kub]
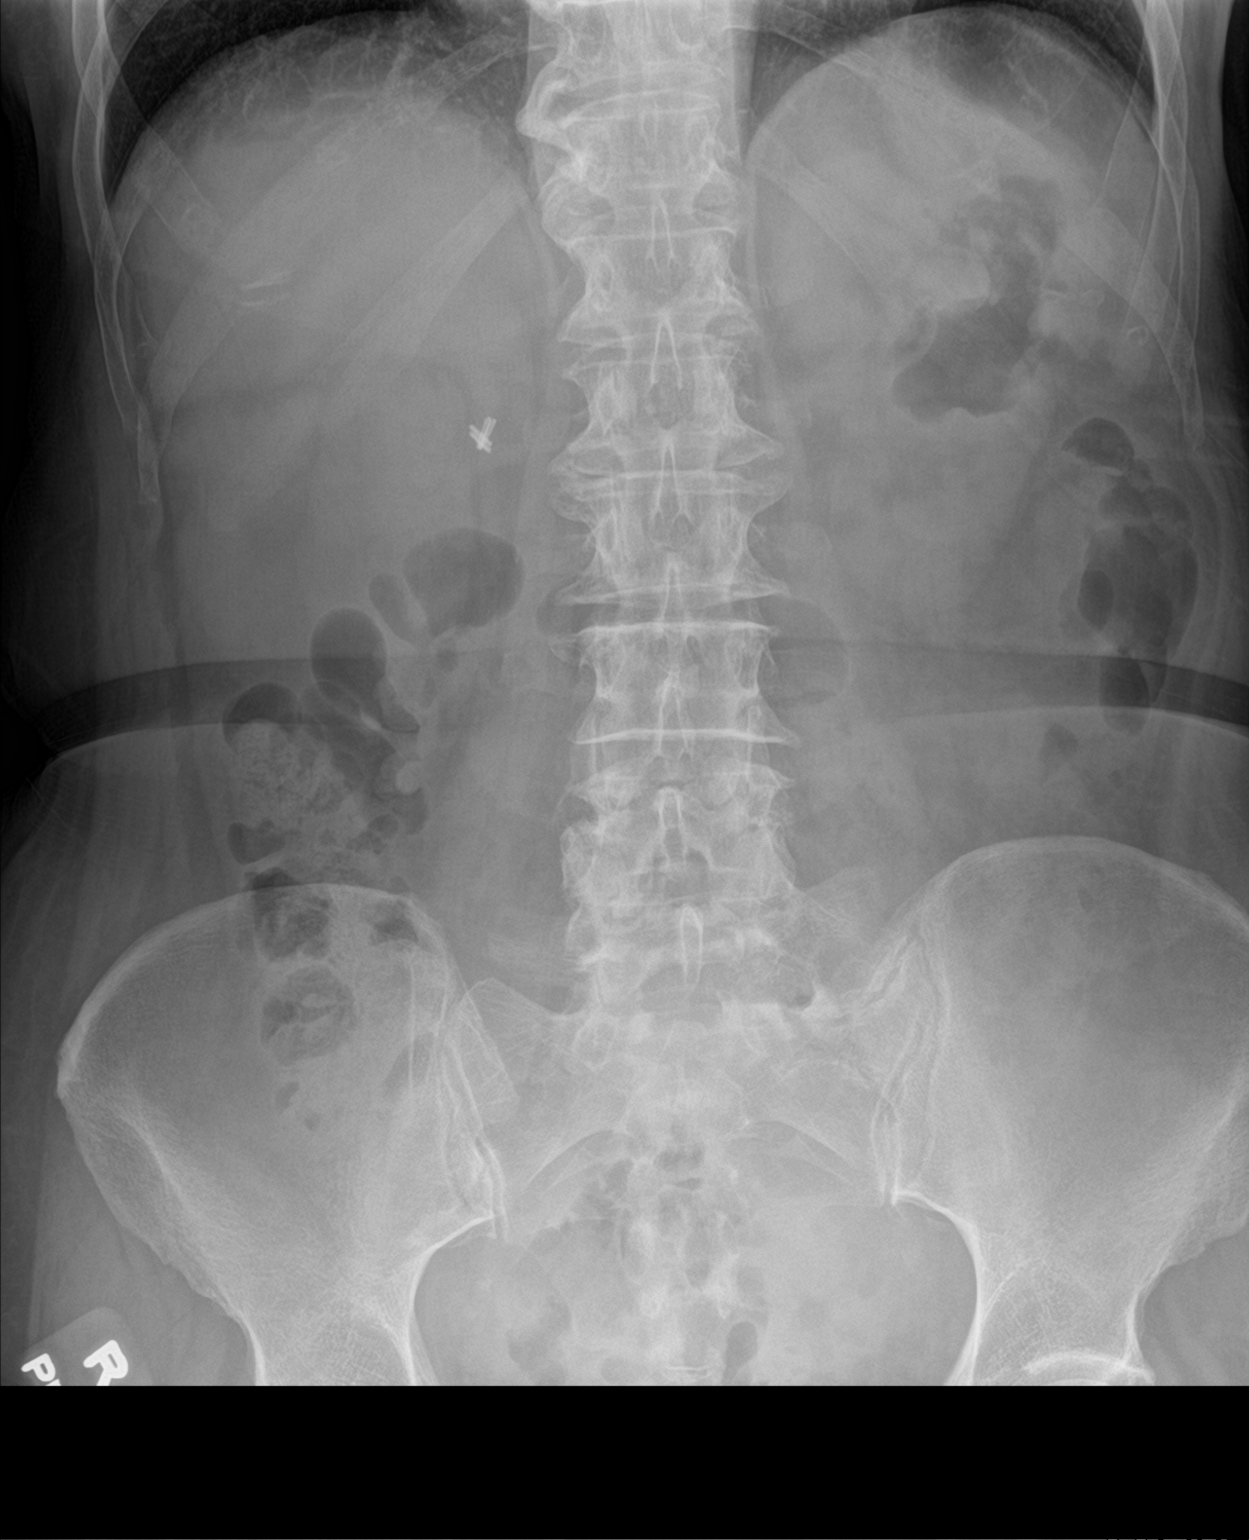

[1 of 1 positions shown; findings below may reference images not displayed]

FINDINGS: The bowel gas pattern is normal. No radio-opaque calculi or other
significant radiographic abnormality are seen. Cholecystectomy
clips. Mild degenerative change in the lumbar spine.
IMPRESSION: Negative.

## 2020-02-07 IMAGING — CT CT ABD-PELV W/ CM
2 of 5 series · 16 of 46 positions shown, 18 images · IV contrast (ISOVUE 300)
Comparison: 03/22/2017

CLINICAL DATA: Generalized abd pain, mild to severe at times since
heart attack in September 2016. Recent ovarian cyst seen on US. No other
symptoms.

EXAM:
CT ABDOMEN AND PELVIS WITH CONTRAST
TECHNIQUE: Multidetector CT imaging of the abdomen and pelvis was performed
using the standard protocol following bolus administration of
intravenous contrast.
CONTRAST:  100mL 3B3OFA-GJJ IOPAMIDOL (3B3OFA-GJJ) INJECTION 61%

[Series 2: abd/pel w · axial · 0.67mm/px · z∈[-424,-68]mm · 13 of 81 slices shown, 15 images]
[im 5/81  soft-tissue]
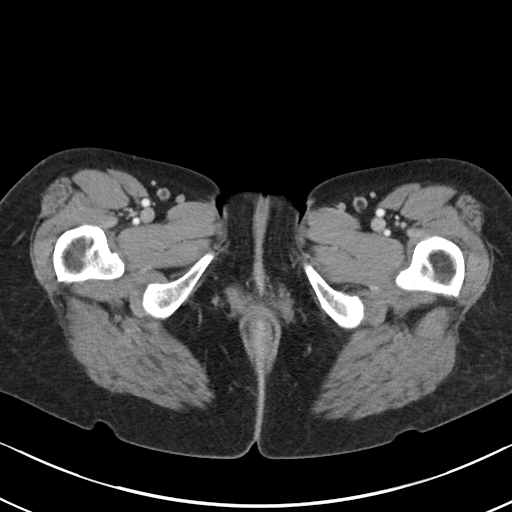
[im 5/81  bone]
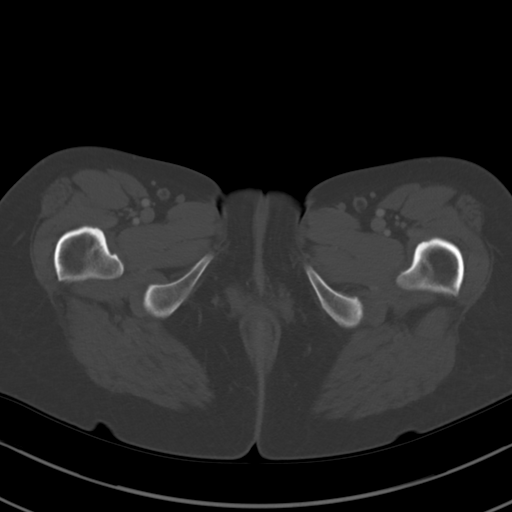
[im 13/81  soft-tissue]
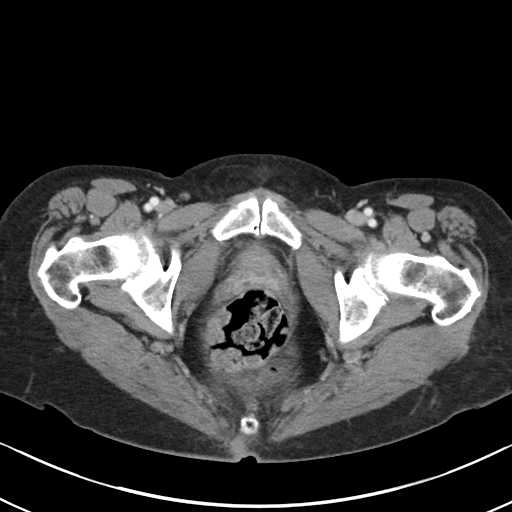
[im 17/81  soft-tissue]
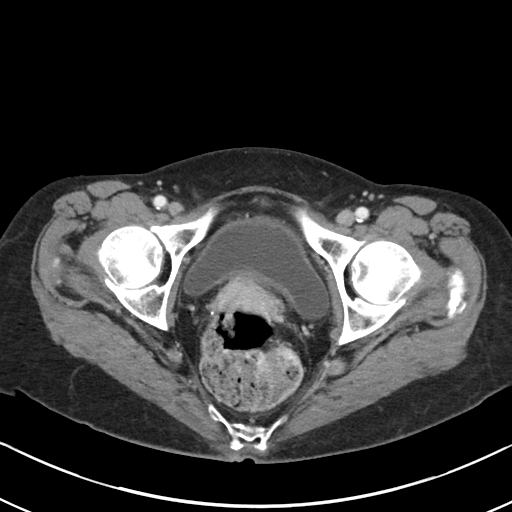
[im 22/81  soft-tissue]
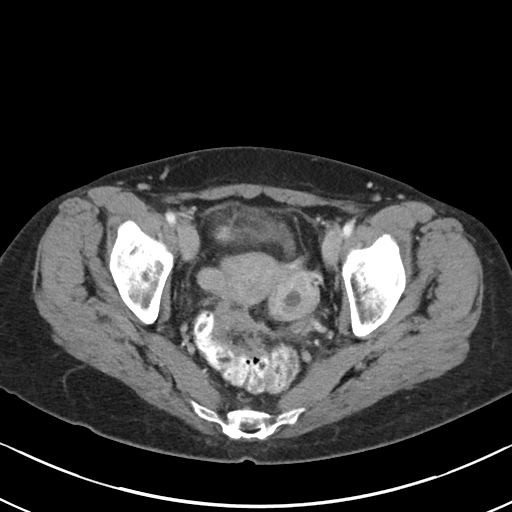
[im 30/81  soft-tissue]
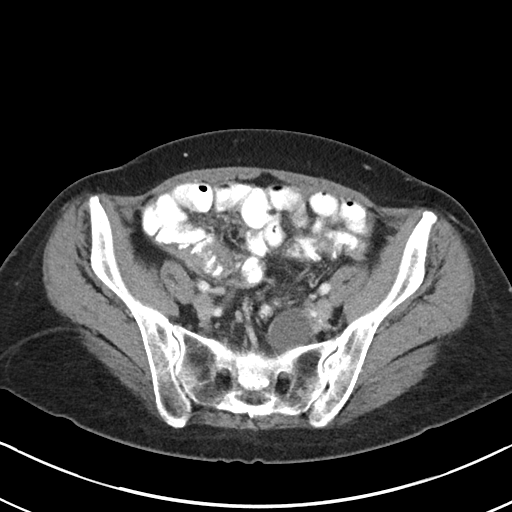
[im 34/81  soft-tissue]
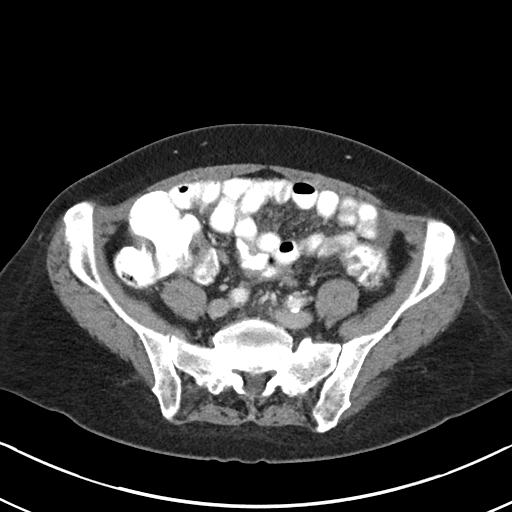
[im 43/81  soft-tissue]
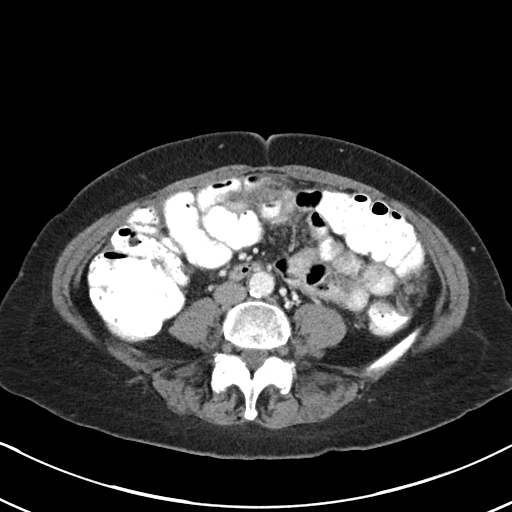
[im 47/81  soft-tissue]
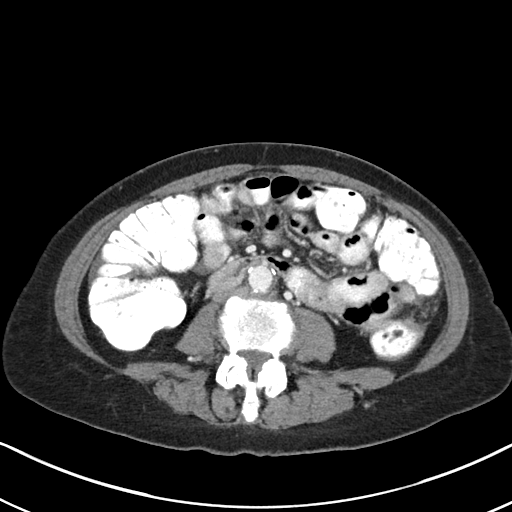
[im 51/81  soft-tissue]
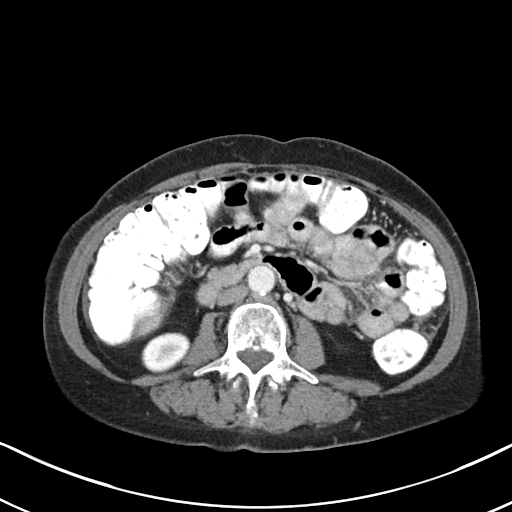
[im 51/81  bone]
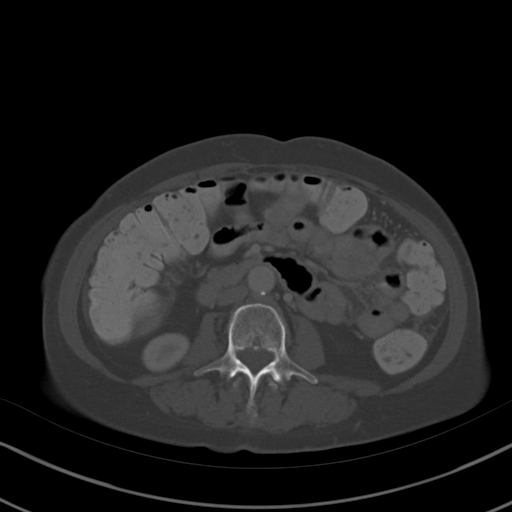
[im 59/81  soft-tissue]
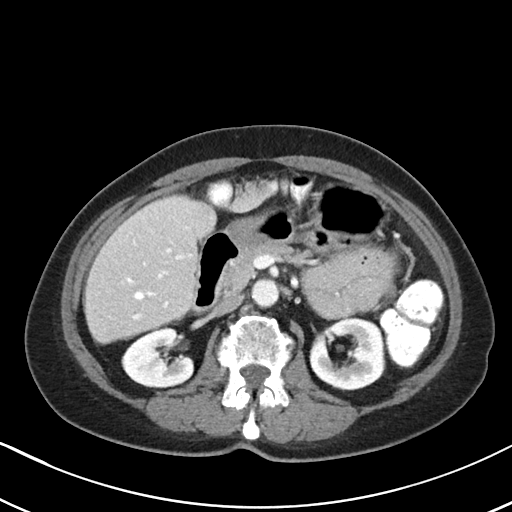
[im 64/81  soft-tissue]
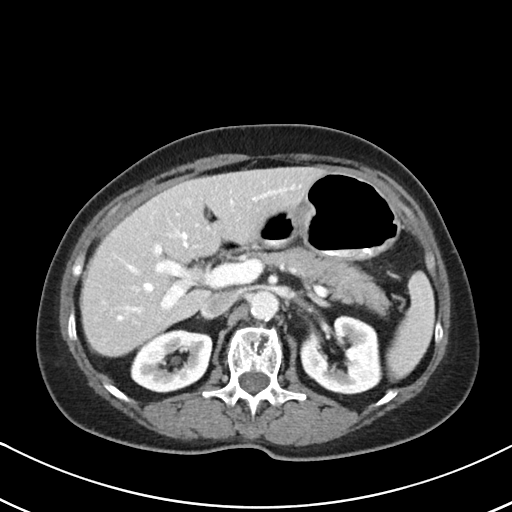
[im 68/81  soft-tissue]
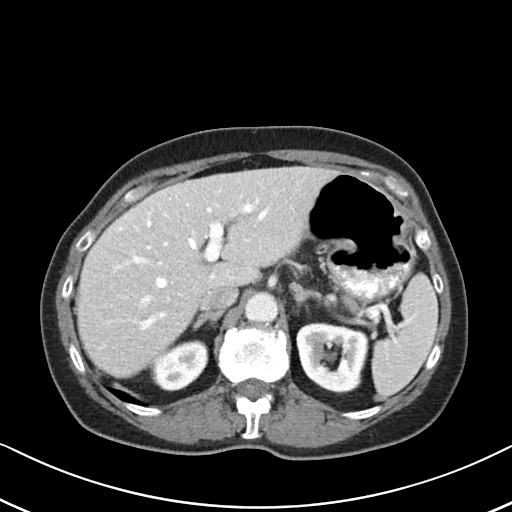
[im 76/81  soft-tissue]
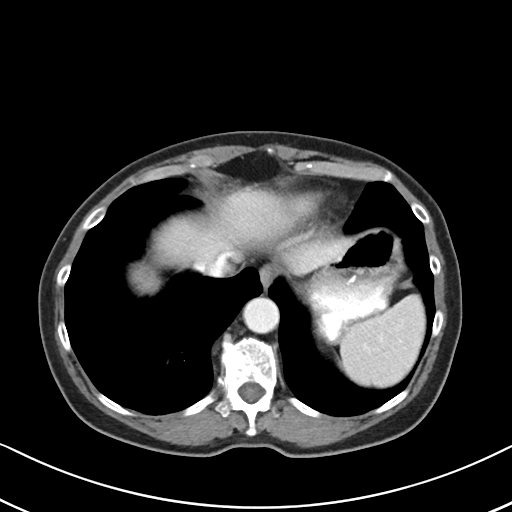

[Series 6: abd/pel w st · coronal · 0.63mm/px · 3 of 75 slices shown]
[im 25/75  soft-tissue]
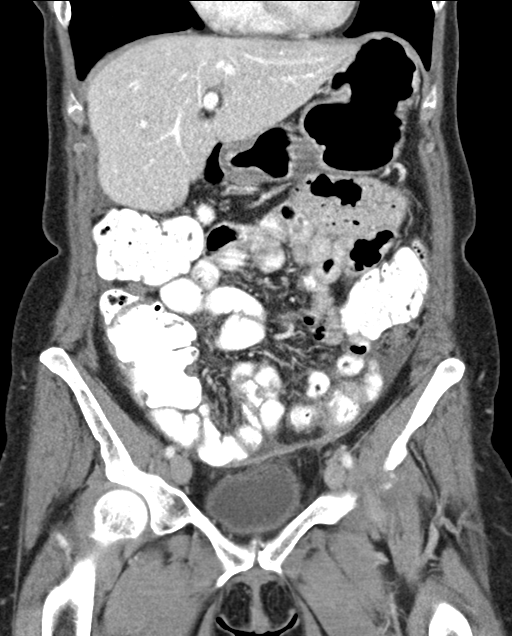
[im 33/75  soft-tissue]
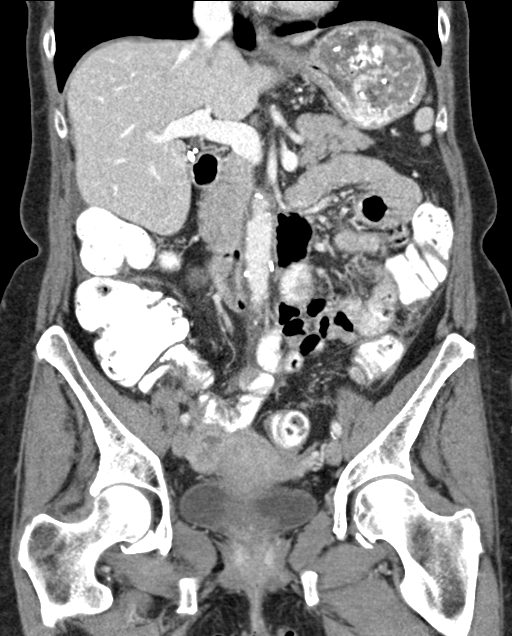
[im 42/75  soft-tissue]
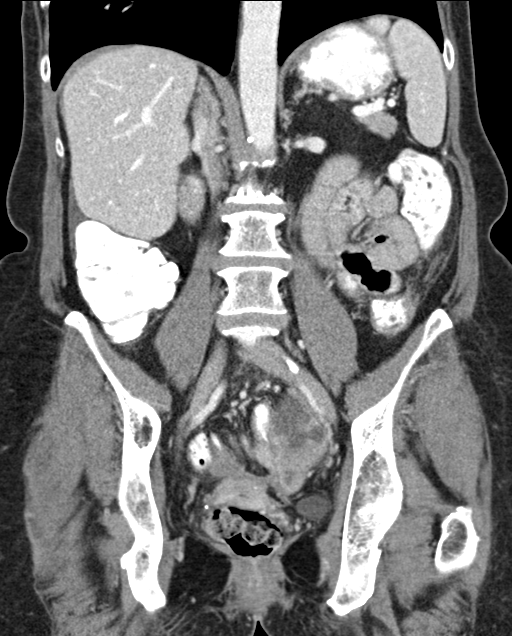

[16 of 46 positions shown; findings below may reference images not displayed]

FINDINGS: Lower chest: No acute abnormality.

Hepatobiliary: No focal liver abnormality is seen. Status post
cholecystectomy. No biliary dilatation.

Pancreas: Unremarkable. No pancreatic ductal dilatation or
surrounding inflammatory changes.

Spleen: Normal in size without focal abnormality.

Adrenals/Urinary Tract: Normal adrenals. Stable left upper pole
renal cyst. No solid renal mass. No hydronephrosis. Urinary bladder
incompletely distended.

Stomach/Bowel: Stomach is incompletely distended. Small bowel is
nondilated. Colon and rectum physiologically distended.

Vascular/Lymphatic: Scattered aortoiliac atheromatous calcifications
without aneurysm. Portal vein patent. No abdominal or pelvic
adenopathy.

Reproductive: Uterus and right adnexal region unremarkable. 7.4 cm
mixed solid/cystic left adnexal mass, new since prior exam.

Other: There is a small amount of perihepatic and lower peritoneal
ascites, slightly increased since prior. Progressive omental
nodularity in the left lower quadrant and anterior right mid
abdomen.

No free air.

Musculoskeletal: Facet DJD L4-5 allowing early grade 1
anterolisthesis. No fracture or worrisome bone lesion.
IMPRESSION: 1. New 7.4 cm left adnexal mass with worsening abdominal ascites and
omental nodularity, suggesting metastatic ovarian carcinoma.
Consider Gyn-Onc consultation.
